# Patient Record
Sex: Male | Born: 1940
Health system: Southern US, Community
[De-identification: ages and names within clinical notes are randomized; demographics above are authoritative.]

## PROBLEM LIST (undated history)

## (undated) DIAGNOSIS — I10 Essential (primary) hypertension: Secondary | ICD-10-CM

## (undated) DIAGNOSIS — C44310 Basal cell carcinoma of skin of unspecified parts of face: Secondary | ICD-10-CM

## (undated) DIAGNOSIS — Z125 Encounter for screening for malignant neoplasm of prostate: Secondary | ICD-10-CM

## (undated) DIAGNOSIS — H409 Unspecified glaucoma: Secondary | ICD-10-CM

## (undated) DIAGNOSIS — D649 Anemia, unspecified: Secondary | ICD-10-CM

## (undated) DIAGNOSIS — I2510 Atherosclerotic heart disease of native coronary artery without angina pectoris: Secondary | ICD-10-CM

## (undated) DIAGNOSIS — N529 Male erectile dysfunction, unspecified: Secondary | ICD-10-CM

## (undated) DIAGNOSIS — Z1211 Encounter for screening for malignant neoplasm of colon: Secondary | ICD-10-CM

## (undated) DIAGNOSIS — I779 Disorder of arteries and arterioles, unspecified: Secondary | ICD-10-CM

## (undated) DIAGNOSIS — E785 Hyperlipidemia, unspecified: Secondary | ICD-10-CM

## (undated) DIAGNOSIS — C760 Malignant neoplasm of head, face and neck: Secondary | ICD-10-CM

## (undated) DIAGNOSIS — I350 Nonrheumatic aortic (valve) stenosis: Secondary | ICD-10-CM

## (undated) DIAGNOSIS — H269 Unspecified cataract: Secondary | ICD-10-CM

## (undated) DIAGNOSIS — I5189 Other ill-defined heart diseases: Secondary | ICD-10-CM

## (undated) DIAGNOSIS — E781 Pure hyperglyceridemia: Secondary | ICD-10-CM

## (undated) DIAGNOSIS — E291 Testicular hypofunction: Secondary | ICD-10-CM

## (undated) DIAGNOSIS — E119 Type 2 diabetes mellitus without complications: Secondary | ICD-10-CM

## (undated) DIAGNOSIS — E213 Hyperparathyroidism, unspecified: Secondary | ICD-10-CM

## (undated) HISTORY — DX: Hypercalcemia: E83.52

## (undated) HISTORY — DX: Unspecified cataract: H26.9

## (undated) HISTORY — DX: Atherosclerotic heart disease of native coronary artery without angina pectoris: I25.10

## (undated) HISTORY — DX: Basal cell carcinoma of skin of unspecified parts of face: C44.310

## (undated) HISTORY — DX: Hyperlipidemia, unspecified: E78.5

## (undated) HISTORY — DX: Encounter for screening for malignant neoplasm of prostate: Z12.5

## (undated) HISTORY — DX: Testicular hypofunction: E29.1

## (undated) HISTORY — DX: Other ill-defined heart diseases: I51.89

## (undated) HISTORY — DX: Anemia, unspecified: D64.9

## (undated) HISTORY — DX: Type 2 diabetes mellitus without complications: E11.9

## (undated) HISTORY — DX: Disorder of arteries and arterioles, unspecified: I77.9

## (undated) HISTORY — PX: OTHER SURGICAL HISTORY: SHX169

## (undated) HISTORY — DX: Malignant neoplasm of head, face and neck: C76.0

## (undated) HISTORY — DX: Pure hyperglyceridemia: E78.1

## (undated) HISTORY — DX: Male erectile dysfunction, unspecified: N52.9

## (undated) HISTORY — DX: Hyperparathyroidism, unspecified: E21.3

## (undated) HISTORY — DX: Encounter for screening for malignant neoplasm of colon: Z12.11

## (undated) HISTORY — DX: Nonrheumatic aortic (valve) stenosis: I35.0

## (undated) HISTORY — DX: Essential (primary) hypertension: I10

## (undated) HISTORY — DX: Unspecified glaucoma: H40.9

---

## 2006-06-12 HISTORY — PX: TRIGGER FINGER RELEASE: SHX641

## 2007-07-03 ENCOUNTER — Ambulatory Visit: Payer: Self-pay | Admitting: Family Medicine

## 2007-07-03 DIAGNOSIS — I10 Essential (primary) hypertension: Secondary | ICD-10-CM | POA: Insufficient documentation

## 2007-07-03 DIAGNOSIS — C44309 Unspecified malignant neoplasm of skin of other parts of face: Secondary | ICD-10-CM | POA: Insufficient documentation

## 2007-07-03 DIAGNOSIS — E1159 Type 2 diabetes mellitus with other circulatory complications: Secondary | ICD-10-CM | POA: Insufficient documentation

## 2007-07-03 DIAGNOSIS — I152 Hypertension secondary to endocrine disorders: Secondary | ICD-10-CM | POA: Insufficient documentation

## 2007-07-04 ENCOUNTER — Ambulatory Visit: Payer: Self-pay | Admitting: Family Medicine

## 2007-07-04 LAB — CONVERTED CEMR LAB
Blood in Urine, dipstick: NEGATIVE
Nitrite: NEGATIVE
WBC Urine, dipstick: NEGATIVE

## 2007-07-05 DIAGNOSIS — E7800 Pure hypercholesterolemia, unspecified: Secondary | ICD-10-CM | POA: Insufficient documentation

## 2007-07-05 DIAGNOSIS — E1169 Type 2 diabetes mellitus with other specified complication: Secondary | ICD-10-CM | POA: Insufficient documentation

## 2007-07-05 LAB — CONVERTED CEMR LAB
Albumin: 3.6 g/dL (ref 3.5–5.2)
Alkaline Phosphatase: 105 units/L (ref 39–117)
BUN: 11 mg/dL (ref 6–23)
Direct LDL: 121.1 mg/dL
GFR calc Af Amer: 71 mL/min
LDL Cholesterol: 113 mg/dL — ABNORMAL HIGH (ref 0–99)
PSA: 1.02 ng/mL (ref 0.10–4.00)
Potassium: 4.6 meq/L (ref 3.5–5.1)
Total CHOL/HDL Ratio: 7.2
Total Protein: 6.9 g/dL (ref 6.0–8.3)
Triglycerides: 357 mg/dL (ref 0–149)
VLDL: 71 mg/dL — ABNORMAL HIGH (ref 0–40)

## 2007-07-17 ENCOUNTER — Ambulatory Visit: Payer: Self-pay | Admitting: Family Medicine

## 2007-08-14 ENCOUNTER — Ambulatory Visit: Payer: Self-pay | Admitting: Family Medicine

## 2007-10-03 ENCOUNTER — Ambulatory Visit: Payer: Self-pay | Admitting: Family Medicine

## 2007-10-08 LAB — CONVERTED CEMR LAB
Cholesterol: 246 mg/dL (ref 0–200)
Direct LDL: 153.6 mg/dL
Triglycerides: 435 mg/dL (ref 0–149)

## 2007-10-10 ENCOUNTER — Telehealth: Payer: Self-pay | Admitting: Family Medicine

## 2008-01-07 ENCOUNTER — Ambulatory Visit: Payer: Self-pay | Admitting: Family Medicine

## 2008-01-08 LAB — CONVERTED CEMR LAB
ALT: 25 units/L (ref 0–53)
HDL: 31 mg/dL — ABNORMAL LOW (ref >39.0)
VLDL: 66 mg/dL — ABNORMAL HIGH (ref 0–40)

## 2008-02-12 ENCOUNTER — Ambulatory Visit: Payer: Self-pay | Admitting: Family Medicine

## 2008-02-21 ENCOUNTER — Ambulatory Visit: Payer: Self-pay | Admitting: Gastroenterology

## 2008-03-06 ENCOUNTER — Ambulatory Visit: Payer: Self-pay | Admitting: Gastroenterology

## 2008-03-06 ENCOUNTER — Encounter: Payer: Self-pay | Admitting: Gastroenterology

## 2008-03-06 LAB — HM COLONOSCOPY

## 2008-03-09 ENCOUNTER — Encounter: Payer: Self-pay | Admitting: Gastroenterology

## 2008-04-09 ENCOUNTER — Ambulatory Visit: Payer: Self-pay | Admitting: Family Medicine

## 2008-04-13 ENCOUNTER — Telehealth: Payer: Self-pay | Admitting: Family Medicine

## 2008-04-14 LAB — CONVERTED CEMR LAB
Direct LDL: 99.1 mg/dL
HDL: 31.9 mg/dL — ABNORMAL LOW (ref >39.0)
Total CHOL/HDL Ratio: 5.3

## 2008-07-21 ENCOUNTER — Ambulatory Visit: Payer: Self-pay | Admitting: Family Medicine

## 2008-07-23 ENCOUNTER — Ambulatory Visit: Payer: Self-pay | Admitting: Family Medicine

## 2008-07-27 ENCOUNTER — Telehealth: Payer: Self-pay | Admitting: Family Medicine

## 2008-07-30 ENCOUNTER — Encounter: Payer: Self-pay | Admitting: Family Medicine

## 2008-07-30 LAB — CONVERTED CEMR LAB
Cholesterol: 205 mg/dL (ref 0–200)
Direct LDL: 134.2 mg/dL
PSA: 0.64 ng/mL (ref 0.10–4.00)
VLDL: 44 mg/dL — ABNORMAL HIGH (ref 0–40)

## 2008-10-27 ENCOUNTER — Ambulatory Visit: Payer: Self-pay | Admitting: Family Medicine

## 2008-11-02 LAB — CONVERTED CEMR LAB
Cholesterol: 224 mg/dL — ABNORMAL HIGH (ref 0–200)
HDL: 30.6 mg/dL — ABNORMAL LOW (ref >39.00)
Triglycerides: 327 mg/dL — ABNORMAL HIGH (ref 0.0–149.0)

## 2009-02-03 ENCOUNTER — Ambulatory Visit: Payer: Self-pay | Admitting: Family Medicine

## 2009-02-06 LAB — CONVERTED CEMR LAB
ALT: 24 units/L (ref 0–53)
HDL: 31.4 mg/dL — ABNORMAL LOW (ref >39.00)
Total CHOL/HDL Ratio: 5
Triglycerides: 247 mg/dL — ABNORMAL HIGH (ref 0.0–149.0)
VLDL: 49.4 mg/dL — ABNORMAL HIGH (ref 0.0–40.0)

## 2009-02-11 ENCOUNTER — Encounter (INDEPENDENT_AMBULATORY_CARE_PROVIDER_SITE_OTHER): Payer: Self-pay | Admitting: *Deleted

## 2009-08-19 ENCOUNTER — Ambulatory Visit: Payer: Self-pay | Admitting: Family Medicine

## 2009-08-25 LAB — CONVERTED CEMR LAB
AST: 24 units/L (ref 0–37)
LDL Cholesterol: 77 mg/dL (ref 0–99)
Total CHOL/HDL Ratio: 4

## 2009-11-16 ENCOUNTER — Ambulatory Visit: Payer: Self-pay | Admitting: Family Medicine

## 2009-11-16 DIAGNOSIS — N529 Male erectile dysfunction, unspecified: Secondary | ICD-10-CM | POA: Insufficient documentation

## 2009-11-17 DIAGNOSIS — E291 Testicular hypofunction: Secondary | ICD-10-CM | POA: Insufficient documentation

## 2009-11-17 LAB — CONVERTED CEMR LAB
BUN: 19 mg/dL (ref 6–23)
Basophils Relative: 0.6 % (ref 0.0–3.0)
Calcium: 11.1 mg/dL — ABNORMAL HIGH (ref 8.4–10.5)
Creatinine, Ser: 1.4 mg/dL (ref 0.4–1.5)
Eosinophils Absolute: 0.3 10*3/uL (ref 0.0–0.7)
Eosinophils Relative: 2.9 % (ref 0.0–5.0)
Lymphocytes Relative: 25.5 % (ref 12.0–46.0)
MCHC: 34.5 g/dL (ref 30.0–36.0)
Neutrophils Relative %: 62.4 % (ref 43.0–77.0)
PSA: 0.66 ng/mL (ref 0.10–4.00)
Platelets: 308 10*3/uL (ref 150.0–400.0)
RBC: 5.04 M/uL (ref 4.22–5.81)
Sex Hormone Binding: 36 nmol/L (ref 13–71)
TSH: 0.85 microintl units/mL (ref 0.35–5.50)
Testosterone: 225.43 ng/dL — ABNORMAL LOW (ref 350–890)
WBC: 8.6 10*3/uL (ref 4.5–10.5)

## 2009-11-23 ENCOUNTER — Ambulatory Visit: Payer: Self-pay | Admitting: Family Medicine

## 2009-11-23 ENCOUNTER — Encounter: Payer: Self-pay | Admitting: Family Medicine

## 2009-11-25 DIAGNOSIS — E213 Hyperparathyroidism, unspecified: Secondary | ICD-10-CM | POA: Insufficient documentation

## 2009-11-25 LAB — CONVERTED CEMR LAB
Calcium, Total (PTH): 11.1 mg/dL — ABNORMAL HIGH (ref 8.4–10.5)
LH: 3.29 milliintl units/mL (ref 1.50–9.30)
PTH: 133.6 pg/mL — ABNORMAL HIGH (ref 14.0–72.0)
Prolactin: 6.4 ng/mL

## 2009-12-06 ENCOUNTER — Encounter: Payer: Self-pay | Admitting: Family Medicine

## 2010-02-01 ENCOUNTER — Encounter: Payer: Self-pay | Admitting: Family Medicine

## 2010-03-30 ENCOUNTER — Encounter: Payer: Self-pay | Admitting: Family Medicine

## 2010-04-04 ENCOUNTER — Ambulatory Visit: Payer: Self-pay

## 2010-06-12 HISTORY — PX: PARATHYROIDECTOMY: SHX19

## 2010-07-12 NOTE — Consult Note (Signed)
Summary: [facility] Endocrinology  [facility] Endocrinology   Imported By: Lanelle Bal 12/17/2009 11:35:14  _____________________________________________________________________  External Attachment:    Type:   Image     Comment:   External Document

## 2010-07-12 NOTE — Letter (Signed)
Summary: [facility] Endocrinology  [facility] Endocrinology   Imported By: Lanelle Bal 04/11/2010 11:40:47  _____________________________________________________________________  External Attachment:    Type:   Image     Comment:   External Document

## 2010-07-12 NOTE — Consult Note (Signed)
Summary: [facility] Endocrinology  [facility] Endocrinology   Imported By: Lanelle Bal 02/10/2010 12:16:30  _____________________________________________________________________  External Attachment:    Type:   Image     Comment:   External Document

## 2010-07-12 NOTE — Assessment & Plan Note (Signed)
Summary: ANNUAL PHYSICAL/JRR   Vital Signs:  Patient profile:   70 year old male Height:      70 inches Weight:      230.6 pounds BMI:     33.21 Temp:     98.0 degrees F oral Pulse rate:   80 / minute Pulse rhythm:   regular BP sitting:   120 / 70  (left arm) Cuff size:   large  Vitals Entered By: Benny Lennert CMA Duncan Dull) (November 16, 2009 1:58 PM)  History of Present Illness: Chief complaint Chronic Medical Issue Maintanance  Minimal exercise, moderate diet. Drinking a lot of soda. Does not like veggies.   Hypertension History:      He denies headache, chest pain, palpitations, dyspnea with exertion, peripheral edema, neurologic problems, syncope, and side effects from treatment.  Well controlled at home. Marland Kitchen        Positive major cardiovascular risk factors include male age 18 years old or older, hyperlipidemia, and hypertension.  Negative major cardiovascular risk factors include non-tobacco-user status.     Problems Prior to Update: 1)  Erectile Dysfunction, Organic  (ICD-607.84) 2)  Special Screening For Malignant Neoplasms Colon  (ICD-V76.51) 3)  Hypertriglyceridemia  (ICD-272.1) 4)  Special Screening Malignant Neoplasm of Prostate  (ICD-V76.44) 5)  Carcinoma, Basal Cell, Face  (ICD-173.3) 6)  Hypertension  (ICD-401.9)  Current Medications (verified): 1)  Fish Oil Concentrate 1000 Mg  Caps (Omega-3 Fatty Acids) .... Takes 1200 Mg. - Two Capsules By Mouth Two Times A Day 2)  Hydrochlorothiazide 25 Mg  Tabs (Hydrochlorothiazide) .... Take 1 Tablet By Mouth Once A Day 3)  Simvastatin 80 Mg Tabs (Simvastatin) .... Take 1 Tablet By Mouth Once A Day 4)  Fenofibrate 160 Mg Tabs (Fenofibrate) .... Take 1 Tablet By Mouth Once A Day  Allergies: 1)  ! Codeine  Past History:  Past medical, surgical, family and social histories (including risk factors) reviewed for relevance to current acute and chronic problems.  Past Medical History: Reviewed history from 02/12/2008 and no  changes required. HYPERTRIGLYCERIDEMIA (ICD-272.1) HYPERTENSION (ICD-401.9)    Past Surgical History: Reviewed history from 07/03/2007 and no changes required. skin cancer removed from face by Dr. Jarold Motto  Family History: Reviewed history from 07/03/2007 and no changes required. father no contact , died age 46 ? mother HTN, dementia aunt DM ? grandparents health 3 brothers HTN, chol sister HTN, chol no cancer  Social History: Reviewed history from 07/03/2007 and no changes required. Occupation:purchasing agent at [employer] Married 40 years 1 daughter healthy Former Smoker 25 pyr hx Alcohol use-yes, 1 beer every few weeks Drug use-no Regular exercise-yes, plays golf Diet; loves meat and potatos, no veggies, occ fruit  Review of Systems       Some dry mouth. General:  Denies fatigue and fever. ENT:  Area on top of right lower lip noted by hygenist.Pt already notified per report.  to have MD eval. . CV:  Denies chest pain or discomfort. Resp:  Denies shortness of breath, sputum productive, and wheezing. GI:  Denies abdominal pain, bloody stools, constipation, and diarrhea. GU:  Complains of erectile dysfunction and urinary frequency; denies decreased libido, dysuria, hematuria, nocturia, and urinary hesitancy. Derm:  Denies rash. Psych:  Denies anxiety and depression.  Physical Exam  General:  obese appearing male inNAd Eyes:  No corneal or conjunctival inflammation noted. EOMI. Perrla. Funduscopic exam benign, without hemorrhages, exudates or papilledema. Vision grossly normal. Ears:  External ear exam shows no significant lesions or deformities.  Otoscopic examination reveals clear canals, tympanic membranes are intact bilaterally without bulging, retraction, inflammation or discharge. Hearing is grossly normal bilaterally. Nose:  External nasal examination shows no deformity or inflammation. Nasal mucosa are pink and moist without lesions or exudates. Mouth:  Oral mucosa  and oropharynx without lesions or exudates.  Teeth in good repair. Neck:  no carotid bruit or thyromegaly no cervical or supraclavicular lymphadenopathy  Lungs:  Normal respiratory effort, chest expands symmetrically. Lungs are clear to auscultation, no crackles or wheezes. Heart:  Normal rate and regular rhythm. S1 and S2 normal without gallop, murmur, click, rub or other extra sounds. Abdomen:  Bowel sounds positive,abdomen soft and non-tender without masses, organomegaly or hernias noted. Rectal:  No external abnormalities noted. Normal sphincter tone. No rectal masses or tenderness. Genitalia:  Testes bilaterally descended without nodularity, tenderness or masses. No scrotal masses or lesions. No penis lesions or urethral discharge. Prostate:  Prostate gland firm and smooth, no enlargement, nodularity, tenderness, mass, asymmetry or induration. Msk:  No deformity or scoliosis noted of thoracic or lumbar spine.   Pulses:  R and L posterior tibial pulses are full and equal bilaterally  Extremities:  no edema Neurologic:  No cranial nerve deficits noted. Station and gait are normal. Plantar reflexes are down-going bilaterally. DTRs are symmetrical throughout. Sensory, motor and coordinative functions appear intact. Skin:  Intact without suspicious lesions or rashes Psych:  Cognition and judgment appear intact. Alert and cooperative with normal attention span and concentration. No apparent delusions, illusions, hallucinations   Impression & Recommendations:  Problem # 1:  ERECTILE DYSFUNCTION, ORGANIC (ICD-607.84) Will look into lab eval..no meds causing SE...consider cialis/viagra to treat.  No obvious testicular hypofunction on testicular exam.   Orders: TLB-CBC Platelet - w/Differential (85025-CBCD) TLB-TSH (Thyroid Stimulating Hormone) (84443-TSH) T-Testosterone, Free and Total [phone]) Specimen Handling (19147)  Problem # 2:  HYPERTRIGLYCERIDEMIA (ICD-272.1)  Improved on  current medicaiton. Encouraged exercise, weight loss, healthy eating habits.  His updated medication list for this problem includes:    Simvastatin 80 Mg Tabs (Simvastatin) .Marland Kitchen... Take 1 tablet by mouth once a day    Fenofibrate 160 Mg Tabs (Fenofibrate) .Marland Kitchen... Take 1 tablet by mouth once a day  Labs Reviewed: SGOT: 24 (08/19/2009)   SGPT: 26 (08/19/2009)  Prior 10 Yr Risk Heart Disease: 33 % (08/14/2007)   HDL:41.30 (08/19/2009), 31.40 (02/03/2009)  LDL:77 (08/19/2009), DEL (82/95/6213)  Chol:155 (08/19/2009), 164 (02/03/2009)  Trig:185.0 (08/19/2009), 247.0 (02/03/2009)  Problem # 3:  HYPERTENSION (ICD-401.9)  Well controlled. Continue current medication.  His updated medication list for this problem includes:    Hydrochlorothiazide 25 Mg Tabs (Hydrochlorothiazide) .Marland Kitchen... Take 1 tablet by mouth once a day  Orders: TLB-BMP (Basic Metabolic Panel-BMET) (80048-METABOL)  BP today: 120/70 Prior BP: 122/72 (07/21/2008)  Prior 10 Yr Risk Heart Disease: 33 % (08/14/2007)  Labs Reviewed: K+: 4.6 (07/04/2007) Creat: : 1.3 (07/04/2007)   Chol: 155 (08/19/2009)   HDL: 41.30 (08/19/2009)   LDL: 77 (08/19/2009)   TG: 185.0 (08/19/2009)  Problem # 4:  Preventive Health Care (ICD-V70.0) Assessment: Comment Only Reviewed preventive care protocols, scheduled due services, and updated immunizations.   Complete Medication List: 1)  Fish Oil Concentrate 1000 Mg Caps (Omega-3 fatty acids) .... Takes 1200 mg. - two capsules by mouth two times a day 2)  Hydrochlorothiazide 25 Mg Tabs (Hydrochlorothiazide) .... Take 1 tablet by mouth once a day 3)  Simvastatin 80 Mg Tabs (Simvastatin) .... Take 1 tablet by mouth once a day 4)  Fenofibrate 160  Mg Tabs (Fenofibrate) .... Take 1 tablet by mouth once a day  Other Orders: TLB-PSA (Prostate Specific Antigen) (84153-PSA) TD Toxoids IM 7 YR + (24401) Pneumococcal Vaccine (02725) Admin 1st Vaccine (36644) Admin of Any Addtl Vaccine (03474) Admin 1st  Vaccine (State) [phone]) Admin of Any Addtl Vaccine (State) (87564P)  Hypertension Assessment/Plan:      The patient's hypertensive risk group is category B: At least one risk factor (excluding diabetes) with no target organ damage.  His calculated 10 year risk of coronary heart disease is 9 %.  Today's blood pressure is 120/70.  His blood pressure goal is < 140/90.  Patient Instructions: 1)  Stop sodas and increase water intake. 2)  Decrease caffeine intake. 3)  Increase exercise and continue work on weight loss.  4)  We will call you with lab results...if they are normal..we can consider trying medication to assist with ED.  Prescriptions: SIMVASTATIN 80 MG TABS (SIMVASTATIN) Take 1 tablet by mouth once a day  #30 x 11   Entered and Authorized by:   Kerby Nora MD   Signed by:   Kerby Nora MD on 11/16/2009   Method used:   Electronically to        [facility]* (retail)       [address] [place], [place]  [address]       Ph: [phone]       Fax: [phone]   RxID:   [id number] HYDROCHLOROTHIAZIDE 25 MG  TABS (HYDROCHLOROTHIAZIDE) Take 1 tablet by mouth once a day  #30 x 11   Entered and Authorized by:   Kerby Nora MD   Signed by:   Kerby Nora MD on 11/16/2009   Method used:   Electronically to        [facility]* (retail)       [address] [place], [place]  [address]       Ph: [phone]       Fax: [phone]   RxID:   [id number]   Current Allergies (reviewed today): ! CODEINE  Flex Sig Next Due:  Not Indicated Hemoccult Next Due:  Not Indicated    Tetanus/Td Vaccine    Vaccine Type: Td    Site: right deltoid    Mfr: Sanofi Pasteur    Dose: 0.5 ml    Route: IM    Given by: Benny Lennert CMA (AAMA)    Exp. Date: 08/24/2010    Lot #: [id number]    VIS given: 04/30/07 version given November 16, 2009.  Pneumovax Vaccine    Vaccine Type: Pneumovax    Site: left deltoid    Mfr:  Merck    Dose: 0.5 ml    Route: Shorter    Given by: Benny Lennert CMA (AAMA)    Exp. Date: 04/06/2011    Lot #: [id number]    VIS given: 01/08/96 version given November 16, 2009.

## 2010-07-19 ENCOUNTER — Encounter: Payer: Self-pay | Admitting: Family Medicine

## 2010-07-27 ENCOUNTER — Encounter: Payer: Self-pay | Admitting: Family Medicine

## 2010-08-04 ENCOUNTER — Encounter: Payer: Self-pay | Admitting: Family Medicine

## 2010-08-05 ENCOUNTER — Telehealth: Payer: Self-pay | Admitting: Family Medicine

## 2010-08-09 ENCOUNTER — Encounter (INDEPENDENT_AMBULATORY_CARE_PROVIDER_SITE_OTHER): Payer: Self-pay | Admitting: *Deleted

## 2010-08-09 ENCOUNTER — Other Ambulatory Visit (INDEPENDENT_AMBULATORY_CARE_PROVIDER_SITE_OTHER): Payer: PRIVATE HEALTH INSURANCE

## 2010-08-09 ENCOUNTER — Other Ambulatory Visit: Payer: Self-pay | Admitting: Family Medicine

## 2010-08-09 DIAGNOSIS — E781 Pure hyperglyceridemia: Secondary | ICD-10-CM

## 2010-08-09 DIAGNOSIS — I10 Essential (primary) hypertension: Secondary | ICD-10-CM

## 2010-08-09 DIAGNOSIS — E785 Hyperlipidemia, unspecified: Secondary | ICD-10-CM

## 2010-08-09 LAB — HEPATIC FUNCTION PANEL
ALT: 19 U/L (ref 0–53)
Albumin: 3.9 g/dL (ref 3.5–5.2)
Bilirubin, Direct: 0.1 mg/dL (ref 0.0–0.3)
Total Protein: 6.8 g/dL (ref 6.0–8.3)

## 2010-08-09 LAB — BASIC METABOLIC PANEL
BUN: 20 mg/dL (ref 6–23)
CO2: 29 mEq/L (ref 19–32)
Calcium: 9.7 mg/dL (ref 8.4–10.5)
Chloride: 102 mEq/L (ref 96–112)
Creatinine, Ser: 1.3 mg/dL (ref 0.4–1.5)
Glucose, Bld: 129 mg/dL — ABNORMAL HIGH (ref 70–99)

## 2010-08-09 LAB — LIPID PANEL
Cholesterol: 189 mg/dL (ref 0–200)
Triglycerides: 258 mg/dL — ABNORMAL HIGH (ref 0.0–149.0)

## 2010-08-09 LAB — LDL CHOLESTEROL, DIRECT: Direct LDL: 127.4 mg/dL

## 2010-08-09 NOTE — Miscellaneous (Signed)
Summary: Fenofibrate  Clinical Lists Changes  Medications: Rx of FENOFIBRATE 160 MG TABS (FENOFIBRATE) Take 1 tablet by mouth once a day;  #30 x 6;  Signed;  Entered by: Delilah Shan CMA (AAMA);  Authorized by: Kerby Nora MD;  Method used: Electronically to [facility] [address] [place], [place], [place]  [address], Ph: [phone], Fax: [phone]    Prescriptions: FENOFIBRATE 160 MG TABS (FENOFIBRATE) Take 1 tablet by mouth once a day  #30 x 6   Entered by:   Delilah Shan CMA (AAMA)   Authorized by:   Kerby Nora MD   Signed by:   Delilah Shan CMA (AAMA) on 08/04/2010   Method used:   Electronically to        [facility]* (retail)       [address] [place], [place]  [address]       Ph: [phone]       Fax: [phone]   RxID:   [id number]

## 2010-08-18 NOTE — Consult Note (Signed)
Summary: Endocrinology/[facility]  Endocrinology/[facility]   Imported By: Sherian Rein 08/08/2010 09:07:25  _____________________________________________________________________  External Attachment:    Type:   Image     Comment:   External Document

## 2010-08-18 NOTE — Progress Notes (Signed)
Summary: ??about labs   Phone Note Call from Patient Call back at Home Phone [phone]   Caller: Patient Call For: Kerby Nora MD Summary of Call: Patient's next cpx is due in June of this year, but he is asking if he needs to come in for labs now since he hasn't had any in a while or if he should wait until right before his cpx. He says that he is concerned about his liver because of the meds that he is on. Please advise.  Initial call taken by: Melody Comas,  August 05, 2010 1:49 PM  Follow-up for Phone Call        CMET, lipids Dx 272.0 Follow-up by: Kerby Nora MD,  August 05, 2010 2:04 PM  Additional Follow-up for Phone Call Additional follow up Details #1::        Left message for patient to return my call.  Melody Comas  August 05, 2010 5:09 PM  Left message for patient to return my call.  Melody Comas  August 08, 2010 8:58 AM  Patient returmed call. Lab appt. scheduled.  Additional Follow-up by: Melody Comas,  August 08, 2010 9:08 AM

## 2010-08-18 NOTE — Letter (Signed)
Summary: [facility] Care-Oncology & Endocrine Surgery  [facility] Care-Oncology & Endocrine Surgery   Imported By: Maryln Gottron 08/08/2010 13:02:57  _____________________________________________________________________  External Attachment:    Type:   Image     Comment:   External Document

## 2010-09-06 ENCOUNTER — Encounter: Payer: Self-pay | Admitting: Family Medicine

## 2010-09-07 ENCOUNTER — Encounter: Payer: Self-pay | Admitting: Family Medicine

## 2010-09-07 ENCOUNTER — Ambulatory Visit (INDEPENDENT_AMBULATORY_CARE_PROVIDER_SITE_OTHER): Payer: PRIVATE HEALTH INSURANCE | Admitting: Family Medicine

## 2010-09-07 VITALS — BP 118/72 | HR 74 | Temp 98.3°F | Wt 228.0 lb

## 2010-09-07 DIAGNOSIS — L989 Disorder of the skin and subcutaneous tissue, unspecified: Secondary | ICD-10-CM

## 2010-09-07 NOTE — Progress Notes (Signed)
  Subjective:    Patient ID: Miguel Bradley, male    DOB: 03-Apr-1941, 70 y.o.   MRN: [id number]  HPI CC: check head  Sunday evening started feeling burning R post scalp.  Now bump has develop there, tender.  Feels like coming to a head.  Pain with combing hair.    No fever/chills, feels fine.  No nausea/vomiting.  Sleeping well at night.  H/o wart there years ago, treated with wartaway.  Resolved.  Review of Systems Per HPI    Objective:   Physical Exam  Vitals reviewed. Constitutional: He appears well-developed and well-nourished. No distress.  HENT:  Head: Normocephalic and atraumatic.  Skin: Skin is warm, dry and intact. Lesion (R posterior parietal scalp (see below)) noted.             Assessment & Plan:

## 2010-09-07 NOTE — Assessment & Plan Note (Signed)
At first thought large pustule, but able to express small amt caseous material from lesion.  Pt tolerated fine.  Covered with abx ointment. Discussed possibility of sebaceous cyst, and possible recurrence, if that occurs, to return for further evaluation. No evidence of superinfection currently so no abx prescribed.

## 2010-09-07 NOTE — Patient Instructions (Signed)
Looks like small pustule. Drained today.  Keep bandaid on for about 1 day, then dress with antibiotic ointment. If coming back, may need readdress (possible cyst). Update [place] if worsening bleeding, draining pus, or fevers or spreading redness. Call us with questions.

## 2010-11-14 ENCOUNTER — Other Ambulatory Visit (INDEPENDENT_AMBULATORY_CARE_PROVIDER_SITE_OTHER): Payer: PRIVATE HEALTH INSURANCE

## 2010-11-14 DIAGNOSIS — E781 Pure hyperglyceridemia: Secondary | ICD-10-CM

## 2010-11-14 DIAGNOSIS — N529 Male erectile dysfunction, unspecified: Secondary | ICD-10-CM

## 2010-11-14 DIAGNOSIS — E291 Testicular hypofunction: Secondary | ICD-10-CM

## 2010-11-14 DIAGNOSIS — Z125 Encounter for screening for malignant neoplasm of prostate: Secondary | ICD-10-CM

## 2010-11-14 LAB — LIPID PANEL
HDL: 35.8 mg/dL — ABNORMAL LOW (ref >39.00)
Triglycerides: 244 mg/dL — ABNORMAL HIGH (ref 0.0–149.0)

## 2010-11-14 LAB — BASIC METABOLIC PANEL
BUN: 18 mg/dL (ref 6–23)
Chloride: 101 mEq/L (ref 96–112)
Creatinine, Ser: 1.4 mg/dL (ref 0.4–1.5)

## 2010-11-14 LAB — PSA: PSA: 0.68 ng/mL (ref 0.10–4.00)

## 2010-11-23 ENCOUNTER — Other Ambulatory Visit: Payer: Self-pay

## 2010-11-25 ENCOUNTER — Ambulatory Visit (INDEPENDENT_AMBULATORY_CARE_PROVIDER_SITE_OTHER): Payer: PRIVATE HEALTH INSURANCE | Admitting: Family Medicine

## 2010-11-25 ENCOUNTER — Encounter: Payer: Self-pay | Admitting: Family Medicine

## 2010-11-25 DIAGNOSIS — E1139 Type 2 diabetes mellitus with other diabetic ophthalmic complication: Secondary | ICD-10-CM | POA: Insufficient documentation

## 2010-11-25 DIAGNOSIS — E1149 Type 2 diabetes mellitus with other diabetic neurological complication: Secondary | ICD-10-CM | POA: Insufficient documentation

## 2010-11-25 DIAGNOSIS — I10 Essential (primary) hypertension: Secondary | ICD-10-CM

## 2010-11-25 DIAGNOSIS — N289 Disorder of kidney and ureter, unspecified: Secondary | ICD-10-CM

## 2010-11-25 DIAGNOSIS — E781 Pure hyperglyceridemia: Secondary | ICD-10-CM

## 2010-11-25 DIAGNOSIS — E291 Testicular hypofunction: Secondary | ICD-10-CM

## 2010-11-25 DIAGNOSIS — E119 Type 2 diabetes mellitus without complications: Secondary | ICD-10-CM

## 2010-11-25 DIAGNOSIS — Z0000 Encounter for general adult medical examination without abnormal findings: Secondary | ICD-10-CM

## 2010-11-25 MED ORDER — VARDENAFIL HCL 20 MG PO TABS: 10.0000 mg | ORAL_TABLET | ORAL | Status: DC | PRN

## 2010-11-25 NOTE — Assessment & Plan Note (Signed)
Well controlled 

## 2010-11-25 NOTE — Progress Notes (Signed)
Subjective:    Patient ID: Miguel Bradley, male    DOB: 02-28-1941, 70 y.o.   MRN: [id number]  HPI Does not have medicare.  The patient is here for annual wellness exam and preventative care.    Hypertension:   Well controlled on HCTZ Using medication without problems or lightheadedness:  Chest pain with exertion:None Edema:None Short of breath:None Average home BPs:No  Elevated Cholesterol: LDL at goal <100 on crestor, fenofibrate  Triglycerides remain elevated. Using medications without problems: Muscle aches: None   Diabetes: NEW diagnosis, was elevated last year as well.  We need to eval further by checking A1C. Using medications without difficulties: Hypoglycemic episodes:? Hyperglycemic episodes:? Feet problems:None Blood Sugars averaging:Not checking eye exam within last year: yes  Low testoterone... Erectile issues. No fatigue. Not interested in uro referral or supplementation at this time.  Is interested in levitra prescription.    Review of Systems  Constitutional: Negative for fever, fatigue and unexpected weight change.  HENT: Negative for ear pain, congestion, sore throat, rhinorrhea, trouble swallowing and postnasal drip.   Eyes: Negative for pain.  Respiratory: Negative for cough, shortness of breath and wheezing.   Cardiovascular: Negative for chest pain, palpitations and leg swelling.  Gastrointestinal: Negative for nausea, abdominal pain, diarrhea, constipation and blood in stool.  Genitourinary: Negative for dysuria, urgency, hematuria, discharge, penile swelling, scrotal swelling, difficulty urinating, penile pain and testicular pain.  Skin: Negative for rash.  Neurological: Negative for syncope, weakness, light-headedness, numbness and headaches.  Psychiatric/Behavioral: Negative for behavioral problems and dysphoric mood. The patient is not nervous/anxious.        Objective:   Physical Exam  Constitutional: He appears well-developed and  well-nourished.  Non-toxic appearance. He does not appear ill. No distress.       Central obesity  HENT:  Head: Normocephalic and atraumatic.  Right Ear: Hearing, tympanic membrane, external ear and ear canal normal.  Left Ear: Hearing, tympanic membrane, external ear and ear canal normal.  Nose: Nose normal.  Mouth/Throat: Uvula is midline, oropharynx is clear and moist and mucous membranes are normal.  Eyes: Conjunctivae, EOM and lids are normal. Pupils are equal, round, and reactive to light. No foreign bodies found.  Neck: Trachea normal, normal range of motion and phonation normal. Neck supple. Carotid bruit is not present. No mass and no thyromegaly present.  Cardiovascular: Normal rate, regular rhythm, S1 normal, S2 normal, intact distal pulses and normal pulses.  Exam reveals no gallop.   No murmur heard. Pulmonary/Chest: Breath sounds normal. He has no wheezes. He has no rhonchi. He has no rales.  Abdominal: Soft. Normal appearance and bowel sounds are normal. There is no hepatosplenomegaly. There is no tenderness. There is no rebound, no guarding and no CVA tenderness. No hernia. Hernia confirmed negative in the right inguinal area and confirmed negative in the left inguinal area.  Genitourinary: Prostate normal, testes normal and penis normal. Rectal exam shows no external hemorrhoid, no internal hemorrhoid, no fissure, no mass, no tenderness and anal tone normal. Guaiac negative stool. Prostate is not enlarged and not tender. Right testis shows no mass and no tenderness. Left testis shows no mass and no tenderness. No paraphimosis or penile tenderness.  Lymphadenopathy:    He has no cervical adenopathy.       Right: No inguinal adenopathy present.       Left: No inguinal adenopathy present.  Neurological: He is alert. He has normal strength and normal reflexes. No cranial nerve deficit or sensory deficit.  Gait normal.  Skin: Skin is warm, dry and intact. No rash noted.  Psychiatric:  He has a normal mood and affect. His speech is normal and behavior is normal. Judgment normal.      Diabetic foot exam: Normal inspection No skin breakdown No calluses  Normal DP pulses Normal sensation to light touch and monofilament Nails normal    Assessment & Plan:  Complete Physical Exam: The patient's preventative maintenance and recommended screening tests for an annual wellness exam were reviewed in full today. Brought up to date unless services declined.  Counselled on the importance of diet, exercise, and its role in overall health and mortality. The patient's FH and SH was reviewed, including their home life, tobacco status, and drug and alcohol status.

## 2010-11-25 NOTE — Assessment & Plan Note (Signed)
Well controlled. Continue current medication.  

## 2010-11-25 NOTE — Assessment & Plan Note (Addendum)
New diagnosis of DM.  Info given and counseled on diet change.  Will eval with A1C today and check microalbumin given renal insufficiency. Refuses nutrition referral.

## 2010-11-25 NOTE — Assessment & Plan Note (Signed)
LDL now at goal on crestor. Trig remain high. Continue fenofibrate and fish oil. Make lifestyle changes.

## 2010-11-25 NOTE — Assessment & Plan Note (Signed)
Not interested in testosterone treatment at this time.

## 2010-11-25 NOTE — Patient Instructions (Addendum)
Work on starting exercise program 3-5 days a week. Work on weight loss and healthy low carb, low fat diet.. Stop soda and sweet tea, stop juice. Decrease pasta, potatos etc. See information packet.  We will call you about A1C and urine results.

## 2010-11-26 LAB — MICROALBUMIN, URINE: Microalb, Ur: 0.59 mg/dL (ref 0.00–1.89)

## 2010-12-19 ENCOUNTER — Other Ambulatory Visit: Payer: Self-pay | Admitting: *Deleted

## 2010-12-19 MED ORDER — HYDROCHLOROTHIAZIDE 25 MG PO TABS: 25.0000 mg | ORAL_TABLET | Freq: Every day | ORAL | Status: DC

## 2011-02-23 ENCOUNTER — Other Ambulatory Visit (INDEPENDENT_AMBULATORY_CARE_PROVIDER_SITE_OTHER): Payer: PRIVATE HEALTH INSURANCE

## 2011-02-23 DIAGNOSIS — E119 Type 2 diabetes mellitus without complications: Secondary | ICD-10-CM

## 2011-02-23 LAB — HM DIABETES EYE EXAM

## 2011-02-23 LAB — HEMOGLOBIN A1C: Hgb A1c MFr Bld: 6.3 % (ref 4.6–6.5)

## 2011-03-02 ENCOUNTER — Ambulatory Visit (INDEPENDENT_AMBULATORY_CARE_PROVIDER_SITE_OTHER): Payer: PRIVATE HEALTH INSURANCE | Admitting: Family Medicine

## 2011-03-02 ENCOUNTER — Encounter: Payer: Self-pay | Admitting: Family Medicine

## 2011-03-02 DIAGNOSIS — E119 Type 2 diabetes mellitus without complications: Secondary | ICD-10-CM

## 2011-03-02 DIAGNOSIS — I10 Essential (primary) hypertension: Secondary | ICD-10-CM

## 2011-03-02 NOTE — Assessment & Plan Note (Signed)
Improved control with weight loss and lifestyle changes. 

## 2011-03-02 NOTE — Patient Instructions (Addendum)
Continue great work on lifestyle changes and weight loss. Follow up in June for annual CPX and DM follow up.

## 2011-03-02 NOTE — Progress Notes (Signed)
  Subjective:    Patient ID: Miguel Bradley, male    DOB: Jun 05, 1941, 70 y.o.   MRN: [id number]  HPI   Diabetes:  Great control on diet.  Hypoglycemic episodes:Not checking Hyperglycemic episodes:Not checking Feet problems:None Blood Sugars averaging: Not checking. Has been working on diet change and weight loss and exercise (playing a lot of golf) Has lost 9 lbs in last 3 months. eye exam within last year: yes seeing every 6 months  Review of Systems  Constitutional: Negative for fever and fatigue.  Respiratory: Negative for cough and shortness of breath.   Cardiovascular: Negative for chest pain and leg swelling.  Gastrointestinal: Negative for abdominal pain and abdominal distention.  Skin: Negative for rash.       Objective:   Physical Exam  Constitutional: Vital signs are normal. He appears well-developed and well-nourished.  HENT:  Head: Normocephalic.  Right Ear: Hearing normal.  Left Ear: Hearing normal.  Nose: Nose normal.  Mouth/Throat: Oropharynx is clear and moist and mucous membranes are normal.  Neck: Trachea normal. Carotid bruit is not present. No mass and no thyromegaly present.  Cardiovascular: Normal rate, regular rhythm and normal pulses.  Exam reveals no gallop, no distant heart sounds and no friction rub.   No murmur heard.      No peripheral edema  Pulmonary/Chest: Effort normal and breath sounds normal. No respiratory distress.  Skin: Skin is warm, dry and intact. No rash noted.  Psychiatric: His speech is normal.    Diabetic foot exam: Normal inspection No skin breakdown No calluses  Normal DP pulses Normal sensation to light touch and monofilament Nails normal       Assessment & Plan:

## 2011-03-02 NOTE — Assessment & Plan Note (Signed)
Well controlled. Continue current medication.  

## 2011-03-09 ENCOUNTER — Other Ambulatory Visit: Payer: Self-pay | Admitting: *Deleted

## 2011-03-09 MED ORDER — FENOFIBRATE 160 MG PO TABS: 160.0000 mg | ORAL_TABLET | Freq: Every day | ORAL | Status: DC

## 2011-08-15 ENCOUNTER — Encounter (INDEPENDENT_AMBULATORY_CARE_PROVIDER_SITE_OTHER): Payer: PRIVATE HEALTH INSURANCE | Admitting: Ophthalmology

## 2011-08-15 DIAGNOSIS — H2510 Age-related nuclear cataract, unspecified eye: Secondary | ICD-10-CM

## 2011-08-15 DIAGNOSIS — E11319 Type 2 diabetes mellitus with unspecified diabetic retinopathy without macular edema: Secondary | ICD-10-CM

## 2011-08-15 DIAGNOSIS — H43819 Vitreous degeneration, unspecified eye: Secondary | ICD-10-CM

## 2011-08-15 DIAGNOSIS — E1139 Type 2 diabetes mellitus with other diabetic ophthalmic complication: Secondary | ICD-10-CM

## 2011-08-25 ENCOUNTER — Other Ambulatory Visit: Payer: Self-pay | Admitting: Family Medicine

## 2011-09-26 ENCOUNTER — Other Ambulatory Visit: Payer: Self-pay | Admitting: Family Medicine

## 2011-10-19 ENCOUNTER — Other Ambulatory Visit: Payer: Self-pay | Admitting: *Deleted

## 2011-10-19 MED ORDER — FENOFIBRATE 160 MG PO TABS: 160.0000 mg | ORAL_TABLET | Freq: Every day | ORAL | Status: DC

## 2011-10-30 ENCOUNTER — Other Ambulatory Visit: Payer: Self-pay | Admitting: Family Medicine

## 2011-11-21 ENCOUNTER — Telehealth: Payer: Self-pay | Admitting: Family Medicine

## 2011-11-21 DIAGNOSIS — N289 Disorder of kidney and ureter, unspecified: Secondary | ICD-10-CM

## 2011-11-21 DIAGNOSIS — E213 Hyperparathyroidism, unspecified: Secondary | ICD-10-CM

## 2011-11-21 DIAGNOSIS — E781 Pure hyperglyceridemia: Secondary | ICD-10-CM

## 2011-11-21 DIAGNOSIS — E291 Testicular hypofunction: Secondary | ICD-10-CM

## 2011-11-21 DIAGNOSIS — E119 Type 2 diabetes mellitus without complications: Secondary | ICD-10-CM

## 2011-11-21 DIAGNOSIS — I10 Essential (primary) hypertension: Secondary | ICD-10-CM

## 2011-11-21 NOTE — Telephone Encounter (Signed)
Message copied by Excell Seltzer on Tue Nov 21, 2011 10:41 AM ------      Message from: Baldomero Lamy      Created: Tue Nov 14, 2011 12:42 PM      Regarding: Cpx labs Fri /14       Please order  future cpx labs for pt's upcomming lab appt.      Thanks      Rodney Booze

## 2011-11-24 ENCOUNTER — Other Ambulatory Visit (INDEPENDENT_AMBULATORY_CARE_PROVIDER_SITE_OTHER): Payer: PRIVATE HEALTH INSURANCE

## 2011-11-24 DIAGNOSIS — I10 Essential (primary) hypertension: Secondary | ICD-10-CM

## 2011-11-24 DIAGNOSIS — E291 Testicular hypofunction: Secondary | ICD-10-CM

## 2011-11-24 DIAGNOSIS — N289 Disorder of kidney and ureter, unspecified: Secondary | ICD-10-CM

## 2011-11-24 DIAGNOSIS — E781 Pure hyperglyceridemia: Secondary | ICD-10-CM

## 2011-11-24 DIAGNOSIS — E119 Type 2 diabetes mellitus without complications: Secondary | ICD-10-CM

## 2011-11-24 DIAGNOSIS — E213 Hyperparathyroidism, unspecified: Secondary | ICD-10-CM

## 2011-11-24 LAB — COMPREHENSIVE METABOLIC PANEL
ALT: 16 U/L (ref 0–53)
AST: 21 U/L (ref 0–37)
Albumin: 3.7 g/dL (ref 3.5–5.2)
Alkaline Phosphatase: 57 U/L (ref 39–117)
Glucose, Bld: 103 mg/dL — ABNORMAL HIGH (ref 70–99)
Potassium: 4.1 mEq/L (ref 3.5–5.1)
Sodium: 139 mEq/L (ref 135–145)
Total Protein: 7 g/dL (ref 6.0–8.3)

## 2011-11-24 LAB — LIPID PANEL
LDL Cholesterol: 49 mg/dL (ref 0–99)
Total CHOL/HDL Ratio: 3
VLDL: 35 mg/dL (ref 0.0–40.0)

## 2011-11-24 LAB — HEMOGLOBIN A1C: Hgb A1c MFr Bld: 6.4 % (ref 4.6–6.5)

## 2011-11-24 LAB — TESTOSTERONE: Testosterone: 299.52 ng/dL — ABNORMAL LOW (ref 350.00–890.00)

## 2011-11-27 LAB — PARATHYROID HORMONE, INTACT (NO CA): PTH: 60 pg/mL (ref 14.0–72.0)

## 2011-12-01 ENCOUNTER — Ambulatory Visit (INDEPENDENT_AMBULATORY_CARE_PROVIDER_SITE_OTHER): Payer: PRIVATE HEALTH INSURANCE | Admitting: Family Medicine

## 2011-12-01 ENCOUNTER — Encounter: Payer: Self-pay | Admitting: Family Medicine

## 2011-12-01 VITALS — BP 120/72 | HR 63 | Temp 98.0°F | Ht 70.0 in | Wt 226.0 lb

## 2011-12-01 DIAGNOSIS — E781 Pure hyperglyceridemia: Secondary | ICD-10-CM

## 2011-12-01 DIAGNOSIS — I10 Essential (primary) hypertension: Secondary | ICD-10-CM

## 2011-12-01 DIAGNOSIS — E291 Testicular hypofunction: Secondary | ICD-10-CM

## 2011-12-01 DIAGNOSIS — E119 Type 2 diabetes mellitus without complications: Secondary | ICD-10-CM

## 2011-12-01 DIAGNOSIS — Z0000 Encounter for general adult medical examination without abnormal findings: Secondary | ICD-10-CM

## 2011-12-01 DIAGNOSIS — N289 Disorder of kidney and ureter, unspecified: Secondary | ICD-10-CM

## 2011-12-01 MED ORDER — ROSUVASTATIN CALCIUM 20 MG PO TABS: 20.0000 mg | ORAL_TABLET | Freq: Every day | ORAL | Status: DC

## 2011-12-01 NOTE — Patient Instructions (Addendum)
Avoid ibuprofen and aleve, try tylenol instead to keep kidney healthy. Look into androgel coverage.. If interested in testosterone supplementation.. Call for urology referral.  Work on healthy eating , weight loss and exercsie.  Follow up in 6 months for DM check with fasting labs prior.

## 2011-12-01 NOTE — Progress Notes (Signed)
Subjective:    Patient ID: Miguel Bradley, male    DOB: Nov 20, 1940, 71 y.o.   MRN: [id number]  HPI The patient is here for annual wellness exam and preventative care.     Hypertension:   Well controlled on current meds, HCTZ. Using medication without problems or lightheadedness: None Chest pain with exertion:None Edema:None Short of breath:None Average home BPs: not checking. Other issues:  Elevated Cholesterol: At goal LDL on crestor. Trig still alittle elevated on fenofibrate Lab Results  Component Value Date   CHOL 123 11/24/2011   HDL 38.70* 11/24/2011   LDLCALC 49 11/24/2011   LDLDIRECT 82.5 11/14/2010   TRIG 175.0* 11/24/2011   CHOLHDL 3 11/24/2011    Using medications without problems:None Muscle aches: None Diet compliance:Good.trying to decrease sugar, carbs and fat. Exercise:playing golf, otherwise no. Other complaints:  Diabetes: Well controlled on no med. Lab Results  Component Value Date   HGBA1C 6.4 11/24/2011  Hypoglycemic episodes:? Hyperglycemic episodes:? Feet problems:Good Blood Sugars averaging:not checking eye exam within last year:  Renal insufficiency: stable Hyperparathyroidism/hypercalcemia:nml pth and calcium.  . Review of Systems  Constitutional: Negative for fever, fatigue and unexpected weight change.  HENT: Negative for ear pain, congestion, sore throat, rhinorrhea, trouble swallowing and postnasal drip.   Eyes: Negative for pain.  Respiratory: Negative for cough, shortness of breath and wheezing.   Cardiovascular: Negative for chest pain, palpitations and leg swelling.  Gastrointestinal: Negative for nausea, abdominal pain, diarrhea, constipation and blood in stool.  Genitourinary: Negative for dysuria, urgency, hematuria, discharge, penile swelling, scrotal swelling, difficulty urinating, penile pain and testicular pain.  Skin: Negative for rash.  Neurological: Negative for syncope, weakness, light-headedness, numbness and headaches.    Psychiatric/Behavioral: Negative for behavioral problems and dysphoric mood. The patient is not nervous/anxious.        Objective:   Physical Exam  Constitutional: He appears well-developed and well-nourished.  Non-toxic appearance. He does not appear ill. No distress.  HENT:  Head: Normocephalic and atraumatic.  Right Ear: Hearing, tympanic membrane, external ear and ear canal normal.  Left Ear: Hearing, tympanic membrane, external ear and ear canal normal.  Nose: Nose normal.  Mouth/Throat: Uvula is midline, oropharynx is clear and moist and mucous membranes are normal.  Eyes: Conjunctivae, EOM and lids are normal. Pupils are equal, round, and reactive to light. No foreign bodies found.  Neck: Trachea normal, normal range of motion and phonation normal. Neck supple. Carotid bruit is not present. No mass and no thyromegaly present.  Cardiovascular: Normal rate, regular rhythm, S1 normal, S2 normal, intact distal pulses and normal pulses.  Exam reveals no gallop.   No murmur heard. Pulmonary/Chest: Breath sounds normal. He has no wheezes. He has no rhonchi. He has no rales.  Abdominal: Soft. Normal appearance and bowel sounds are normal. There is no hepatosplenomegaly. There is no tenderness. There is no rebound, no guarding and no CVA tenderness. No hernia.  Lymphadenopathy:    He has no cervical adenopathy.  Neurological: He is alert. He has normal strength and normal reflexes. No cranial nerve deficit or sensory deficit. Gait normal.  Skin: Skin is warm, dry and intact. No rash noted.  Psychiatric: He has a normal mood and affect. His speech is normal and behavior is normal. Judgment normal.          Assessment & Plan:  The patient's preventative maintenance and recommended screening tests for an annual wellness exam were reviewed in full today. Brought up to date unless services declined.  Counselled on the importance of diet, exercise, and its role in overall health and  mortality. The patient's FH and SH was reviewed, including their home life, tobacco status, and drug and alcohol status.   Vaccines:  PNA and Td uptodate, refuses shingles vaccine...says he has never had chicken pox Former smoker,  Colon: 2009, one polyp, repeat in 5 years, due 02/2013 Prostate: not indicated after age 90,  Lab Results  Component Value Date   PSA 0.68 11/14/2010   PSA 0.66 11/16/2009   PSA 0.64 07/23/2008

## 2011-12-13 NOTE — Assessment & Plan Note (Signed)
Stable

## 2011-12-13 NOTE — Assessment & Plan Note (Signed)
Well controlled. Continue current medication.  

## 2011-12-13 NOTE — Assessment & Plan Note (Signed)
Continued.Memory Argue re-referral to urology. Pt will consider. Feels well overall.

## 2011-12-13 NOTE — Assessment & Plan Note (Signed)
Resolved

## 2011-12-13 NOTE — Assessment & Plan Note (Signed)
Improved on crestor and fenofibrate. Not quite at goal. Encouraged exercise, weight loss, healthy eating habits. LDL at goal <100.

## 2011-12-13 NOTE — Assessment & Plan Note (Signed)
Well controlled with diet. 

## 2012-05-23 ENCOUNTER — Other Ambulatory Visit (INDEPENDENT_AMBULATORY_CARE_PROVIDER_SITE_OTHER): Payer: PRIVATE HEALTH INSURANCE

## 2012-05-23 ENCOUNTER — Telehealth: Payer: Self-pay | Admitting: Family Medicine

## 2012-05-23 DIAGNOSIS — E781 Pure hyperglyceridemia: Secondary | ICD-10-CM

## 2012-05-23 DIAGNOSIS — E119 Type 2 diabetes mellitus without complications: Secondary | ICD-10-CM

## 2012-05-23 DIAGNOSIS — N289 Disorder of kidney and ureter, unspecified: Secondary | ICD-10-CM

## 2012-05-23 LAB — LIPID PANEL
Cholesterol: 146 mg/dL (ref 0–200)
HDL: 30.8 mg/dL — ABNORMAL LOW (ref >39.00)
Triglycerides: 249 mg/dL — ABNORMAL HIGH (ref 0.0–149.0)

## 2012-05-23 LAB — COMPREHENSIVE METABOLIC PANEL
AST: 20 U/L (ref 0–37)
BUN: 17 mg/dL (ref 6–23)
Calcium: 8.9 mg/dL (ref 8.4–10.5)
Chloride: 101 mEq/L (ref 96–112)
Creatinine, Ser: 1.3 mg/dL (ref 0.4–1.5)
Glucose, Bld: 120 mg/dL — ABNORMAL HIGH (ref 70–99)

## 2012-05-23 LAB — HEMOGLOBIN A1C: Hgb A1c MFr Bld: 6.9 % — ABNORMAL HIGH (ref 4.6–6.5)

## 2012-05-23 NOTE — Telephone Encounter (Signed)
Message copied by Excell Seltzer on Thu May 23, 2012  1:35 AM ------      Message from: Baldomero Lamy      Created: Wed May 22, 2012  8:28 AM      Regarding: 6 mo f/u  labs tomorrow 05/23/12       Please order  future f/u labs for pt's upcomming lab appt.      Thanks      Rodney Booze

## 2012-05-27 ENCOUNTER — Other Ambulatory Visit: Payer: Self-pay | Admitting: *Deleted

## 2012-05-27 MED ORDER — HYDROCHLOROTHIAZIDE 25 MG PO TABS: 25.0000 mg | ORAL_TABLET | Freq: Every day | ORAL | Status: DC

## 2012-05-30 ENCOUNTER — Encounter: Payer: Self-pay | Admitting: Family Medicine

## 2012-05-30 ENCOUNTER — Ambulatory Visit (INDEPENDENT_AMBULATORY_CARE_PROVIDER_SITE_OTHER): Payer: PRIVATE HEALTH INSURANCE | Admitting: Family Medicine

## 2012-05-30 VITALS — BP 120/70 | HR 66 | Temp 98.8°F | Ht 70.0 in | Wt 228.0 lb

## 2012-05-30 DIAGNOSIS — E119 Type 2 diabetes mellitus without complications: Secondary | ICD-10-CM

## 2012-05-30 DIAGNOSIS — E781 Pure hyperglyceridemia: Secondary | ICD-10-CM

## 2012-05-30 DIAGNOSIS — I10 Essential (primary) hypertension: Secondary | ICD-10-CM

## 2012-05-30 DIAGNOSIS — N289 Disorder of kidney and ureter, unspecified: Secondary | ICD-10-CM

## 2012-05-30 NOTE — Patient Instructions (Addendum)
Make changes in diet, get back on track with low carb diet. Exercise and weight. Return for CPX in 6 month fasting labs prior.

## 2012-05-30 NOTE — Progress Notes (Signed)
  Subjective:    Patient ID: Miguel Bradley, male    DOB: Nov 02, 1940, 71 y.o.   MRN: [id number]  HPI Hypertension: Well controlled on current meds, HCTZ.  Using medication without problems or lightheadedness: None  Chest pain with exertion:None  Edema:None  Short of breath:None  Average home BPs: not checking.  Other issues:   Some weight gain Wt Readings from Last 3 Encounters:  05/30/12 228 lb (103.42 kg)  12/01/11 226 lb (102.513 kg)  03/02/11 220 lb 12.8 oz (100.154 kg)     Elevated Cholesterol: At goal LDL on crestor. Trig still alittle elevated on fenofibrate  Lab Results  Component Value Date   CHOL 146 05/23/2012   HDL 30.80* 05/23/2012   LDLCALC 49 11/24/2011   LDLDIRECT 92.0 05/23/2012   TRIG 249.0* 05/23/2012   CHOLHDL 5 05/23/2012  Using medications without problems:None  Muscle aches: None  Diet compliance:Moderate, has been snacking a lot.  Has stopped sugary drinks.  Exercise:playing golf, otherwise no.  Other complaints:   Diabetes: Well controlled on no med.  Lab Results  Component Value Date   HGBA1C 6.9* 05/23/2012   Hypoglycemic episodes:?  Hyperglycemic episodes:?  Feet problems:Good  Blood Sugars averaging:not checking  eye exam within last year: Dr. Alvester Morin, 05/2011  Renal insufficiency: stable   Hyperparathyroidism/hypercalcemia:nml pth and calcium.  .    Review of Systems  Constitutional: Negative for fever and fatigue.  HENT: Negative for congestion.   Respiratory: Negative for shortness of breath.   Cardiovascular: Negative for chest pain.  Gastrointestinal: Negative for abdominal pain.  Musculoskeletal: Negative for back pain.  Psychiatric/Behavioral: Negative for dysphoric mood.       Objective:   Physical Exam  Constitutional: Vital signs are normal. He appears well-developed and well-nourished.  HENT:  Head: Normocephalic.  Right Ear: Hearing normal.  Left Ear: Hearing normal.  Nose: Nose normal.  Mouth/Throat:  Oropharynx is clear and moist and mucous membranes are normal.  Neck: Trachea normal. Carotid bruit is not present. No mass and no thyromegaly present.  Cardiovascular: Normal rate, regular rhythm and normal pulses.  Exam reveals no gallop, no distant heart sounds and no friction rub.   No murmur heard.      No peripheral edema  Pulmonary/Chest: Effort normal and breath sounds normal. No respiratory distress.  Skin: Skin is warm, dry and intact. No rash noted.  Psychiatric: He has a normal mood and affect. His speech is normal and behavior is normal. Thought content normal.   Diabetic foot exam: Normal inspection No skin breakdown.. Some small healing cuts from nail clippers. No calluses  Normal DP pulses Normal sensation to light touch and monofilament Nails thickened, fangal infection.        Assessment & Plan:

## 2012-06-18 NOTE — Assessment & Plan Note (Signed)
Well controlled. Continue current medication.  

## 2012-06-18 NOTE — Assessment & Plan Note (Signed)
Trig remain elevated. Counseled on diet and lifestyle changes.

## 2012-06-18 NOTE — Assessment & Plan Note (Signed)
Well controlled with diet Encouraged exercise, weight loss, healthy eating habits.   

## 2012-06-18 NOTE — Assessment & Plan Note (Signed)
Stable

## 2012-06-20 ENCOUNTER — Other Ambulatory Visit: Payer: Self-pay | Admitting: *Deleted

## 2012-06-20 MED ORDER — FENOFIBRATE 160 MG PO TABS: 160.0000 mg | ORAL_TABLET | Freq: Every day | ORAL | Status: DC

## 2012-08-15 ENCOUNTER — Ambulatory Visit (INDEPENDENT_AMBULATORY_CARE_PROVIDER_SITE_OTHER): Payer: PRIVATE HEALTH INSURANCE | Admitting: Ophthalmology

## 2012-08-15 DIAGNOSIS — H35379 Puckering of macula, unspecified eye: Secondary | ICD-10-CM

## 2012-08-15 DIAGNOSIS — E11319 Type 2 diabetes mellitus with unspecified diabetic retinopathy without macular edema: Secondary | ICD-10-CM

## 2012-08-15 DIAGNOSIS — H43819 Vitreous degeneration, unspecified eye: Secondary | ICD-10-CM

## 2012-08-15 DIAGNOSIS — I10 Essential (primary) hypertension: Secondary | ICD-10-CM

## 2012-08-15 DIAGNOSIS — E1139 Type 2 diabetes mellitus with other diabetic ophthalmic complication: Secondary | ICD-10-CM

## 2012-08-15 DIAGNOSIS — H35039 Hypertensive retinopathy, unspecified eye: Secondary | ICD-10-CM

## 2012-08-15 DIAGNOSIS — H2510 Age-related nuclear cataract, unspecified eye: Secondary | ICD-10-CM

## 2012-11-22 ENCOUNTER — Telehealth: Payer: Self-pay | Admitting: Family Medicine

## 2012-11-22 DIAGNOSIS — E781 Pure hyperglyceridemia: Secondary | ICD-10-CM

## 2012-11-22 DIAGNOSIS — E119 Type 2 diabetes mellitus without complications: Secondary | ICD-10-CM

## 2012-11-22 NOTE — Telephone Encounter (Signed)
Message copied by Excell Seltzer on Fri Nov 22, 2012  1:23 AM ------      Message from: Alvina Chou      Created: Thu Nov 21, 2012  3:39 PM      Regarding: Lab orders for Tuesday, 6.17.14       Patient is scheduled for CPX labs, please order future labs, Thanks , Terri       ------

## 2012-11-26 ENCOUNTER — Other Ambulatory Visit (INDEPENDENT_AMBULATORY_CARE_PROVIDER_SITE_OTHER): Payer: Managed Care, Other (non HMO)

## 2012-11-26 DIAGNOSIS — E119 Type 2 diabetes mellitus without complications: Secondary | ICD-10-CM

## 2012-11-26 DIAGNOSIS — I10 Essential (primary) hypertension: Secondary | ICD-10-CM

## 2012-11-26 DIAGNOSIS — E781 Pure hyperglyceridemia: Secondary | ICD-10-CM

## 2012-11-26 LAB — COMPREHENSIVE METABOLIC PANEL
ALT: 14 U/L (ref 0–53)
AST: 19 U/L (ref 0–37)
Albumin: 3.6 g/dL (ref 3.5–5.2)
CO2: 23 mEq/L (ref 19–32)
Calcium: 9 mg/dL (ref 8.4–10.5)
Chloride: 106 mEq/L (ref 96–112)
GFR: 53.93 mL/min — ABNORMAL LOW (ref >60.00)
Potassium: 4.5 mEq/L (ref 3.5–5.1)
Sodium: 139 mEq/L (ref 135–145)
Total Protein: 7 g/dL (ref 6.0–8.3)

## 2012-11-26 LAB — LIPID PANEL
Total CHOL/HDL Ratio: 5
Triglycerides: 262 mg/dL — ABNORMAL HIGH (ref 0.0–149.0)

## 2012-11-27 LAB — LDL CHOLESTEROL, DIRECT: Direct LDL: 100.9 mg/dL

## 2012-11-27 LAB — MICROALBUMIN / CREATININE URINE RATIO
Creatinine,U: 246.8 mg/dL
Microalb Creat Ratio: 0.3 mg/g (ref 0.0–30.0)
Microalb, Ur: 0.7 mg/dL (ref 0.0–1.9)

## 2012-12-03 ENCOUNTER — Encounter: Payer: Self-pay | Admitting: Family Medicine

## 2012-12-03 ENCOUNTER — Ambulatory Visit (INDEPENDENT_AMBULATORY_CARE_PROVIDER_SITE_OTHER): Payer: Managed Care, Other (non HMO) | Admitting: Family Medicine

## 2012-12-03 VITALS — BP 120/60 | HR 70 | Temp 98.3°F | Ht 70.0 in | Wt 231.5 lb

## 2012-12-03 DIAGNOSIS — E119 Type 2 diabetes mellitus without complications: Secondary | ICD-10-CM

## 2012-12-03 DIAGNOSIS — I10 Essential (primary) hypertension: Secondary | ICD-10-CM

## 2012-12-03 DIAGNOSIS — Z0000 Encounter for general adult medical examination without abnormal findings: Secondary | ICD-10-CM

## 2012-12-03 DIAGNOSIS — E781 Pure hyperglyceridemia: Secondary | ICD-10-CM

## 2012-12-03 NOTE — Assessment & Plan Note (Signed)
Well controlled on diet but borderline. Encouraged exercise, weight loss, healthy eating habits.

## 2012-12-03 NOTE — Assessment & Plan Note (Signed)
Well controlled. Continue current medication.  

## 2012-12-03 NOTE — Progress Notes (Signed)
HPI  The patient is here for annual wellness exam and preventative care.  No medicare.  Hypertension: Well controlled on current meds, HCTZ.  Using medication without problems or lightheadedness: None  Chest pain with exertion:None  Edema:None  Short of breath:None  Average home BPs: not checking.  Other issues:   Elevated Cholesterol: At goal LDL on crestor. Trig still a little elevated on fenofibrate  Lab Results  Component Value Date   CHOL 161 11/26/2012   HDL 34.90* 11/26/2012   LDLCALC 49 11/24/2011   LDLDIRECT 100.9 11/26/2012   TRIG 262.0* 11/26/2012   CHOLHDL 5 11/26/2012   Using medications without problems:None  Muscle aches: None  Diet compliance:Good.trying to decrease sugar, carbs and fat.  Exercise:playing golf, otherwise no.  Other complaints:   Wt Readings from Last 3 Encounters:  12/03/12 231 lb 8 oz (105.008 kg)  05/30/12 228 lb (103.42 kg)  12/01/11 226 lb (102.513 kg)     Diabetes: Well controlled on no med.  Lab Results  Component Value Date   HGBA1C 6.9* 11/26/2012  Hypoglycemic episodes:?  Hyperglycemic episodes:?  Feet problems:Good  Blood Sugars averaging:not checking  eye exam within last year: yes  Renal insufficiency: stable  Hyperparathyroidism/hypercalcemia:nml pth and calcium.  .  Review of Systems  Constitutional: Negative for fever, fatigue and unexpected weight change.  HENT: Negative for ear pain, congestion, sore throat, rhinorrhea, trouble swallowing and postnasal drip.  Eyes: Negative for pain.  Respiratory: Negative for cough, shortness of breath and wheezing.  Cardiovascular: Negative for chest pain, palpitations and leg swelling.  Gastrointestinal: Negative for nausea, abdominal pain, diarrhea, constipation and blood in stool.  Genitourinary: Negative for dysuria, urgency, hematuria, discharge, penile swelling, scrotal swelling, difficulty urinating, penile pain and testicular pain.  Skin: Negative for rash.  Neurological:  Negative for syncope, weakness, light-headedness, numbness and headaches.  Psychiatric/Behavioral: Negative for behavioral problems and dysphoric mood. The patient is not nervous/anxious.  Objective:   Physical Exam  Constitutional: He appears well-developed and well-nourished. Non-toxic appearance. He does not appear ill. No distress.  HENT:  Head: Normocephalic and atraumatic.  Right Ear: Hearing, tympanic membrane, external ear and ear canal normal.  Left Ear: Hearing, tympanic membrane, external ear and ear canal normal.  Nose: Nose normal.  Mouth/Throat: Uvula is midline, oropharynx is clear and moist and mucous membranes are normal.  Eyes: Conjunctivae, EOM and lids are normal. Pupils are equal, round, and reactive to light. No foreign bodies found.  Neck: Trachea normal, normal range of motion and phonation normal. Neck supple. Carotid bruit is not present. No mass and no thyromegaly present.  Cardiovascular: Normal rate, regular rhythm, S1 normal, S2 normal, intact distal pulses and normal pulses. Exam reveals no gallop.  No murmur heard.  Pulmonary/Chest: Breath sounds normal. He has no wheezes. He has no rhonchi. He has no rales.  Abdominal: Soft. Normal appearance and bowel sounds are normal. There is no hepatosplenomegaly. There is no tenderness. There is no rebound, no guarding and no CVA tenderness. No hernia.  Lymphadenopathy:  He has no cervical adenopathy.  Neurological: He is alert. He has normal strength and normal reflexes. No cranial nerve deficit or sensory deficit. Gait normal.  Skin: Skin is warm, dry and intact. No rash noted.  Psychiatric: He has a normal mood and affect. His speech is normal and behavior is normal. Judgment normal.   Diabetic foot exam: Normal inspection No skin breakdown No calluses  Normal DP pulses Normal sensation to light touch and monofilament Nails  normal  Assessment & Plan:   The patient's preventative maintenance and recommended  screening tests for an annual wellness exam were reviewed in full today.  Brought up to date unless services declined.  Counselled on the importance of diet, exercise, and its role in overall health and mortality.  The patient's FH and SH was reviewed, including their home life, tobacco status, and drug and alcohol status.   Vaccines: PNA and Td uptodate, refuses shingles vaccine...says he has never had chicken pox  Former smoker.Quit 34 years ago. Colon: 2009, one polyp, repeat in 5 years, due 02/2013  Prostate: not indicated after age 2. Lab Results  Component Value Date   PSA 0.68 11/14/2010   PSA 0.66 11/16/2009   PSA 0.64 07/23/2008

## 2012-12-03 NOTE — Assessment & Plan Note (Addendum)
Well controlled LDL, triglyceride not at goal. Continue current medication.

## 2012-12-03 NOTE — Patient Instructions (Addendum)
Get back on track with exercise and weight loss. Low carbohydrate diet. Call to schedule colonoscopy  with [facility], Dr. Russella Dar.Repeat after 02/2013.

## 2013-01-13 ENCOUNTER — Encounter: Payer: Self-pay | Admitting: Gastroenterology

## 2013-02-27 ENCOUNTER — Ambulatory Visit (AMBULATORY_SURGERY_CENTER): Payer: Managed Care, Other (non HMO) | Admitting: *Deleted

## 2013-02-27 VITALS — Ht 71.0 in | Wt 236.4 lb

## 2013-02-27 DIAGNOSIS — Z86010 Personal history of colonic polyps: Secondary | ICD-10-CM

## 2013-02-27 MED ORDER — MOVIPREP 100 G PO SOLR: ORAL | Status: DC

## 2013-03-06 ENCOUNTER — Encounter: Payer: Self-pay | Admitting: Gastroenterology

## 2013-03-06 ENCOUNTER — Other Ambulatory Visit: Payer: Self-pay | Admitting: *Deleted

## 2013-03-06 MED ORDER — ROSUVASTATIN CALCIUM 20 MG PO TABS: 20.0000 mg | ORAL_TABLET | Freq: Every day | ORAL | Status: DC

## 2013-03-13 ENCOUNTER — Encounter: Payer: Self-pay | Admitting: Gastroenterology

## 2013-03-13 ENCOUNTER — Ambulatory Visit (AMBULATORY_SURGERY_CENTER): Payer: Managed Care, Other (non HMO) | Admitting: Gastroenterology

## 2013-03-13 VITALS — BP 126/63 | HR 63 | Temp 96.9°F | Resp 22 | Ht 71.0 in | Wt 236.0 lb

## 2013-03-13 DIAGNOSIS — D126 Benign neoplasm of colon, unspecified: Secondary | ICD-10-CM

## 2013-03-13 DIAGNOSIS — Z86010 Personal history of colonic polyps: Secondary | ICD-10-CM

## 2013-03-13 MED ORDER — SODIUM CHLORIDE 0.9 % IV SOLN: 500.0000 mL | INTRAVENOUS | Status: DC

## 2013-03-13 NOTE — Progress Notes (Signed)
Patient did not experience any of the following events: a burn prior to discharge; a fall within the facility; wrong site/side/patient/procedure/implant event; or a hospital transfer or hospital admission upon discharge from the facility. (G8907) Patient did not have preoperative order for IV antibiotic SSI prophylaxis. (G8918)  

## 2013-03-13 NOTE — Op Note (Signed)
[facility] [address] [place], [address]   COLONOSCOPY PROCEDURE REPORT  PATIENT: Miguel Bradley, Miguel Bradley  MR#: [id number] BIRTHDATE: 12/02/1940 , 72  yrs. old GENDER: Male ENDOSCOPIST: Meryl Dare, MD, [facility] PROCEDURE DATE:  03/13/2013 PROCEDURE:   Colonoscopy with biopsy First Screening Colonoscopy - Avg.  risk and is 50 yrs.  old or older - No.  Prior Negative Screening - Now for repeat screening. N/A  History of Adenoma - Now for follow-up colonoscopy & has been > or = to 3 yrs.  Yes hx of adenoma.  Has been 3 or more years since last colonoscopy.  Polyps Removed Today? Yes. ASA CLASS:   Class II INDICATIONS:Patient's personal history of adenomatous colon polyps.  MEDICATIONS: MAC sedation, administered by CRNA and propofol (Diprivan) 150mg  IV DESCRIPTION OF PROCEDURE:   After the risks benefits and alternatives of the procedure were thoroughly explained, informed consent was obtained.  A digital rectal exam revealed no abnormalities of the rectum.   The LB [id number]  endoscope was introduced through the anus and advanced to the cecum, which was identified by both the appendix and ileocecal valve. No adverse events experienced.   The quality of the prep was good, using MoviPrep  The instrument was then slowly withdrawn as the colon was fully examined.  COLON FINDINGS: A sessile polyp measuring 4 mm in size was found in the transverse colon.  A polypectomy was performed with cold forceps.  The resection was complete and the polyp tissue was completely retrieved.   Mild diverticulosis was noted in the descending colon and sigmoid colon.   The colon was otherwise normal.  There was no diverticulosis, inflammation, polyps or cancers unless previously stated.  Retroflexed views revealed no abnormalities. The time to cecum=1 minutes 50 seconds.  Withdrawal time=8 minutes 33 seconds.  The scope was withdrawn and the procedure completed. COMPLICATIONS:  There were no complications.  ENDOSCOPIC IMPRESSION: 1.   Sessile polyp measuring 4 mm in the transverse colon; polypectomy performed with cold forceps 2.   Mild diverticulosis was noted in the descending and sigmoid colon  RECOMMENDATIONS: 1.  Await pathology results 2.  High fiber diet with liberal fluid intake. 3.  Repeat Colonoscopy in 5 years.  eSigned:  Meryl Dare, MD, [facility] 03/13/2013 2:22 PM

## 2013-03-13 NOTE — Progress Notes (Signed)
Lidocaine-40mg IV prior to Propofol InductionPropofol given over incremental dosages 

## 2013-03-13 NOTE — Progress Notes (Signed)
Called to room to assist during endoscopic procedure.  Patient ID and intended procedure confirmed with present staff. Received instructions for my participation in the procedure from the performing physician.  

## 2013-03-13 NOTE — Patient Instructions (Signed)
YOU HAD AN ENDOSCOPIC PROCEDURE TODAY AT THE [facility]: Refer to the procedure report that was given to you for any specific questions about what was found during the examination.  If the procedure report does not answer your questions, please call your gastroenterologist to clarify.  If you requested that your care partner not be given the details of your procedure findings, then the procedure report has been included in a sealed envelope for you to review at your convenience later.  YOU SHOULD EXPECT: Some feelings of bloating in the abdomen. Passage of more gas than usual.  Walking can help get rid of the air that was put into your GI tract during the procedure and reduce the bloating. If you had a lower endoscopy (such as a colonoscopy or flexible sigmoidoscopy) you may notice spotting of blood in your stool or on the toilet paper. If you underwent a bowel prep for your procedure, then you may not have a normal bowel movement for a few days.  DIET: Your first meal following the procedure should be a light meal and then it is ok to progress to your normal diet.  A half-sandwich or bowl of soup is an example of a good first meal.  Heavy or fried foods are harder to digest and may make you feel nauseous or bloated.  Likewise meals heavy in dairy and vegetables can cause extra gas to form and this can also increase the bloating.  Drink plenty of fluids but you should avoid alcoholic beverages for 24 hours.  ACTIVITY: Your care partner should take you home directly after the procedure.  You should plan to take it easy, moving slowly for the rest of the day.  You can resume normal activity the day after the procedure however you should NOT DRIVE or use heavy machinery for 24 hours (because of the sedation medicines used during the test).    SYMPTOMS TO REPORT IMMEDIATELY: A gastroenterologist can be reached at any hour.  During normal business hours, 8:30 AM to 5:00 PM Monday through Friday,  call [phone].  After hours and on weekends, please call the GI answering service at [phone] who will take a message and have the physician on call contact you.   Following lower endoscopy (colonoscopy or flexible sigmoidoscopy):  Excessive amounts of blood in the stool  Significant tenderness or worsening of abdominal pains  Swelling of the abdomen that is new, acute  Fever of 100F or higher    FOLLOW UP: If any biopsies were taken you will be contacted by phone or by letter within the next 1-3 weeks.  Call your gastroenterologist if you have not heard about the biopsies in 3 weeks.  Our staff will call the home number listed on your records the next business day following your procedure to check on you and address any questions or concerns that you may have at that time regarding the information given to you following your procedure. This is a courtesy call and so if there is no answer at the home number and we have not heard from you through the emergency physician on call, we will assume that you have returned to your regular daily activities without incident.  SIGNATURES/CONFIDENTIALITY: You and/or your care partner have signed paperwork which will be entered into your electronic medical record.  These signatures attest to the fact that that the information above on your After Visit Summary has been reviewed and is understood.  Full responsibility of the confidentiality   of this discharge information lies with you and/or your care-partner.   Information on polyps ,diverticulosis ,& high fiber diet given to you today 

## 2013-03-14 ENCOUNTER — Telehealth: Payer: Self-pay | Admitting: *Deleted

## 2013-03-14 NOTE — Telephone Encounter (Signed)
  Follow up Call-  Call back number 03/13/2013  Post procedure Call Back phone  # [phone] hm Aske to speak with Dewayne Hatch, pt's wife  Permission to leave phone message Yes     Patient questions:  Do you have a fever, pain , or abdominal swelling? no Pain Score  0 *  Have you tolerated food without any problems? yes  Have you been able to return to your normal activities? yes  Do you have any questions about your discharge instructions: Diet   no Medications  no Follow up visit  no  Do you have questions or concerns about your Care? no  Actions: * If pain score is 4 or above: No action needed, pain <4.

## 2013-03-26 ENCOUNTER — Encounter: Payer: Self-pay | Admitting: Gastroenterology

## 2013-03-27 ENCOUNTER — Other Ambulatory Visit: Payer: Self-pay | Admitting: *Deleted

## 2013-03-27 NOTE — Telephone Encounter (Signed)
See Drug-Drug Warning.  Ok to refill? 

## 2013-03-28 MED ORDER — FENOFIBRATE 160 MG PO TABS: 160.0000 mg | ORAL_TABLET | Freq: Every day | ORAL | Status: DC

## 2013-03-28 NOTE — Telephone Encounter (Signed)
Pt called requesting status of refill and pt request cb when med refilled.

## 2013-08-15 ENCOUNTER — Ambulatory Visit (INDEPENDENT_AMBULATORY_CARE_PROVIDER_SITE_OTHER): Payer: Medicare PPO | Admitting: Ophthalmology

## 2013-08-15 DIAGNOSIS — H35379 Puckering of macula, unspecified eye: Secondary | ICD-10-CM

## 2013-08-15 DIAGNOSIS — H43819 Vitreous degeneration, unspecified eye: Secondary | ICD-10-CM

## 2013-08-15 DIAGNOSIS — E11319 Type 2 diabetes mellitus with unspecified diabetic retinopathy without macular edema: Secondary | ICD-10-CM

## 2013-08-15 DIAGNOSIS — E1165 Type 2 diabetes mellitus with hyperglycemia: Secondary | ICD-10-CM

## 2013-08-15 DIAGNOSIS — E1139 Type 2 diabetes mellitus with other diabetic ophthalmic complication: Secondary | ICD-10-CM

## 2013-08-15 DIAGNOSIS — I10 Essential (primary) hypertension: Secondary | ICD-10-CM

## 2013-08-15 DIAGNOSIS — H35039 Hypertensive retinopathy, unspecified eye: Secondary | ICD-10-CM

## 2013-10-13 ENCOUNTER — Other Ambulatory Visit: Payer: Self-pay | Admitting: Family Medicine

## 2013-10-23 ENCOUNTER — Telehealth: Payer: Self-pay | Admitting: Family Medicine

## 2013-10-23 MED ORDER — DIAZEPAM 2 MG PO TABS: 2.0000 mg | ORAL_TABLET | Freq: Three times a day (TID) | ORAL | Status: DC | PRN

## 2013-10-23 NOTE — Telephone Encounter (Signed)
Noted  

## 2013-10-23 NOTE — Telephone Encounter (Signed)
Spoke with daughter.   Patient's wife died of 32 years this AM. Grieving very hard and hard to calm down. For now prn valium    Valium 2 mg, 1 po q 8 hours prn stress or insomnia. #20, 0 refills

## 2013-10-23 NOTE — Telephone Encounter (Signed)
Cc AEB 

## 2013-10-23 NOTE — Telephone Encounter (Signed)
Pt's daughter called to inform that mother passed away this morning.  Miguel Bradley is not resting or sleeping and she is concerned for him and would like some guidance on what to do for him.  Best number to call daughter, Miguel Bradley is [phone].   Pharmacy of choice is [facility].

## 2013-10-23 NOTE — Telephone Encounter (Signed)
Called to [facility].  Miguel Bradley notified prescription has been called to [place].

## 2013-10-23 NOTE — Telephone Encounter (Signed)
To Dr Lorelei Pont who is covering

## 2013-11-14 LAB — HM DIABETES EYE EXAM

## 2013-11-24 ENCOUNTER — Other Ambulatory Visit: Payer: Self-pay | Admitting: Family Medicine

## 2013-11-28 ENCOUNTER — Other Ambulatory Visit (INDEPENDENT_AMBULATORY_CARE_PROVIDER_SITE_OTHER): Payer: Commercial Managed Care - HMO

## 2013-11-28 ENCOUNTER — Telehealth: Payer: Self-pay | Admitting: Family Medicine

## 2013-11-28 DIAGNOSIS — I10 Essential (primary) hypertension: Secondary | ICD-10-CM

## 2013-11-28 DIAGNOSIS — E781 Pure hyperglyceridemia: Secondary | ICD-10-CM

## 2013-11-28 DIAGNOSIS — E119 Type 2 diabetes mellitus without complications: Secondary | ICD-10-CM

## 2013-11-28 DIAGNOSIS — E139 Other specified diabetes mellitus without complications: Secondary | ICD-10-CM

## 2013-11-28 LAB — COMPREHENSIVE METABOLIC PANEL
ALK PHOS: 71 U/L (ref 39–117)
ALT: 20 U/L (ref 0–53)
AST: 22 U/L (ref 0–37)
Albumin: 3.8 g/dL (ref 3.5–5.2)
BUN: 19 mg/dL (ref 6–23)
CO2: 29 mEq/L (ref 19–32)
CREATININE: 1.2 mg/dL (ref 0.4–1.5)
Calcium: 9.2 mg/dL (ref 8.4–10.5)
Chloride: 105 mEq/L (ref 96–112)
GFR: 61.42 mL/min (ref >60.00)
GLUCOSE: 132 mg/dL — AB (ref 70–99)
Potassium: 4.6 mEq/L (ref 3.5–5.1)
Sodium: 139 mEq/L (ref 135–145)
Total Bilirubin: 0.9 mg/dL (ref 0.2–1.2)
Total Protein: 6.9 g/dL (ref 6.0–8.3)

## 2013-11-28 LAB — HEMOGLOBIN A1C: HEMOGLOBIN A1C: 6.8 % — AB (ref 4.6–6.5)

## 2013-11-28 LAB — LIPID PANEL
CHOLESTEROL: 147 mg/dL (ref 0–200)
HDL: 35.4 mg/dL — AB (ref >39.00)
LDL Cholesterol: 54 mg/dL (ref 0–99)
NonHDL: 111.6
TRIGLYCERIDES: 289 mg/dL — AB (ref 0.0–149.0)
Total CHOL/HDL Ratio: 4
VLDL: 57.8 mg/dL — AB (ref 0.0–40.0)

## 2013-11-28 NOTE — Telephone Encounter (Signed)
Message copied by Jinny Sanders on Fri Nov 28, 2013  2:10 PM ------      Message from: Ellamae Sia      Created: Fri Nov 21, 2013  5:22 PM      Regarding: lab orders for Friday, 6.19.15       Patient is scheduled for CPX labs, please order future labs, Thanks , Terri       ------

## 2013-11-29 ENCOUNTER — Telehealth: Payer: Self-pay | Admitting: Family Medicine

## 2013-11-29 NOTE — Telephone Encounter (Signed)
Relevant patient education mailed to patient.  

## 2013-12-05 ENCOUNTER — Encounter: Payer: Self-pay | Admitting: Family Medicine

## 2013-12-05 ENCOUNTER — Ambulatory Visit (INDEPENDENT_AMBULATORY_CARE_PROVIDER_SITE_OTHER): Payer: Commercial Managed Care - HMO | Admitting: Family Medicine

## 2013-12-05 VITALS — BP 120/70 | HR 70 | Temp 97.9°F | Ht 69.5 in | Wt 228.8 lb

## 2013-12-05 DIAGNOSIS — I10 Essential (primary) hypertension: Secondary | ICD-10-CM

## 2013-12-05 DIAGNOSIS — E781 Pure hyperglyceridemia: Secondary | ICD-10-CM

## 2013-12-05 DIAGNOSIS — Z0000 Encounter for general adult medical examination without abnormal findings: Secondary | ICD-10-CM

## 2013-12-05 DIAGNOSIS — E119 Type 2 diabetes mellitus without complications: Secondary | ICD-10-CM

## 2013-12-05 LAB — HM DIABETES FOOT EXAM

## 2013-12-05 NOTE — Progress Notes (Signed)
The patient is here for annual wellness exam and preventative care.   No red white blue medicare.  His wife passed away on Oct 25, 2013. She had a fast course of cancer 5 weeks to death from  Diagnosis.  Family is supportive.  He was married 17 years. He is going through normal grief. No depression or anxiety. He is sleeping better at night, has not needed any medicaiton.   Hypertension: Well controlled on current meds, HCTZ.  BP Readings from Last 3 Encounters:  12/05/13 120/70  03/13/13 126/63  12/03/12 120/60  Using medication without problems or lightheadedness: None  Chest pain with exertion:None  Edema:None  Short of breath:None  Average home BPs: not checking.  Other issues:   Elevated Cholesterol: At goal LDL on crestor. Trig  elevated on fenofibrate  Lab Results  Component Value Date   CHOL 147 11/28/2013   HDL 35.40* 11/28/2013   LDLCALC 54 11/28/2013   LDLDIRECT 100.9 11/26/2012   TRIG 289.0* 11/28/2013   CHOLHDL 4 11/28/2013  Using medications without problems:None  Muscle aches: None  Diet compliance:Good.trying to decrease sugar, carbs and fat.  His wife was the cook for him. Exercise:playing golf, otherwise no.  Other complaints:  Wt Readings from Last 3 Encounters:  12/05/13 228 lb 12 oz (103.76 kg)  03/13/13 236 lb (107.049 kg)  02/27/13 236 lb 6.4 oz (107.23 kg)    Diabetes: Well controlled on no med.  Lab Results  Component Value Date   HGBA1C 6.8* 11/28/2013  Hypoglycemic episodes:?  Hyperglycemic episodes:?  Feet problems:Good  Blood Sugars averaging:not checking  eye exam within last year: yes  Renal insufficiency: stable   Hyperparathyroidism/hypercalcemia:nml pth and calcium.  .  Review of Systems  Constitutional: Negative for fever, fatigue and unexpected weight change.  HENT: Negative for ear pain, congestion, sore throat, rhinorrhea, trouble swallowing and postnasal drip.  Eyes: Negative for pain.  Respiratory: Negative for cough, shortness  of breath and wheezing.  Cardiovascular: Negative for chest pain, palpitations and leg swelling.  Gastrointestinal: Negative for nausea, abdominal pain, diarrhea, constipation and blood in stool.  Genitourinary: Negative for dysuria, urgency, hematuria, discharge, penile swelling, scrotal swelling, difficulty urinating, penile pain and testicular pain.  Skin: Negative for rash.  Neurological: Negative for syncope, weakness, light-headedness, numbness and headaches.  Psychiatric/Behavioral: Negative for behavioral problems and dysphoric mood. The patient is not nervous/anxious.  Objective:   Physical Exam  Constitutional: He appears well-developed and well-nourished. Non-toxic appearance. He does not appear ill. No distress.  HENT:  Head: Normocephalic and atraumatic.  Right Ear: Hearing, tympanic membrane, external ear and ear canal normal.  Left Ear: Hearing, tympanic membrane, external ear and ear canal normal.  Nose: Nose normal.  Mouth/Throat: Uvula is midline, oropharynx is clear and moist and mucous membranes are normal.  Eyes: Conjunctivae, EOM and lids are normal. Pupils are equal, round, and reactive to light. No foreign bodies found.  Neck: Trachea normal, normal range of motion and phonation normal. Neck supple. Carotid bruit is not present. No mass and no thyromegaly present.  Cardiovascular: Normal rate, regular rhythm, S1 normal, S2 normal, intact distal pulses and normal pulses. Exam reveals no gallop.  No murmur heard.  Pulmonary/Chest: Breath sounds normal. He has no wheezes. He has no rhonchi. He has no rales.  Abdominal: Soft. Normal appearance and bowel sounds are normal. There is no hepatosplenomegaly. There is no tenderness. There is no rebound, no guarding and no CVA tenderness. No hernia.  Lymphadenopathy:  He has no cervical  adenopathy.  Neurological: He is alert. He has normal strength and normal reflexes. No cranial nerve deficit or sensory deficit. Gait normal.   Skin: Skin is warm, dry and intact. No rash noted.  Psychiatric: He has a normal mood and affect. His speech is normal and behavior is normal. Judgment normal.   Diabetic foot exam:  Normal inspection  No skin breakdown  No calluses  Normal DP pulses  Normal sensation to light touch and monofilament  Nails normal  Assessment & Plan:   The patient's preventative maintenance and recommended screening tests for an annual wellness exam were reviewed in full today.  Brought up to date unless services declined.  Counselled on the importance of diet, exercise, and its role in overall health and mortality.  The patient's FH and SH was reviewed, including their home life, tobacco status, and drug and alcohol status.   Vaccines: PNA and Td uptodate, refuses shingles vaccine...says he has never had chicken pox  Refuses prevnar. Former smoker.Quit 34 years ago.  Colon: 03/2013 Dr. Fuller Plan, plan repeat 5 years. Prostate: not indicated after age 69.

## 2013-12-05 NOTE — Assessment & Plan Note (Signed)
Stable control on no med.

## 2013-12-05 NOTE — Assessment & Plan Note (Signed)
Well controlled. Continue current medication.  

## 2013-12-05 NOTE — Patient Instructions (Signed)
Schedule 6 month follow up for diabetes with fasting labs prior. Keep working on healthy eating, exercise and weight loss.

## 2013-12-05 NOTE — Assessment & Plan Note (Signed)
Encouraged exercise, weight loss, healthy eating habits. ? ?

## 2013-12-05 NOTE — Progress Notes (Signed)
Pre visit review using our clinic review tool, if applicable. No additional management support is needed unless otherwise documented below in the visit note. 

## 2013-12-22 ENCOUNTER — Other Ambulatory Visit: Payer: Self-pay | Admitting: Family Medicine

## 2013-12-30 ENCOUNTER — Other Ambulatory Visit: Payer: Self-pay | Admitting: Family Medicine

## 2014-01-31 ENCOUNTER — Encounter: Payer: Self-pay | Admitting: Gastroenterology

## 2014-02-03 ENCOUNTER — Encounter: Payer: Self-pay | Admitting: Internal Medicine

## 2014-02-03 ENCOUNTER — Ambulatory Visit (INDEPENDENT_AMBULATORY_CARE_PROVIDER_SITE_OTHER): Payer: Commercial Managed Care - HMO | Admitting: Internal Medicine

## 2014-02-03 VITALS — BP 122/70 | HR 70 | Temp 98.2°F | Wt 228.0 lb

## 2014-02-03 DIAGNOSIS — J309 Allergic rhinitis, unspecified: Secondary | ICD-10-CM

## 2014-02-03 MED ORDER — FLUTICASONE PROPIONATE 50 MCG/ACT NA SUSP: 2.0000 | Freq: Every day | NASAL | Status: DC

## 2014-02-03 NOTE — Progress Notes (Signed)
HPI  Pt presents to the clinic today with c/o headache, nasal congestion, sore throat and cough. He reports this started 5 days ago. The cough is productive of thick yellow mucous. He is blowing blood tinged mucous our of his nose. He denies fevers but has had chills and body aches. He has tried benadryl, robitussin and delsym without much relief. He has no history of seasonal allergies or breathing problems. He has not had sick contacts that he is aware of.  Review of Systems    Past Medical History  Diagnosis Date  . Basal cell carcinoma of face   . Impotence of organic origin   . Hypercalcemia   . Hyperparathyroidism, unspecified   . Unspecified essential hypertension   . Pure hyperglyceridemia   . Special screening for malignant neoplasms, colon   . Special screening for malignant neoplasm of prostate   . Other testicular hypofunction     Family History  Problem Relation Age of Onset  . Hypertension Mother   . Dementia Mother   . Diabetes      aunt  . Hypertension Brother   . Hypertension Brother   . Hypertension Brother   . Hyperlipidemia Brother   . Hyperlipidemia Brother   . Hyperlipidemia Brother   . Hypertension Sister   . Hyperlipidemia Sister   . Colon cancer Neg Hx   . Esophageal cancer Neg Hx   . Rectal cancer Neg Hx   . Stomach cancer Neg Hx     History   Social History  . Marital Status: Married    Spouse Name: N/A    Number of Children: 1  . Years of Education: N/A   Occupational History  . 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 Smoking status: Former Smoker -- 25 years    Quit date: 06/12/1978  . Smokeless tobacco: Never Used  . Alcohol Use: Yes     Comment: 1 beer every few weeks  . Drug Use: No  . Sexual Activity: Not on file   Other Topics Concern  . Not on file   Social History Narrative   No exercise, plays golf.   Poor diet.    Allergies  Allergen Reactions  . Codeine Nausea And Vomiting      Constitutional: Positive headache, fatigue. Denies fevers or abrupt weight changes.  HEENT:  Positive nasal congestion and sore throat. Denies eye redness, ear pain, ringing in the ears, wax buildup, runny nose or bloody nose. Respiratory: Positive cough. Denies difficulty breathing or shortness of breath.  Cardiovascular: Denies chest pain, chest tightness, palpitations or swelling in the hands or feet.   No other specific complaints in a complete review of systems (except as listed in HPI above).  Objective:   BP 122/70  Pulse 70  Temp(Src) 98.2 F (36.8 C) (Oral)  Wt 228 lb (103.42 kg)  SpO2 96%  General: Appears his stated age, obese but well developed, well nourished in NAD. HEENT: Head: normal shape and size, no sinus tenderness noted; Eyes: sclera injected, no icterus, conjunctiva pink, t; Ears: Tm's gray and intact, normal light reflex; Nose: mucosa pink and moist, septum midline; Throat/Mouth: + PND. Teeth present, mucosa pink and moist, no exudate noted, no lesions or ulcerations noted.  Cardiovascular: Normal rate and rhythm. S1,S2 noted.  No murmur, rubs or gallops noted. No JVD or BLE edema. No carotid bruits noted. Pulmonary/Chest: Normal effort and positive vesicular breath sounds. No respiratory distress. No wheezes, rales or  ronchi noted.      Assessment & Plan:   Allergic Rhintiis  Can use a Neti Pot which can be purchased from your local drug store. Will start flonase and zyrtec  RTC as needed or if symptoms persist.

## 2014-02-03 NOTE — Progress Notes (Signed)
Pre visit review using our clinic review tool, if applicable. No additional management support is needed unless otherwise documented below in the visit note. 

## 2014-02-03 NOTE — Patient Instructions (Addendum)

## 2014-02-05 ENCOUNTER — Encounter: Payer: Self-pay | Admitting: Internal Medicine

## 2014-02-05 ENCOUNTER — Ambulatory Visit (INDEPENDENT_AMBULATORY_CARE_PROVIDER_SITE_OTHER): Payer: Commercial Managed Care - HMO | Admitting: Internal Medicine

## 2014-02-05 VITALS — BP 138/80 | HR 103 | Temp 98.1°F | Wt 224.0 lb

## 2014-02-05 DIAGNOSIS — J019 Acute sinusitis, unspecified: Secondary | ICD-10-CM | POA: Insufficient documentation

## 2014-02-05 DIAGNOSIS — J018 Other acute sinusitis: Secondary | ICD-10-CM

## 2014-02-05 MED ORDER — BENZONATATE 200 MG PO CAPS: 200.0000 mg | ORAL_CAPSULE | Freq: Three times a day (TID) | ORAL | Status: DC | PRN

## 2014-02-05 MED ORDER — AMOXICILLIN 500 MG PO TABS: 1000.0000 mg | ORAL_TABLET | Freq: Two times a day (BID) | ORAL | Status: DC

## 2014-02-05 NOTE — Assessment & Plan Note (Signed)
Has worsened Might have allergy as initial issue but now seems to have secondary infection Will try amoxil and benzonatate for cough

## 2014-02-05 NOTE — Progress Notes (Signed)
Pre visit review using our clinic review tool, if applicable. No additional management support is needed unless otherwise documented below in the visit note. 

## 2014-02-05 NOTE — Progress Notes (Signed)
Subjective:    Patient ID: Miguel Bradley, male    DOB: 03/31/1941, 73 y.o.   MRN: [id number]  HPI Has worsened from 2 days ago Still with sore throat Ear congestion and ear pain when he swallows Terrible cough at night--keeping him up  No fever Cough productive of tinted sputum Nasal drainage with specks of blood No headache No SOB  Has tried OTC cough meds Fluticasone and zyrtec not helping Had been taking the cetirizine regularly till recently--now things have acted up  Current Outpatient Prescriptions on File Prior to Visit  Medication Sig Dispense Refill  . Cholecalciferol (VITAMIN D) 2000 UNITS tablet Take 2,000 Units by mouth daily.        . CRESTOR 20 MG tablet TAKE 1 TABLET BY MOUTH DAILY  90 tablet  0  . fenofibrate 160 MG tablet TAKE ONE TABLET BY MOUTH EVERY DAY  30 tablet  11  . fluticasone (FLONASE) 50 MCG/ACT nasal spray Place 2 sprays into both nostrils daily.  16 g  6  . hydrochlorothiazide (HYDRODIURIL) 25 MG tablet TAKE ONE TABLET BY MOUTH EVERY DAY  30 tablet  5  . Omega-3 Fatty Acids (FISH OIL PO) Take 2 capsules by mouth daily.       No current facility-administered medications on file prior to visit.    Allergies  Allergen Reactions  . Codeine Nausea And Vomiting    Past Medical History  Diagnosis Date  . Basal cell carcinoma of face   . Impotence of organic origin   . Hypercalcemia   . Hyperparathyroidism, unspecified   . Unspecified essential hypertension   . Pure hyperglyceridemia   . Special screening for malignant neoplasms, colon   . Special screening for malignant neoplasm of prostate   . Other testicular hypofunction     Past Surgical History  Procedure Laterality Date  . Skin cancer removal      Dr. Sharlett Iles  . Parathyroidectomy  2012  . Trigger finger release Left 2008    Family History  Problem Relation Age of Onset  . Hypertension Mother   . Dementia Mother   . Diabetes      aunt  . Hypertension Brother   .  Hypertension Brother   . Hypertension Brother   . Hyperlipidemia Brother   . Hyperlipidemia Brother   . Hyperlipidemia Brother   . Hypertension Sister   . Hyperlipidemia Sister   . Colon cancer Neg Hx   . Esophageal cancer Neg Hx   . Rectal cancer Neg Hx   . Stomach cancer Neg Hx     History   Social History  . Marital Status: Married    Spouse Name: N/A    Number of Children: 1  . Years of Education: N/A   Occupational History  . 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 Smoking status: Former Smoker -- 25 years    Quit date: 06/12/1978  . Smokeless tobacco: Never Used  . Alcohol Use: Yes     Comment: 1 beer every few weeks  . Drug Use: No  . Sexual Activity: Not on file   Other Topics Concern  . Not on file   Social History Narrative   No exercise, plays golf.   Poor diet.   Review of Systems No rash No vomiting or diarrhea Appetite is off some    Objective:   Physical Exam  Constitutional: He appears well-developed. No distress.  HENT:  Mouth/Throat: Oropharynx is clear  and moist. No oropharyngeal exudate.  No sinus tenderness TMs normal Mild nasal congestion  Neck: Normal range of motion. Neck supple. No thyromegaly present.  Pulmonary/Chest: No respiratory distress. He has no wheezes.  Early inspiratory crackles at both bases--doesn't sound pathologic  Lymphadenopathy:    He has no cervical adenopathy.          Assessment & Plan:

## 2014-02-05 NOTE — Patient Instructions (Signed)
You can try over the counter dextromethorphan containing cough syrup along with the prescription benzonatate.

## 2014-04-13 ENCOUNTER — Other Ambulatory Visit: Payer: Self-pay | Admitting: Family Medicine

## 2014-06-02 ENCOUNTER — Other Ambulatory Visit (INDEPENDENT_AMBULATORY_CARE_PROVIDER_SITE_OTHER): Payer: Commercial Managed Care - HMO

## 2014-06-02 ENCOUNTER — Telehealth: Payer: Self-pay | Admitting: Family Medicine

## 2014-06-02 DIAGNOSIS — E119 Type 2 diabetes mellitus without complications: Secondary | ICD-10-CM

## 2014-06-02 LAB — COMPREHENSIVE METABOLIC PANEL
ALBUMIN: 3.6 g/dL (ref 3.5–5.2)
ALT: 24 U/L (ref 0–53)
AST: 26 U/L (ref 0–37)
Alkaline Phosphatase: 95 U/L (ref 39–117)
BUN: 16 mg/dL (ref 6–23)
CALCIUM: 8.5 mg/dL (ref 8.4–10.5)
CHLORIDE: 105 meq/L (ref 96–112)
CO2: 25 mEq/L (ref 19–32)
Creatinine, Ser: 1.2 mg/dL (ref 0.4–1.5)
GFR: 62.5 mL/min (ref >60.00)
Glucose, Bld: 140 mg/dL — ABNORMAL HIGH (ref 70–99)
POTASSIUM: 4.2 meq/L (ref 3.5–5.1)
SODIUM: 138 meq/L (ref 135–145)
TOTAL PROTEIN: 6.6 g/dL (ref 6.0–8.3)
Total Bilirubin: 0.6 mg/dL (ref 0.2–1.2)

## 2014-06-02 LAB — LIPID PANEL
Cholesterol: 151 mg/dL (ref 0–200)
HDL: 25.5 mg/dL — AB (ref >39.00)
NONHDL: 125.5
Total CHOL/HDL Ratio: 6
Triglycerides: 543 mg/dL — ABNORMAL HIGH (ref 0.0–149.0)
VLDL: 108.6 mg/dL — ABNORMAL HIGH (ref 0.0–40.0)

## 2014-06-02 LAB — HEMOGLOBIN A1C: Hgb A1c MFr Bld: 7.4 % — ABNORMAL HIGH (ref 4.6–6.5)

## 2014-06-02 LAB — LDL CHOLESTEROL, DIRECT: LDL DIRECT: 74.2 mg/dL

## 2014-06-02 NOTE — Telephone Encounter (Signed)
-----   Message from Ellamae Sia sent at 05/27/2014  3:03 PM EST ----- Regarding: Lab orders for Tuesday, 12.22.15 Labs for a 6 month f/u

## 2014-06-09 ENCOUNTER — Ambulatory Visit: Payer: Commercial Managed Care - HMO | Admitting: Family Medicine

## 2014-06-11 ENCOUNTER — Ambulatory Visit (INDEPENDENT_AMBULATORY_CARE_PROVIDER_SITE_OTHER): Payer: Commercial Managed Care - HMO | Admitting: Family Medicine

## 2014-06-11 ENCOUNTER — Ambulatory Visit: Payer: Commercial Managed Care - HMO | Admitting: Family Medicine

## 2014-06-11 ENCOUNTER — Encounter: Payer: Self-pay | Admitting: Family Medicine

## 2014-06-11 VITALS — BP 128/68 | HR 84 | Temp 98.2°F | Wt 232.8 lb

## 2014-06-11 DIAGNOSIS — N529 Male erectile dysfunction, unspecified: Secondary | ICD-10-CM

## 2014-06-11 DIAGNOSIS — N528 Other male erectile dysfunction: Secondary | ICD-10-CM

## 2014-06-11 DIAGNOSIS — E781 Pure hyperglyceridemia: Secondary | ICD-10-CM

## 2014-06-11 DIAGNOSIS — I10 Essential (primary) hypertension: Secondary | ICD-10-CM

## 2014-06-11 HISTORY — DX: Male erectile dysfunction, unspecified: N52.9

## 2014-06-11 LAB — HM DIABETES FOOT EXAM

## 2014-06-11 MED ORDER — SILDENAFIL CITRATE 100 MG PO TABS: 100.0000 mg | ORAL_TABLET | Freq: Every day | ORAL | Status: DC | PRN

## 2014-06-11 NOTE — Progress Notes (Signed)
   Subjective:    Patient ID: Miguel Bradley, male    DOB: [birth year], 72 y.o.   MRN: [id number]  HPI   73 year old male presents for 6 month follow up chronic issues.  He is requesting viagra prescription. He is now seeing an women. He is having some ED issues.  Desire is there.  Hypertension: Well controlled on current meds, HCTZ.  BP Readings from Last 3 Encounters:  06/11/14 128/68  02/05/14 138/80  02/03/14 122/70  Using medication without problems or lightheadedness: None  Chest pain with exertion:None  Edema:None  Short of breath:None  Average home BPs: not checking.  Other issues:   Elevated Cholesterol: At goal LDL on crestor. Trig elevated on fenofibrate  Lab Results  Component Value Date   CHOL 151 06/02/2014   HDL 25.50* 06/02/2014   LDLCALC 54 11/28/2013   LDLDIRECT 74.2 06/02/2014   TRIG * 06/02/2014    543.0 Triglyceride is over 400; calculations on Lipids are invalid.   CHOLHDL 6 06/02/2014  Using medications without problems:None  Muscle aches: None  Diet compliance:Not keeping to a diet. Exercise:playing golf, otherwise no.  Other complaints:  Wt Readings from Last 3 Encounters:  06/11/14 232 lb 12 oz (105.575 kg)  02/05/14 224 lb (101.606 kg)  02/03/14 228 lb (103.42 kg)      Diabetes: Worsened control on no med.  Lab Results  Component Value Date   HGBA1C 7.4* 06/02/2014  Hypoglycemic episodes:?  Hyperglycemic episodes:?  Feet problems:Good  Blood Sugars averaging:not checking  eye exam within last year: yes  Renal insufficiency: stable   Hyperparathyroidism/hypercalcemia:nml pth and calcium.  .     Review of Systems  Constitutional: Negative for fever, fatigue and unexpected weight change.  HENT: Negative for congestion, ear pain, postnasal drip, rhinorrhea, sore throat and trouble swallowing.   Eyes: Negative for pain.  Respiratory: Negative for cough, shortness of breath and wheezing.   Cardiovascular: Negative  for chest pain, palpitations and leg swelling.  Gastrointestinal: Negative for nausea, abdominal pain, diarrhea, constipation and blood in stool.  Genitourinary: Negative for dysuria, urgency, hematuria, discharge, penile swelling, scrotal swelling, difficulty urinating, penile pain and testicular pain.  Skin: Negative for rash.  Allergic/Immunologic: Positive for environmental allergies.  Neurological: Negative for syncope, weakness, light-headedness, numbness and headaches.  Psychiatric/Behavioral: Negative for behavioral problems and dysphoric mood. The patient is not nervous/anxious.        Objective:   Physical Exam  Constitutional: Vital signs are normal. He appears well-developed and well-nourished.  HENT:  Head: Normocephalic.  Right Ear: Hearing normal.  Left Ear: Hearing normal.  Nose: Nose normal.  Mouth/Throat: Oropharynx is clear and moist and mucous membranes are normal.  Neck: Trachea normal. Carotid bruit is not present. No thyroid mass and no thyromegaly present.  Cardiovascular: Normal rate, regular rhythm and normal pulses.  Exam reveals no gallop, no distant heart sounds and no friction rub.   No murmur heard. No peripheral edema  Pulmonary/Chest: Effort normal and breath sounds normal. No respiratory distress.  Skin: Skin is warm, dry and intact. No rash noted.  Psychiatric: He has a normal mood and affect. His speech is normal and behavior is normal. Thought content normal.     Diabetic foot exam: Normal inspection No skin breakdown No calluses  Normal DP pulses Normal sensation to light touch and monofilament Nails normal      Assessment & Plan:

## 2014-06-11 NOTE — Assessment & Plan Note (Signed)
Trail of viagra.

## 2014-06-11 NOTE — Patient Instructions (Addendum)
Get back on track with healthy eating, low carb diet, and weight loss.  Start exercise, goal 150 min per week.  Trial of viagra.

## 2014-06-11 NOTE — Assessment & Plan Note (Signed)
wporsened control with worsened diet. Get back on track. Encouraged exercise, weight loss, healthy eating habits.

## 2014-06-11 NOTE — Progress Notes (Signed)
Pre visit review using our clinic review tool, if applicable. No additional management support is needed unless otherwise documented below in the visit note. 

## 2014-08-17 ENCOUNTER — Ambulatory Visit (INDEPENDENT_AMBULATORY_CARE_PROVIDER_SITE_OTHER): Payer: Commercial Managed Care - HMO | Admitting: Ophthalmology

## 2014-08-17 DIAGNOSIS — H2513 Age-related nuclear cataract, bilateral: Secondary | ICD-10-CM | POA: Diagnosis not present

## 2014-08-17 DIAGNOSIS — E11329 Type 2 diabetes mellitus with mild nonproliferative diabetic retinopathy without macular edema: Secondary | ICD-10-CM

## 2014-08-17 DIAGNOSIS — H35033 Hypertensive retinopathy, bilateral: Secondary | ICD-10-CM

## 2014-08-17 DIAGNOSIS — H43813 Vitreous degeneration, bilateral: Secondary | ICD-10-CM

## 2014-08-17 DIAGNOSIS — I10 Essential (primary) hypertension: Secondary | ICD-10-CM

## 2014-08-17 DIAGNOSIS — H35372 Puckering of macula, left eye: Secondary | ICD-10-CM | POA: Diagnosis not present

## 2014-08-17 DIAGNOSIS — E11319 Type 2 diabetes mellitus with unspecified diabetic retinopathy without macular edema: Secondary | ICD-10-CM

## 2014-08-17 LAB — HM DIABETES EYE EXAM

## 2014-08-18 ENCOUNTER — Encounter: Payer: Self-pay | Admitting: Family Medicine

## 2014-09-10 ENCOUNTER — Other Ambulatory Visit: Payer: Self-pay | Admitting: Family Medicine

## 2014-11-23 ENCOUNTER — Other Ambulatory Visit: Payer: Self-pay | Admitting: Family Medicine

## 2014-11-30 ENCOUNTER — Telehealth: Payer: Self-pay | Admitting: Family Medicine

## 2014-11-30 DIAGNOSIS — E291 Testicular hypofunction: Secondary | ICD-10-CM

## 2014-11-30 DIAGNOSIS — E119 Type 2 diabetes mellitus without complications: Secondary | ICD-10-CM

## 2014-11-30 NOTE — Telephone Encounter (Signed)
-----   Message from Ellamae Sia sent at 11/23/2014  4:21 PM EDT ----- Regarding: Lab orders for , Tuesday 6.21.16 Patient is scheduled for CPX labs, please order future labs, Thanks , Karna Christmas

## 2014-12-01 ENCOUNTER — Other Ambulatory Visit (INDEPENDENT_AMBULATORY_CARE_PROVIDER_SITE_OTHER): Payer: Commercial Managed Care - HMO

## 2014-12-01 DIAGNOSIS — E119 Type 2 diabetes mellitus without complications: Secondary | ICD-10-CM

## 2014-12-01 LAB — LIPID PANEL
Cholesterol: 147 mg/dL (ref 0–200)
HDL: 33 mg/dL — AB (ref >39.00)
NONHDL: 114
Total CHOL/HDL Ratio: 4
Triglycerides: 394 mg/dL — ABNORMAL HIGH (ref 0.0–149.0)
VLDL: 78.8 mg/dL — ABNORMAL HIGH (ref 0.0–40.0)

## 2014-12-01 LAB — COMPREHENSIVE METABOLIC PANEL
ALT: 22 U/L (ref 0–53)
AST: 25 U/L (ref 0–37)
Albumin: 3.8 g/dL (ref 3.5–5.2)
Alkaline Phosphatase: 74 U/L (ref 39–117)
BILIRUBIN TOTAL: 0.6 mg/dL (ref 0.2–1.2)
BUN: 18 mg/dL (ref 6–23)
CHLORIDE: 102 meq/L (ref 96–112)
CO2: 28 meq/L (ref 19–32)
CREATININE: 1.33 mg/dL (ref 0.40–1.50)
Calcium: 9.4 mg/dL (ref 8.4–10.5)
GFR: 55.96 mL/min — AB (ref >60.00)
GLUCOSE: 141 mg/dL — AB (ref 70–99)
Potassium: 4.4 mEq/L (ref 3.5–5.1)
SODIUM: 137 meq/L (ref 135–145)
Total Protein: 6.8 g/dL (ref 6.0–8.3)

## 2014-12-01 LAB — LDL CHOLESTEROL, DIRECT: LDL DIRECT: 76 mg/dL

## 2014-12-01 LAB — HEMOGLOBIN A1C: Hgb A1c MFr Bld: 7 % — ABNORMAL HIGH (ref 4.6–6.5)

## 2014-12-08 ENCOUNTER — Ambulatory Visit (INDEPENDENT_AMBULATORY_CARE_PROVIDER_SITE_OTHER): Payer: Commercial Managed Care - HMO | Admitting: Family Medicine

## 2014-12-08 ENCOUNTER — Encounter: Payer: Self-pay | Admitting: Family Medicine

## 2014-12-08 VITALS — BP 132/78 | HR 80 | Temp 98.2°F | Ht 69.5 in | Wt 231.8 lb

## 2014-12-08 DIAGNOSIS — Z7189 Other specified counseling: Secondary | ICD-10-CM | POA: Insufficient documentation

## 2014-12-08 DIAGNOSIS — Z23 Encounter for immunization: Secondary | ICD-10-CM

## 2014-12-08 DIAGNOSIS — Z0000 Encounter for general adult medical examination without abnormal findings: Secondary | ICD-10-CM

## 2014-12-08 DIAGNOSIS — N289 Disorder of kidney and ureter, unspecified: Secondary | ICD-10-CM

## 2014-12-08 DIAGNOSIS — I10 Essential (primary) hypertension: Secondary | ICD-10-CM

## 2014-12-08 DIAGNOSIS — E119 Type 2 diabetes mellitus without complications: Secondary | ICD-10-CM

## 2014-12-08 DIAGNOSIS — E781 Pure hyperglyceridemia: Secondary | ICD-10-CM

## 2014-12-08 LAB — HM DIABETES FOOT EXAM

## 2014-12-08 NOTE — Addendum Note (Signed)
Addended by: Marchia Bond on: 12/08/2014 03:44 PM   Modules accepted: Orders, SmartSet

## 2014-12-08 NOTE — Assessment & Plan Note (Signed)
At baseline. Due for microalbumin.

## 2014-12-08 NOTE — Assessment & Plan Note (Signed)
Improved control on no med, but A1C at 7. Discussed further diet and lifestyle changes. Pt will wor k on these. Follow up in 3 months, if not improved start medication.

## 2014-12-08 NOTE — Progress Notes (Signed)
Pre visit review using our clinic review tool, if applicable. No additional management support is needed unless otherwise documented below in the visit note. 

## 2014-12-08 NOTE — Progress Notes (Signed)
The patient is here for annual wellness exam and preventative care.  I have personally reviewed the Medicare Annual Wellness questionnaire and have noted 1. The patient's medical and social history 2. Their use of alcohol, tobacco or illicit drugs 3. Their current medications and supplements 4. The patient's functional ability including ADL's, fall risks, home safety risks and hearing or visual             impairment. 5. Diet and physical activities 6. Evidence for depression or mood disorders 7.         Updated provider list Cognitive evaluation was performed and recorded on pt medicare questionnaire form. The patients weight, height, BMI and visual acuity have been recorded in the chart  I have made referrals, counseling and provided education to the patient based review of the above and I have provided the pt with a written personalized care plan for preventive services.   His wife passed away on 11/16/13. She had a fast course of cancer 5 weeks to death from Diagnosis. Family is supportive. He was married 73 years. He is going through normal grief. ression or anxiety.   Hypertension: Well controlled on current meds, HCTZ.  BP Readings from Last 3 Encounters:  12/08/14 132/78  06/11/14 128/68  02/05/14 138/80  Using medication without problems or lightheadedness: None  Chest pain with exertion: None  Edema:None  Short of breath:None  Average home BPs: not checking.  Other issues:   Elevated Cholesterol: At goal LDL on crestor and mega red krill. Trig elevated on fenofibrate  Lab Results  Component Value Date   CHOL 147 12/01/2014   HDL 33.00* 12/01/2014   LDLCALC 54 11/28/2013   LDLDIRECT 76.0 12/01/2014   TRIG 394.0* 12/01/2014   CHOLHDL 4 12/01/2014  Using medications without problems:  None Muscle aches: NOne Diet compliance:Improved eating habits,  Eating beef stew, vienna sausage.  Exercise:  None, playing some golf. Other complaints:    Wt  Readings from Last 3 Encounters:  12/08/14 231 lb 12 oz (105.121 kg)  06/11/14 232 lb 12 oz (105.575 kg)  02/05/14 224 lb (101.606 kg)   Body mass index is 33.74 kg/(m^2).    Diabetes: Improved control on no med.  Occ still having popsicles 3 at bedtime! Lab Results  Component Value Date   HGBA1C 7.0* 12/01/2014  Hypoglycemic episodes:?  Hyperglycemic episodes:?  Feet problems:Good  Blood Sugars averaging:not checking  eye exam within last year: yes  Renal insufficiency: stable   Hyperparathyroidism/hypercalcemia:nml pth and calcium.  .  Social History /Family History/Past Medical History reviewed and updated if needed.  Review of Systems  Constitutional: Negative for fever, fatigue and unexpected weight change.  HENT: Negative for ear pain, congestion, sore throat, rhinorrhea, trouble swallowing and postnasal drip.  Eyes: Negative for pain.  Respiratory: Negative for cough, shortness of breath and wheezing.  Cardiovascular: Negative for chest pain, palpitations and leg swelling.  Gastrointestinal: Negative for nausea, abdominal pain, diarrhea, constipation and blood in stool.  Genitourinary: Negative for dysuria, urgency, hematuria, discharge, penile swelling, scrotal swelling, difficulty urinating, penile pain and testicular pain.  Skin: Negative for rash.  Neurological: Negative for syncope, weakness, light-headedness, numbness and headaches.  Psychiatric/Behavioral: Negative for behavioral problems and dysphoric mood. The patient is not nervous/anxious.  Objective:   Physical Exam  Constitutional: He appears well-developed and well-nourished. Non-toxic appearance. He does not appear ill. No distress.  HENT:  Head: Normocephalic and atraumatic.  Right Ear: Hearing, tympanic membrane, external ear and ear canal normal.  Left Ear: Hearing, tympanic membrane, external ear and ear canal normal.  Nose: Nose normal.  Mouth/Throat: Uvula is midline,  oropharynx is clear and moist and mucous membranes are normal.  Eyes: Conjunctivae, EOM and lids are normal. Pupils are equal, round, and reactive to light. No foreign bodies found.  Neck: Trachea normal, normal range of motion and phonation normal. Neck supple. Carotid bruit is not present. No mass and no thyromegaly present.  Cardiovascular: Normal rate, regular rhythm, S1 normal, S2 normal, intact distal pulses and normal pulses. Exam reveals no gallop.  No murmur heard.  Pulmonary/Chest: Breath sounds normal. He has no wheezes. He has no rhonchi. He has no rales.  Abdominal: Soft. Normal appearance and bowel sounds are normal. There is no hepatosplenomegaly. There is no tenderness. There is no rebound, no guarding and no CVA tenderness. No hernia.  Lymphadenopathy:  He has no cervical adenopathy.  Neurological: He is alert. He has normal strength and normal reflexes. No cranial nerve deficit or sensory deficit. Gait normal.  Skin: Skin is warm, dry and intact. No rash noted.  Psychiatric: He has a normal mood and affect. His speech is normal and behavior is normal. Judgment normal.   Diabetic foot exam:  Normal inspection  No skin breakdown  No calluses  Normal DP pulses  Normal sensation to light touch and monofilament  Nails normal        Assessment & Plan:   The patient's preventative maintenance and recommended screening tests for an annual wellness exam were reviewed in full today.  Brought up to date unless services declined.  Counselled on the importance of diet, exercise, and its role in overall health and mortality.  The patient's FH and SH was reviewed, including their home life, tobacco status, and drug and alcohol status.   Vaccines: PNA and Td uptodate, refuses shingles vaccine...says he has never had chicken pox  Given prevnar today. Former smoker.Quit 34 years ago.  Colon: 03/2013 Dr. Fuller Plan, plan repeat 5 years. Prostate: not indicated after age  74.

## 2014-12-08 NOTE — Assessment & Plan Note (Signed)
Improved on fenofibrate and with improved diabetes control. LDL at goal On crestor.

## 2014-12-08 NOTE — Addendum Note (Signed)
Addended by: Carter Kitten on: 12/08/2014 03:16 PM   Modules accepted: Orders

## 2014-12-08 NOTE — Assessment & Plan Note (Signed)
Well controlled. Continue current medication.  

## 2014-12-08 NOTE — Patient Instructions (Addendum)
Decrease popsicles, ice cream, cookies. Decrease crackers.  Increase water intake.  Work on increase fresh veggies and fruit with peel. Increase exercise as tolerated. Stop at lab on way out for urine test.

## 2014-12-09 LAB — MICROALBUMIN / CREATININE URINE RATIO
CREATININE, U: 119 mg/dL
MICROALB UR: 1 mg/dL (ref 0.0–1.9)
Microalb Creat Ratio: 0.8 mg/g (ref 0.0–30.0)

## 2014-12-10 ENCOUNTER — Encounter: Payer: Self-pay | Admitting: *Deleted

## 2015-01-25 ENCOUNTER — Other Ambulatory Visit: Payer: Self-pay | Admitting: Family Medicine

## 2015-03-09 ENCOUNTER — Other Ambulatory Visit (INDEPENDENT_AMBULATORY_CARE_PROVIDER_SITE_OTHER): Payer: Commercial Managed Care - HMO

## 2015-03-09 ENCOUNTER — Telehealth: Payer: Self-pay | Admitting: Family Medicine

## 2015-03-09 DIAGNOSIS — E1339 Other specified diabetes mellitus with other diabetic ophthalmic complication: Secondary | ICD-10-CM

## 2015-03-09 LAB — COMPREHENSIVE METABOLIC PANEL
ALBUMIN: 3.9 g/dL (ref 3.5–5.2)
ALT: 19 U/L (ref 0–53)
AST: 19 U/L (ref 0–37)
Alkaline Phosphatase: 81 U/L (ref 39–117)
BUN: 17 mg/dL (ref 6–23)
CHLORIDE: 101 meq/L (ref 96–112)
CO2: 29 meq/L (ref 19–32)
CREATININE: 1.23 mg/dL (ref 0.40–1.50)
Calcium: 9.7 mg/dL (ref 8.4–10.5)
GFR: 61.2 mL/min (ref >60.00)
GLUCOSE: 130 mg/dL — AB (ref 70–99)
Potassium: 4.5 mEq/L (ref 3.5–5.1)
SODIUM: 139 meq/L (ref 135–145)
Total Bilirubin: 0.6 mg/dL (ref 0.2–1.2)
Total Protein: 7 g/dL (ref 6.0–8.3)

## 2015-03-09 LAB — HEMOGLOBIN A1C: HEMOGLOBIN A1C: 7.1 % — AB (ref 4.6–6.5)

## 2015-03-09 NOTE — Telephone Encounter (Signed)
-----   Message from Ellamae Sia sent at 03/01/2015 11:10 AM EDT ----- Regarding: Lab orders for Tuesday, 9.27.16 Lab orders for a 3 month follow up appt.

## 2015-03-12 ENCOUNTER — Encounter: Payer: Self-pay | Admitting: Family Medicine

## 2015-03-12 ENCOUNTER — Ambulatory Visit (INDEPENDENT_AMBULATORY_CARE_PROVIDER_SITE_OTHER): Payer: Commercial Managed Care - HMO | Admitting: Family Medicine

## 2015-03-12 VITALS — BP 128/70 | HR 60 | Temp 98.3°F | Wt 237.0 lb

## 2015-03-12 DIAGNOSIS — E08311 Diabetes mellitus due to underlying condition with unspecified diabetic retinopathy with macular edema: Secondary | ICD-10-CM | POA: Diagnosis not present

## 2015-03-12 DIAGNOSIS — I10 Essential (primary) hypertension: Secondary | ICD-10-CM | POA: Diagnosis not present

## 2015-03-12 DIAGNOSIS — E11319 Type 2 diabetes mellitus with unspecified diabetic retinopathy without macular edema: Secondary | ICD-10-CM | POA: Insufficient documentation

## 2015-03-12 DIAGNOSIS — E1339 Other specified diabetes mellitus with other diabetic ophthalmic complication: Secondary | ICD-10-CM | POA: Diagnosis not present

## 2015-03-12 NOTE — Assessment & Plan Note (Signed)
Well controlled. Continue current medication.  

## 2015-03-12 NOTE — Progress Notes (Signed)
74 year old male for 3 months follow up DM.   Hypertension: Well controlled on current meds, HCTZ.  BP Readings from Last 3 Encounters:  03/12/15 128/70  12/08/14 132/78  06/11/14 128/68  Using medication without problems or lightheadedness: None  Chest pain with exertion: None  Edema:None  Short of breath:None  Average home BPs: not checking.  Other issues:    Body mass index is 34.51 kg/(m^2).  Diabetes: Improved control on no med.   He reports he is eating late, desserts.Freida Busman, popsicles.  Lab Results  Component Value Date   HGBA1C 7.1* 03/09/2015  Hypoglycemic episodes:?  Hyperglycemic episodes:?  Feet problems:Good  Blood Sugars averaging:not checking, 130 fasting eye exam within last year: yes  Renal insufficiency: stable   Exercsie: None .  Social History /Family History/Past Medical History reviewed and updated if needed.  Review of Systems  Constitutional: Negative for fever, fatigue and unexpected weight change.  HENT: Negative for ear pain, congestion, sore throat, rhinorrhea, trouble swallowing and postnasal drip.  Eyes: Negative for pain.  Respiratory: Negative for cough, shortness of breath and wheezing.  Cardiovascular: Negative for chest pain, palpitations and leg swelling.  Gastrointestinal: Negative for nausea, abdominal pain, diarrhea, constipation and blood in stool.  Genitourinary: Negative for dysuria, urgency, hematuria, discharge, penile swelling, scrotal swelling, difficulty urinating, penile pain and testicular pain.  Skin: Negative for rash.  Neurological: Negative for syncope, weakness, light-headedness, numbness and headaches.  Psychiatric/Behavioral: Negative for behavioral problems and dysphoric mood. The patient is not nervous/anxious.  Objective:   Physical Exam  Constitutional: He appears well-developed and well-nourished. Non-toxic appearance. He does not appear ill. No distress.  HENT:  Head:  Normocephalic and atraumatic.  Right Ear: Hearing, tympanic membrane, external ear and ear canal normal.  Left Ear: Hearing, tympanic membrane, external ear and ear canal normal.  Nose: Nose normal.  Mouth/Throat: Uvula is midline, oropharynx is clear and moist and mucous membranes are normal.  Eyes: Conjunctivae, EOM and lids are normal. Pupils are equal, round, and reactive to light. No foreign bodies found.  Neck: Trachea normal, normal range of motion and phonation normal. Neck supple. Carotid bruit is not present. No mass and no thyromegaly present.  Cardiovascular: Normal rate, regular rhythm, S1 normal, S2 normal, intact distal pulses and normal pulses. Exam reveals no gallop.  No murmur heard.  Pulmonary/Chest: Breath sounds normal. He has no wheezes. He has no rhonchi. He has no rales.  He has no cervical adenopathy.  Neurological: He is alert. He has normal strength and normal reflexes. No cranial nerve deficit or sensory deficit. Gait normal.  Skin: Skin is warm, dry and intact. No rash noted.  Psychiatric: He has a normal mood and affect. His speech is normal and behavior is normal. Judgment normal.   Diabetic foot exam:  Normal inspection  No skin breakdown  No calluses  Normal DP pulses  Normal sensation to light touch and monofilament  Nails normal

## 2015-03-12 NOTE — Assessment & Plan Note (Signed)
Counseled on diet changes to lower sugar. Increase exercise.

## 2015-03-12 NOTE — Progress Notes (Signed)
Pre visit review using our clinic review tool, if applicable. No additional management support is needed unless otherwise documented below in the visit note. 

## 2015-03-12 NOTE — Patient Instructions (Addendum)
Switch OJ to tomato juice in morning. Increase water during the day. Try popcorn at night for snack instead of sweets and candy. Get back to regular exercise. Look into [employer] in winter.  Start aspirin 81 mg for heart prevention.

## 2015-03-24 ENCOUNTER — Other Ambulatory Visit: Payer: Self-pay | Admitting: Family Medicine

## 2015-04-26 ENCOUNTER — Other Ambulatory Visit: Payer: Self-pay | Admitting: Family Medicine

## 2015-04-26 ENCOUNTER — Telehealth: Payer: Self-pay | Admitting: Family Medicine

## 2015-04-26 NOTE — Telephone Encounter (Signed)
Pt called about history of tetanus injection. 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 He is requesting call back with information.   CB # [phone]

## 2015-04-26 NOTE — Telephone Encounter (Signed)
Immunization record mailed to patient's home address as requested.

## 2015-06-03 ENCOUNTER — Telehealth: Payer: Self-pay | Admitting: Family Medicine

## 2015-06-03 DIAGNOSIS — E781 Pure hyperglyceridemia: Secondary | ICD-10-CM

## 2015-06-03 DIAGNOSIS — E1339 Other specified diabetes mellitus with other diabetic ophthalmic complication: Secondary | ICD-10-CM

## 2015-06-03 DIAGNOSIS — E291 Testicular hypofunction: Secondary | ICD-10-CM

## 2015-06-03 NOTE — Telephone Encounter (Signed)
-----   Message from Ellamae Sia sent at 05/27/2015 10:18 AM EST ----- Regarding: Lab orders for Friday, 12.23.16 Lab orders for a 3 month follow up appt.

## 2015-06-04 ENCOUNTER — Other Ambulatory Visit (INDEPENDENT_AMBULATORY_CARE_PROVIDER_SITE_OTHER): Payer: Commercial Managed Care - HMO

## 2015-06-04 DIAGNOSIS — E1339 Other specified diabetes mellitus with other diabetic ophthalmic complication: Secondary | ICD-10-CM

## 2015-06-04 DIAGNOSIS — E781 Pure hyperglyceridemia: Secondary | ICD-10-CM

## 2015-06-04 LAB — COMPREHENSIVE METABOLIC PANEL
ALBUMIN: 4 g/dL (ref 3.5–5.2)
ALK PHOS: 67 U/L (ref 39–117)
ALT: 15 U/L (ref 0–53)
AST: 16 U/L (ref 0–37)
BILIRUBIN TOTAL: 0.7 mg/dL (ref 0.2–1.2)
BUN: 23 mg/dL (ref 6–23)
CO2: 29 mEq/L (ref 19–32)
Calcium: 9.6 mg/dL (ref 8.4–10.5)
Chloride: 100 mEq/L (ref 96–112)
Creatinine, Ser: 1.34 mg/dL (ref 0.40–1.50)
GFR: 55.4 mL/min — ABNORMAL LOW (ref >60.00)
GLUCOSE: 137 mg/dL — AB (ref 70–99)
POTASSIUM: 4.1 meq/L (ref 3.5–5.1)
SODIUM: 137 meq/L (ref 135–145)
TOTAL PROTEIN: 7 g/dL (ref 6.0–8.3)

## 2015-06-04 LAB — LIPID PANEL
CHOLESTEROL: 145 mg/dL (ref 0–200)
HDL: 35.9 mg/dL — ABNORMAL LOW (ref >39.00)
NonHDL: 109.56
Total CHOL/HDL Ratio: 4
Triglycerides: 267 mg/dL — ABNORMAL HIGH (ref 0.0–149.0)
VLDL: 53.4 mg/dL — AB (ref 0.0–40.0)

## 2015-06-04 LAB — HEMOGLOBIN A1C: HEMOGLOBIN A1C: 7 % — AB (ref 4.6–6.5)

## 2015-06-04 LAB — LDL CHOLESTEROL, DIRECT: LDL DIRECT: 62 mg/dL

## 2015-06-11 ENCOUNTER — Ambulatory Visit (INDEPENDENT_AMBULATORY_CARE_PROVIDER_SITE_OTHER): Payer: Commercial Managed Care - HMO | Admitting: Family Medicine

## 2015-06-11 ENCOUNTER — Encounter: Payer: Self-pay | Admitting: Family Medicine

## 2015-06-11 VITALS — BP 120/74 | HR 55 | Temp 98.6°F | Ht 68.75 in | Wt 231.8 lb

## 2015-06-11 DIAGNOSIS — E781 Pure hyperglyceridemia: Secondary | ICD-10-CM

## 2015-06-11 DIAGNOSIS — E1339 Other specified diabetes mellitus with other diabetic ophthalmic complication: Secondary | ICD-10-CM

## 2015-06-11 DIAGNOSIS — I10 Essential (primary) hypertension: Secondary | ICD-10-CM

## 2015-06-11 LAB — HM DIABETES FOOT EXAM

## 2015-06-11 NOTE — Progress Notes (Signed)
Pre visit review using our clinic review tool, if applicable. No additional management support is needed unless otherwise documented below in the visit note. 

## 2015-06-11 NOTE — Progress Notes (Signed)
74 year old male for 3 months follow up DM.   Hypertension: Well controlled on current meds, HCTZ.  BP Readings from Last 3 Encounters:  06/11/15 120/74  03/12/15 128/70  12/08/14 132/78  Using medication without problems or lightheadedness: None  Chest pain with exertion: None  Edema:None  Short of breath:None  Average home BPs: not checking.  Other issues:   Diabetes: Improved control on no med.   He reports he is eating late, desserts.Miguel Bradley, popsicles.  Lab Results  Component Value Date   HGBA1C 7.0* 06/04/2015  Hypoglycemic episodes:?  Hyperglycemic episodes:?  Feet problems:Good  Blood Sugars averaging:not checking eye exam within last year: yes  Renal insufficiency: stable   He has stopped late night snacking and eating less sugar.  Elevated Cholesterol:  LDL at goal on crestor 20 mg daily. Trigs remain high despite fenofibrate and lifestyle changes. Lab Results  Component Value Date   CHOL 145 06/04/2015   HDL 35.90* 06/04/2015   LDLCALC 54 11/28/2013   LDLDIRECT 62.0 06/04/2015   TRIG 267.0* 06/04/2015   CHOLHDL 4 06/04/2015  Using medications without problems:None Muscle aches: None Diet compliance: Improving Exercise: walking some Other complaints:   Exercsie: None Wt Readings from Last 3 Encounters:  06/11/15 231 lb 12 oz (105.121 kg)  03/12/15 237 lb (107.502 kg)  12/08/14 231 lb 12 oz (105.121 kg)   He has lost 6 lbs in last 3 motnhs.  Body mass index is 34.48 kg/(m^2).   He has been having clearing of throat, post nasal drip. No cold, no fever. No SOB.  Tried guafenesin.  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   .  Social History /Family History/Past Medical History reviewed and updated if needed.  Review of Systems  Constitutional: Negative for fever, fatigue and unexpected weight change.  HENT: Negative for ear pain, congestion, sore throat, rhinorrhea, trouble swallowing and postnasal drip.   Eyes: Negative for pain.  Respiratory: Negative for cough, shortness of breath and wheezing.  Cardiovascular: Negative for chest pain, palpitations and leg swelling.  Gastrointestinal: Negative for nausea, abdominal pain, diarrhea, constipation and blood in stool.  Genitourinary: Negative for dysuria, urgency, hematuria, discharge, penile swelling, scrotal swelling, difficulty urinating, penile pain and testicular pain.  Skin: Negative for rash.  Neurological: Negative for syncope, weakness, light-headedness, numbness and headaches.  Psychiatric/Behavioral: Negative for behavioral problems and dysphoric mood. The patient is not nervous/anxious.  Objective:   Physical Exam  Constitutional: He appears well-developed and well-nourished. Non-toxic appearance. He does not appear ill. No distress.  HENT:  Head: Normocephalic and atraumatic.  Right Ear: Hearing, tympanic membrane, external ear and ear canal normal.  Left Ear: Hearing, tympanic membrane, external ear and ear canal normal.  Nose: Nose normal.  Mouth/Throat: Uvula is midline, oropharynx is clear and moist and mucous membranes are normal.  Eyes: Conjunctivae, EOM and lids are normal. Pupils are equal, round, and reactive to light. No foreign bodies found.  Neck: Trachea normal, normal range of motion and phonation normal. Neck supple. Carotid bruit is not present. No mass and no thyromegaly present.  Cardiovascular: Normal rate, regular rhythm, S1 normal, S2 normal, intact distal pulses and normal pulses. Exam reveals no gallop.  No murmur heard.  Pulmonary/Chest: Breath sounds normal. He has no wheezes. He has no rhonchi. He has no rales.  He has no cervical adenopathy.  Neurological: He is alert. He has normal strength and normal reflexes. No cranial nerve deficit or sensory deficit. Gait normal.  Skin: Skin is warm, dry and intact. No rash noted.  Psychiatric: He has a normal mood and affect. His speech is  normal and behavior is normal. Judgment normal.   Diabetic foot exam:  Normal inspection  No skin breakdown  No calluses  Normal DP pulses  Normal sensation to light touch and monofilament  Nails normal

## 2015-06-11 NOTE — Patient Instructions (Addendum)
Try generic allergy med like cetrizine (zyrtec).  Keep working on weight loss and low sugar diet. Try to start regular exercsie 3-5  week.

## 2015-06-11 NOTE — Assessment & Plan Note (Signed)
Much improved on low sugar diet. Not yet at goal. Continue fenofibrate. LDL at goal on crestor.

## 2015-06-11 NOTE — Assessment & Plan Note (Signed)
Well controlled. Continue current medication.  

## 2015-06-11 NOTE — Assessment & Plan Note (Signed)
Improving on low sugar diet. Pt hesitant about medication. Will continue to work aggressively on diet and lifestyle changes.

## 2015-08-18 ENCOUNTER — Ambulatory Visit (INDEPENDENT_AMBULATORY_CARE_PROVIDER_SITE_OTHER): Payer: Commercial Managed Care - HMO | Admitting: Ophthalmology

## 2015-08-18 DIAGNOSIS — I10 Essential (primary) hypertension: Secondary | ICD-10-CM | POA: Diagnosis not present

## 2015-08-18 DIAGNOSIS — H43813 Vitreous degeneration, bilateral: Secondary | ICD-10-CM

## 2015-08-18 DIAGNOSIS — E11311 Type 2 diabetes mellitus with unspecified diabetic retinopathy with macular edema: Secondary | ICD-10-CM

## 2015-08-18 DIAGNOSIS — H35372 Puckering of macula, left eye: Secondary | ICD-10-CM

## 2015-08-18 DIAGNOSIS — H35033 Hypertensive retinopathy, bilateral: Secondary | ICD-10-CM | POA: Diagnosis not present

## 2015-08-18 DIAGNOSIS — E113293 Type 2 diabetes mellitus with mild nonproliferative diabetic retinopathy without macular edema, bilateral: Secondary | ICD-10-CM | POA: Diagnosis not present

## 2015-08-18 DIAGNOSIS — H2511 Age-related nuclear cataract, right eye: Secondary | ICD-10-CM

## 2015-08-18 LAB — HM DIABETES EYE EXAM

## 2015-08-19 ENCOUNTER — Encounter: Payer: Self-pay | Admitting: Family Medicine

## 2015-09-02 ENCOUNTER — Telehealth: Payer: Self-pay | Admitting: Family Medicine

## 2015-09-02 DIAGNOSIS — E781 Pure hyperglyceridemia: Secondary | ICD-10-CM

## 2015-09-02 DIAGNOSIS — E1339 Other specified diabetes mellitus with other diabetic ophthalmic complication: Secondary | ICD-10-CM

## 2015-09-02 NOTE — Telephone Encounter (Signed)
-----   Message from Ellamae Sia sent at 08/25/2015 11:39 AM EDT ----- Regarding: Lab orders for Friday, 3.24.17 Lab orders for a 3 month follow up appt.

## 2015-09-03 ENCOUNTER — Other Ambulatory Visit (INDEPENDENT_AMBULATORY_CARE_PROVIDER_SITE_OTHER): Payer: Commercial Managed Care - HMO

## 2015-09-03 DIAGNOSIS — E781 Pure hyperglyceridemia: Secondary | ICD-10-CM

## 2015-09-03 DIAGNOSIS — E1339 Other specified diabetes mellitus with other diabetic ophthalmic complication: Secondary | ICD-10-CM

## 2015-09-03 LAB — COMPREHENSIVE METABOLIC PANEL
ALK PHOS: 67 U/L (ref 39–117)
ALT: 16 U/L (ref 0–53)
AST: 17 U/L (ref 0–37)
Albumin: 3.9 g/dL (ref 3.5–5.2)
BUN: 25 mg/dL — ABNORMAL HIGH (ref 6–23)
CALCIUM: 9.2 mg/dL (ref 8.4–10.5)
CO2: 29 meq/L (ref 19–32)
Chloride: 103 mEq/L (ref 96–112)
Creatinine, Ser: 1.26 mg/dL (ref 0.40–1.50)
GFR: 59.44 mL/min — AB (ref >60.00)
GLUCOSE: 143 mg/dL — AB (ref 70–99)
POTASSIUM: 4.4 meq/L (ref 3.5–5.1)
Sodium: 139 mEq/L (ref 135–145)
Total Bilirubin: 0.5 mg/dL (ref 0.2–1.2)
Total Protein: 7.1 g/dL (ref 6.0–8.3)

## 2015-09-03 LAB — LIPID PANEL
Cholesterol: 142 mg/dL (ref 0–200)
HDL: 33.1 mg/dL — AB (ref >39.00)
NonHDL: 108.54
TRIGLYCERIDES: 302 mg/dL — AB (ref 0.0–149.0)
Total CHOL/HDL Ratio: 4
VLDL: 60.4 mg/dL — AB (ref 0.0–40.0)

## 2015-09-03 LAB — LDL CHOLESTEROL, DIRECT: Direct LDL: 129 mg/dL

## 2015-09-03 LAB — HEMOGLOBIN A1C: HEMOGLOBIN A1C: 7.4 % — AB (ref 4.6–6.5)

## 2015-09-10 ENCOUNTER — Ambulatory Visit (INDEPENDENT_AMBULATORY_CARE_PROVIDER_SITE_OTHER): Payer: Commercial Managed Care - HMO | Admitting: Family Medicine

## 2015-09-10 ENCOUNTER — Encounter: Payer: Self-pay | Admitting: Family Medicine

## 2015-09-10 VITALS — BP 110/66 | HR 64 | Temp 98.5°F | Ht 68.75 in | Wt 232.0 lb

## 2015-09-10 DIAGNOSIS — E7800 Pure hypercholesterolemia, unspecified: Secondary | ICD-10-CM | POA: Diagnosis not present

## 2015-09-10 DIAGNOSIS — N289 Disorder of kidney and ureter, unspecified: Secondary | ICD-10-CM | POA: Diagnosis not present

## 2015-09-10 DIAGNOSIS — E1339 Other specified diabetes mellitus with other diabetic ophthalmic complication: Secondary | ICD-10-CM | POA: Diagnosis not present

## 2015-09-10 DIAGNOSIS — I10 Essential (primary) hypertension: Secondary | ICD-10-CM

## 2015-09-10 LAB — HM DIABETES FOOT EXAM

## 2015-09-10 NOTE — Assessment & Plan Note (Signed)
No longer at goal on crestor and fenofibrate.  Get back on track with diet.. Stop animal fats.

## 2015-09-10 NOTE — Assessment & Plan Note (Signed)
Well controlled. Continue current medication.  

## 2015-09-10 NOTE — Progress Notes (Signed)
75 year old male for 3 months follow up DM.   Hypertension: Well controlled on current meds, HCTZ.  BP Readings from Last 3 Encounters:  09/10/15 110/66  06/11/15 120/74  03/12/15 128/70  Using medication without problems or lightheadedness: None  Chest pain with exertion: None  Edema:None  Short of breath:None  Average home BPs: not checking.  Other issues:   Diabetes: Inadequate control on no med.   He reports he is eating late, desserts.. Eating a lot of jelly beans.  Lab Results  Component Value Date   HGBA1C 7.4* 09/03/2015  Hypoglycemic episodes:?  Hyperglycemic episodes:?  Feet problems:Good  Blood Sugars averaging:not checking eye exam within last year: yes  Renal insufficiency: stable  He has stopped late night snacking and eating less sugar.  Elevated Cholesterol: LDL NO LONGER at goal on crestor 20 mg daily.  AlsoTrigs remain high despite fenofibrate and lifestyle changes. He has been eating more hamburgers and hotdog, has 5 lbs homemade sausage, spam. 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 .  Social History /Family History/Past Medical History reviewed and updated if needed.  Review of Systems  Constitutional: Negative for fever, fatigue and unexpected weight change.  HENT: Negative for ear pain, congestion, sore throat, rhinorrhea, trouble swallowing and postnasal drip.  Eyes: Negative for pain.  Respiratory: Negative for cough, shortness of breath and wheezing.   Cardiovascular: Negative for chest pain, palpitations and leg swelling.  Gastrointestinal: Negative for nausea, abdominal pain, diarrhea, constipation and blood in stool.  Genitourinary: Negative for dysuria, urgency, hematuria, discharge, penile swelling, scrotal swelling, difficulty urinating, penile pain and testicular pain.  Skin: Negative for rash.  Neurological: Negative for syncope, weakness, light-headedness, numbness and headaches.  Psychiatric/Behavioral: Negative for behavioral problems and dysphoric mood. The patient is not nervous/anxious.  Objective:   Physical Exam  Constitutional: He appears well-developed and well-nourished. Non-toxic appearance. He does not appear ill. No distress.  HENT:  Head: Normocephalic and atraumatic.  Right Ear: Hearing, tympanic membrane, external ear and ear canal normal.  Left Ear: Hearing, tympanic membrane, external ear and ear canal normal.  Nose: Nose normal.  Mouth/Throat: Uvula is midline, oropharynx is clear and moist and mucous membranes are normal.  Eyes: Conjunctivae, EOM and lids are normal. Pupils are equal, round, and reactive to light. No foreign bodies found.  Neck: Trachea normal, normal range of motion and phonation normal. Neck supple. Carotid bruit is not present. No mass and no thyromegaly present.  Cardiovascular: Normal rate, regular rhythm, S1 normal, S2 normal, intact distal pulses and normal pulses. Exam reveals no gallop.  No murmur heard.  Pulmonary/Chest: Breath sounds normal. He has no wheezes. He has no rhonchi. He has no rales.  He has no cervical adenopathy.  Neurological: He is alert. He has normal strength and normal reflexes. No cranial nerve deficit or sensory deficit. Gait normal.  Skin: Skin is warm, dry and intact. No rash noted.  Psychiatric: He has a normal mood and affect. His speech is normal and behavior is normal.  Judgment normal.   Diabetic foot exam:  Normal inspection  No  skin breakdown  No calluses  Normal DP pulses  Normal sensation to light touch and monofilament  Nails normal

## 2015-09-10 NOTE — Assessment & Plan Note (Signed)
Stable control. Likely due to DM.

## 2015-09-10 NOTE — Assessment & Plan Note (Signed)
Work on diet changes and start exercise. Reviewed in detail.  If not at goal next OV will start metformin.

## 2015-09-10 NOTE — Patient Instructions (Signed)
Stop Jelly beans and candy.  Get back to chicken instead of burgers and hot dogs. Try chicken or [place] dogs. Start daily walking 3-5 times a week.

## 2015-09-10 NOTE — Progress Notes (Signed)
Pre visit review using our clinic review tool, if applicable. No additional management support is needed unless otherwise documented below in the visit note. 

## 2015-11-24 ENCOUNTER — Other Ambulatory Visit: Payer: Self-pay | Admitting: Family Medicine

## 2015-11-26 ENCOUNTER — Telehealth: Payer: Self-pay | Admitting: Family Medicine

## 2015-11-26 NOTE — Telephone Encounter (Signed)
Pt aware.

## 2015-11-26 NOTE — Telephone Encounter (Signed)
LM for pt to see if an AWV could be scheduled at 2:45 on 6/29 immediately after CPE with Dr. Diona Browner at 2pm on 6/29, [place]

## 2015-12-07 ENCOUNTER — Other Ambulatory Visit (INDEPENDENT_AMBULATORY_CARE_PROVIDER_SITE_OTHER): Payer: Commercial Managed Care - HMO

## 2015-12-07 ENCOUNTER — Telehealth: Payer: Self-pay | Admitting: Family Medicine

## 2015-12-07 DIAGNOSIS — E1339 Other specified diabetes mellitus with other diabetic ophthalmic complication: Secondary | ICD-10-CM

## 2015-12-07 DIAGNOSIS — E7800 Pure hypercholesterolemia, unspecified: Secondary | ICD-10-CM

## 2015-12-07 LAB — COMPREHENSIVE METABOLIC PANEL
ALT: 17 U/L (ref 0–53)
AST: 17 U/L (ref 0–37)
Albumin: 4 g/dL (ref 3.5–5.2)
Alkaline Phosphatase: 84 U/L (ref 39–117)
BUN: 17 mg/dL (ref 6–23)
CHLORIDE: 102 meq/L (ref 96–112)
CO2: 31 mEq/L (ref 19–32)
Calcium: 9.1 mg/dL (ref 8.4–10.5)
Creatinine, Ser: 1.13 mg/dL (ref 0.40–1.50)
GFR: 67.35 mL/min (ref >60.00)
GLUCOSE: 157 mg/dL — AB (ref 70–99)
POTASSIUM: 4.5 meq/L (ref 3.5–5.1)
SODIUM: 138 meq/L (ref 135–145)
Total Bilirubin: 0.5 mg/dL (ref 0.2–1.2)
Total Protein: 7.1 g/dL (ref 6.0–8.3)

## 2015-12-07 LAB — LIPID PANEL
CHOL/HDL RATIO: 4
Cholesterol: 140 mg/dL (ref 0–200)
HDL: 32.3 mg/dL — ABNORMAL LOW (ref >39.00)
NONHDL: 107.68
TRIGLYCERIDES: 244 mg/dL — AB (ref 0.0–149.0)
VLDL: 48.8 mg/dL — ABNORMAL HIGH (ref 0.0–40.0)

## 2015-12-07 LAB — LDL CHOLESTEROL, DIRECT: LDL DIRECT: 85 mg/dL

## 2015-12-07 LAB — HEMOGLOBIN A1C: Hgb A1c MFr Bld: 7.1 % — ABNORMAL HIGH (ref 4.6–6.5)

## 2015-12-07 NOTE — Telephone Encounter (Signed)
-----   Message from Ellamae Sia sent at 11/30/2015  2:25 PM EDT ----- Regarding: Lab orders for Tuesday, 6.27.17 Patient is scheduled for CPX labs, please order future labs, Thanks , Karna Christmas

## 2015-12-09 ENCOUNTER — Encounter: Payer: Self-pay | Admitting: Family Medicine

## 2015-12-09 ENCOUNTER — Ambulatory Visit (INDEPENDENT_AMBULATORY_CARE_PROVIDER_SITE_OTHER): Payer: Commercial Managed Care - HMO | Admitting: Family Medicine

## 2015-12-09 ENCOUNTER — Ambulatory Visit: Payer: Commercial Managed Care - HMO

## 2015-12-09 VITALS — BP 120/74 | HR 84 | Temp 98.4°F | Ht 69.5 in | Wt 230.2 lb

## 2015-12-09 DIAGNOSIS — E7800 Pure hypercholesterolemia, unspecified: Secondary | ICD-10-CM

## 2015-12-09 DIAGNOSIS — Z0000 Encounter for general adult medical examination without abnormal findings: Secondary | ICD-10-CM | POA: Diagnosis not present

## 2015-12-09 DIAGNOSIS — E1339 Other specified diabetes mellitus with other diabetic ophthalmic complication: Secondary | ICD-10-CM | POA: Diagnosis not present

## 2015-12-09 DIAGNOSIS — I10 Essential (primary) hypertension: Secondary | ICD-10-CM

## 2015-12-09 LAB — MICROALBUMIN / CREATININE URINE RATIO
Creatinine,U: 161 mg/dL
MICROALB/CREAT RATIO: 1 mg/g (ref 0.0–30.0)
Microalb, Ur: 1.6 mg/dL (ref 0.0–1.9)

## 2015-12-09 NOTE — Assessment & Plan Note (Signed)
Work aggressively on low carbohydrate low sugar diet.  If A1C not at goal < 7 in 3 months.. We will start a medication to treat diabetes.

## 2015-12-09 NOTE — Assessment & Plan Note (Signed)
Well controlled. Continue current medication. Encouraged exercise, weight loss, healthy eating habits.  

## 2015-12-09 NOTE — Assessment & Plan Note (Signed)
Well controlled. Continue current medication. Trig better than before.

## 2015-12-09 NOTE — Progress Notes (Signed)
Pre visit review using our clinic review tool, if applicable. No additional management support is needed unless otherwise documented below in the visit note. 

## 2015-12-09 NOTE — Patient Instructions (Addendum)
Work aggressively on low carbohydrate low sugar diet.  If A1C not at goal < 7 in 3 months.. We will start a medication to treat diabetes. Stop at lab for microalbumin test.

## 2015-12-09 NOTE — Progress Notes (Signed)
The patient is here for annual wellness exam and preventative care.  I have personally reviewed the Medicare Annual Wellness questionnaire and have noted 1.The patient's medical and social history 2.Their use of alcohol, tobacco or illicit drugs 3.Their current medications and supplements 4.The patient's functional ability including ADL's, fall risks, home safety risks and hearing or visual  impairment. 5.Diet and physical activities 6.Evidence for depression or mood disorders 7. Updated provider list Cognitive evaluation was performed and recorded on pt medicare questionnaire form. The patients weight, height, BMI and visual acuity have been recorded in the chart  I have made referrals, counseling and provided education to the patient based review of the above and I have provided the pt with a written personalized care plan for preventive services.    Hypertension: Well controlled on current meds, HCTZ.  BP Readings from Last 3 Encounters:  12/09/15 120/74  09/10/15 110/66  06/11/15 120/74  Using medication without problems or lightheadedness: None  Chest pain with exertion: None  Edema:None  Short of breath:None  Average home BPs: not checking.  Other issues:   Elevated Cholesterol: At goal LDL on crestor and mega red krill. Trig elevated on fenofibrate  Lab Results  Component Value Date   CHOL 140 12/07/2015   HDL 32.30* 12/07/2015   LDLCALC 54 11/28/2013   LDLDIRECT 85.0 12/07/2015   TRIG 244.0* 12/07/2015   CHOLHDL 4 12/07/2015  Using medications without problems: None Muscle aches: None Diet compliance:Improved eating habits, Eating beef stew, vienna sausage.  Exercise: None, playing some golf. Other complaints:   Wt Readings from Last 3 Encounters:  12/09/15 230 lb 4 oz (104.441 kg)  09/10/15 232 lb (105.235 kg)  06/11/15 231 lb 12 oz (105.121 kg)   Body mass index is 33.53  kg/(m^2).   Diabetes: Improved control on no med but not at goal. Lab Results  Component Value Date   HGBA1C 7.1* 12/07/2015  Hypoglycemic episodes:?  Hyperglycemic episodes:?  Feet problems:Good  Blood Sugars averaging:not checking  eye exam within last year: yes   Renal insufficiency:  improved .  Social History /Family History/Past Medical History reviewed and updated if needed.  Review of Systems  Constitutional: Negative for fever, fatigue and unexpected weight change.  HENT: Negative for ear pain, congestion, sore throat, rhinorrhea, trouble swallowing and postnasal drip.  Eyes: Negative for pain.  Respiratory: Negative for cough, shortness of breath and wheezing.  Cardiovascular: Negative for chest pain, palpitations and leg swelling.  Gastrointestinal: Negative for nausea, abdominal pain, diarrhea, constipation and blood in stool.  Genitourinary: Negative for dysuria, urgency, hematuria, discharge, penile swelling, scrotal swelling, difficulty urinating, penile pain and testicular pain.  Skin: Negative for rash.  Neurological: Negative for syncope, weakness, light-headedness, numbness and headaches.  Psychiatric/Behavioral: Negative for behavioral problems and dysphoric mood. The patient is not nervous/anxious.  Objective:   Physical Exam  Constitutional: He appears well-developed and well-nourished. Non-toxic appearance. He does not appear ill. No distress.  HENT:  Head: Normocephalic and atraumatic.  Right Ear: Hearing, tympanic membrane, external ear and ear canal normal.  Left Ear: Hearing, tympanic membrane, external ear and ear canal normal.  Nose: Nose normal.  Mouth/Throat: Uvula is midline, oropharynx is clear and moist and mucous membranes are normal.  Eyes: Conjunctivae, EOM and lids are normal. Pupils are equal, round, and reactive to light. No foreign bodies found.  Neck: Trachea normal, normal range of motion and phonation normal.  Neck supple. Carotid bruit is not present. No mass and no thyromegaly present.  Cardiovascular: Normal rate, regular rhythm, S1 normal, S2 normal, intact distal pulses and normal pulses. Exam reveals no gallop.  No murmur heard.  Pulmonary/Chest: Breath sounds normal. He has no wheezes. He has no rhonchi. He has no rales.  Abdominal: Soft. Normal appearance and bowel sounds are normal. There is no hepatosplenomegaly. There is no tenderness. There is no rebound, no guarding and no CVA tenderness. No hernia.  Lymphadenopathy:  He has no cervical adenopathy.  Neurological: He is alert. He has normal strength and normal reflexes. No cranial nerve deficit or sensory deficit. Gait normal.  Skin: Skin is warm, dry and intact. No rash noted.  Psychiatric: He has a normal mood and affect. His speech is normal and behavior is normal. Judgment normal.   Diabetic foot exam:  Normal inspection  No skin breakdown  No calluses  Normal DP pulses  Normal sensation to light touch and monofilament  Nails normal        Assessment & Plan:   The patient's preventative maintenance and recommended screening tests for an annual wellness exam were reviewed in full today.  Brought up to date unless services declined.  Counselled on the importance of diet, exercise, and its role in overall health and mortality.  The patient's FH and SH was reviewed, including their home life, tobacco status, and drug and alcohol status.   Vaccines: PNA and Td uptodate, refuses shingles vaccine...says he has never had chicken pox  Former smoker.Quit 34 years ago.  Colon: 03/2013 Dr. Fuller Plan, plan repeat 5 years. Prostate: not indicated after age 23.  Due for urine microalbumin

## 2015-12-10 ENCOUNTER — Encounter: Payer: Self-pay | Admitting: *Deleted

## 2016-02-12 ENCOUNTER — Other Ambulatory Visit: Payer: Self-pay | Admitting: Family Medicine

## 2016-03-03 ENCOUNTER — Other Ambulatory Visit: Payer: Self-pay | Admitting: Family Medicine

## 2016-03-07 ENCOUNTER — Telehealth: Payer: Self-pay | Admitting: Family Medicine

## 2016-03-07 ENCOUNTER — Other Ambulatory Visit (INDEPENDENT_AMBULATORY_CARE_PROVIDER_SITE_OTHER): Payer: Commercial Managed Care - HMO

## 2016-03-07 DIAGNOSIS — E1339 Other specified diabetes mellitus with other diabetic ophthalmic complication: Secondary | ICD-10-CM

## 2016-03-07 DIAGNOSIS — E7800 Pure hypercholesterolemia, unspecified: Secondary | ICD-10-CM

## 2016-03-07 LAB — COMPREHENSIVE METABOLIC PANEL
ALT: 14 U/L (ref 0–53)
AST: 14 U/L (ref 0–37)
Albumin: 3.6 g/dL (ref 3.5–5.2)
Alkaline Phosphatase: 70 U/L (ref 39–117)
BUN: 17 mg/dL (ref 6–23)
CO2: 30 meq/L (ref 19–32)
Calcium: 8.6 mg/dL (ref 8.4–10.5)
Chloride: 103 mEq/L (ref 96–112)
Creatinine, Ser: 1.14 mg/dL (ref 0.40–1.50)
GFR: 66.63 mL/min (ref >60.00)
GLUCOSE: 139 mg/dL — AB (ref 70–99)
POTASSIUM: 4.4 meq/L (ref 3.5–5.1)
SODIUM: 138 meq/L (ref 135–145)
TOTAL PROTEIN: 6.8 g/dL (ref 6.0–8.3)
Total Bilirubin: 0.5 mg/dL (ref 0.2–1.2)

## 2016-03-07 LAB — HEMOGLOBIN A1C: Hgb A1c MFr Bld: 7.1 % — ABNORMAL HIGH (ref 4.6–6.5)

## 2016-03-07 LAB — LDL CHOLESTEROL, DIRECT: Direct LDL: 77 mg/dL

## 2016-03-07 LAB — LIPID PANEL
CHOL/HDL RATIO: 4
Cholesterol: 126 mg/dL (ref 0–200)
HDL: 32.5 mg/dL — AB (ref >39.00)
NONHDL: 93.44
TRIGLYCERIDES: 249 mg/dL — AB (ref 0.0–149.0)
VLDL: 49.8 mg/dL — AB (ref 0.0–40.0)

## 2016-03-07 NOTE — Telephone Encounter (Signed)
-----   Message from Ellamae Sia sent at 03/02/2016 12:59 PM EDT ----- Regarding: Lab orders for Tuesday, 9.26.17 Lab orders for f/u

## 2016-03-10 ENCOUNTER — Ambulatory Visit (INDEPENDENT_AMBULATORY_CARE_PROVIDER_SITE_OTHER): Payer: Commercial Managed Care - HMO | Admitting: Family Medicine

## 2016-03-10 ENCOUNTER — Encounter: Payer: Self-pay | Admitting: Family Medicine

## 2016-03-10 VITALS — BP 130/62 | HR 56 | Temp 98.4°F | Ht 69.5 in | Wt 231.5 lb

## 2016-03-10 DIAGNOSIS — E1339 Other specified diabetes mellitus with other diabetic ophthalmic complication: Secondary | ICD-10-CM

## 2016-03-10 DIAGNOSIS — E7800 Pure hypercholesterolemia, unspecified: Secondary | ICD-10-CM | POA: Diagnosis not present

## 2016-03-10 DIAGNOSIS — I10 Essential (primary) hypertension: Secondary | ICD-10-CM

## 2016-03-10 LAB — HM DIABETES FOOT EXAM

## 2016-03-10 NOTE — Patient Instructions (Signed)
Increase walking as able.  Check fasting blood sugar and 2 hours after meals to help determine where blood sugars are. Goal fasting blood sugar < 120, goal  2 hours after meals < 180.

## 2016-03-10 NOTE — Progress Notes (Signed)
Pre visit review using our clinic review tool, if applicable. No additional management support is needed unless otherwise documented below in the visit note. 

## 2016-03-10 NOTE — Assessment & Plan Note (Signed)
Well controlled. Continue current medication.  

## 2016-03-10 NOTE — Assessment & Plan Note (Signed)
LDL now back at goal on crestor.

## 2016-03-10 NOTE — Assessment & Plan Note (Signed)
Not at goal but stable. Will start checking blood sugars to help learn where he can make changes.  Increase exercise.  He will consider nutritionist.  Work on low carb diet.

## 2016-03-10 NOTE — Progress Notes (Signed)
75 year old male for 3 months follow up DM, cholesterol.   Hypertension: Well controlled on current meds, HCTZ.      BP Readings from Last 3 Encounters:  03/10/16 130/62  12/09/15 120/74  09/10/15 110/66   Using medication without problems or lightheadedness: None  Chest pain with exertion: None  Edema:None  Short of breath:None  Average home BPs: not checking.  Other issues:   Diabetes Stable control on no med But A1C not at goal..   He reports he is eating late, desserts.. Eating a lot of jelly beans.  Lab Results  Component Value Date   HGBA1C 7.1 (H) 03/07/2016  Hypoglycemic episodes:?  Hyperglycemic episodes:?  Feet problems:Good  Blood Sugars averaging:not checking eye exam within last year: yes  Renal insufficiency: stable  He has stopped late night snacking and eating less sugar.  Elevated Cholesterol: LDL at goal on crestor 20 mg daily.  AlsoTrigs remain high despite fenofibrate and lifestyle changes. Working on [employer]. Lab Results  Component Value Date   CHOL 126 03/07/2016   HDL 32.50 (L) 03/07/2016   LDLCALC 54 11/28/2013   LDLDIRECT 77.0 03/07/2016   TRIG 249.0 (H) 03/07/2016   CHOLHDL 4 03/07/2016   Using medications without problems:None Muscle aches: None Diet compliance: Improving Exercise: None Wt Readings from Last 3 Encounters:  03/10/16 231 lb 8 oz (105 kg)  12/09/15 230 lb 4 oz (104.4 kg)  09/10/15 232 lb (105.2 kg)   Other complaints:    Social History /Family History/Past Medical History reviewed and updated if needed.  Review of Systems  Constitutional: Negative for fever, fatigue and unexpected weight change.  HENT: Negative for ear pain, congestion, sore throat, rhinorrhea, trouble swallowing and postnasal drip.  Eyes: Negative for pain.  Respiratory: Negative for cough, shortness of breath and wheezing.  Cardiovascular: Negative for chest pain, palpitations and leg swelling.   Gastrointestinal: Negative for nausea, abdominal pain, diarrhea, constipation and blood in stool.  Genitourinary: Negative for dysuria, urgency, hematuria, discharge, penile swelling, scrotal swelling, difficulty urinating, penile pain and testicular pain.  Skin: Negative for rash.  Neurological: Negative for syncope, weakness, light-headedness, numbness and headaches.  Psychiatric/Behavioral: Negative for behavioral problems and dysphoric mood. The patient is not nervous/anxious.  Objective:   Physical Exam  Constitutional: He appears well-developed and well-nourished. Non-toxic appearance. He does not appear ill. No distress.  HENT:  Head: Normocephalic and atraumatic.  Right Ear: Hearing, tympanic membrane, external ear and ear canal normal.  Left Ear: Hearing, tympanic membrane, external ear and ear canal normal.  Nose: Nose normal.  Mouth/Throat: Uvula is midline, oropharynx is clear and moist and mucous membranes are normal.  Eyes: Conjunctivae, EOM and lids are normal. Pupils are equal, round, and reactive to light. No foreign bodies found.  Neck: Trachea normal, normal range of motion and phonation normal. Neck supple. Carotid bruit is not present. No mass and no thyromegaly present.  Cardiovascular: Normal rate, regular rhythm, S1 normal, S2 normal, intact distal pulses and normal pulses. Exam reveals no gallop.  No murmur heard.  Pulmonary/Chest: Breath sounds normal. He has no wheezes. He has no rhonchi. He has no rales.  He has no cervical adenopathy.  Neurological: He is alert. He has normal strength and normal reflexes. No cranial nerve deficit or sensory deficit. Gait normal.  Skin: Skin is warm, dry and intact. No rash noted.  Psychiatric: He has a normal mood and affect. His speech is normal and behavior is normal. Judgment normal.  Diabetic foot exam:  Normal inspection  No skin breakdown  No calluses  Normal DP pulses  Normal sensation to  light touch and monofilament  Nails normal

## 2016-05-30 ENCOUNTER — Telehealth: Payer: Self-pay | Admitting: Family Medicine

## 2016-05-30 DIAGNOSIS — E11319 Type 2 diabetes mellitus with unspecified diabetic retinopathy without macular edema: Secondary | ICD-10-CM

## 2016-05-30 NOTE — Telephone Encounter (Signed)
-----   Message from Marchia Bond sent at 05/24/2016  3:48 PM EST ----- Regarding: DM f/u labs Fri 12/22, need orders. Thanks! :-) Please order  future dm f/u labs for pt's upcoming lab appt. Thanks Aniceto Boss

## 2016-06-02 ENCOUNTER — Other Ambulatory Visit (INDEPENDENT_AMBULATORY_CARE_PROVIDER_SITE_OTHER): Payer: Commercial Managed Care - HMO

## 2016-06-02 DIAGNOSIS — E11319 Type 2 diabetes mellitus with unspecified diabetic retinopathy without macular edema: Secondary | ICD-10-CM | POA: Diagnosis not present

## 2016-06-02 LAB — LIPID PANEL
CHOL/HDL RATIO: 4
CHOLESTEROL: 137 mg/dL (ref 0–200)
HDL: 31.2 mg/dL — ABNORMAL LOW (ref >39.00)
NONHDL: 105.33
TRIGLYCERIDES: 268 mg/dL — AB (ref 0.0–149.0)
VLDL: 53.6 mg/dL — ABNORMAL HIGH (ref 0.0–40.0)

## 2016-06-02 LAB — COMPREHENSIVE METABOLIC PANEL
ALK PHOS: 61 U/L (ref 39–117)
ALT: 15 U/L (ref 0–53)
AST: 16 U/L (ref 0–37)
Albumin: 4 g/dL (ref 3.5–5.2)
BILIRUBIN TOTAL: 0.6 mg/dL (ref 0.2–1.2)
BUN: 18 mg/dL (ref 6–23)
CALCIUM: 9.1 mg/dL (ref 8.4–10.5)
CO2: 30 mEq/L (ref 19–32)
CREATININE: 1.21 mg/dL (ref 0.40–1.50)
Chloride: 102 mEq/L (ref 96–112)
GFR: 62.16 mL/min (ref >60.00)
GLUCOSE: 161 mg/dL — AB (ref 70–99)
Potassium: 4.3 mEq/L (ref 3.5–5.1)
Sodium: 138 mEq/L (ref 135–145)
TOTAL PROTEIN: 6.9 g/dL (ref 6.0–8.3)

## 2016-06-02 LAB — HEMOGLOBIN A1C: Hgb A1c MFr Bld: 7 % — ABNORMAL HIGH (ref 4.6–6.5)

## 2016-06-02 LAB — LDL CHOLESTEROL, DIRECT: Direct LDL: 75 mg/dL

## 2016-06-09 ENCOUNTER — Ambulatory Visit (INDEPENDENT_AMBULATORY_CARE_PROVIDER_SITE_OTHER): Payer: Commercial Managed Care - HMO | Admitting: Family Medicine

## 2016-06-09 ENCOUNTER — Encounter: Payer: Self-pay | Admitting: Family Medicine

## 2016-06-09 VITALS — BP 126/68 | HR 59 | Temp 98.3°F | Wt 224.0 lb

## 2016-06-09 DIAGNOSIS — I10 Essential (primary) hypertension: Secondary | ICD-10-CM

## 2016-06-09 DIAGNOSIS — E7800 Pure hypercholesterolemia, unspecified: Secondary | ICD-10-CM

## 2016-06-09 DIAGNOSIS — E11319 Type 2 diabetes mellitus with unspecified diabetic retinopathy without macular edema: Secondary | ICD-10-CM

## 2016-06-09 DIAGNOSIS — E291 Testicular hypofunction: Secondary | ICD-10-CM

## 2016-06-09 LAB — HM DIABETES FOOT EXAM

## 2016-06-09 MED ORDER — METFORMIN HCL 500 MG PO TABS: 500.0000 mg | ORAL_TABLET | Freq: Two times a day (BID) | ORAL | 11 refills | Status: DC

## 2016-06-09 NOTE — Assessment & Plan Note (Signed)
Well controlled. Continue current medication.  

## 2016-06-09 NOTE — Progress Notes (Signed)
Pre visit review using our clinic review tool, if applicable. No additional management support is needed unless otherwise documented below in the visit note. 

## 2016-06-09 NOTE — Assessment & Plan Note (Signed)
High risk CAD. ON high dose statin.

## 2016-06-09 NOTE — Patient Instructions (Addendum)
Goal fasting in morning blood sugar < 120, 2 hours after meals it should be < 180. Keep working on low carb diet and add regular exercise.  Start metformin daily.

## 2016-06-09 NOTE — Assessment & Plan Note (Signed)
Improving control but not < 7.. Start metformin low dose. Encouraged exercise, weight loss, healthy eating habits.

## 2016-06-09 NOTE — Progress Notes (Signed)
   Subjective:    Patient ID: Miguel Bradley, male    DOB: 1940/06/30, 75 y.o.   MRN: [id number]  HPI   75 year old male presents for follow up 3 month of DM and cholesterol   Diabetes:   Improved control almost at goal A1C < 7. Lab Results  Component Value Date   HGBA1C 7.0 (H) 06/02/2016  Using medications without difficulties: Hypoglycemic episodes: None Hyperglycemic episodes: None Feet problems: None Blood Sugars averaging: 136-171 eye exam within last year: 08/2015  Elevated Cholesterol:  On crestor 20 mg given high risk for CAD. LDL  Great! Lab Results  Component Value Date   CHOL 137 06/02/2016   HDL 31.20 (L) 06/02/2016   LDLCALC 54 11/28/2013   LDLDIRECT 75.0 06/02/2016   TRIG 268.0 (H) 06/02/2016   CHOLHDL 4 06/02/2016  Diet compliance: Moderate, trying to improve Exercise: none Other complaints:  He has lost 7 lbs in last 3 months! Wt Readings from Last 3 Encounters:  06/09/16 224 lb (101.6 kg)  03/10/16 231 lb 8 oz (105 kg)  12/09/15 230 lb 4 oz (104.4 kg)     Good control On HCTZ. BP Readings from Last 3 Encounters:  06/09/16 126/68  03/10/16 130/62  12/09/15 120/74    Review of Systems  Constitutional: Negative for fatigue.  HENT: Negative for ear pain.   Eyes: Negative for pain.  Respiratory: Negative for shortness of breath.   Cardiovascular: Negative for chest pain.  Gastrointestinal: Negative for abdominal distention.       Objective:   Physical Exam  Constitutional: Vital signs are normal. He appears well-developed and well-nourished.  central obesity  HENT:  Head: Normocephalic.  Right Ear: Hearing normal.  Left Ear: Hearing normal.  Nose: Nose normal.  Mouth/Throat: Oropharynx is clear and moist and mucous membranes are normal.  Neck: Trachea normal. Carotid bruit is not present. No thyroid mass and no thyromegaly present.  Cardiovascular: Normal rate, regular rhythm and normal pulses.  Exam reveals no gallop, no distant heart  sounds and no friction rub.   No murmur heard. No peripheral edema  Pulmonary/Chest: Effort normal and breath sounds normal. No respiratory distress.  Skin: Skin is warm, dry and intact. No rash noted.  Psychiatric: He has a normal mood and affect. His speech is normal and behavior is normal. Thought content normal.   Diabetic foot exam: Normal inspection No skin breakdown No calluses  Normal DP pulses Normal sensation to light touch and monofilament Nails normal       Assessment & Plan:

## 2016-07-10 ENCOUNTER — Other Ambulatory Visit: Payer: Self-pay | Admitting: Family Medicine

## 2016-08-01 DIAGNOSIS — H40153 Residual stage of open-angle glaucoma, bilateral: Secondary | ICD-10-CM | POA: Diagnosis not present

## 2016-08-17 ENCOUNTER — Ambulatory Visit (INDEPENDENT_AMBULATORY_CARE_PROVIDER_SITE_OTHER): Payer: Commercial Managed Care - HMO | Admitting: Ophthalmology

## 2016-08-21 ENCOUNTER — Ambulatory Visit (INDEPENDENT_AMBULATORY_CARE_PROVIDER_SITE_OTHER): Payer: Self-pay | Admitting: Ophthalmology

## 2016-08-31 ENCOUNTER — Telehealth: Payer: Self-pay | Admitting: Family Medicine

## 2016-08-31 DIAGNOSIS — E7800 Pure hypercholesterolemia, unspecified: Secondary | ICD-10-CM

## 2016-08-31 DIAGNOSIS — E11319 Type 2 diabetes mellitus with unspecified diabetic retinopathy without macular edema: Secondary | ICD-10-CM

## 2016-08-31 NOTE — Telephone Encounter (Signed)
-----   Message from Marchia Bond sent at 08/31/2016  2:06 PM EDT ----- Regarding: F/u labs Tues 3/27, need orders. Thanks! :-) Please order future f/ulabs for pt's upcoming lab appt. Thanks Aniceto Boss

## 2016-09-04 ENCOUNTER — Ambulatory Visit (INDEPENDENT_AMBULATORY_CARE_PROVIDER_SITE_OTHER): Payer: PPO | Admitting: Ophthalmology

## 2016-09-04 DIAGNOSIS — E11319 Type 2 diabetes mellitus with unspecified diabetic retinopathy without macular edema: Secondary | ICD-10-CM | POA: Diagnosis not present

## 2016-09-04 DIAGNOSIS — H35033 Hypertensive retinopathy, bilateral: Secondary | ICD-10-CM | POA: Diagnosis not present

## 2016-09-04 DIAGNOSIS — H35373 Puckering of macula, bilateral: Secondary | ICD-10-CM

## 2016-09-04 DIAGNOSIS — H43813 Vitreous degeneration, bilateral: Secondary | ICD-10-CM | POA: Diagnosis not present

## 2016-09-04 DIAGNOSIS — H2511 Age-related nuclear cataract, right eye: Secondary | ICD-10-CM | POA: Diagnosis not present

## 2016-09-04 DIAGNOSIS — E113293 Type 2 diabetes mellitus with mild nonproliferative diabetic retinopathy without macular edema, bilateral: Secondary | ICD-10-CM | POA: Diagnosis not present

## 2016-09-04 DIAGNOSIS — I10 Essential (primary) hypertension: Secondary | ICD-10-CM

## 2016-09-04 LAB — HM DIABETES EYE EXAM

## 2016-09-05 ENCOUNTER — Other Ambulatory Visit (INDEPENDENT_AMBULATORY_CARE_PROVIDER_SITE_OTHER): Payer: PPO

## 2016-09-05 DIAGNOSIS — E11319 Type 2 diabetes mellitus with unspecified diabetic retinopathy without macular edema: Secondary | ICD-10-CM

## 2016-09-05 DIAGNOSIS — E7800 Pure hypercholesterolemia, unspecified: Secondary | ICD-10-CM

## 2016-09-05 LAB — COMPREHENSIVE METABOLIC PANEL
ALK PHOS: 68 U/L (ref 39–117)
ALT: 15 U/L (ref 0–53)
AST: 17 U/L (ref 0–37)
Albumin: 3.9 g/dL (ref 3.5–5.2)
BUN: 15 mg/dL (ref 6–23)
CHLORIDE: 101 meq/L (ref 96–112)
CO2: 28 mEq/L (ref 19–32)
Calcium: 9.3 mg/dL (ref 8.4–10.5)
Creatinine, Ser: 1.19 mg/dL (ref 0.40–1.50)
GFR: 63.32 mL/min (ref >60.00)
GLUCOSE: 151 mg/dL — AB (ref 70–99)
POTASSIUM: 4.6 meq/L (ref 3.5–5.1)
Sodium: 135 mEq/L (ref 135–145)
TOTAL PROTEIN: 6.8 g/dL (ref 6.0–8.3)
Total Bilirubin: 0.6 mg/dL (ref 0.2–1.2)

## 2016-09-05 LAB — LIPID PANEL
CHOL/HDL RATIO: 4
Cholesterol: 141 mg/dL (ref 0–200)
HDL: 32.4 mg/dL — AB (ref >39.00)
NONHDL: 108.99
TRIGLYCERIDES: 230 mg/dL — AB (ref 0.0–149.0)
VLDL: 46 mg/dL — AB (ref 0.0–40.0)

## 2016-09-05 LAB — HEMOGLOBIN A1C: Hgb A1c MFr Bld: 7 % — ABNORMAL HIGH (ref 4.6–6.5)

## 2016-09-05 LAB — LDL CHOLESTEROL, DIRECT: Direct LDL: 85 mg/dL

## 2016-09-12 ENCOUNTER — Encounter: Payer: Self-pay | Admitting: Family Medicine

## 2016-09-12 ENCOUNTER — Ambulatory Visit (INDEPENDENT_AMBULATORY_CARE_PROVIDER_SITE_OTHER): Payer: PPO | Admitting: Family Medicine

## 2016-09-12 VITALS — BP 120/70 | HR 67 | Temp 97.5°F | Ht 69.5 in | Wt 224.5 lb

## 2016-09-12 DIAGNOSIS — E113393 Type 2 diabetes mellitus with moderate nonproliferative diabetic retinopathy without macular edema, bilateral: Secondary | ICD-10-CM

## 2016-09-12 DIAGNOSIS — E11319 Type 2 diabetes mellitus with unspecified diabetic retinopathy without macular edema: Secondary | ICD-10-CM | POA: Diagnosis not present

## 2016-09-12 DIAGNOSIS — E7800 Pure hypercholesterolemia, unspecified: Secondary | ICD-10-CM

## 2016-09-12 DIAGNOSIS — I10 Essential (primary) hypertension: Secondary | ICD-10-CM

## 2016-09-12 NOTE — Patient Instructions (Addendum)
Work on low carb  Low cholesterol diet. Make sure to take  metformin to 2 tabs a day.  Call if fasting sugars remain > 120 in morning after increasing metformin. Work on regular exercise and increase in activity.

## 2016-09-12 NOTE — Progress Notes (Signed)
Pre visit review using our clinic review tool, if applicable. No additional management support is needed unless otherwise documented below in the visit note. 

## 2016-09-12 NOTE — Progress Notes (Signed)
   Subjective:    Patient ID: Miguel Bradley, male    DOB: Feb 09, 1941, 76 y.o.   MRN: [id number]  HPI   76 year old male presents for  Follow up of DM.   Diabetes:   moderate control on metformin ... Has has been taking only 1 a day. Lab Results  Component Value Date   HGBA1C 7.0 (H) 09/05/2016  Using medications without difficulties: Hypoglycemic episodes:none Hyperglycemic episodes:none Feet problems:none Blood Sugars averaging: fbs 112-149 eye exam within last year: retinopathy 08/2016  Elevated Cholesterol:  Good control of LDL, trig better but remain high, low HDL.  on fenofibrate and crestor 20 mg daily. Lab Results  Component Value Date   CHOL 141 09/05/2016   HDL 32.40 (L) 09/05/2016   LDLCALC 54 11/28/2013   LDLDIRECT 85.0 09/05/2016   TRIG 230.0 (H) 09/05/2016   CHOLHDL 4 09/05/2016  Using medications without problems: none Muscle aches: none Diet compliance: moderate Exercise:none Other complaints:  Wt Readings from Last 3 Encounters:  09/12/16 224 lb 8 oz (101.8 kg)  06/09/16 224 lb (101.6 kg)  03/10/16 231 lb 8 oz (105 kg)    Hypertension:    Good control on HCTZ BP Readings from Last 3 Encounters:  09/12/16 120/70  06/09/16 126/68  03/10/16 130/62  Using medication without problems or lightheadedness:  one Chest pain with exertion: none Edema: none Short of breath:none Average home BPs: not checking. Other issues:    Review of Systems  Constitutional: Negative for fatigue.  HENT: Negative for ear pain.   Eyes: Negative for pain.  Respiratory: Negative for shortness of breath.   Cardiovascular: Negative for chest pain.  Gastrointestinal: Negative for abdominal distention.  Skin:       Has subcutaneous cyst on central chest, no redness, no discharge, no pain       Objective:   Physical Exam  Constitutional: Vital signs are normal. He appears well-developed and well-nourished.  obesity  HENT:  Head: Normocephalic.  Right Ear: Hearing  normal.  Left Ear: Hearing normal.  Nose: Nose normal.  Mouth/Throat: Oropharynx is clear and moist and mucous membranes are normal.  Neck: Trachea normal. Carotid bruit is not present. No thyroid mass and no thyromegaly present.  Cardiovascular: Normal rate, regular rhythm and normal pulses.  Exam reveals no gallop, no distant heart sounds and no friction rub.   No murmur heard. No peripheral edema  Pulmonary/Chest: Effort normal and breath sounds normal. No respiratory distress.  Skin: Skin is warm, dry and intact. No rash noted.  Quarter size subcutaneous cyst central chest  Psychiatric: He has a normal mood and affect. His speech is normal and behavior is normal. Thought content normal.          Assessment & Plan:

## 2016-09-12 NOTE — Assessment & Plan Note (Signed)
Followed by retinal specialist.. Will get records.

## 2016-09-12 NOTE — Assessment & Plan Note (Signed)
Well controlled. Continue current medication. Encouraged exercise, weight loss, healthy eating habits.  

## 2016-09-12 NOTE — Assessment & Plan Note (Signed)
Stable control. 

## 2016-09-12 NOTE — Assessment & Plan Note (Signed)
Pt instructed to take metformin as directed.. Recheck A1C in 3 months Encouraged exercise, weight loss, healthy eating habits.

## 2016-09-18 ENCOUNTER — Encounter: Payer: Self-pay | Admitting: Family Medicine

## 2016-11-28 DIAGNOSIS — H40153 Residual stage of open-angle glaucoma, bilateral: Secondary | ICD-10-CM | POA: Diagnosis not present

## 2016-12-09 DIAGNOSIS — M79641 Pain in right hand: Secondary | ICD-10-CM | POA: Diagnosis not present

## 2016-12-09 DIAGNOSIS — Z23 Encounter for immunization: Secondary | ICD-10-CM | POA: Diagnosis not present

## 2016-12-09 DIAGNOSIS — S61431A Puncture wound without foreign body of right hand, initial encounter: Secondary | ICD-10-CM | POA: Diagnosis not present

## 2016-12-12 ENCOUNTER — Ambulatory Visit (INDEPENDENT_AMBULATORY_CARE_PROVIDER_SITE_OTHER): Payer: PPO | Admitting: Family Medicine

## 2016-12-12 ENCOUNTER — Other Ambulatory Visit (INDEPENDENT_AMBULATORY_CARE_PROVIDER_SITE_OTHER): Payer: PPO

## 2016-12-12 ENCOUNTER — Telehealth: Payer: Self-pay | Admitting: Family Medicine

## 2016-12-12 ENCOUNTER — Encounter: Payer: Self-pay | Admitting: Family Medicine

## 2016-12-12 VITALS — BP 122/78 | HR 58 | Temp 97.9°F | Wt 221.8 lb

## 2016-12-12 DIAGNOSIS — S61209A Unspecified open wound of unspecified finger without damage to nail, initial encounter: Secondary | ICD-10-CM

## 2016-12-12 DIAGNOSIS — E11319 Type 2 diabetes mellitus with unspecified diabetic retinopathy without macular edema: Secondary | ICD-10-CM

## 2016-12-12 DIAGNOSIS — S61209D Unspecified open wound of unspecified finger without damage to nail, subsequent encounter: Secondary | ICD-10-CM

## 2016-12-12 LAB — LIPID PANEL
CHOL/HDL RATIO: 4
Cholesterol: 117 mg/dL (ref 0–200)
HDL: 30.9 mg/dL — ABNORMAL LOW (ref >39.00)
NONHDL: 86.02
TRIGLYCERIDES: 231 mg/dL — AB (ref 0.0–149.0)
VLDL: 46.2 mg/dL — ABNORMAL HIGH (ref 0.0–40.0)

## 2016-12-12 LAB — MICROALBUMIN / CREATININE URINE RATIO
Creatinine,U: 148.6 mg/dL
Microalb Creat Ratio: 0.7 mg/g (ref 0.0–30.0)
Microalb, Ur: 1.1 mg/dL (ref 0.0–1.9)

## 2016-12-12 LAB — COMPREHENSIVE METABOLIC PANEL
ALK PHOS: 63 U/L (ref 39–117)
ALT: 13 U/L (ref 0–53)
AST: 15 U/L (ref 0–37)
Albumin: 4 g/dL (ref 3.5–5.2)
BILIRUBIN TOTAL: 0.6 mg/dL (ref 0.2–1.2)
BUN: 18 mg/dL (ref 6–23)
CO2: 28 meq/L (ref 19–32)
CREATININE: 1.21 mg/dL (ref 0.40–1.50)
Calcium: 9.6 mg/dL (ref 8.4–10.5)
Chloride: 103 mEq/L (ref 96–112)
GFR: 62.07 mL/min (ref >60.00)
GLUCOSE: 110 mg/dL — AB (ref 70–99)
Potassium: 3.7 mEq/L (ref 3.5–5.1)
SODIUM: 138 meq/L (ref 135–145)
TOTAL PROTEIN: 7.2 g/dL (ref 6.0–8.3)

## 2016-12-12 LAB — HEMOGLOBIN A1C: Hgb A1c MFr Bld: 6.8 % — ABNORMAL HIGH (ref 4.6–6.5)

## 2016-12-12 LAB — LDL CHOLESTEROL, DIRECT: Direct LDL: 64 mg/dL

## 2016-12-12 MED ORDER — DOXYCYCLINE HYCLATE 100 MG PO TABS: 100.0000 mg | ORAL_TABLET | Freq: Two times a day (BID) | ORAL | 0 refills | Status: DC

## 2016-12-12 NOTE — Patient Instructions (Signed)
Add on doxycycline 1 week course, finish keflex antibiotic.  Continue dressing changes daily.

## 2016-12-12 NOTE — Telephone Encounter (Signed)
-----   Message from Ellamae Sia sent at 12/06/2016  2:51 PM EDT ----- Regarding: Lab orders for Tuesday, 7.3.18 Lab orders for a 3 month follow up appt.

## 2016-12-12 NOTE — Progress Notes (Signed)
BP 122/78   Pulse (!) 58   Temp 97.9 F (36.6 C) (Oral)   Wt 221 lb 12 oz (100.6 kg)   SpO2 97%   BMI 32.28 kg/m    CC: finger injury Subjective:    Patient ID: Miguel Bradley, male    DOB: May 05, 1941, 77 y.o.   MRN: [id number]  HPI: Miguel Bradley is a 76 y.o. male presenting on 12/12/2016 for Finger Injury (cut ring finger on right hand x 4 days ago, replacing deck tripped and fell on wood with nails sticking out...)   [place] 4 d ago, tripped over deck and landed on exposed nails on piece of wood. Injured R lower leg (abrasions) and R hand (puncture dorsal 4th finger, palmar laceration wound). Saw [facility] at [facility]. He did have xrays done at [facility] (not available). Started on triple abx cream, started on keflex 500mg  bid. He had Td updated.  Hand stays tender, swelling is improving each day. No draining, no streaking redness, no fever, no nausea.   Has upcoming CPE with PCP on Friday.   Relevant past medical, surgical, family and social history reviewed and updated as indicated. Interim medical history since our last visit reviewed. Allergies and medications reviewed and updated. Outpatient Medications Prior to Visit  Medication Sig Dispense Refill  . aspirin EC 81 MG tablet Take 81 mg by mouth daily.    . Cholecalciferol (VITAMIN D) 2000 UNITS tablet Take 2,000 Units by mouth daily.      . dorzolamide-timolol (COSOPT) 22.3-6.8 MG/ML ophthalmic solution     . fenofibrate 160 MG tablet TAKE ONE TABLET BY MOUTH EVERY DAY 30 tablet 9  . hydrochlorothiazide (HYDRODIURIL) 25 MG tablet TAKE ONE TABLET BY MOUTH EVERY DAY 90 tablet 1  . latanoprost (XALATAN) 0.005 % ophthalmic solution     . MEGARED OMEGA-3 KRILL OIL PO Take 1 capsule by mouth daily.    . metFORMIN (GLUCOPHAGE) 500 MG tablet Take 1 tablet (500 mg total) by mouth 2 (two) times daily with a meal. 60 tablet 11  . rosuvastatin (CRESTOR) 20 MG tablet TAKE 1 TABLET BY MOUTH EVERY DAY 90 tablet 2   No facility-administered  medications prior to visit.      Per HPI unless specifically indicated in ROS section below Review of Systems     Objective:    BP 122/78   Pulse (!) 58   Temp 97.9 F (36.6 C) (Oral)   Wt 221 lb 12 oz (100.6 kg)   SpO2 97%   BMI 32.28 kg/m   Wt Readings from Last 3 Encounters:  12/12/16 221 lb 12 oz (100.6 kg)  09/12/16 224 lb 8 oz (101.8 kg)  06/09/16 224 lb (101.6 kg)    Physical Exam  Constitutional: He appears well-developed and well-nourished. No distress.  Skin: Skin is warm and dry. No rash noted. There is erythema.  Abrasions anterior right lower leg Healing laceration right palmar 4th digit without surrounding erythema Puncture wound right dorsal 4th digit between MCP and PIP with surrounding erythema, no fluctuance or drainage  Nursing note and vitals reviewed.  Results for orders placed or performed in visit on 09/21/16  HM DIABETES EYE EXAM  Result Value Ref Range   HM Diabetic Eye Exam  No Retinopathy   Lab Results  Component Value Date   HGBA1C 7.0 (H) 09/05/2016    Lab Results  Component Value Date   CREATININE 1.19 09/05/2016       Assessment & Plan:  Problem List Items Addressed This Visit    Finger wound, simple, open, subsequent encounter - Primary    Tetanus UTD. New erythema around dorsal 4th digit wound - will continue keflex and add on doxycycline 7d course. Recheck Friday with PCP. Update if worsening in interim.  Finger wound dressed with triple abx and tube gauze finger dressing.           Follow up plan: Return if symptoms worsen or fail to improve.  Ria Bush, MD

## 2016-12-12 NOTE — Assessment & Plan Note (Addendum)
Tetanus UTD. New erythema around dorsal 4th digit wound - will continue keflex and add on doxycycline 7d course. Recheck Friday with PCP. Update if worsening in interim.  Finger wound dressed with triple abx and tube gauze finger dressing.

## 2016-12-15 ENCOUNTER — Encounter: Payer: Self-pay | Admitting: Family Medicine

## 2016-12-15 ENCOUNTER — Ambulatory Visit (INDEPENDENT_AMBULATORY_CARE_PROVIDER_SITE_OTHER): Payer: PPO | Admitting: Family Medicine

## 2016-12-15 VITALS — BP 120/72 | HR 57 | Temp 98.0°F | Ht 69.5 in | Wt 221.5 lb

## 2016-12-15 DIAGNOSIS — E113393 Type 2 diabetes mellitus with moderate nonproliferative diabetic retinopathy without macular edema, bilateral: Secondary | ICD-10-CM | POA: Diagnosis not present

## 2016-12-15 DIAGNOSIS — E7800 Pure hypercholesterolemia, unspecified: Secondary | ICD-10-CM

## 2016-12-15 DIAGNOSIS — I10 Essential (primary) hypertension: Secondary | ICD-10-CM

## 2016-12-15 DIAGNOSIS — S61209D Unspecified open wound of unspecified finger without damage to nail, subsequent encounter: Secondary | ICD-10-CM | POA: Diagnosis not present

## 2016-12-15 DIAGNOSIS — E11319 Type 2 diabetes mellitus with unspecified diabetic retinopathy without macular edema: Secondary | ICD-10-CM | POA: Diagnosis not present

## 2016-12-15 LAB — HM DIABETES FOOT EXAM

## 2016-12-15 NOTE — Patient Instructions (Addendum)
Conitnue antibiotics.  Keep wound covered when working, apply topical antibiotics ointment.

## 2016-12-15 NOTE — Assessment & Plan Note (Signed)
Followed by opthamology.

## 2016-12-15 NOTE — Progress Notes (Signed)
   Subjective:    Patient ID: Miguel Bradley, male    DOB: Jan 24, 1941, 76 y.o.   MRN: [id number]  HPI  76 year old male presents for follow up DM 3 months.  He was also seen on 7/3 for finger wound by Dr. Darnell Level. Tripped over deck and landed on exposed nails on piece of wood on 6/29. Injured R lower leg (abrasions) and R hand (puncture dorsal 4th finger, palmar laceration wound). Saw [facility] at [facility]. He did have xrays done at [facility] (not available). Started on triple abx cream, started on keflex 500mg  bid. He had Td updated. 7/3 changed to doxycycline given pain and redness.   Today he reports:  Scratches on legs healing, less pain ( 2/10 on pain scale) in right 4th digit puncture. Less redness.  No fever.  Diabetes:   Good control on metformin Lab Results  Component Value Date   HGBA1C 6.8 (H) 12/12/2016  Using medications without difficulties: Hypoglycemic episodes: none Hyperglycemic episodes: none Feet problems:none Blood Sugars averaging: 127-130 eye exam within last year: 08/2016  Hypertension:   good control on HCTZ.  BP Readings from Last 3 Encounters:  12/15/16 120/72  12/12/16 122/78  09/12/16 120/70  Using medication without problems or lightheadedness:  none Chest pain with exertion: none Edema: none Short of breath: none Average home BPs: Other issues:  Elevated Cholesterol:  LDL at goal < 70 on crestor. Lab Results  Component Value Date   CHOL 117 12/12/2016   HDL 30.90 (L) 12/12/2016   LDLCALC 54 11/28/2013   LDLDIRECT 64.0 12/12/2016   TRIG 231.0 (H) 12/12/2016   CHOLHDL 4 12/12/2016  Using medications without problems: none Muscle aches: none Diet compliance: working on it Exercise:  Walking, yard Other complaints:   Review of Systems  Constitutional: Negative for fatigue and fever.  HENT: Negative for ear pain.   Eyes: Negative for pain.  Respiratory: Negative for cough and shortness of breath.   Cardiovascular: Negative for chest pain,  palpitations and leg swelling.  Gastrointestinal: Negative for abdominal pain.  Genitourinary: Negative for dysuria.  Musculoskeletal: Negative for arthralgias.  Neurological: Negative for syncope, light-headedness and headaches.  Psychiatric/Behavioral: Negative for dysphoric mood.       Objective:   Physical Exam  Constitutional: Vital signs are normal. He appears well-developed and well-nourished.  obesity  HENT:  Head: Normocephalic.  Right Ear: Hearing normal.  Left Ear: Hearing normal.  Nose: Nose normal.  Mouth/Throat: Oropharynx is clear and moist and mucous membranes are normal.  Neck: Trachea normal. Carotid bruit is not present. No thyroid mass and no thyromegaly present.  Cardiovascular: Normal rate, regular rhythm and normal pulses.  Exam reveals no gallop, no distant heart sounds and no friction rub.   No murmur heard. No peripheral edema  Pulmonary/Chest: Effort normal and breath sounds normal. No respiratory distress.  Skin: Skin is warm, dry and intact. No rash noted.  1 cm diameter redness around wound centrally on 4th digit, full ROM of fingers,   healing laceration on base of palmar aspect of 4th digit  scratches on right anterior calf, minimal surrounding erythema.  Psychiatric: He has a normal mood and affect. His speech is normal and behavior is normal. Thought content normal.          Assessment & Plan:

## 2016-12-15 NOTE — Assessment & Plan Note (Signed)
Good motion of finger, nml strength and sensation, decreasing redness with change to doxy.  Complete antibiotics and follow up in 4 days.

## 2016-12-15 NOTE — Assessment & Plan Note (Signed)
Well controlled. Continue current medication. Encouraged exercise, weight loss, healthy eating habits.  

## 2016-12-15 NOTE — Assessment & Plan Note (Signed)
Well controlled. Continue current medication.  

## 2016-12-19 ENCOUNTER — Ambulatory Visit (INDEPENDENT_AMBULATORY_CARE_PROVIDER_SITE_OTHER): Payer: PPO | Admitting: Family Medicine

## 2016-12-19 ENCOUNTER — Encounter: Payer: Self-pay | Admitting: Family Medicine

## 2016-12-19 DIAGNOSIS — S61209D Unspecified open wound of unspecified finger without damage to nail, subsequent encounter: Secondary | ICD-10-CM | POA: Diagnosis not present

## 2016-12-19 NOTE — Patient Instructions (Addendum)
Call if any pain and redness  reurns in finger. Keep area clean and dry.

## 2016-12-19 NOTE — Assessment & Plan Note (Signed)
Associated superinfection resolved, nml strength and ROM and no indication for referral to hand specilist.

## 2016-12-19 NOTE — Progress Notes (Signed)
   Subjective:    Patient ID: Miguel Bradley, male    DOB: 01-05-1941, 76 y.o.   MRN: [id number]  HPI    76 year old male presents for follow up on finger puncture wound.. Treated for super-infeciton with keflex and transitioned to doxy for lack of improvement.  Today pt has complete antibiotics. He reports no fever. He has less redness, minimal pain, able to grip now.  Review of Systems  Constitutional: Negative for fatigue and fever.  HENT: Negative for ear pain.   Eyes: Negative for redness.  Respiratory: Negative for shortness of breath.   Cardiovascular: Negative for chest pain.       Objective:   Physical Exam  Constitutional: Vital signs are normal. He appears well-developed and well-nourished.  HENT:  Head: Normocephalic.  Right Ear: Hearing normal.  Left Ear: Hearing normal.  Nose: Nose normal.  Mouth/Throat: Oropharynx is clear and moist and mucous membranes are normal.  Neck: Trachea normal. Carotid bruit is not present. No thyroid mass and no thyromegaly present.  Cardiovascular: Normal rate, regular rhythm and normal pulses.  Exam reveals no gallop, no distant heart sounds and no friction rub.   No murmur heard. No peripheral edema  Pulmonary/Chest: Effort normal and breath sounds normal. No respiratory distress.  Skin: Skin is warm, dry and intact. No rash noted.   Healing puncture on right 4th digit  Dorsal and palmar.. Minimal redness and pain  full ROM of finger and hand on right   Healing scratches on right anterior calf, minimal surrounding erythema.  Psychiatric: He has a normal mood and affect. His speech is normal and behavior is normal. Thought content normal.          Assessment & Plan:

## 2016-12-29 ENCOUNTER — Other Ambulatory Visit: Payer: Self-pay | Admitting: Family Medicine

## 2017-01-09 ENCOUNTER — Other Ambulatory Visit: Payer: Self-pay

## 2017-01-09 NOTE — Patient Outreach (Signed)
    Unsuccessful attempt made to contact patient via telephone for HTA Screening.  Plan: Send [facility] literature to patient describing program and contact information for contact if needed.

## 2017-01-17 ENCOUNTER — Other Ambulatory Visit: Payer: Self-pay | Admitting: Family Medicine

## 2017-02-24 ENCOUNTER — Other Ambulatory Visit: Payer: Self-pay | Admitting: Family Medicine

## 2017-03-22 DIAGNOSIS — H40153 Residual stage of open-angle glaucoma, bilateral: Secondary | ICD-10-CM | POA: Diagnosis not present

## 2017-05-02 DIAGNOSIS — X32XXXA Exposure to sunlight, initial encounter: Secondary | ICD-10-CM | POA: Diagnosis not present

## 2017-05-02 DIAGNOSIS — D225 Melanocytic nevi of trunk: Secondary | ICD-10-CM | POA: Diagnosis not present

## 2017-05-02 DIAGNOSIS — D2261 Melanocytic nevi of right upper limb, including shoulder: Secondary | ICD-10-CM | POA: Diagnosis not present

## 2017-05-02 DIAGNOSIS — L821 Other seborrheic keratosis: Secondary | ICD-10-CM | POA: Diagnosis not present

## 2017-05-02 DIAGNOSIS — Z85828 Personal history of other malignant neoplasm of skin: Secondary | ICD-10-CM | POA: Diagnosis not present

## 2017-05-02 DIAGNOSIS — L570 Actinic keratosis: Secondary | ICD-10-CM | POA: Diagnosis not present

## 2017-06-14 ENCOUNTER — Telehealth: Payer: Self-pay | Admitting: Family Medicine

## 2017-06-14 ENCOUNTER — Ambulatory Visit (INDEPENDENT_AMBULATORY_CARE_PROVIDER_SITE_OTHER): Payer: PPO

## 2017-06-14 VITALS — BP 114/60 | HR 51 | Temp 97.8°F | Ht 69.5 in | Wt 231.2 lb

## 2017-06-14 DIAGNOSIS — E7800 Pure hypercholesterolemia, unspecified: Secondary | ICD-10-CM

## 2017-06-14 DIAGNOSIS — Z0000 Encounter for general adult medical examination without abnormal findings: Secondary | ICD-10-CM

## 2017-06-14 DIAGNOSIS — E11319 Type 2 diabetes mellitus with unspecified diabetic retinopathy without macular edema: Secondary | ICD-10-CM

## 2017-06-14 LAB — COMPREHENSIVE METABOLIC PANEL
ALBUMIN: 3.9 g/dL (ref 3.5–5.2)
ALT: 13 U/L (ref 0–53)
AST: 17 U/L (ref 0–37)
Alkaline Phosphatase: 63 U/L (ref 39–117)
BUN: 14 mg/dL (ref 6–23)
CALCIUM: 8.9 mg/dL (ref 8.4–10.5)
CHLORIDE: 102 meq/L (ref 96–112)
CO2: 26 meq/L (ref 19–32)
CREATININE: 1.06 mg/dL (ref 0.40–1.50)
GFR: 72.21 mL/min (ref >60.00)
Glucose, Bld: 140 mg/dL — ABNORMAL HIGH (ref 70–99)
Potassium: 3.8 mEq/L (ref 3.5–5.1)
Sodium: 136 mEq/L (ref 135–145)
Total Bilirubin: 0.5 mg/dL (ref 0.2–1.2)
Total Protein: 6.8 g/dL (ref 6.0–8.3)

## 2017-06-14 LAB — LIPID PANEL
CHOL/HDL RATIO: 3
CHOLESTEROL: 112 mg/dL (ref 0–200)
HDL: 32.1 mg/dL — ABNORMAL LOW (ref >39.00)
NONHDL: 79.94
TRIGLYCERIDES: 214 mg/dL — AB (ref 0.0–149.0)
VLDL: 42.8 mg/dL — ABNORMAL HIGH (ref 0.0–40.0)

## 2017-06-14 LAB — LDL CHOLESTEROL, DIRECT: LDL DIRECT: 67 mg/dL

## 2017-06-14 LAB — HEMOGLOBIN A1C: Hgb A1c MFr Bld: 7 % — ABNORMAL HIGH (ref 4.6–6.5)

## 2017-06-14 NOTE — Progress Notes (Signed)
Subjective:   Miguel Bradley is a 77 y.o. male who presents for Medicare Annual/Subsequent preventive examination.  Review of Systems:  N/A Cardiac Risk Factors include: advanced age (>76men, >25 women);diabetes mellitus;male gender;obesity (BMI >30kg/m2);hypertension;dyslipidemia     Objective:    Vitals: BP 114/60 (BP Location: Right Arm, Patient Position: Sitting, Cuff Size: Large)   Pulse (!) 51   Temp 97.8 F (36.6 C) (Oral)   Ht 5' 9.5" (1.765 m) Comment: no shoes  Wt 231 lb 4 oz (104.9 kg)   SpO2 94%   BMI 33.66 kg/m   Body mass index is 33.66 kg/m.  Advanced Directives 06/14/2017 12/09/2015  Does Patient Have a Medical Advance Directive? Yes Yes  Type of Paramedic of [place];Living will [place];Living will  Does patient want to make changes to medical advance directive? - No - Patient declined  Copy of [place] in Chart? Yes No - copy requested    Tobacco Social History   Tobacco Use  Smoking Status Former Smoker  . Years: 25.00  . Last attempt to quit: 06/12/1978  . Years since quitting: 39.0  Smokeless Tobacco Never Used     Counseling given: No   Clinical Intake:  Pre-visit preparation completed: Yes  Pain : No/denies pain Pain Score: 0-No pain     Nutritional Status: BMI > 30  Obese Nutritional Risks: None Diabetes: Yes CBG done?: No Did pt. bring in CBG monitor from home?: No  How often do you need to have someone help you when you read instructions, pamphlets, or other written materials from your doctor or pharmacy?: 1 - Never What is the last grade level you completed in school?: 12th grade + some college courses  Interpreter Needed?: No  Comments: pt is a widower and lives alone Information entered by :: LPinson, LPN  Past Medical History:  Diagnosis Date  . Basal cell carcinoma of face   . Hypercalcemia   . Hyperparathyroidism, unspecified ([place])   . Impotence of  organic origin   . Other testicular hypofunction   . Pure hyperglyceridemia   . Special screening for malignant neoplasm of prostate   . Special screening for malignant neoplasms, colon   . Unspecified essential hypertension    Past Surgical History:  Procedure Laterality Date  . PARATHYROIDECTOMY  2012  . skin cancer removal     Dr. Sharlett Iles  . TRIGGER FINGER RELEASE Left 2008   Family History  Problem Relation Age of Onset  . Hypertension Mother   . Dementia Mother   . Diabetes Unknown        aunt  . Hypertension Brother   . Hypertension Brother   . Hypertension Brother   . Hyperlipidemia Brother   . Hyperlipidemia Brother   . Hyperlipidemia Brother   . Hypertension Sister   . Hyperlipidemia Sister   . Colon cancer Neg Hx   . Esophageal cancer Neg Hx   . Rectal cancer Neg Hx   . Stomach cancer Neg Hx    Social History   Socioeconomic History  . Marital status: Married    Spouse name: None  . Number of children: 1  . Years of education: None  . Highest education level: None  Social Needs  . Financial resource strain: None  . Food insecurity - worry: None  . Food insecurity - inability: None  . Transportation needs - medical: None  . Transportation needs - non-medical: None  Occupational History  .  Occupation: Water quality scientist at [employer]  . Smoking status: Former Smoker    Years: 25.00    Last attempt to quit: 06/12/1978    Years since quitting: 39.0  . Smokeless tobacco: Never Used  Substance and Sexual Activity  . Alcohol use: Yes    Comment: 1 beer every few weeks  . Drug use: No  . Sexual activity: Not Currently  Other Topics Concern  . None  Social History Narrative   No exercise, plays golf.   Poor diet.    Outpatient Encounter Medications as of 06/14/2017  Medication Sig  . aspirin EC 81 MG tablet Take 81 mg by mouth daily.  . Cholecalciferol (VITAMIN D) 2000 UNITS tablet Take 2,000 Units by mouth daily.    . dorzolamide-timolol  (COSOPT) 22.3-6.8 MG/ML ophthalmic solution   . fenofibrate 160 MG tablet TAKE ONE TABLET BY MOUTY EVERY DAY.  . hydrochlorothiazide (HYDRODIURIL) 25 MG tablet TAKE ONE TABLET BY MOUTH EVERY DAY  . latanoprost (XALATAN) 0.005 % ophthalmic solution   . MEGARED OMEGA-3 KRILL OIL PO Take 1 capsule by mouth daily.  . metFORMIN (GLUCOPHAGE) 500 MG tablet Take 1 tablet (500 mg total) by mouth 2 (two) times daily with a meal.  . rosuvastatin (CRESTOR) 20 MG tablet TAKE 1 TABLET BY MOUTH EVERY DAY  . [DISCONTINUED] rosuvastatin (CRESTOR) 20 MG tablet TAKE 1 TABLET BY MOUTH EVERY DAY   No facility-administered encounter medications on file as of 06/14/2017.     Activities of Daily Living In your present state of health, do you have any difficulty performing the following activities: 06/14/2017  Hearing? Y  Comment wears hearing aids  Vision? Y  Comment glaucoma; left eye retina concerns  Difficulty concentrating or making decisions? N  Walking or climbing stairs? N  Dressing or bathing? N  Doing errands, shopping? N  Preparing Food and eating ? N  Using the Toilet? N  In the past six months, have you accidently leaked urine? N  Do you have problems with loss of bowel control? N  Managing your Medications? N  Managing your Finances? N  Housekeeping or managing your Housekeeping? N  Some recent data might be hidden    Patient Care Team: Jinny Sanders, MD as PCP - General Hayden Pedro, MD as Consulting Physician (Ophthalmology) Lorelee Cover., MD as Referring Physician (Ophthalmology) Oneta Rack, MD as Referring Physician (Dermatology) Ladene Artist, MD as Consulting Physician (Gastroenterology) Rosalene Billings., MD as Referring Physician (Dentistry)   Assessment:   This is a routine wellness examination for Miguel Bradley.  Hearing Screening Comments: Bilateral hearing aids Vision Screening Comments: Last vision exam in March 2018  Exercise Activities and Dietary  recommendations Current Exercise Habits: The patient has a physically strenous job, but has no regular exercise apart from work., Exercise limited by: None identified  Goals    . DIET - INCREASE WATER INTAKE     Starting 06/14/2017, I will continue to drink at least 36 oz of water daily and to eat 4-5 servings of fresh fruits and vegetables.        Fall Risk Fall Risk  06/14/2017 12/15/2016 12/09/2015 12/08/2014 12/05/2013  Falls in the past year? No Yes No No No  Number falls in past yr: - 1 - - -  Injury with Fall? - Yes - - -   Depression Screen PHQ 2/9 Scores 06/14/2017 12/15/2016 12/09/2015 12/08/2014  PHQ - 2 Score 0 0 0 0  PHQ- 9  Score 0 - - -    Cognitive Function MMSE - Mini Mental State Exam 06/14/2017  Orientation to time 5  Orientation to Place 5  Registration 3  Attention/ Calculation 0  Recall 3  Language- name 2 objects 0  Language- repeat 1  Language- follow 3 step command 3  Language- read & follow direction 0  Write a sentence 0  Copy design 0  Total score 20     PLEASE NOTE: A Mini-Cog screen was completed. Maximum score is 20. A value of 0 denotes this part of [place] MMSE was not completed or the patient failed this part of the Mini-Cog screening.   Mini-Cog Screening Orientation to Time - Max 5 pts Orientation to Place - Max 5 pts Registration - Max 3 pts Recall - Max 3 pts Language Repeat - Max 1 pts Language Follow 3 Step Command - Max 3 pts     Immunization History  Administered Date(s) Administered  . Influenza, High Dose Seasonal PF 03/26/2017  . Influenza,inj,Quad PF,6+ Mos 04/22/2015, 02/28/2016  . Pneumococcal Conjugate-13 12/08/2014  . Pneumococcal Polysaccharide-23 11/16/2009  . Td 11/16/2009, 12/09/2016  . Tdap 06/10/2015    Screening Tests Health Maintenance  Topic Date Due  . OPHTHALMOLOGY EXAM  09/04/2017  . HEMOGLOBIN A1C  12/12/2017  . URINE MICROALBUMIN  12/12/2017  . FOOT EXAM  12/15/2017  . COLONOSCOPY  03/13/2018  .  TETANUS/TDAP  12/10/2026  . INFLUENZA VACCINE  Completed  . PNA vac Low Risk Adult  Completed      Plan:     I have personally reviewed, addressed, and noted the following in the patient's chart:  A. Medical and social history B. Use of alcohol, tobacco or illicit drugs  C. Current medications and supplements D. Functional ability and status E.  Nutritional status F.  Physical activity G. Advance directives H. List of other physicians I.  Hospitalizations, surgeries, and ER visits in previous 12 months J.  [place] to include hearing, vision, cognitive, depression L. Referrals and appointments - none  In addition, I have reviewed and discussed with patient certain preventive protocols, quality metrics, and best practice recommendations. A written personalized care plan for preventive services as well as general preventive health recommendations were provided to patient.  See attached scanned questionnaire for additional information.   Signed,   Lindell Noe, MHA, BS, LPN Health Coach

## 2017-06-14 NOTE — Progress Notes (Signed)
PCP notes:   Health maintenance:  A1C - completed  Abnormal screenings:   None  Patient concerns:   None  Nurse concerns:  None  Next PCP appt:   06/21/17 @ 1000

## 2017-06-14 NOTE — Patient Instructions (Addendum)
Mr. Nebel , Thank you for taking time to come for your Medicare Wellness Visit. I appreciate your ongoing commitment to your health goals. Please review the following plan we discussed and let me know if I can assist you in the future.   These are the goals we discussed: Goals    . DIET - INCREASE WATER INTAKE     Starting 06/14/2017, I will continue to drink at least 36 oz of water daily and to eat 4-5 servings of fresh fruits and vegetables.        This is a list of the screening recommended for you and due dates:  Health Maintenance  Topic Date Due  . Eye exam for diabetics  09/04/2017  . Hemoglobin A1C  12/12/2017  . Urine Protein Check  12/12/2017  . Complete foot exam   12/15/2017  . Colon Cancer Screening  03/13/2018  . Tetanus Vaccine  12/10/2026  . Flu Shot  Completed  . Pneumonia vaccines  Completed   Preventive Care for Adults  A healthy lifestyle and preventive care can promote health and wellness. Preventive health guidelines for adults include the following key practices.  . A routine yearly physical is a good way to check with your health care provider about your health and preventive screening. It is a chance to share any concerns and updates on your health and to receive a thorough exam.  . Visit your dentist for a routine exam and preventive care every 6 months. Brush your teeth twice a day and floss once a day. Good oral hygiene prevents tooth decay and gum disease.  . The frequency of eye exams is based on your age, health, family medical history, use  of contact lenses, and other factors. Follow your health care provider's recommendations for frequency of eye exams.  . Eat a healthy diet. Foods like vegetables, fruits, whole grains, low-fat dairy products, and lean protein foods contain the nutrients you need without too many calories. Decrease your intake of foods high in solid fats, added sugars, and salt. Eat the right amount of calories for you. Get  information about a proper diet from your health care provider, if necessary.  . Regular physical exercise is one of the most important things you can do for your health. Most adults should get at least 150 minutes of moderate-intensity exercise (any activity that increases your heart rate and causes you to sweat) each week. In addition, most adults need muscle-strengthening exercises on 2 or more days a week.  Silver Sneakers may be a benefit available to you. To determine eligibility, you may visit the website: www.silversneakers.com or contact program at 705-687-4477 Mon-Fri between 8AM-8PM.   . Maintain a healthy weight. The body mass index (BMI) is a screening tool to identify possible weight problems. It provides an estimate of body fat based on height and weight. Your health care provider can find your BMI and can help you achieve or maintain a healthy weight.   For adults 20 years and older: ? A BMI below 18.5 is considered underweight. ? A BMI of 18.5 to 24.9 is normal. ? A BMI of 25 to 29.9 is considered overweight. ? A BMI of 30 and above is considered obese.   . Maintain normal blood lipids and cholesterol levels by exercising and minimizing your intake of saturated fat. Eat a balanced diet with plenty of fruit and vegetables. Blood tests for lipids and cholesterol should begin at age 26 and be repeated every 5 years. If  your lipid or cholesterol levels are high, you are over 50, or you are at high risk for heart disease, you may need your cholesterol levels checked more frequently. Ongoing high lipid and cholesterol levels should be treated with medicines if diet and exercise are not working.  . If you smoke, find out from your health care provider how to quit. If you do not use tobacco, please do not start.  . If you choose to drink alcohol, please do not consume more than 2 drinks per day. One drink is considered to be 12 ounces (355 mL) of beer, 5 ounces (148 mL) of wine, or 1.5  ounces (44 mL) of liquor.  . If you are 7-75 years old, ask your health care provider if you should take aspirin to prevent strokes.  . Use sunscreen. Apply sunscreen liberally and repeatedly throughout the day. You should seek shade when your shadow is shorter than you. Protect yourself by wearing long sleeves, pants, a wide-brimmed hat, and sunglasses year round, whenever you are outdoors.  . Once a month, do a whole body skin exam, using a mirror to look at the skin on your back. Tell your health care provider of new moles, moles that have irregular borders, moles that are larger than a pencil eraser, or moles that have changed in shape or color.

## 2017-06-14 NOTE — Telephone Encounter (Signed)
-----   Message from Eustace Pen, LPN sent at 27/08/5007  4:29 PM EST ----- Regarding: Labs 1/3 Lab orders needed. Thank you.  Insurance:  [employer]

## 2017-06-14 NOTE — Progress Notes (Signed)
Pre visit review using our clinic review tool, if applicable. No additional management support is needed unless otherwise documented below in the visit note. 

## 2017-06-15 NOTE — Progress Notes (Signed)
I reviewed health advisor's note, was available for consultation, and agree with documentation and plan.   Signed,  Lanai Conlee T. Chriss Mannan, MD  

## 2017-06-21 ENCOUNTER — Encounter: Payer: Self-pay | Admitting: Family Medicine

## 2017-06-21 ENCOUNTER — Other Ambulatory Visit: Payer: Self-pay

## 2017-06-21 ENCOUNTER — Ambulatory Visit (INDEPENDENT_AMBULATORY_CARE_PROVIDER_SITE_OTHER): Payer: PPO | Admitting: Family Medicine

## 2017-06-21 VITALS — BP 132/74 | HR 56 | Temp 98.7°F | Ht 69.5 in | Wt 225.5 lb

## 2017-06-21 DIAGNOSIS — Z0000 Encounter for general adult medical examination without abnormal findings: Secondary | ICD-10-CM | POA: Diagnosis not present

## 2017-06-21 DIAGNOSIS — E113393 Type 2 diabetes mellitus with moderate nonproliferative diabetic retinopathy without macular edema, bilateral: Secondary | ICD-10-CM

## 2017-06-21 DIAGNOSIS — Z6828 Body mass index (BMI) 28.0-28.9, adult: Secondary | ICD-10-CM | POA: Insufficient documentation

## 2017-06-21 DIAGNOSIS — E1140 Type 2 diabetes mellitus with diabetic neuropathy, unspecified: Secondary | ICD-10-CM | POA: Insufficient documentation

## 2017-06-21 DIAGNOSIS — E11319 Type 2 diabetes mellitus with unspecified diabetic retinopathy without macular edema: Secondary | ICD-10-CM

## 2017-06-21 DIAGNOSIS — E7800 Pure hypercholesterolemia, unspecified: Secondary | ICD-10-CM | POA: Diagnosis not present

## 2017-06-21 DIAGNOSIS — E1142 Type 2 diabetes mellitus with diabetic polyneuropathy: Secondary | ICD-10-CM | POA: Diagnosis not present

## 2017-06-21 DIAGNOSIS — E6609 Other obesity due to excess calories: Secondary | ICD-10-CM | POA: Diagnosis not present

## 2017-06-21 DIAGNOSIS — Z6832 Body mass index (BMI) 32.0-32.9, adult: Secondary | ICD-10-CM | POA: Diagnosis not present

## 2017-06-21 DIAGNOSIS — I10 Essential (primary) hypertension: Secondary | ICD-10-CM | POA: Diagnosis not present

## 2017-06-21 DIAGNOSIS — E663 Overweight: Secondary | ICD-10-CM | POA: Insufficient documentation

## 2017-06-21 DIAGNOSIS — E669 Obesity, unspecified: Secondary | ICD-10-CM | POA: Insufficient documentation

## 2017-06-21 LAB — HM DIABETES FOOT EXAM

## 2017-06-21 NOTE — Assessment & Plan Note (Signed)
Well controlled. Continue current medication.  

## 2017-06-21 NOTE — Assessment & Plan Note (Signed)
Continue lifestyle changes.. If not at goal next OV.. Will consider increasing metformin.

## 2017-06-21 NOTE — Assessment & Plan Note (Signed)
Encouraged exercise, weight loss, healthy eating habits. ? ?

## 2017-06-21 NOTE — Progress Notes (Signed)
Subjective:    Patient ID: Miguel Bradley, male    DOB: 1940-08-27, 77 y.o.   MRN: [id number]  HPI   The patient presents for annual medicare wellness, complete physical and review of chronic health problems.   The patient saw Candis Musa, LPN for medicare wellness. Note reviewed in detail and important notes copied below. Health maintenance:  A1C - completed Abnormal screenings:  None Patient concerns:  None  06/21/17 Today:  Doing well, no acute issues.  Hypertension:    Good control on HCTZ BP Readings from Last 3 Encounters:  06/21/17 132/74  06/14/17 114/60  12/19/16 (!) 110/50  Using medication without problems or lightheadedness:  none Chest pain with exertion: none Edema: none Short of breath: none Average home BPs: good Other issues:   Elevated Cholesterol:  Good control on crestor Lab Results  Component Value Date   CHOL 112 06/14/2017   HDL 32.10 (L) 06/14/2017   LDLCALC 54 11/28/2013   LDLDIRECT 67.0 06/14/2017   TRIG 214.0 (H) 06/14/2017   CHOLHDL 3 06/14/2017  Using medications without problems: none Muscle aches:  none Diet compliance: none Exercise: walking some Other complaints:  Diabetes:   Inadequate control  despite metformin. Lab Results  Component Value Date   HGBA1C 7.0 (H) 06/14/2017  Using medications without difficulties: Hypoglycemic episodes: Hyperglycemic episodes: Feet problems: Has noted mild numbness in feet, intermittantly Blood Sugars averaging: eye exam within last year:  Body mass index is 32.82 kg/m. Wt Readings from Last 3 Encounters:  06/21/17 225 lb 8 oz (102.3 kg)  06/14/17 231 lb 4 oz (104.9 kg)  12/19/16 225 lb 4 oz (102.2 kg)     Social History /Family History/Past Medical History reviewed in detail and updated in EMR if needed. Blood pressure 132/74, pulse (!) 56, temperature 98.7 F (37.1 C), temperature source Oral, height 5' 9.5" (1.765 m), weight 225 lb 8 oz (102.3 kg).     Review of  Systems  Constitutional: Negative for fatigue and fever.  HENT: Negative for ear pain.   Eyes: Negative for pain.  Respiratory: Negative for cough and shortness of breath.   Cardiovascular: Negative for chest pain, palpitations and leg swelling.  Gastrointestinal: Negative for abdominal pain.  Genitourinary: Negative for dysuria.  Musculoskeletal: Negative for arthralgias.  Neurological: Negative for syncope, light-headedness and headaches.  Psychiatric/Behavioral: Negative for dysphoric mood.       Objective:   Physical Exam  Constitutional: Vital signs are normal. He appears well-developed and well-nourished.  obesity  HENT:  Head: Normocephalic.  Right Ear: Hearing normal.  Left Ear: Hearing normal.  Nose: Nose normal.  Mouth/Throat: Oropharynx is clear and moist and mucous membranes are normal.  Neck: Trachea normal. Carotid bruit is not present. No thyroid mass and no thyromegaly present.  Cardiovascular: Normal rate, regular rhythm and normal pulses. Exam reveals no gallop, no distant heart sounds and no friction rub.  No murmur heard. No peripheral edema  Pulmonary/Chest: Effort normal and breath sounds normal. No respiratory distress.  Skin: Skin is warm, dry and intact. No rash noted.  Psychiatric: He has a normal mood and affect. His speech is normal and behavior is normal. Thought content normal.    Diabetic foot exam: Normal inspection No skin breakdown No calluses  Normal DP pulses Normal sensation to light touch and monofilament Nails normal       Assessment & Plan:  The patient's preventative maintenance and recommended screening tests for an annual wellness exam were reviewed in full  today. Brought up to date unless services declined.  Counselled on the importance of diet, exercise, and its role in overall health and mortality. The patient's FH and SH was reviewed, including their home life, tobacco status, and drug and alcohol status.   Vaccines:  PNA and Td uptodate, refuses shingles vaccine...says he has never had chicken pox  Former smoker.Quit 34 years ago.  Colon: 03/2013 Dr. Fuller Plan, plan repeat 5 years. Prostate: not indicated after age 68.

## 2017-06-21 NOTE — Patient Instructions (Addendum)
Continue working on weight loss, increase walking and work on low carb diet.  Plan colonoscopy this year.

## 2017-06-21 NOTE — Assessment & Plan Note (Signed)
Mild.. No treatment med needed. Get better control of DM.

## 2017-07-26 DIAGNOSIS — H40153 Residual stage of open-angle glaucoma, bilateral: Secondary | ICD-10-CM | POA: Diagnosis not present

## 2017-07-30 ENCOUNTER — Other Ambulatory Visit: Payer: Self-pay | Admitting: Family Medicine

## 2017-09-10 ENCOUNTER — Ambulatory Visit (INDEPENDENT_AMBULATORY_CARE_PROVIDER_SITE_OTHER): Payer: PPO | Admitting: Ophthalmology

## 2017-09-10 DIAGNOSIS — H43813 Vitreous degeneration, bilateral: Secondary | ICD-10-CM | POA: Diagnosis not present

## 2017-09-10 DIAGNOSIS — H35033 Hypertensive retinopathy, bilateral: Secondary | ICD-10-CM | POA: Diagnosis not present

## 2017-09-10 DIAGNOSIS — H35372 Puckering of macula, left eye: Secondary | ICD-10-CM | POA: Diagnosis not present

## 2017-09-10 DIAGNOSIS — H26492 Other secondary cataract, left eye: Secondary | ICD-10-CM | POA: Diagnosis not present

## 2017-09-10 DIAGNOSIS — I10 Essential (primary) hypertension: Secondary | ICD-10-CM

## 2017-09-10 LAB — HM DIABETES EYE EXAM

## 2017-09-13 ENCOUNTER — Encounter: Payer: Self-pay | Admitting: Family Medicine

## 2017-09-26 ENCOUNTER — Encounter (INDEPENDENT_AMBULATORY_CARE_PROVIDER_SITE_OTHER): Payer: PPO | Admitting: Ophthalmology

## 2017-09-26 DIAGNOSIS — H2702 Aphakia, left eye: Secondary | ICD-10-CM

## 2017-12-05 DIAGNOSIS — H40153 Residual stage of open-angle glaucoma, bilateral: Secondary | ICD-10-CM | POA: Diagnosis not present

## 2017-12-12 ENCOUNTER — Telehealth: Payer: Self-pay | Admitting: Family Medicine

## 2017-12-12 ENCOUNTER — Other Ambulatory Visit (INDEPENDENT_AMBULATORY_CARE_PROVIDER_SITE_OTHER): Payer: PPO

## 2017-12-12 DIAGNOSIS — E11319 Type 2 diabetes mellitus with unspecified diabetic retinopathy without macular edema: Secondary | ICD-10-CM | POA: Diagnosis not present

## 2017-12-12 LAB — COMPREHENSIVE METABOLIC PANEL
ALK PHOS: 65 U/L (ref 39–117)
ALT: 14 U/L (ref 0–53)
AST: 15 U/L (ref 0–37)
Albumin: 3.9 g/dL (ref 3.5–5.2)
BILIRUBIN TOTAL: 0.6 mg/dL (ref 0.2–1.2)
BUN: 22 mg/dL (ref 6–23)
CALCIUM: 9.1 mg/dL (ref 8.4–10.5)
CO2: 24 meq/L (ref 19–32)
CREATININE: 1.06 mg/dL (ref 0.40–1.50)
Chloride: 101 mEq/L (ref 96–112)
GFR: 72.12 mL/min (ref >60.00)
Glucose, Bld: 138 mg/dL — ABNORMAL HIGH (ref 70–99)
Potassium: 4.1 mEq/L (ref 3.5–5.1)
SODIUM: 135 meq/L (ref 135–145)
TOTAL PROTEIN: 6.4 g/dL (ref 6.0–8.3)

## 2017-12-12 LAB — LIPID PANEL
Cholesterol: 127 mg/dL (ref 0–200)
HDL: 33.8 mg/dL — ABNORMAL LOW (ref >39.00)
NONHDL: 93.41
TRIGLYCERIDES: 212 mg/dL — AB (ref 0.0–149.0)
Total CHOL/HDL Ratio: 4
VLDL: 42.4 mg/dL — ABNORMAL HIGH (ref 0.0–40.0)

## 2017-12-12 LAB — LDL CHOLESTEROL, DIRECT: Direct LDL: 72 mg/dL

## 2017-12-12 LAB — HEMOGLOBIN A1C: HEMOGLOBIN A1C: 6.7 % — AB (ref 4.6–6.5)

## 2017-12-12 NOTE — Telephone Encounter (Signed)
-----   Message from Lendon Collar, RT sent at 12/03/2017 11:42 AM EDT ----- Regarding: Lab orders for Wed 12/12/17 Please enter lab orders for upcoming 6 month follow-up. Thanks-Lauren

## 2017-12-20 ENCOUNTER — Encounter: Payer: Self-pay | Admitting: Family Medicine

## 2017-12-20 ENCOUNTER — Ambulatory Visit (INDEPENDENT_AMBULATORY_CARE_PROVIDER_SITE_OTHER): Payer: PPO | Admitting: Family Medicine

## 2017-12-20 VITALS — BP 144/78 | HR 56 | Temp 98.3°F | Ht 69.5 in | Wt 225.5 lb

## 2017-12-20 DIAGNOSIS — E7800 Pure hypercholesterolemia, unspecified: Secondary | ICD-10-CM

## 2017-12-20 DIAGNOSIS — I10 Essential (primary) hypertension: Secondary | ICD-10-CM

## 2017-12-20 DIAGNOSIS — E113393 Type 2 diabetes mellitus with moderate nonproliferative diabetic retinopathy without macular edema, bilateral: Secondary | ICD-10-CM | POA: Diagnosis not present

## 2017-12-20 DIAGNOSIS — E1142 Type 2 diabetes mellitus with diabetic polyneuropathy: Secondary | ICD-10-CM | POA: Diagnosis not present

## 2017-12-20 DIAGNOSIS — E11319 Type 2 diabetes mellitus with unspecified diabetic retinopathy without macular edema: Secondary | ICD-10-CM

## 2017-12-20 LAB — HM DIABETES FOOT EXAM

## 2017-12-20 LAB — MICROALBUMIN / CREATININE URINE RATIO
CREATININE, U: 54.2 mg/dL
MICROALB/CREAT RATIO: 1.3 mg/g (ref 0.0–30.0)
Microalb, Ur: <0.7 mg/dL (ref 0.0–1.9)

## 2017-12-20 NOTE — Assessment & Plan Note (Signed)
Followed at opthamologist

## 2017-12-20 NOTE — Assessment & Plan Note (Signed)
Minimal symptoms

## 2017-12-20 NOTE — Assessment & Plan Note (Signed)
Improved control on metformin. Encouraged exercise, weight loss, healthy eating habits.

## 2017-12-20 NOTE — Assessment & Plan Note (Signed)
Good control at home.. Follow over time.

## 2017-12-20 NOTE — Patient Instructions (Addendum)
Get back on track with walking/exercise.  Work on weight loss as well.

## 2017-12-20 NOTE — Assessment & Plan Note (Signed)
LDL at control on  Crestor. Trig remain high but stable on fenofibrate.

## 2017-12-20 NOTE — Progress Notes (Signed)
   Subjective:    Patient ID: Miguel Bradley, male    DOB: Jun 18, 1940, 77 y.o.   MRN: [id number]  HPI   77 year old male presents for 6 month follow up.  Diabetes:  Good control on metformin Lab Results  Component Value Date   HGBA1C 6.7 (H) 12/12/2017  Using medications without difficulties: Hypoglycemic episodes:none Hyperglycemic episodes:none Feet problems: no ulcers Blood Sugars averaging: FBS 120-130 eye exam within last year: 09/2017  retinopathy and neuropathy secondary to DM  Hypertension:  Elevated in office today. On HCTZ... Occ missing BP Readings from Last 3 Encounters:  12/20/17 (!) 144/78  06/21/17 132/74  06/14/17 114/60  Using medication without problems or lightheadedness: none Chest pain with exertion:none Edema: none Short of breath:none Average home BPs: Other issues:  Elevated Cholesterol:  LDL at goal < 100 on crestor, fenofibrate.. Trig stable Lab Results  Component Value Date   CHOL 127 12/12/2017   HDL 33.80 (L) 12/12/2017   LDLCALC 54 11/28/2013   LDLDIRECT 72.0 12/12/2017   TRIG 212.0 (H) 12/12/2017   CHOLHDL 4 12/12/2017  Using medications without problems: Muscle aches:  Diet compliance: moderate Exercise: none Other complaints:  Social History /Family History/Past Medical History reviewed in detail and updated in EMR if needed. Blood pressure (!) 144/78, pulse (!) 56, temperature 98.3 F (36.8 C), temperature source Oral, height 5' 9.5" (1.765 m), weight 225 lb 8 oz (102.3 kg), SpO2 96 %.   Review of Systems  Constitutional: Negative for fatigue and fever.  HENT: Negative for ear pain.   Eyes: Negative for pain.  Respiratory: Negative for cough and shortness of breath.   Cardiovascular: Negative for chest pain, palpitations and leg swelling.  Gastrointestinal: Negative for abdominal pain.  Genitourinary: Negative for dysuria.  Musculoskeletal: Negative for arthralgias.  Neurological: Negative for syncope, light-headedness and  headaches.  Psychiatric/Behavioral: Negative for dysphoric mood.       Objective:   Physical Exam  Constitutional: Vital signs are normal. He appears well-developed and well-nourished.  obesity  HENT:  Head: Normocephalic.  Right Ear: Hearing normal.  Left Ear: Hearing normal.  Nose: Nose normal.  Mouth/Throat: Oropharynx is clear and moist and mucous membranes are normal.  Neck: Trachea normal. Carotid bruit is not present. No thyroid mass and no thyromegaly present.  Cardiovascular: Normal rate, regular rhythm and normal pulses. Exam reveals no gallop, no distant heart sounds and no friction rub.  No murmur heard. No peripheral edema  Pulmonary/Chest: Effort normal and breath sounds normal. No respiratory distress.  Skin: Skin is warm, dry and intact. No rash noted.  Psychiatric: He has a normal mood and affect. His speech is normal and behavior is normal. Thought content normal.          Assessment & Plan:

## 2017-12-21 ENCOUNTER — Encounter: Payer: Self-pay | Admitting: *Deleted

## 2018-01-22 ENCOUNTER — Other Ambulatory Visit: Payer: Self-pay | Admitting: Family Medicine

## 2018-03-07 ENCOUNTER — Other Ambulatory Visit: Payer: Self-pay | Admitting: Family Medicine

## 2018-04-03 DIAGNOSIS — H40153 Residual stage of open-angle glaucoma, bilateral: Secondary | ICD-10-CM | POA: Diagnosis not present

## 2018-04-08 ENCOUNTER — Encounter: Payer: Self-pay | Admitting: Gastroenterology

## 2018-04-24 ENCOUNTER — Encounter: Payer: Self-pay | Admitting: Gastroenterology

## 2018-05-02 DIAGNOSIS — Z08 Encounter for follow-up examination after completed treatment for malignant neoplasm: Secondary | ICD-10-CM | POA: Diagnosis not present

## 2018-05-02 DIAGNOSIS — D2261 Melanocytic nevi of right upper limb, including shoulder: Secondary | ICD-10-CM | POA: Diagnosis not present

## 2018-05-02 DIAGNOSIS — D225 Melanocytic nevi of trunk: Secondary | ICD-10-CM | POA: Diagnosis not present

## 2018-05-02 DIAGNOSIS — D2262 Melanocytic nevi of left upper limb, including shoulder: Secondary | ICD-10-CM | POA: Diagnosis not present

## 2018-05-02 DIAGNOSIS — L821 Other seborrheic keratosis: Secondary | ICD-10-CM | POA: Diagnosis not present

## 2018-05-02 DIAGNOSIS — D2271 Melanocytic nevi of right lower limb, including hip: Secondary | ICD-10-CM | POA: Diagnosis not present

## 2018-05-02 DIAGNOSIS — D2272 Melanocytic nevi of left lower limb, including hip: Secondary | ICD-10-CM | POA: Diagnosis not present

## 2018-05-02 DIAGNOSIS — Z85828 Personal history of other malignant neoplasm of skin: Secondary | ICD-10-CM | POA: Diagnosis not present

## 2018-05-02 DIAGNOSIS — L728 Other follicular cysts of the skin and subcutaneous tissue: Secondary | ICD-10-CM | POA: Diagnosis not present

## 2018-05-31 ENCOUNTER — Ambulatory Visit (AMBULATORY_SURGERY_CENTER): Payer: Self-pay

## 2018-05-31 ENCOUNTER — Encounter: Payer: Self-pay | Admitting: Gastroenterology

## 2018-05-31 VITALS — Ht 69.5 in | Wt 225.0 lb

## 2018-05-31 DIAGNOSIS — Z86010 Personal history of colonic polyps: Secondary | ICD-10-CM

## 2018-05-31 MED ORDER — PEG 3350-KCL-NA BICARB-NACL 420 G PO SOLR: 4000.0000 mL | Freq: Once | ORAL | 0 refills | Status: AC

## 2018-05-31 NOTE — Progress Notes (Signed)
Denies allergies to eggs or soy products. Denies complication of anesthesia or sedation. Denies use of weight loss medication. Denies use of O2.   Emmi instructions declined.  

## 2018-06-13 ENCOUNTER — Encounter: Payer: Self-pay | Admitting: Gastroenterology

## 2018-06-13 ENCOUNTER — Ambulatory Visit (AMBULATORY_SURGERY_CENTER): Payer: PPO | Admitting: Gastroenterology

## 2018-06-13 VITALS — BP 101/51 | HR 57 | Temp 97.5°F | Resp 16 | Ht 69.0 in | Wt 225.0 lb

## 2018-06-13 DIAGNOSIS — Z86010 Personal history of colonic polyps: Secondary | ICD-10-CM | POA: Diagnosis not present

## 2018-06-13 DIAGNOSIS — Z1211 Encounter for screening for malignant neoplasm of colon: Secondary | ICD-10-CM | POA: Diagnosis not present

## 2018-06-13 DIAGNOSIS — I10 Essential (primary) hypertension: Secondary | ICD-10-CM | POA: Diagnosis not present

## 2018-06-13 DIAGNOSIS — D122 Benign neoplasm of ascending colon: Secondary | ICD-10-CM | POA: Diagnosis not present

## 2018-06-13 DIAGNOSIS — E119 Type 2 diabetes mellitus without complications: Secondary | ICD-10-CM | POA: Diagnosis not present

## 2018-06-13 DIAGNOSIS — D649 Anemia, unspecified: Secondary | ICD-10-CM | POA: Diagnosis not present

## 2018-06-13 HISTORY — PX: COLONOSCOPY: SHX174

## 2018-06-13 MED ORDER — SODIUM CHLORIDE 0.9 % IV SOLN: 500.0000 mL | Freq: Once | INTRAVENOUS | Status: DC

## 2018-06-13 NOTE — Progress Notes (Signed)
Pt's states no medical or surgical changes since previsit or office visit. 

## 2018-06-13 NOTE — Op Note (Signed)
[facility] Patient Name: Miguel Bradley Procedure Date: 06/13/2018 2:04 PM MRN: [id number] Endoscopist: Ladene Artist , MD Age: 78 Referring MD:  Date of Birth: Dec 13, 1940 Gender: Male Account #: [id number] Procedure:                Colonoscopy Indications:              Surveillance: Personal history of adenomatous                            polyps on last colonoscopy 5 years ago Medicines:                Monitored Anesthesia Care Procedure:                Pre-Anesthesia Assessment:                           - Prior to the procedure, a History and Physical                            was performed, and patient medications and                            allergies were reviewed. The patient's tolerance of                            previous anesthesia was also reviewed. The risks                            and benefits of the procedure and the sedation                            options and risks were discussed with the patient.                            All questions were answered, and informed consent                            was obtained. Prior Anticoagulants: The patient has                            taken no previous anticoagulant or antiplatelet                            agents. ASA Grade Assessment: II - A patient with                            mild systemic disease. After reviewing the risks                            and benefits, the patient was deemed in                            satisfactory condition to undergo the procedure.  After obtaining informed consent, the colonoscope                            was passed under direct vision. Throughout the                            procedure, the patient's blood pressure, pulse, and                            oxygen saturations were monitored continuously. The                            Colonoscope was introduced through the anus and                            advanced to the the cecum,  identified by                            appendiceal orifice and ileocecal valve. The                            ileocecal valve, appendiceal orifice, and rectum                            were photographed. The quality of the bowel                            preparation was adequate. The colonoscopy was                            performed without difficulty. The patient tolerated                            the procedure well. Scope In: 2:10:12 PM Scope Out: 2:25:19 PM Scope Withdrawal Time: 0 hours 12 minutes 48 seconds  Total Procedure Duration: 0 hours 15 minutes 7 seconds  Findings:                 The perianal and digital rectal examinations were                            normal.                           A 7 mm polyp was found in the ascending colon. The                            polyp was sessile. The polyp was removed with a                            cold snare. Resection and retrieval were complete.                           Multiple large-mouthed diverticula were found in  the left colon. There was evidence of diverticular                            spasm. There was no evidence of diverticular                            bleeding.                           The exam was otherwise without abnormality on                            direct and retroflexion views. Complications:            No immediate complications. Estimated blood loss:                            None. Estimated Blood Loss:     Estimated blood loss: none. Impression:               - One 7 mm polyp in the ascending colon, removed                            with a cold snare. Resected and retrieved.                           - Moderate diverticulosis in the left colon.                           - The examination was otherwise normal on direct                            and retroflexion views. Recommendation:           - Patient has a contact number available for                             emergencies. The signs and symptoms of potential                            delayed complications were discussed with the                            patient. Return to normal activities tomorrow.                            Written discharge instructions were provided to the                            patient.                           - High fiber diet.                           - Continue present medications.                           -  Await pathology results.                           - No repeat colonoscopy due to age. Ladene Artist, MD 06/13/2018 2:28:08 PM This report has been signed electronically.

## 2018-06-13 NOTE — Progress Notes (Signed)
Called to room to assist during endoscopic procedure.  Patient ID and intended procedure confirmed with present staff. Received instructions for my participation in the procedure from the performing physician.  

## 2018-06-13 NOTE — Patient Instructions (Signed)
Impression/Recommendations:  Polyp handout given to patient. Diverticulosis handout given to patient.  High fiber diet handout given to patient. Continue present medications.  Await pathology results. No repeat colonoscopy indicated.  YOU HAD AN ENDOSCOPIC PROCEDURE TODAY AT [facility]:   Refer to the procedure report that was given to you for any specific questions about what was found during the examination.  If the procedure report does not answer your questions, please call your gastroenterologist to clarify.  If you requested that your care partner not be given the details of your procedure findings, then the procedure report has been included in a sealed envelope for you to review at your convenience later.  YOU SHOULD EXPECT: Some feelings of bloating in the abdomen. Passage of more gas than usual.  Walking can help get rid of the air that was put into your GI tract during the procedure and reduce the bloating. If you had a lower endoscopy (such as a colonoscopy or flexible sigmoidoscopy) you may notice spotting of blood in your stool or on the toilet paper. If you underwent a bowel prep for your procedure, you may not have a normal bowel movement for a few days.  Please Note:  You might notice some irritation and congestion in your nose or some drainage.  This is from the oxygen used during your procedure.  There is no need for concern and it should clear up in a day or so.  SYMPTOMS TO REPORT IMMEDIATELY:   Following lower endoscopy (colonoscopy or flexible sigmoidoscopy):  Excessive amounts of blood in the stool  Significant tenderness or worsening of abdominal pains  Swelling of the abdomen that is new, acute  Fever of 100F or higher For urgent or emergent issues, a gastroenterologist can be reached at any hour by calling [phone].   DIET:  We do recommend a small meal at first, but then you may proceed to your regular diet.  Drink plenty of fluids but  you should avoid alcoholic beverages for 24 hours.  ACTIVITY:  You should plan to take it easy for the rest of today and you should NOT DRIVE or use heavy machinery until tomorrow (because of the sedation medicines used during the test).    FOLLOW UP: Our staff will call the number listed on your records the next business day following your procedure to check on you and address any questions or concerns that you may have regarding the information given to you following your procedure. If we do not reach you, we will leave a message.  However, if you are feeling well and you are not experiencing any problems, there is no need to return our call.  We will assume that you have returned to your regular daily activities without incident.  If any biopsies were taken you will be contacted by phone or by letter within the next 1-3 weeks.  Please call us at [phone] if you have not heard about the biopsies in 3 weeks.    SIGNATURES/CONFIDENTIALITY: You and/or your care partner have signed paperwork which will be entered into your electronic medical record.  These signatures attest to the fact that that the information above on your After Visit Summary has been reviewed and is understood.  Full responsibility of the confidentiality of this discharge information lies with you and/or your care-partner.

## 2018-06-13 NOTE — Progress Notes (Signed)
PT taken to PACU. Monitors in place. VSS. Report given to RN. 

## 2018-06-14 ENCOUNTER — Telehealth: Payer: Self-pay

## 2018-06-14 NOTE — Telephone Encounter (Signed)
  Follow up Call-  Call back number 06/13/2018  Post procedure Call Back phone  # [phone]  Permission to leave phone message Yes  Some recent data might be hidden     Patient questions:  Do you have a fever, pain , or abdominal swelling? No. Pain Score  0 *  Have you tolerated food without any problems? Yes.    Have you been able to return to your normal activities? Yes.    Do you have any questions about your discharge instructions: Diet   No. Medications  No. Follow up visit  No.  Do you have questions or concerns about your Care? No.  Actions: * If pain score is 4 or above: No action needed, pain <4.

## 2018-06-16 ENCOUNTER — Other Ambulatory Visit: Payer: Self-pay | Admitting: Family Medicine

## 2018-06-18 ENCOUNTER — Encounter: Payer: Self-pay | Admitting: Gastroenterology

## 2018-06-18 ENCOUNTER — Telehealth: Payer: Self-pay | Admitting: Family Medicine

## 2018-06-18 DIAGNOSIS — E11319 Type 2 diabetes mellitus with unspecified diabetic retinopathy without macular edema: Secondary | ICD-10-CM

## 2018-06-18 NOTE — Telephone Encounter (Signed)
-----   Message from Eustace Pen, LPN sent at 11/17/5914  9:19 AM EST ----- Regarding: labs 1/8 Lab orders needed. Thank you.  Insurance:  [employer]

## 2018-06-19 ENCOUNTER — Ambulatory Visit (INDEPENDENT_AMBULATORY_CARE_PROVIDER_SITE_OTHER): Payer: PPO

## 2018-06-19 VITALS — BP 118/70 | HR 51 | Temp 98.3°F | Ht 70.5 in | Wt 222.8 lb

## 2018-06-19 DIAGNOSIS — E11319 Type 2 diabetes mellitus with unspecified diabetic retinopathy without macular edema: Secondary | ICD-10-CM | POA: Diagnosis not present

## 2018-06-19 DIAGNOSIS — Z0000 Encounter for general adult medical examination without abnormal findings: Secondary | ICD-10-CM

## 2018-06-19 LAB — LIPID PANEL
CHOLESTEROL: 125 mg/dL (ref 0–200)
HDL: 34.3 mg/dL — ABNORMAL LOW (ref >39.00)
NonHDL: 90.96
Total CHOL/HDL Ratio: 4
Triglycerides: 242 mg/dL — ABNORMAL HIGH (ref 0.0–149.0)
VLDL: 48.4 mg/dL — ABNORMAL HIGH (ref 0.0–40.0)

## 2018-06-19 LAB — COMPREHENSIVE METABOLIC PANEL
ALK PHOS: 61 U/L (ref 39–117)
ALT: 15 U/L (ref 0–53)
AST: 18 U/L (ref 0–37)
Albumin: 4.1 g/dL (ref 3.5–5.2)
BUN: 18 mg/dL (ref 6–23)
CO2: 28 mEq/L (ref 19–32)
Calcium: 9.6 mg/dL (ref 8.4–10.5)
Chloride: 102 mEq/L (ref 96–112)
Creatinine, Ser: 1.14 mg/dL (ref 0.40–1.50)
GFR: 66.22 mL/min (ref >60.00)
Glucose, Bld: 131 mg/dL — ABNORMAL HIGH (ref 70–99)
Potassium: 4.1 mEq/L (ref 3.5–5.1)
Sodium: 138 mEq/L (ref 135–145)
TOTAL PROTEIN: 7.1 g/dL (ref 6.0–8.3)
Total Bilirubin: 0.6 mg/dL (ref 0.2–1.2)

## 2018-06-19 LAB — LDL CHOLESTEROL, DIRECT: Direct LDL: 64 mg/dL

## 2018-06-19 LAB — HEMOGLOBIN A1C: Hgb A1c MFr Bld: 6.8 % — ABNORMAL HIGH (ref 4.6–6.5)

## 2018-06-19 NOTE — Progress Notes (Signed)
I reviewed health advisor's note, was available for consultation, and agree with documentation and plan.   Signed,  Caliegh Middlekauff T. Maxx Calaway, MD  

## 2018-06-19 NOTE — Patient Instructions (Signed)
Mr. Isensee , Thank you for taking time to come for your Medicare Wellness Visit. I appreciate your ongoing commitment to your health goals. Please review the following plan we discussed and let me know if I can assist you in the future.   These are the goals we discussed: Goals    . DIET - INCREASE WATER INTAKE     Starting 06/19/2018, I will continue to drink at least 36 oz of water daily.        This is a list of the screening recommended for you and due dates:  Health Maintenance  Topic Date Due  . Eye exam for diabetics  09/11/2018  . Hemoglobin A1C  12/18/2018  . Complete foot exam   12/21/2018  . Urine Protein Check  12/21/2018  . Colon Cancer Screening  06/14/2023  . Tetanus Vaccine  12/10/2026  . Flu Shot  Completed  . Pneumonia vaccines  Completed   Preventive Care for Adults  A healthy lifestyle and preventive care can promote health and wellness. Preventive health guidelines for adults include the following key practices.  . A routine yearly physical is a good way to check with your health care provider about your health and preventive screening. It is a chance to share any concerns and updates on your health and to receive a thorough exam.  . Visit your dentist for a routine exam and preventive care every 6 months. Brush your teeth twice a day and floss once a day. Good oral hygiene prevents tooth decay and gum disease.  . The frequency of eye exams is based on your age, health, family medical history, use  of contact lenses, and other factors. Follow your health care provider's recommendations for frequency of eye exams.  . Eat a healthy diet. Foods like vegetables, fruits, whole grains, low-fat dairy products, and lean protein foods contain the nutrients you need without too many calories. Decrease your intake of foods high in solid fats, added sugars, and salt. Eat the right amount of calories for you. Get information about a proper diet from your health care provider,  if necessary.  . Regular physical exercise is one of the most important things you can do for your health. Most adults should get at least 150 minutes of moderate-intensity exercise (any activity that increases your heart rate and causes you to sweat) each week. In addition, most adults need muscle-strengthening exercises on 2 or more days a week.  Silver Sneakers may be a benefit available to you. To determine eligibility, you may visit the website: www.silversneakers.com or contact program at 210-430-8503 Mon-Fri between 8AM-8PM.   . Maintain a healthy weight. The body mass index (BMI) is a screening tool to identify possible weight problems. It provides an estimate of body fat based on height and weight. Your health care provider can find your BMI and can help you achieve or maintain a healthy weight.   For adults 20 years and older: ? A BMI below 18.5 is considered underweight. ? A BMI of 18.5 to 24.9 is normal. ? A BMI of 25 to 29.9 is considered overweight. ? A BMI of 30 and above is considered obese.   . Maintain normal blood lipids and cholesterol levels by exercising and minimizing your intake of saturated fat. Eat a balanced diet with plenty of fruit and vegetables. Blood tests for lipids and cholesterol should begin at age 64 and be repeated every 5 years. If your lipid or cholesterol levels are high, you are over  50, or you are at high risk for heart disease, you may need your cholesterol levels checked more frequently. Ongoing high lipid and cholesterol levels should be treated with medicines if diet and exercise are not working.  . If you smoke, find out from your health care provider how to quit. If you do not use tobacco, please do not start.  . If you choose to drink alcohol, please do not consume more than 2 drinks per day. One drink is considered to be 12 ounces (355 mL) of beer, 5 ounces (148 mL) of wine, or 1.5 ounces (44 mL) of liquor.  . If you are 21-22 years old, ask  your health care provider if you should take aspirin to prevent strokes.  . Use sunscreen. Apply sunscreen liberally and repeatedly throughout the day. You should seek shade when your shadow is shorter than you. Protect yourself by wearing long sleeves, pants, a wide-brimmed hat, and sunglasses year round, whenever you are outdoors.  . Once a month, do a whole body skin exam, using a mirror to look at the skin on your back. Tell your health care provider of new moles, moles that have irregular borders, moles that are larger than a pencil eraser, or moles that have changed in shape or color.

## 2018-06-19 NOTE — Progress Notes (Signed)
PCP notes:   Health maintenance:  A1C - completed  Abnormal screenings:   None  Patient concerns:   None  Nurse concerns:  None  Next PCP appt:   06/28/18 @ 1000

## 2018-06-19 NOTE — Progress Notes (Signed)
Subjective:   Miguel Bradley is a 78 y.o. male who presents for Medicare Annual/Subsequent preventive examination.  Review of Systems:  N/A Cardiac Risk Factors include: advanced age (>40men, >61 women);diabetes mellitus;obesity (BMI >30kg/m2);dyslipidemia;hypertension;male gender     Objective:    Vitals: BP 118/70 (BP Location: Right Arm, Patient Position: Sitting, Cuff Size: Normal)   Pulse (!) 51   Temp 98.3 F (36.8 C) (Oral)   Ht 5' 10.5" (1.791 m) Comment: shoes  Wt 222 lb 12 oz (101 kg)   SpO2 93%   BMI 31.51 kg/m   Body mass index is 31.51 kg/m.  Advanced Directives 06/19/2018 06/14/2017 12/09/2015  Does Patient Have a Medical Advance Directive? Yes Yes Yes  Type of Paramedic of [place];Living will [place];Living will [place];Living will  Does patient want to make changes to medical advance directive? - - No - Patient declined  Copy of [place] in Chart? No - copy requested Yes No - copy requested    Tobacco Social History   Tobacco Use  Smoking Status Former Smoker  . Years: 25.00  . Last attempt to quit: 06/12/1978  . Years since quitting: 40.0  Smokeless Tobacco Never Used     Counseling given: No   Clinical Intake:  Pre-visit preparation completed: Yes  Pain : No/denies pain Pain Score: 0-No pain     Nutritional Status: BMI > 30  Obese Nutritional Risks: None Diabetes: Yes CBG done?: No Did pt. bring in CBG monitor from home?: No  How often do you need to have someone help you when you read instructions, pamphlets, or other written materials from your doctor or pharmacy?: 1 - Never What is the last grade level you completed in school?: 12th grade  Interpreter Needed?: No  Comments: pt is a widower and lives alone Information entered by :: LPinson, LPN  Past Medical History:  Diagnosis Date  . Anemia   . Basal cell carcinoma of face   . Cataract   .  Diabetes mellitus without complication ([place])   . Glaucoma   . Hypercalcemia   . Hyperparathyroidism, unspecified ([place])   . Impotence of organic origin   . Other testicular hypofunction   . Pure hyperglyceridemia   . Special screening for malignant neoplasm of prostate   . Special screening for malignant neoplasms, colon   . Unspecified essential hypertension    Past Surgical History:  Procedure Laterality Date  . COLONOSCOPY  06/13/2018   Dr. Lucio Edward  . PARATHYROIDECTOMY  2012  . skin cancer removal     Dr. Sharlett Iles  . TRIGGER FINGER RELEASE Left 2008   Family History  Problem Relation Age of Onset  . Hypertension Mother   . Dementia Mother   . Diabetes Other        aunt  . Hypertension Brother   . Hypertension Brother   . Hypertension Brother   . Hyperlipidemia Brother   . Hyperlipidemia Brother   . Hyperlipidemia Brother   . Hypertension Sister   . Hyperlipidemia Sister   . Colon cancer Neg Hx   . Esophageal cancer Neg Hx   . Rectal cancer Neg Hx   . Stomach cancer Neg Hx    Social History   Socioeconomic History  . Marital status: Married    Spouse name: Not on file  . Number of children: 1  . Years of education: Not on file  . Highest education level: Not on  file  Occupational History  . Occupation: Water quality scientist at [employer]  . Financial resource strain: Not on file  . Food insecurity:    Worry: Not on file    Inability: Not on file  . Transportation needs:    Medical: Not on file    Non-medical: Not on file  Tobacco Use  . Smoking status: Former Smoker    Years: 25.00    Last attempt to quit: 06/12/1978    Years since quitting: 40.0  . Smokeless tobacco: Never Used  Substance and Sexual Activity  . Alcohol use: Yes    Comment: 1 beer every few weeks  . Drug use: No  . Sexual activity: Not Currently  Lifestyle  . Physical activity:    Days per week: Not on file    Minutes per session: Not on file  . Stress: Not on file    Relationships  . Social connections:    Talks on phone: Not on file    Gets together: Not on file    Attends religious service: Not on file    Active member of club or organization: Not on file    Attends meetings of clubs or organizations: Not on file    Relationship status: Not on file  Other Topics Concern  . Not on file  Social History Narrative   No exercise, plays golf.   Poor diet.    Outpatient Encounter Medications as of 06/19/2018  Medication Sig  . aspirin EC 81 MG tablet Take 81 mg by mouth daily.  . Cholecalciferol (VITAMIN D) 2000 UNITS tablet Take 2,000 Units by mouth daily.    . dorzolamide-timolol (COSOPT) 22.3-6.8 MG/ML ophthalmic solution   . fenofibrate 160 MG tablet TAKE ONE TABLET BY MOUTH EVERY DAY  . hydrochlorothiazide (HYDRODIURIL) 25 MG tablet TAKE ONE TABLET BY MOUTH EVERY DAY  . latanoprost (XALATAN) 0.005 % ophthalmic solution   . MEGARED OMEGA-3 KRILL OIL PO Take 1 capsule by mouth daily.  . metFORMIN (GLUCOPHAGE) 500 MG tablet TAKE ONE TABLET BY MOUTH TWICE DAILY WITH A MEAL  . rosuvastatin (CRESTOR) 20 MG tablet TAKE 1 TABLET BY MOUTH EVERY DAY   No facility-administered encounter medications on file as of 06/19/2018.     Activities of Daily Living In your present state of health, do you have any difficulty performing the following activities: 06/19/2018  Hearing? Y  Vision? N  Difficulty concentrating or making decisions? N  Walking or climbing stairs? N  Dressing or bathing? N  Doing errands, shopping? N  Preparing Food and eating ? N  Using the Toilet? N  In the past six months, have you accidently leaked urine? N  Do you have problems with loss of bowel control? N  Managing your Medications? N  Managing your Finances? N  Housekeeping or managing your Housekeeping? N  Some recent data might be hidden    Patient Care Team: Jinny Sanders, MD as PCP - General Hayden Pedro, MD as Consulting Physician (Ophthalmology) Lorelee Cover.,  MD as Referring Physician (Ophthalmology) Oneta Rack, MD as Referring Physician (Dermatology) Ladene Artist, MD as Consulting Physician (Gastroenterology) Rosalene Billings., MD as Referring Physician (Dentistry)   Assessment:   This is a routine wellness examination for Miguel Bradley.  Exercise Activities and Dietary recommendations Current Exercise Habits: The patient does not participate in regular exercise at present, Exercise limited by: None identified  Goals    . DIET - INCREASE WATER INTAKE  Starting 06/19/2018, I will continue to drink at least 36 oz of water daily.        Fall Risk Fall Risk  06/19/2018 06/14/2017 12/15/2016 12/09/2015 12/08/2014  Falls in the past year? 0 No Yes No No  Number falls in past yr: - - 1 - -  Injury with Fall? - - Yes - -   Depression Screen PHQ 2/9 Scores 06/19/2018 06/14/2017 12/15/2016 12/09/2015  PHQ - 2 Score 0 0 0 0  PHQ- 9 Score 0 0 - -    Cognitive Function MMSE - Mini Mental State Exam 06/19/2018 06/14/2017  Orientation to time 5 5  Orientation to Place 5 5  Registration 3 3  Attention/ Calculation 0 0  Recall 3 3  Language- name 2 objects 0 0  Language- repeat 1 1  Language- follow 3 step command 3 3  Language- read & follow direction 0 0  Write a sentence 0 0  Copy design 0 0  Total score 20 20     PLEASE NOTE: A Mini-Cog screen was completed. Maximum score is 20. A value of 0 denotes this part of [place] MMSE was not completed or the patient failed this part of the Mini-Cog screening.   Mini-Cog Screening Orientation to Time - Max 5 pts Orientation to Place - Max 5 pts Registration - Max 3 pts Recall - Max 3 pts Language Repeat - Max 1 pts Language Follow 3 Step Command - Max 3 pts     Immunization History  Administered Date(s) Administered  . Influenza, High Dose Seasonal PF 03/26/2017  . Influenza,inj,Quad PF,6+ Mos 04/22/2015, 02/28/2016, 03/12/2018  . Pneumococcal Conjugate-13 12/08/2014  . Pneumococcal  Polysaccharide-23 11/16/2009  . Td 11/16/2009, 12/09/2016  . Tdap 06/10/2015   Screening Tests Health Maintenance  Topic Date Due  . OPHTHALMOLOGY EXAM  09/11/2018  . HEMOGLOBIN A1C  12/18/2018  . FOOT EXAM  12/21/2018  . URINE MICROALBUMIN  12/21/2018  . COLONOSCOPY  06/14/2023  . TETANUS/TDAP  12/10/2026  . INFLUENZA VACCINE  Completed  . PNA vac Low Risk Adult  Completed       Plan:     I have personally reviewed, addressed, and noted the following in the patient's chart:  A. Medical and social history B. Use of alcohol, tobacco or illicit drugs  C. Current medications and supplements D. Functional ability and status E.  Nutritional status F.  Physical activity G. Advance directives H. List of other physicians I.  Hospitalizations, surgeries, and ER visits in previous 12 months J.  [place] to include hearing, vision, cognitive, depression L. Referrals and appointments - none  In addition, I have reviewed and discussed with patient certain preventive protocols, quality metrics, and best practice recommendations. A written personalized care plan for preventive services as well as general preventive health recommendations were provided to patient.  See attached scanned questionnaire for additional information.   Signed,   Lindell Noe, MHA, BS, LPN Health Coach

## 2018-06-28 ENCOUNTER — Ambulatory Visit (INDEPENDENT_AMBULATORY_CARE_PROVIDER_SITE_OTHER): Payer: PPO | Admitting: Family Medicine

## 2018-06-28 ENCOUNTER — Encounter: Payer: Self-pay | Admitting: Family Medicine

## 2018-06-28 VITALS — BP 130/70 | HR 51 | Temp 98.3°F | Ht 70.5 in | Wt 224.0 lb

## 2018-06-28 DIAGNOSIS — E113393 Type 2 diabetes mellitus with moderate nonproliferative diabetic retinopathy without macular edema, bilateral: Secondary | ICD-10-CM

## 2018-06-28 DIAGNOSIS — E1142 Type 2 diabetes mellitus with diabetic polyneuropathy: Secondary | ICD-10-CM | POA: Diagnosis not present

## 2018-06-28 DIAGNOSIS — E11319 Type 2 diabetes mellitus with unspecified diabetic retinopathy without macular edema: Secondary | ICD-10-CM | POA: Diagnosis not present

## 2018-06-28 DIAGNOSIS — I10 Essential (primary) hypertension: Secondary | ICD-10-CM

## 2018-06-28 DIAGNOSIS — Z0000 Encounter for general adult medical examination without abnormal findings: Secondary | ICD-10-CM | POA: Diagnosis not present

## 2018-06-28 LAB — HM DIABETES FOOT EXAM

## 2018-06-28 NOTE — Patient Instructions (Addendum)
Try to walk more, get more active. Keep up working on healthy diet.

## 2018-06-28 NOTE — Assessment & Plan Note (Signed)
Stable yearly eye exam.

## 2018-06-28 NOTE — Assessment & Plan Note (Signed)
Well controlled. Continue current medication.  

## 2018-06-28 NOTE — Progress Notes (Signed)
Subjective:    Patient ID: Miguel Bradley, male    DOB: May 24, 1941, 78 y.o.   MRN: [id number]  HPI The patient presents for  complete physical and review of chronic health problems. He/She also has the following acute concerns today:  In last few years he has noted that feel like feet going to sleep. Top and bottom of foot bilaterally. No balance issues. Notes more at night. Better with walking. Feels like walking on a cushion. No pain or burning.  The patient saw Candis Musa, LPN for medicare wellness. Note reviewed in detail and important notes copied below.  Health maintenance:  A1C - completed  Abnormal screenings:   None  Patient concerns:   None  Nurse concerns:  None  06/28/18 Hypertension:   good control.  Using medication without problems or lightheadedness:  none Chest pain with exertion:none Edema:none Short of breath:none Average home BPs: Other issues:  Elevated Cholesterol:  LDL at goal on crestor Lab Results  Component Value Date   CHOL 125 06/19/2018   HDL 34.30 (L) 06/19/2018   LDLCALC 54 11/28/2013   LDLDIRECT 64.0 06/19/2018   TRIG 242.0 (H) 06/19/2018   CHOLHDL 4 06/19/2018  Using medications without problems: Muscle aches:  Diet compliance: moderate Exercise: Other complaints:  Diabetes:   Improved control on metformin Lab Results  Component Value Date   HGBA1C 6.8 (H) 06/19/2018  Using medications without difficulties: Hypoglycemic episodes:none Hyperglycemic episodes:none Feet problems:no ulcers Blood Sugars averaging: eye exam within last year: 09/2017    Chronic insomnia: neg PHQ2... he does feel like he been sadder lately. Feels alone, does not do much during day, sits in chair.  volunteers at hospital.  he does not want a medication.   Social History /Family History/Past Medical History reviewed in detail and updated in EMR if needed. Blood pressure 130/70, pulse (!) 51, temperature 98.3 F (36.8 C),  temperature source Oral, height 5' 10.5" (1.791 m), weight 224 lb (101.6 kg).  Review of Systems  Constitutional: Negative for fatigue and fever.  HENT: Negative for ear pain.   Eyes: Negative for pain.  Respiratory: Negative for cough and shortness of breath.   Cardiovascular: Negative for chest pain, palpitations and leg swelling.  Gastrointestinal: Negative for abdominal pain.  Genitourinary: Negative for dysuria.  Musculoskeletal: Negative for arthralgias.  Neurological: Negative for syncope, light-headedness and headaches.  Psychiatric/Behavioral: Negative for dysphoric mood.       Objective:   Physical Exam Constitutional:      General: He is not in acute distress.    Appearance: Normal appearance. He is well-developed. He is obese. He is not ill-appearing or toxic-appearing.  HENT:     Head: Normocephalic and atraumatic.     Right Ear: Hearing, tympanic membrane, ear canal and external ear normal.     Left Ear: Hearing, tympanic membrane, ear canal and external ear normal.     Nose: Nose normal.     Mouth/Throat:     Pharynx: Uvula midline.  Eyes:     General: Lids are normal. Lids are everted, no foreign bodies appreciated.     Conjunctiva/sclera: Conjunctivae normal.     Pupils: Pupils are equal, round, and reactive to light.  Neck:     Musculoskeletal: Normal range of motion and neck supple.     Thyroid: No thyroid mass or thyromegaly.     Vascular: No carotid bruit.     Trachea: Trachea and phonation normal.  Cardiovascular:     Rate and Rhythm:  Normal rate and regular rhythm.     Pulses: Normal pulses.     Heart sounds: S1 normal and S2 normal. No murmur. No gallop.   Pulmonary:     Breath sounds: Normal breath sounds. No wheezing, rhonchi or rales.  Abdominal:     General: Bowel sounds are normal.     Palpations: Abdomen is soft.     Tenderness: There is no abdominal tenderness. There is no guarding or rebound.     Hernia: No hernia is present.    Lymphadenopathy:     Cervical: No cervical adenopathy.  Skin:    General: Skin is warm and dry.     Findings: No rash.  Neurological:     Mental Status: He is alert.     Cranial Nerves: No cranial nerve deficit.     Sensory: No sensory deficit.     Gait: Gait normal.     Deep Tendon Reflexes: Reflexes are normal and symmetric.  Psychiatric:        Speech: Speech normal.        Behavior: Behavior normal.        Judgment: Judgment normal.      Diabetic foot exam: Normal inspection No skin breakdown No calluses  Normal DP pulses Normal sensation to light touch and monofilament Nails normal      Assessment & Plan:  The patient's preventative maintenance and recommended screening tests for an annual wellness exam were reviewed in full today. Brought up to date unless services declined.  Counselled on the importance of diet, exercise, and its role in overall health and mortality. The patient's FH and SH was reviewed, including their home life, tobacco status, and drug and alcohol status.   Vaccines: PNA and Td uptodate, refuses shingles vaccine...says he has never had chicken pox  Former smoker.Quit 34 years ago.  Colon: 06/2018 Dr. Fuller Plan, tubular adenoma, repeat 5 years if healthy. Prostate: not indicated after age 18.

## 2018-06-28 NOTE — Assessment & Plan Note (Signed)
Stable nonpainful.

## 2018-06-28 NOTE — Assessment & Plan Note (Signed)
Improved control with diet on metformin.

## 2018-07-24 DIAGNOSIS — H40153 Residual stage of open-angle glaucoma, bilateral: Secondary | ICD-10-CM | POA: Diagnosis not present

## 2018-07-26 ENCOUNTER — Other Ambulatory Visit: Payer: Self-pay | Admitting: Family Medicine

## 2018-09-24 ENCOUNTER — Ambulatory Visit (INDEPENDENT_AMBULATORY_CARE_PROVIDER_SITE_OTHER): Payer: PPO | Admitting: Family Medicine

## 2018-09-24 ENCOUNTER — Encounter: Payer: Self-pay | Admitting: Family Medicine

## 2018-09-24 DIAGNOSIS — J301 Allergic rhinitis due to pollen: Secondary | ICD-10-CM | POA: Diagnosis not present

## 2018-09-24 DIAGNOSIS — M549 Dorsalgia, unspecified: Secondary | ICD-10-CM

## 2018-09-24 DIAGNOSIS — R0789 Other chest pain: Secondary | ICD-10-CM | POA: Diagnosis not present

## 2018-09-24 NOTE — Assessment & Plan Note (Signed)
Heat, gentle stretches, tylenol as needed for pain.

## 2018-09-24 NOTE — Progress Notes (Signed)
VIRTUAL VISIT Due to national recommendations of social distancing due to [place] 19, a virtual visit is felt to be most appropriate for this patient at this time.   Interactive audio and video telecommunications were attempted between this provider and patient, however failed, due to patient having technical difficulties OR patient did not have access to video capability.  We continued and completed visit with audio only.    I connected with the patient on 09/24/18 at  9:30 AM EDT by virtual telehealth platform and verified that I am speaking with the correct person using two identifiers.   I discussed the limitations, risks, security and privacy concerns of performing an evaluation and management service by  virtual telehealth platform and the availability of in person appointments. I also discussed with the patient that there may be a patient responsible charge related to this service. The patient expressed understanding and agreed to proceed.  Patient location: Home Provider Location: [facility] Participants: Eliezer Lofts and [name]   Chief Complaint  Patient presents with  . Cough    History of Present Illness: 78 year old male pt presents for new onset congestion as well as right sided pain.  He first noted 3 weeks of soreness over right side of rib cage and upper back near right shoulder... after playing golf. Mild pain 1-2 on pain scale.    No change with exertion, breathing, bending over.. pain increased some with raising and lowering arm.. pulling.  No ttp over ribs.   No SOB, no wheeze He feels fine overall.  He has also noted a lot of nasal congestion, post nasal drip.  Minimal cough, about once a day from tickle in throat.  No fever. He is taking  OTC allergy med..ceterizine  no rash.   no ttp over vertebrae in center, no numbness, no lower extremity weakness.  COVID 19 screen No recent travel or known exposure to [place]  The importance of social  distancing was discussed today.   ROS    Past Medical History:  Diagnosis Date  . Anemia   . Basal cell carcinoma of face   . Cataract   . Diabetes mellitus without complication ([place])   . Glaucoma   . Hypercalcemia   . Hyperparathyroidism, unspecified ([place])   . Impotence of organic origin   . Other testicular hypofunction   . Pure hyperglyceridemia   . Special screening for malignant neoplasm of prostate   . Special screening for malignant neoplasms, colon   . Unspecified essential hypertension     reports that he quit smoking about 40 years ago. He quit after 25.00 years of use. He has never used smokeless tobacco. He reports current alcohol use. He reports that he does not use drugs.   Current Outpatient Medications:  .  aspirin EC 81 MG tablet, Take 81 mg by mouth daily., Disp: , Rfl:  .  Cholecalciferol (VITAMIN D) 2000 UNITS tablet, Take 2,000 Units by mouth daily.  , Disp: , Rfl:  .  dorzolamide-timolol (COSOPT) 22.3-6.8 MG/ML ophthalmic solution, , Disp: , Rfl:  .  fenofibrate 160 MG tablet, TAKE ONE TABLET BY MOUTH EVERY DAY, Disp: 90 tablet, Rfl: 2 .  hydrochlorothiazide (HYDRODIURIL) 25 MG tablet, TAKE ONE TABLET BY MOUTH EVERY DAY, Disp: 90 tablet, Rfl: 3 .  latanoprost (XALATAN) 0.005 % ophthalmic solution, , Disp: , Rfl:  .  MEGARED OMEGA-3 KRILL OIL PO, Take 1 capsule by mouth daily., Disp: , Rfl:  .  metFORMIN (GLUCOPHAGE)  500 MG tablet, TAKE ONE TABLET BY MOUTH TWICE DAILY WITH A MEAL, Disp: 180 tablet, Rfl: 1 .  rosuvastatin (CRESTOR) 20 MG tablet, TAKE 1 TABLET BY MOUTH EVERY DAY, Disp: 90 tablet, Rfl: 3   Observations/Objective: There were no vitals taken for this visit.  Physical Exam  Physical Exam Constitutional:      General: He is not in acute distress.  Psychiatric:        Mood and Affect: Mood normal.        Behavior: Behavior normal.   Assessment and Plan Upper back pain on right side  Heat, gentle stretches, tylenol as needed for  pain.  Right-sided chest wall pain Likely MSK in origin. No red flags for cardiopulmonary acute issue.   Seasonal allergic rhinitis due to pollen Add flonase to antihistamine.. likely cause of mild intermittent cough. No S/S of COVID 19.    I discussed the assessment and treatment plan with the patient. The patient was provided an opportunity to ask questions and all were answered. The patient agreed with the plan and demonstrated an understanding of the instructions.   The patient was advised to call back or seek an in-person evaluation if the symptoms worsen or if the condition fails to improve as anticipated.     Eliezer Lofts, MD

## 2018-09-24 NOTE — Assessment & Plan Note (Signed)
Add flonase to antihistamine.. likely cause of mild intermittent cough.

## 2018-09-24 NOTE — Progress Notes (Signed)
AVS and Upper Back Stretches mail to patient as instructed by Dr. Diona Browner

## 2018-09-24 NOTE — Assessment & Plan Note (Signed)
Likely MSK in origin. No red flags for cardiopulmonary acute issue.

## 2018-09-24 NOTE — Patient Instructions (Addendum)
Start heat and gentle stretching on right side and right upper back. Can start flonase 2 sprays as needed for allergy.  Call if not improving in 2 weeks or sooner if chest pain with exertion, shortness of breath or fever.

## 2018-10-08 ENCOUNTER — Encounter (INDEPENDENT_AMBULATORY_CARE_PROVIDER_SITE_OTHER): Payer: PPO | Admitting: Ophthalmology

## 2018-11-05 DIAGNOSIS — H40153 Residual stage of open-angle glaucoma, bilateral: Secondary | ICD-10-CM | POA: Diagnosis not present

## 2018-12-02 ENCOUNTER — Other Ambulatory Visit: Payer: Self-pay | Admitting: Family Medicine

## 2018-12-10 ENCOUNTER — Encounter (INDEPENDENT_AMBULATORY_CARE_PROVIDER_SITE_OTHER): Payer: PPO | Admitting: Ophthalmology

## 2018-12-10 ENCOUNTER — Other Ambulatory Visit: Payer: Self-pay

## 2018-12-10 DIAGNOSIS — H35033 Hypertensive retinopathy, bilateral: Secondary | ICD-10-CM

## 2018-12-10 DIAGNOSIS — H35372 Puckering of macula, left eye: Secondary | ICD-10-CM | POA: Diagnosis not present

## 2018-12-10 DIAGNOSIS — H43813 Vitreous degeneration, bilateral: Secondary | ICD-10-CM | POA: Diagnosis not present

## 2018-12-10 DIAGNOSIS — I10 Essential (primary) hypertension: Secondary | ICD-10-CM

## 2018-12-10 LAB — HM DIABETES EYE EXAM

## 2018-12-12 ENCOUNTER — Other Ambulatory Visit: Payer: Self-pay | Admitting: Family Medicine

## 2018-12-24 ENCOUNTER — Telehealth: Payer: Self-pay | Admitting: Family Medicine

## 2018-12-24 ENCOUNTER — Other Ambulatory Visit (INDEPENDENT_AMBULATORY_CARE_PROVIDER_SITE_OTHER): Payer: PPO

## 2018-12-24 ENCOUNTER — Other Ambulatory Visit: Payer: Self-pay

## 2018-12-24 DIAGNOSIS — E11319 Type 2 diabetes mellitus with unspecified diabetic retinopathy without macular edema: Secondary | ICD-10-CM

## 2018-12-24 LAB — COMPREHENSIVE METABOLIC PANEL
ALT: 13 U/L (ref 0–53)
AST: 15 U/L (ref 0–37)
Albumin: 4.1 g/dL (ref 3.5–5.2)
Alkaline Phosphatase: 60 U/L (ref 39–117)
BUN: 22 mg/dL (ref 6–23)
CO2: 29 mEq/L (ref 19–32)
Calcium: 9.3 mg/dL (ref 8.4–10.5)
Chloride: 100 mEq/L (ref 96–112)
Creatinine, Ser: 1.22 mg/dL (ref 0.40–1.50)
GFR: 57.54 mL/min — ABNORMAL LOW (ref >60.00)
Glucose, Bld: 140 mg/dL — ABNORMAL HIGH (ref 70–99)
Potassium: 4.6 mEq/L (ref 3.5–5.1)
Sodium: 135 mEq/L (ref 135–145)
Total Bilirubin: 0.5 mg/dL (ref 0.2–1.2)
Total Protein: 6.5 g/dL (ref 6.0–8.3)

## 2018-12-24 LAB — LIPID PANEL
Cholesterol: 108 mg/dL (ref 0–200)
HDL: 32.7 mg/dL — ABNORMAL LOW (ref >39.00)
NonHDL: 75.54
Total CHOL/HDL Ratio: 3
Triglycerides: 207 mg/dL — ABNORMAL HIGH (ref 0.0–149.0)
VLDL: 41.4 mg/dL — ABNORMAL HIGH (ref 0.0–40.0)

## 2018-12-24 LAB — LDL CHOLESTEROL, DIRECT: Direct LDL: 60 mg/dL

## 2018-12-24 LAB — MICROALBUMIN / CREATININE URINE RATIO
Creatinine,U: 94.4 mg/dL
Microalb Creat Ratio: 0.9 mg/g (ref 0.0–30.0)
Microalb, Ur: 0.9 mg/dL (ref 0.0–1.9)

## 2018-12-24 LAB — HEMOGLOBIN A1C: Hgb A1c MFr Bld: 6.8 % — ABNORMAL HIGH (ref 4.6–6.5)

## 2018-12-24 NOTE — Telephone Encounter (Signed)
-----   Message from Ellamae Sia sent at 12/18/2018  4:21 PM EDT ----- Regarding: lab orders for Tuesday, 7.14.20 Patient is scheduled for CPX labs, please order future labs, Thanks , Karna Christmas

## 2018-12-31 ENCOUNTER — Other Ambulatory Visit: Payer: Self-pay

## 2018-12-31 ENCOUNTER — Encounter: Payer: Self-pay | Admitting: Family Medicine

## 2018-12-31 ENCOUNTER — Ambulatory Visit (INDEPENDENT_AMBULATORY_CARE_PROVIDER_SITE_OTHER): Payer: PPO | Admitting: Family Medicine

## 2018-12-31 VITALS — BP 134/70 | HR 57 | Temp 98.7°F | Ht 70.5 in | Wt 226.8 lb

## 2018-12-31 DIAGNOSIS — I10 Essential (primary) hypertension: Secondary | ICD-10-CM

## 2018-12-31 DIAGNOSIS — E11319 Type 2 diabetes mellitus with unspecified diabetic retinopathy without macular edema: Secondary | ICD-10-CM | POA: Diagnosis not present

## 2018-12-31 DIAGNOSIS — E113393 Type 2 diabetes mellitus with moderate nonproliferative diabetic retinopathy without macular edema, bilateral: Secondary | ICD-10-CM

## 2018-12-31 DIAGNOSIS — E7800 Pure hypercholesterolemia, unspecified: Secondary | ICD-10-CM

## 2018-12-31 LAB — HM DIABETES FOOT EXAM

## 2018-12-31 NOTE — Progress Notes (Signed)
Chief Complaint  Patient presents with  . Diabetes    Here for 6 mo f/u.    History of Present Illness: HPI  78 year old male presents for follow up Dm  Diabetes:  Stable control with metfomrin Lab Results  Component Value Date   HGBA1C 6.8 (H) 12/24/2018  Using medications without difficulties: Hypoglycemic episodes: Hyperglycemic episodes: Feet problems: no ulcer Blood Sugars averaging: eye exam within last year:  Elevated Cholesterol:  LDL at goal on crestor and fenofibrate. Lab Results  Component Value Date   CHOL 108 12/24/2018   HDL 32.70 (L) 12/24/2018   LDLCALC 54 11/28/2013   LDLDIRECT 60.0 12/24/2018   TRIG 207.0 (H) 12/24/2018   CHOLHDL 3 12/24/2018  Using medications without problems: Muscle aches:  Diet compliance: poor Exercise: occ Other complaints:  Hypertension:   Great control on HCTZ   BP Readings from Last 3 Encounters:  12/31/18 134/70  06/28/18 130/70  06/19/18 118/70  Using medication without problems or lightheadedness: none Chest pain with exertion: none Edema:none Short of breath:none Average home BPs: Other issues:  Wt Readings from Last 3 Encounters:  12/31/18 226 lb 12 oz (102.9 kg)  06/28/18 224 lb (101.6 kg)  06/19/18 222 lb 12 oz (101 kg)  Body mass index is 32.08 kg/m.    COVID 19 screen No recent travel or known exposure to COVID19 The patient denies respiratory symptoms of COVID 19 at this time.  The importance of social distancing was discussed today.   Review of Systems  Constitutional: Negative for chills and fever.  HENT: Negative for congestion and ear pain.   Eyes: Negative for pain and redness.  Respiratory: Negative for cough and shortness of breath.   Cardiovascular: Negative for chest pain, palpitations and leg swelling.  Gastrointestinal: Negative for abdominal pain, blood in stool, constipation, diarrhea, nausea and vomiting.  Genitourinary: Negative for dysuria.  Musculoskeletal: Negative for falls  and myalgias.  Skin: Negative for rash.  Neurological: Negative for dizziness.  Psychiatric/Behavioral: Negative for depression. The patient is not nervous/anxious.       Past Medical History:  Diagnosis Date  . Anemia   . Basal cell carcinoma of face   . Cataract   . Diabetes mellitus without complication ([place])   . Glaucoma   . Hypercalcemia   . Hyperparathyroidism, unspecified ([place])   . Impotence of organic origin   . Other testicular hypofunction   . Pure hyperglyceridemia   . Special screening for malignant neoplasm of prostate   . Special screening for malignant neoplasms, colon   . Unspecified essential hypertension     reports that he quit smoking about 40 years ago. He quit after 25.00 years of use. He has never used smokeless tobacco. He reports current alcohol use. He reports that he does not use drugs.   Current Outpatient Medications:  .  aspirin EC 81 MG tablet, Take 81 mg by mouth daily., Disp: , Rfl:  .  Cholecalciferol (VITAMIN D) 2000 UNITS tablet, Take 2,000 Units by mouth daily.  , Disp: , Rfl:  .  dorzolamide-timolol (COSOPT) 22.3-6.8 MG/ML ophthalmic solution, , Disp: , Rfl:  .  fenofibrate 160 MG tablet, TAKE 1 TABLET BY MOUTH EVERY DAY, Disp: 90 tablet, Rfl: 2 .  hydrochlorothiazide (HYDRODIURIL) 25 MG tablet, TAKE ONE TABLET BY MOUTH EVERY DAY, Disp: 90 tablet, Rfl: 3 .  latanoprost (XALATAN) 0.005 % ophthalmic solution, , Disp: , Rfl:  .  MEGARED OMEGA-3 KRILL OIL PO, Take 1 capsule by mouth  daily., Disp: , Rfl:  .  metFORMIN (GLUCOPHAGE) 500 MG tablet, TAKE ONE TABLET BY MOUTH TWICE DAILY WITH A MEAL, Disp: 180 tablet, Rfl: 1 .  rosuvastatin (CRESTOR) 20 MG tablet, TAKE 1 TABLET BY MOUTH EVERY DAY, Disp: 90 tablet, Rfl: 3   Observations/Objective: Blood pressure 134/70, pulse (!) 57, temperature 98.7 F (37.1 C), temperature source Temporal, height 5' 10.5" (1.791 m), weight 226 lb 12 oz (102.9 kg), SpO2 95 %.  Physical Exam Constitutional:       Appearance: He is well-developed. He is obese.  HENT:     Head: Normocephalic.     Right Ear: Hearing normal.     Left Ear: Hearing normal.     Nose: Nose normal.  Neck:     Thyroid: No thyroid mass or thyromegaly.     Vascular: No carotid bruit.     Trachea: Trachea normal.  Cardiovascular:     Rate and Rhythm: Normal rate and regular rhythm.     Pulses: Normal pulses.     Heart sounds: Heart sounds not distant. No murmur. No friction rub. No gallop.      Comments: No peripheral edema Pulmonary:     Effort: Pulmonary effort is normal. No respiratory distress.     Breath sounds: Normal breath sounds.  Skin:    General: Skin is warm and dry.     Findings: No rash.  Psychiatric:        Speech: Speech normal.        Behavior: Behavior normal.        Thought Content: Thought content normal.     Diabetic foot exam: Normal inspection No skin breakdown No calluses  Normal DP pulses Normal sensation to light touch and monofilament Nails normal  Assessment and Plan      Eliezer Lofts, MD

## 2018-12-31 NOTE — Assessment & Plan Note (Signed)
Due for eye exam.

## 2018-12-31 NOTE — Assessment & Plan Note (Signed)
Well controlled. Continue current medication.  

## 2018-12-31 NOTE — Patient Instructions (Addendum)
Increase exercise and weight loss. Increase water as able.

## 2019-01-06 ENCOUNTER — Encounter: Payer: Self-pay | Admitting: Family Medicine

## 2019-01-20 DIAGNOSIS — H903 Sensorineural hearing loss, bilateral: Secondary | ICD-10-CM | POA: Diagnosis not present

## 2019-02-16 ENCOUNTER — Other Ambulatory Visit: Payer: Self-pay | Admitting: Family Medicine

## 2019-03-27 DIAGNOSIS — H40153 Residual stage of open-angle glaucoma, bilateral: Secondary | ICD-10-CM | POA: Diagnosis not present

## 2019-05-05 ENCOUNTER — Telehealth: Payer: Self-pay

## 2019-05-05 DIAGNOSIS — R9439 Abnormal result of other cardiovascular function study: Secondary | ICD-10-CM

## 2019-05-05 NOTE — Telephone Encounter (Signed)
Left message for Miguel Bradley to return my call.  I advised that we are going to need [facility] to send [place] his results for Dr. Diona Browner to review before any test can be ordered.  I ask that he call me back so we can talk about it.

## 2019-05-05 NOTE — Telephone Encounter (Signed)
Patient returned Donna's call.  Patient can be reached at [phone].

## 2019-05-05 NOTE — Telephone Encounter (Signed)
Patient states that he completed an electronic [facility] screening. He states that they ran some tests and bloodwork and he received a phone call stating that he needs to speak with his physician in regards to his left carotid artery disease, and states that he needs to have a right kidney US as well. Patient declined an appt at this time, but would like to speak with Butch Penny or Dr. Diona Browner about these concerns.

## 2019-05-05 NOTE — Telephone Encounter (Signed)
Spoke with Mr. Miguel Bradley.  He has report in hand and will bring it by the office tomorrow so I can make a copy for Dr. Diona Browner to review.

## 2019-05-06 NOTE — Telephone Encounter (Signed)
Patient dropped off [facility] Screening Results for Dr. Diona Browner to review.  I did have R. Baity, NP to look over to make sure there was nothing urgent that needed to be addressed before Dr. Rometta Emery return to office next week.  She noted that patient will need follow up for carotid artery stenosis.  Report shows significant carotid artery disease.   LDL 60, Triglycerides 202 on rosuvastatin, fenofibrate and ASA.  No carotid U/S in Epic.   For Kidney disease:  eGFR is 60 which is up from 57.54 in 12/2018.  Patient is on HCTZ and Metformin.  Rollene Fare felt this could wait for Dr. Diona Browner.  Will forward message to Dr. Diona Browner as Juluis Rainier.  [facility] results placed in her in box in her office for review.

## 2019-05-06 NOTE — Telephone Encounter (Signed)
Noted. Will review upon return.

## 2019-05-07 ENCOUNTER — Other Ambulatory Visit: Payer: Self-pay

## 2019-05-14 DIAGNOSIS — Z85828 Personal history of other malignant neoplasm of skin: Secondary | ICD-10-CM | POA: Diagnosis not present

## 2019-05-14 DIAGNOSIS — Z08 Encounter for follow-up examination after completed treatment for malignant neoplasm: Secondary | ICD-10-CM | POA: Diagnosis not present

## 2019-05-14 DIAGNOSIS — L821 Other seborrheic keratosis: Secondary | ICD-10-CM | POA: Diagnosis not present

## 2019-05-14 DIAGNOSIS — D2239 Melanocytic nevi of other parts of face: Secondary | ICD-10-CM | POA: Diagnosis not present

## 2019-05-19 NOTE — Telephone Encounter (Signed)
Spoke with Miguel Bradley.  Looks like report is still in Dr. Rometta Emery desk in box to review.  I advised him that Dr. Diona Browner will not be back in the office this week until Thursday but I would send her another note to make sure she reviews the report on Thursday and I will give him a call back as soon as I can with Dr. Rometta Emery recommendations.

## 2019-05-19 NOTE — Telephone Encounter (Signed)
Patient is calling back to follow up on request. He stated that he has been working with Butch Penny on this and would like to speak with you when you have a chance today

## 2019-05-20 NOTE — Telephone Encounter (Signed)
I will review on 12/10 upon my return to office.

## 2019-05-22 NOTE — Telephone Encounter (Signed)
Reviewed  [facility] screening.. will set pt up for carotid US.  Nonurgent.

## 2019-05-22 NOTE — Addendum Note (Signed)
Addended by: Eliezer Lofts E on: 05/22/2019 05:10 PM   Modules accepted: Orders

## 2019-05-23 NOTE — Telephone Encounter (Signed)
Miguel Bradley notified as instructed by telephone.

## 2019-06-16 ENCOUNTER — Other Ambulatory Visit: Payer: Self-pay

## 2019-06-16 ENCOUNTER — Ambulatory Visit (INDEPENDENT_AMBULATORY_CARE_PROVIDER_SITE_OTHER): Payer: PPO

## 2019-06-16 DIAGNOSIS — R9439 Abnormal result of other cardiovascular function study: Secondary | ICD-10-CM | POA: Diagnosis not present

## 2019-06-19 ENCOUNTER — Other Ambulatory Visit: Payer: Self-pay | Admitting: Family Medicine

## 2019-06-19 DIAGNOSIS — I6523 Occlusion and stenosis of bilateral carotid arteries: Secondary | ICD-10-CM

## 2019-06-23 ENCOUNTER — Telehealth: Payer: Self-pay | Admitting: Family Medicine

## 2019-06-23 DIAGNOSIS — E11319 Type 2 diabetes mellitus with unspecified diabetic retinopathy without macular edema: Secondary | ICD-10-CM

## 2019-06-23 NOTE — Telephone Encounter (Signed)
-----   Message from Cloyd Stagers, RT sent at 06/17/2019  2:32 PM EST ----- Regarding: Lab Orders for Tuesday 1.12.2021 Please place lab orders for Tuesday 1.12.2021, office visit for physical on Tuesday 1.19.2021 Thank you, Dyke Maes RT(R)

## 2019-06-24 ENCOUNTER — Ambulatory Visit: Payer: PPO

## 2019-06-24 ENCOUNTER — Other Ambulatory Visit: Payer: PPO

## 2019-06-24 ENCOUNTER — Other Ambulatory Visit: Payer: Self-pay

## 2019-06-24 ENCOUNTER — Other Ambulatory Visit (INDEPENDENT_AMBULATORY_CARE_PROVIDER_SITE_OTHER): Payer: PPO

## 2019-06-24 DIAGNOSIS — E11319 Type 2 diabetes mellitus with unspecified diabetic retinopathy without macular edema: Secondary | ICD-10-CM

## 2019-06-24 LAB — LDL CHOLESTEROL, DIRECT: Direct LDL: 62 mg/dL

## 2019-06-24 LAB — LIPID PANEL
Cholesterol: 117 mg/dL (ref 0–200)
HDL: 30.5 mg/dL — ABNORMAL LOW (ref >39.00)
NonHDL: 86.2
Total CHOL/HDL Ratio: 4
Triglycerides: 257 mg/dL — ABNORMAL HIGH (ref 0.0–149.0)
VLDL: 51.4 mg/dL — ABNORMAL HIGH (ref 0.0–40.0)

## 2019-06-24 LAB — COMPREHENSIVE METABOLIC PANEL
ALT: 13 U/L (ref 0–53)
AST: 17 U/L (ref 0–37)
Albumin: 4 g/dL (ref 3.5–5.2)
Alkaline Phosphatase: 59 U/L (ref 39–117)
BUN: 20 mg/dL (ref 6–23)
CO2: 30 mEq/L (ref 19–32)
Calcium: 9.4 mg/dL (ref 8.4–10.5)
Chloride: 100 mEq/L (ref 96–112)
Creatinine, Ser: 1.22 mg/dL (ref 0.40–1.50)
GFR: 57.46 mL/min — ABNORMAL LOW (ref >60.00)
Glucose, Bld: 151 mg/dL — ABNORMAL HIGH (ref 70–99)
Potassium: 4.3 mEq/L (ref 3.5–5.1)
Sodium: 136 mEq/L (ref 135–145)
Total Bilirubin: 0.7 mg/dL (ref 0.2–1.2)
Total Protein: 7 g/dL (ref 6.0–8.3)

## 2019-06-24 LAB — HEMOGLOBIN A1C: Hgb A1c MFr Bld: 6.7 % — ABNORMAL HIGH (ref 4.6–6.5)

## 2019-06-24 NOTE — Progress Notes (Signed)
No critical labs need to be addressed urgently. We will discuss labs in detail at upcoming office visit.   

## 2019-06-30 LAB — HM DIABETES FOOT EXAM

## 2019-07-01 ENCOUNTER — Other Ambulatory Visit: Payer: Self-pay

## 2019-07-01 ENCOUNTER — Encounter: Payer: Self-pay | Admitting: Family Medicine

## 2019-07-01 ENCOUNTER — Ambulatory Visit (INDEPENDENT_AMBULATORY_CARE_PROVIDER_SITE_OTHER): Payer: PPO | Admitting: Family Medicine

## 2019-07-01 VITALS — BP 140/74 | HR 61 | Temp 98.1°F | Ht 69.5 in | Wt 220.2 lb

## 2019-07-01 DIAGNOSIS — Z0000 Encounter for general adult medical examination without abnormal findings: Secondary | ICD-10-CM | POA: Diagnosis not present

## 2019-07-01 DIAGNOSIS — I10 Essential (primary) hypertension: Secondary | ICD-10-CM | POA: Diagnosis not present

## 2019-07-01 DIAGNOSIS — E113393 Type 2 diabetes mellitus with moderate nonproliferative diabetic retinopathy without macular edema, bilateral: Secondary | ICD-10-CM

## 2019-07-01 DIAGNOSIS — E11319 Type 2 diabetes mellitus with unspecified diabetic retinopathy without macular edema: Secondary | ICD-10-CM

## 2019-07-01 DIAGNOSIS — E1142 Type 2 diabetes mellitus with diabetic polyneuropathy: Secondary | ICD-10-CM | POA: Diagnosis not present

## 2019-07-01 DIAGNOSIS — I6522 Occlusion and stenosis of left carotid artery: Secondary | ICD-10-CM | POA: Insufficient documentation

## 2019-07-01 DIAGNOSIS — E7800 Pure hypercholesterolemia, unspecified: Secondary | ICD-10-CM | POA: Diagnosis not present

## 2019-07-01 NOTE — Assessment & Plan Note (Signed)
Followed by Eye MD.

## 2019-07-01 NOTE — Assessment & Plan Note (Signed)
Borderline control.. follow at home.

## 2019-07-01 NOTE — Assessment & Plan Note (Signed)
Stable.. minimally symptomatic.

## 2019-07-01 NOTE — Progress Notes (Signed)
Chief Complaint  Patient presents with  . Medicare Wellness    History of Present Illness: HPI   The patient presents for annual medicare wellness, complete physical and review of chronic health problems. He/She also has the following acute concerns today:  I have personally reviewed the Medicare Annual Wellness questionnaire and have noted 1. The patient's medical and social history 2. Their use of alcohol, tobacco or illicit drugs 3. Their current medications and supplements 4. The patient's functional ability including ADL's, fall risks, home safety risks and hearing or visual             impairment. 5. Diet and physical activities 6. Evidence for depression or mood disorders 7.         Updated provider list Cognitive evaluation was performed and recorded on pt medicare questionnaire form. The patients weight, height, BMI and visual acuity have been recorded in the chart  I have made referrals, counseling and provided education to the patient based review of the above and I have provided the pt with a written personalized care plan for preventive services.   Documentation of this information was scanned into the electronic record under the media tab.   Advance directives and end of life planning reviewed in detail with patient and documented in EMR. Patient given handout on advance care directives if needed. HCPOA and living will updated if needed.   Hearing Screening   125Hz  250Hz  500Hz  1000Hz  2000Hz  3000Hz  4000Hz  6000Hz  8000Hz   Right ear:           Left ear:           Comments: Wears Bilateral Hearing Aides  Vision Screening Comments: Wears Glasses-Eye exam 12/09/28/2020 with Dr. Rodman Key at [facility] and [facility]  07/01/2019 05/07/2019 06/19/2018 06/14/2017 12/15/2016  Falls in the past year? 0 0 0 No Yes  Comment - Emmi Telephone Survey: data to providers prior to load - - -  Number falls in past yr: - - - - 1  Injury with Fall? - - - - Yes    PHQ9  SCORE ONLY 07/01/2019 06/19/2018 06/14/2017  Score 0 0 0    Diabetes:   Well controlled with  metformin Lab Results  Component Value Date   HGBA1C 6.7 (H) 06/24/2019  Using medications without difficulties: Hypoglycemic episodes:none Hyperglycemic episodes:none Feet problems: no ulcers, neuropathy minimally symptomatic. Blood Sugars averaging: fbs 130-143 eye exam within last year: 11/2018.Marland Kitchen on second review today.. retinopathy noted... handwritting hard to read.. pt reported retinopathy discussed as well.  Elevated Cholesterol:  LDL at goal < 70 on crestor, trig still elevated despite fenofibrate Lab Results  Component Value Date   CHOL 117 06/24/2019   HDL 30.50 (L) 06/24/2019   LDLCALC 54 11/28/2013   LDLDIRECT 62.0 06/24/2019   TRIG 257.0 (H) 06/24/2019   CHOLHDL 4 06/24/2019  Using medications without problems: Muscle aches:  Diet compliance: moderate Exercise: walking Other complaints:  Hypertension:   Borderline control today in office on  HCTZ BP Readings from Last 3 Encounters:  07/01/19 140/74  12/31/18 134/70  06/28/18 130/70  Using medication without problems or lightheadedness:  none Chest pain with exertion:none Edema:none Short of breath none: Average home BPs: Other issues:  Carotid stenosis noted: 06/16/2019.Marland Kitchen total occlusion of left ICA , mild to moderate stenosis otherwise.. referred to vascular.   This visit occurred during the SARS-CoV-2 public health emergency.  Safety protocols were in place, including screening questions prior to the visit,  additional usage of staff PPE, and extensive cleaning of exam room while observing appropriate contact time as indicated for disinfecting solutions.   COVID 19 screen:  No recent travel or known exposure to COVID19 The patient denies respiratory symptoms of COVID 19 at this time. The importance of social distancing was discussed today.     Review of Systems  Constitutional: Negative for chills and fever.   HENT: Negative for congestion and ear pain.   Eyes: Negative for pain and redness.  Respiratory: Negative for cough and shortness of breath.   Cardiovascular: Negative for chest pain, palpitations and leg swelling.  Gastrointestinal: Negative for abdominal pain, blood in stool, constipation, diarrhea, nausea and vomiting.  Genitourinary: Negative for dysuria.  Musculoskeletal: Negative for falls and myalgias.  Skin: Negative for rash.  Neurological: Negative for dizziness.  Psychiatric/Behavioral: Negative for depression. The patient is not nervous/anxious.       Past Medical History:  Diagnosis Date  . Anemia   . Basal cell carcinoma of face   . Cataract   . Diabetes mellitus without complication ([place])   . Glaucoma   . Hypercalcemia   . Hyperparathyroidism, unspecified ([place])   . Impotence of organic origin   . Other testicular hypofunction   . Pure hyperglyceridemia   . Special screening for malignant neoplasm of prostate   . Special screening for malignant neoplasms, colon   . Unspecified essential hypertension     reports that he quit smoking about 41 years ago. He quit after 25.00 years of use. He has never used smokeless tobacco. He reports current alcohol use. He reports that he does not use drugs.   Current Outpatient Medications:  .  aspirin EC 81 MG tablet, Take 81 mg by mouth daily., Disp: , Rfl:  .  Cholecalciferol (VITAMIN D) 2000 UNITS tablet, Take 2,000 Units by mouth daily.  , Disp: , Rfl:  .  dorzolamide-timolol (COSOPT) 22.3-6.8 MG/ML ophthalmic solution, , Disp: , Rfl:  .  fenofibrate 160 MG tablet, TAKE 1 TABLET BY MOUTH EVERY DAY, Disp: 90 tablet, Rfl: 2 .  hydrochlorothiazide (HYDRODIURIL) 25 MG tablet, TAKE ONE TABLET BY MOUTH EVERY DAY, Disp: 90 tablet, Rfl: 3 .  latanoprost (XALATAN) 0.005 % ophthalmic solution, , Disp: , Rfl:  .  MEGARED OMEGA-3 KRILL OIL PO, Take 1 capsule by mouth daily., Disp: , Rfl:  .  metFORMIN (GLUCOPHAGE) 500 MG tablet, TAKE  ONE TABLET BY MOUTH TWICE DAILY WITH A MEAL, Disp: 180 tablet, Rfl: 1 .  rosuvastatin (CRESTOR) 20 MG tablet, TAKE 1 TABLET BY MOUTH EVERY DAY, Disp: 90 tablet, Rfl: 3   Observations/Objective: Blood pressure 140/74, pulse 61, temperature 98.1 F (36.7 C), temperature source Temporal, height 5' 9.5" (1.765 m), weight 220 lb 4 oz (99.9 kg), SpO2 94 %.  Physical Exam Constitutional:      General: He is not in acute distress.    Appearance: Normal appearance. He is well-developed. He is obese. He is not ill-appearing or toxic-appearing.  HENT:     Head: Normocephalic and atraumatic.     Right Ear: Hearing, tympanic membrane, ear canal and external ear normal.     Left Ear: Hearing, tympanic membrane, ear canal and external ear normal.     Nose: Nose normal.     Mouth/Throat:     Pharynx: Uvula midline.  Eyes:     General: Lids are normal. Lids are everted, no foreign bodies appreciated.     Conjunctiva/sclera: Conjunctivae normal.     Pupils:  Pupils are equal, round, and reactive to light.  Neck:     Thyroid: No thyroid mass or thyromegaly.     Vascular: No carotid bruit.     Trachea: Trachea and phonation normal.  Cardiovascular:     Rate and Rhythm: Normal rate and regular rhythm.     Pulses: Normal pulses.     Heart sounds: S1 normal and S2 normal. No murmur. No gallop.      Comments:  Slight bruit heard on left carotid Pulmonary:     Breath sounds: Normal breath sounds. No wheezing, rhonchi or rales.  Abdominal:     General: Bowel sounds are normal.     Palpations: Abdomen is soft.     Tenderness: There is no abdominal tenderness. There is no guarding or rebound.     Hernia: No hernia is present.  Musculoskeletal:     Cervical back: Normal range of motion and neck supple.  Lymphadenopathy:     Cervical: No cervical adenopathy.  Skin:    General: Skin is warm and dry.     Findings: No rash.  Neurological:     Mental Status: He is alert.     Cranial Nerves: No cranial  nerve deficit.     Sensory: No sensory deficit.     Gait: Gait normal.     Deep Tendon Reflexes: Reflexes are normal and symmetric.  Psychiatric:        Speech: Speech normal.        Behavior: Behavior normal.        Judgment: Judgment normal.      Diabetic foot exam: Normal inspection No skin breakdown No calluses  Normal DP pulses decreased sensation to light touch and monofilament Nails normal  Assessment and Plan The patient's preventative maintenance and recommended screening tests for an annual wellness exam were reviewed in full today. Brought up to date unless services declined.  Counselled on the importance of diet, exercise, and its role in overall health and mortality. The patient's FH and SH was reviewed, including their home life, tobacco status, and drug and alcohol status.   Vaccines: PNA and Td uptodate, refuses shingles vaccine...says he has never had chicken pox  Former smoker.Quit 34 years ago.  Colon: 06/2018 Dr. Fuller Plan, tubular adenoma, repeat 5 years if healthy. Prostate: not indicated after age 60.    Diabetes mellitus with ophthalmic complication (HCC) Well controlled. Continue current medication.   Essential hypertension, benign Borderline control.. follow at home.  High cholesterol Well controlled. Continue current medication.   Diabetic retinopathy  Followed by Eye MD.  Diabetic neuropathy ([place]) Stable.. minimally symptomatic.  ICAO (internal carotid artery occlusion), left Cholesterol controlled on statin. On  Baby aspirin, Pt asymptomatic.  Referral to vascular in process.     Eliezer Lofts, MD

## 2019-07-01 NOTE — Patient Instructions (Addendum)
Try to decrease salt in diet.  Check blood pressure at home.. Goal < 140/90.  Please let us know if ypou do not hear from the vascular doctor to set up an appointment by the end of the week.

## 2019-07-01 NOTE — Assessment & Plan Note (Signed)
Well controlled. Continue current medication.  

## 2019-07-01 NOTE — Assessment & Plan Note (Signed)
Cholesterol controlled on statin. On  Baby aspirin, Pt asymptomatic.  Referral to vascular in process.

## 2019-07-02 ENCOUNTER — Encounter: Payer: Self-pay | Admitting: Vascular Surgery

## 2019-07-02 ENCOUNTER — Other Ambulatory Visit: Payer: Self-pay

## 2019-07-02 ENCOUNTER — Ambulatory Visit (INDEPENDENT_AMBULATORY_CARE_PROVIDER_SITE_OTHER): Payer: PPO | Admitting: Vascular Surgery

## 2019-07-02 VITALS — BP 132/73 | HR 57 | Temp 98.0°F | Resp 18 | Ht 69.5 in | Wt 221.0 lb

## 2019-07-02 DIAGNOSIS — I6522 Occlusion and stenosis of left carotid artery: Secondary | ICD-10-CM

## 2019-07-02 NOTE — Progress Notes (Signed)
Referring Physician: Dr. Diona Browner  Patient name: Miguel Bradley MRN: [id number] DOB: [birth year] Sex: male  REASON FOR CONSULT: Left carotid occlusion  HPI: Miguel Bradley is a 79 y.o. male, recently underwent carotid duplex scan on June 16, 2019.  This showed left internal carotid artery occlusion.  This was a scan that had been subsequently done after a [facility] screening in October 2020 which again showed left internal carotid artery occlusion.  There was no significant right internal carotid artery stenosis.  The patient is on aspirin statin and fenofibrate.  He quit smoking in 1980.  He has had no symptoms of TIA amaurosis or stroke.  Other medical problems include   Past Medical History:  Diagnosis Date  . Anemia   . Basal cell carcinoma of face   . Cataract   . Diabetes mellitus without complication ([place])   . Glaucoma   . Hypercalcemia   . Hyperparathyroidism, unspecified ([place])   . Impotence of organic origin   . Other testicular hypofunction   . Pure hyperglyceridemia   . Special screening for malignant neoplasm of prostate   . Special screening for malignant neoplasms, colon   . Unspecified essential hypertension    Past Surgical History:  Procedure Laterality Date  . COLONOSCOPY  06/13/2018   Dr. Lucio Edward  . PARATHYROIDECTOMY  2012  . skin cancer removal     Dr. Sharlett Iles  . TRIGGER FINGER RELEASE Left 2008    Family History  Problem Relation Age of Onset  . Hypertension Mother   . Dementia Mother   . Diabetes Other        aunt  . Hypertension Brother   . Hypertension Brother   . Hypertension Brother   . Hyperlipidemia Brother   . Hyperlipidemia Brother   . Hyperlipidemia Brother   . Hypertension Sister   . Hyperlipidemia Sister   . Colon cancer Neg Hx   . Esophageal cancer Neg Hx   . Rectal cancer Neg Hx   . Stomach cancer Neg Hx     SOCIAL HISTORY: Social History   Socioeconomic History  . Marital status: Married    Spouse name: Not on  file  . Number of children: 1  . Years of education: Not on file  . Highest education level: Not on file  Occupational History  . Occupation: Water quality scientist at [employer]  . Smoking status: Former Smoker    Years: 25.00    Quit date: 06/12/1978    Years since quitting: 41.0  . Smokeless tobacco: Never Used  Substance and Sexual Activity  . Alcohol use: Yes    Comment: 1 beer every few weeks  . Drug use: No  . Sexual activity: Not Currently  Other Topics Concern  . Not on file  Social History Narrative   No exercise, plays golf.   Poor diet.   Social Determinants of Health   Financial Resource Strain:   . Difficulty of Paying Living Expenses: Not on file  Food Insecurity:   . Worried About Charity fundraiser in the Last Year: Not on file  . Ran Out of Food in the Last Year: Not on file  Transportation Needs:   . Lack of Transportation (Medical): Not on file  . Lack of Transportation (Non-Medical): Not on file  Physical Activity:   . Days of Exercise per Week: Not on file  . Minutes of Exercise per Session: Not on file  Stress:   . Feeling of  Stress : Not on file  Social Connections:   . Frequency of Communication with Friends and Family: Not on file  . Frequency of Social Gatherings with Friends and Family: Not on file  . Attends Religious Services: Not on file  . Active Member of Clubs or Organizations: Not on file  . Attends Archivist Meetings: Not on file  . Marital Status: Not on file  Intimate Partner Violence:   . Fear of Current or Ex-Partner: Not on file  . Emotionally Abused: Not on file  . Physically Abused: Not on file  . Sexually Abused: Not on file    No Known Allergies  Current Outpatient Medications  Medication Sig Dispense Refill  . aspirin EC 81 MG tablet Take 81 mg by mouth daily.    . Cholecalciferol (VITAMIN D) 2000 UNITS tablet Take 2,000 Units by mouth daily.      . dorzolamide-timolol (COSOPT) 22.3-6.8 MG/ML ophthalmic  solution     . fenofibrate 160 MG tablet TAKE 1 TABLET BY MOUTH EVERY DAY 90 tablet 2  . hydrochlorothiazide (HYDRODIURIL) 25 MG tablet TAKE ONE TABLET BY MOUTH EVERY DAY 90 tablet 3  . latanoprost (XALATAN) 0.005 % ophthalmic solution     . MEGARED OMEGA-3 KRILL OIL PO Take 1 capsule by mouth daily.    . metFORMIN (GLUCOPHAGE) 500 MG tablet TAKE ONE TABLET BY MOUTH TWICE DAILY WITH A MEAL 180 tablet 1  . rosuvastatin (CRESTOR) 20 MG tablet TAKE 1 TABLET BY MOUTH EVERY DAY 90 tablet 3   No current facility-administered medications for this visit.    ROS:   General:  No weight loss, Fever, chills  HEENT: No recent headaches, no nasal bleeding, no visual changes, no sore throat  Neurologic: No dizziness, blackouts, seizures. No recent symptoms of stroke or mini- stroke. No recent episodes of slurred speech, or temporary blindness.  Cardiac: No recent episodes of chest pain/pressure, no shortness of breath at rest.  No shortness of breath with exertion.  Denies history of atrial fibrillation or irregular heartbeat  Vascular: No history of rest pain in feet.  No history of claudication.  No history of non-healing ulcer, No history of DVT   Pulmonary: No home oxygen, no productive cough, no hemoptysis,  No asthma or wheezing  Musculoskeletal:  [ ]  Arthritis, [ ]  Low back pain,  [ ]  Joint pain  Hematologic:No history of hypercoagulable state.  No history of easy bleeding.  No history of anemia  Gastrointestinal: No hematochezia or melena,  No gastroesophageal reflux, no trouble swallowing  Urinary: [ ]  chronic Kidney disease, [ ]  on HD - [ ]  MWF or [ ]  TTHS, [ ]  Burning with urination, [ ]  Frequent urination, [ ]  Difficulty urinating;   Skin: No rashes  Psychological: No history of anxiety,  No history of depression   Physical Examination  Vitals:   07/02/19 0830  BP: (!) 141/75  Pulse: (!) 57  Resp: 18  Temp: 98 F (36.7 C)  TempSrc: Temporal  SpO2: 96%  Weight: 221 lb  (100.2 kg)  Height: 5' 9.5" (1.765 m)    Body mass index is 32.17 kg/m.  General:  Alert and oriented, no acute distress HEENT: Normal Neck: No bruit or JVD Pulmonary: Clear to auscultation bilaterally Cardiac: Regular Rate and Rhythm without murmur Abdomen: Soft, non-tender, non-distended, no mass, no scars Skin: No rash Extremity Pulses:  2+ radial, brachial, femoral, dorsalis pedis, posterior tibial pulses bilaterally Musculoskeletal: No deformity or edema  Neurologic: Upper  and lower extremity motor 5/5 and symmetric  DATA:  I reviewed the patient's recent duplex scan dated June 16, 2019.  This shows a chronic left internal carotid artery occlusion bilateral vertebral arteries without significant stenosis no significant right internal carotid artery stenosis  ASSESSMENT: Chronic left internal carotid artery occlusion asymptomatic no benefit for carotid endarterectomy when the artery is occluded.  At this point risk factor modification to prevent progression of carotid disease on the contralateral side.   PLAN: Patient will continue his aspirin and statin and fenofibrate.  He really has no significant stenosis on the right side.  He can follow-up on an as-needed basis.   Ruta Hinds, MD [facility] Office: [phone] Pager: [phone]

## 2019-07-09 ENCOUNTER — Other Ambulatory Visit: Payer: Self-pay | Admitting: Family Medicine

## 2019-07-29 ENCOUNTER — Encounter: Payer: PPO | Admitting: Vascular Surgery

## 2019-07-30 ENCOUNTER — Encounter: Payer: PPO | Admitting: Vascular Surgery

## 2019-08-04 ENCOUNTER — Other Ambulatory Visit: Payer: Self-pay | Admitting: Family Medicine

## 2019-08-05 DIAGNOSIS — H40153 Residual stage of open-angle glaucoma, bilateral: Secondary | ICD-10-CM | POA: Diagnosis not present

## 2019-08-08 ENCOUNTER — Encounter: Payer: PPO | Admitting: Vascular Surgery

## 2019-08-08 ENCOUNTER — Telehealth: Payer: Self-pay

## 2019-08-08 NOTE — Telephone Encounter (Signed)
Spoke to pt. Advised him that I was trying to get with a provider to see if he can come in with the eye issue. All the available appointments filled up prior to that. He said it is a possibility that it could be allergies. He is expecting a call back from his eye doctor. If they do not do anything for him and he thinks he needs to be evaluated, he will call in the morning and be placed on the Saturday Clinic Schedule.

## 2019-08-08 NOTE — Telephone Encounter (Signed)
Pt called triage line stating he has [place]. Said he went to [facility] and "the person" (not a Software engineer) there told him he had [place] and needed to have his doctor call something in for him.   I advised him Dr Diona Browner was full and I would have to get with another provider to see if they would see him. He said he would call his eye doctor.

## 2019-08-30 ENCOUNTER — Other Ambulatory Visit: Payer: Self-pay | Admitting: Family Medicine

## 2019-11-26 DIAGNOSIS — H40153 Residual stage of open-angle glaucoma, bilateral: Secondary | ICD-10-CM | POA: Diagnosis not present

## 2019-12-10 ENCOUNTER — Encounter (INDEPENDENT_AMBULATORY_CARE_PROVIDER_SITE_OTHER): Payer: PPO | Admitting: Ophthalmology

## 2019-12-10 ENCOUNTER — Other Ambulatory Visit: Payer: Self-pay

## 2019-12-10 DIAGNOSIS — I10 Essential (primary) hypertension: Secondary | ICD-10-CM

## 2019-12-10 DIAGNOSIS — H43813 Vitreous degeneration, bilateral: Secondary | ICD-10-CM | POA: Diagnosis not present

## 2019-12-10 DIAGNOSIS — H35372 Puckering of macula, left eye: Secondary | ICD-10-CM

## 2019-12-10 DIAGNOSIS — H35033 Hypertensive retinopathy, bilateral: Secondary | ICD-10-CM | POA: Diagnosis not present

## 2019-12-25 ENCOUNTER — Telehealth: Payer: Self-pay | Admitting: Family Medicine

## 2019-12-25 DIAGNOSIS — E11319 Type 2 diabetes mellitus with unspecified diabetic retinopathy without macular edema: Secondary | ICD-10-CM

## 2019-12-25 NOTE — Telephone Encounter (Signed)
-----   Message from Ellamae Sia sent at 12/09/2019  3:37 PM EDT ----- Regarding: Lab orders for Friday, 7.16.21 Lab orders for a 6 month follow up appt

## 2019-12-26 ENCOUNTER — Other Ambulatory Visit: Payer: Self-pay

## 2019-12-26 ENCOUNTER — Other Ambulatory Visit (INDEPENDENT_AMBULATORY_CARE_PROVIDER_SITE_OTHER): Payer: PPO

## 2019-12-26 DIAGNOSIS — E11319 Type 2 diabetes mellitus with unspecified diabetic retinopathy without macular edema: Secondary | ICD-10-CM | POA: Diagnosis not present

## 2019-12-26 LAB — COMPREHENSIVE METABOLIC PANEL
ALT: 13 U/L (ref 0–53)
AST: 17 U/L (ref 0–37)
Albumin: 4 g/dL (ref 3.5–5.2)
Alkaline Phosphatase: 56 U/L (ref 39–117)
BUN: 20 mg/dL (ref 6–23)
CO2: 24 mEq/L (ref 19–32)
Calcium: 9 mg/dL (ref 8.4–10.5)
Chloride: 102 mEq/L (ref 96–112)
Creatinine, Ser: 1.25 mg/dL (ref 0.40–1.50)
GFR: 55.8 mL/min — ABNORMAL LOW (ref >60.00)
Glucose, Bld: 160 mg/dL — ABNORMAL HIGH (ref 70–99)
Potassium: 3.6 mEq/L (ref 3.5–5.1)
Sodium: 135 mEq/L (ref 135–145)
Total Bilirubin: 0.6 mg/dL (ref 0.2–1.2)
Total Protein: 6.3 g/dL (ref 6.0–8.3)

## 2019-12-26 LAB — LIPID PANEL
Cholesterol: 119 mg/dL (ref 0–200)
HDL: 31.2 mg/dL — ABNORMAL LOW (ref >39.00)
NonHDL: 87.73
Total CHOL/HDL Ratio: 4
Triglycerides: 220 mg/dL — ABNORMAL HIGH (ref 0.0–149.0)
VLDL: 44 mg/dL — ABNORMAL HIGH (ref 0.0–40.0)

## 2019-12-26 LAB — MICROALBUMIN / CREATININE URINE RATIO
Creatinine,U: 146.6 mg/dL
Microalb Creat Ratio: 0.8 mg/g (ref 0.0–30.0)
Microalb, Ur: 1.2 mg/dL (ref 0.0–1.9)

## 2019-12-26 LAB — HEMOGLOBIN A1C: Hgb A1c MFr Bld: 6.9 % — ABNORMAL HIGH (ref 4.6–6.5)

## 2019-12-26 LAB — LDL CHOLESTEROL, DIRECT: Direct LDL: 61 mg/dL

## 2019-12-26 NOTE — Progress Notes (Signed)
No critical labs need to be addressed urgently. We will discuss labs in detail at upcoming office visit.   

## 2019-12-26 NOTE — Addendum Note (Signed)
Addended by: Ellamae Sia on: 12/26/2019 08:11 AM   Modules accepted: Orders

## 2019-12-30 ENCOUNTER — Encounter: Payer: Self-pay | Admitting: Family Medicine

## 2019-12-30 ENCOUNTER — Other Ambulatory Visit: Payer: Self-pay

## 2019-12-30 ENCOUNTER — Ambulatory Visit (INDEPENDENT_AMBULATORY_CARE_PROVIDER_SITE_OTHER): Payer: PPO | Admitting: Family Medicine

## 2019-12-30 VITALS — BP 120/72 | HR 55 | Temp 98.5°F | Ht 69.5 in | Wt 220.5 lb

## 2019-12-30 DIAGNOSIS — I10 Essential (primary) hypertension: Secondary | ICD-10-CM

## 2019-12-30 DIAGNOSIS — E1142 Type 2 diabetes mellitus with diabetic polyneuropathy: Secondary | ICD-10-CM

## 2019-12-30 DIAGNOSIS — E7800 Pure hypercholesterolemia, unspecified: Secondary | ICD-10-CM

## 2019-12-30 DIAGNOSIS — E11319 Type 2 diabetes mellitus with unspecified diabetic retinopathy without macular edema: Secondary | ICD-10-CM

## 2019-12-30 DIAGNOSIS — Z7409 Other reduced mobility: Secondary | ICD-10-CM | POA: Diagnosis not present

## 2019-12-30 DIAGNOSIS — I6522 Occlusion and stenosis of left carotid artery: Secondary | ICD-10-CM

## 2019-12-30 NOTE — Progress Notes (Signed)
Chief Complaint  Patient presents with  . Diabetes    History of Present Illness: HPI  79 year old male with history of DM, HTN, high CHolesterol presents for follow up  Diabetes:At goal on metformin Lab Results  Component Value Date   HGBA1C 6.9 (H) 12/26/2019  Using medications without difficulties: Hypoglycemic episodes: Hyperglycemic episodes: Feet problems:  Numbness in feet, sole of foot Blood Sugars averaging:not checking eye exam within last year: yes  Left internal carotid artery occlusion, asymptomatic.. followed by Dr. Oneida Alar. OV from 07/02/2019 reviewed. Recommended ASA and statin.  no vision change, no dizziness.  Elevated Cholesterol:  LDL at goal on statin, crestor. Trigs remain high despite fenofibrate Lab Results  Component Value Date   CHOL 119 12/26/2019   HDL 31.20 (L) 12/26/2019   LDLCALC 54 11/28/2013   LDLDIRECT 61.0 12/26/2019   TRIG 220.0 (H) 12/26/2019   CHOLHDL 4 12/26/2019  Using medications without problems: Muscle aches:  Diet compliance: moderate Exercise: walking Other complaints:  Hypertension:   At goal on HCTZ. BP Readings from Last 3 Encounters:  12/30/19 120/72  07/02/19 132/73  07/01/19 140/74  Using medication without problems or lightheadedness: none Chest pain with exertion:none Edema:none Short of breath: none Average home BPs: Other issues: less endurance with golfing..noted only on questioning, ongoing for years as he has aged.   This visit occurred during the SARS-CoV-2 public health emergency.  Safety protocols were in place, including screening questions prior to the visit, additional usage of staff PPE, and extensive cleaning of exam room while observing appropriate contact time as indicated for disinfecting solutions.   COVID 19 screen:  No recent travel or known exposure to COVID19 The patient denies respiratory symptoms of COVID 19 at this time. The importance of social distancing was discussed today.      Review of Systems  Constitutional: Negative for chills and fever.  HENT: Negative for congestion and ear pain.   Eyes: Negative for pain and redness.  Respiratory: Negative for cough and shortness of breath.   Cardiovascular: Negative for chest pain, palpitations and leg swelling.  Gastrointestinal: Negative for abdominal pain, blood in stool, constipation, diarrhea, nausea and vomiting.  Genitourinary: Negative for dysuria.  Musculoskeletal: Negative for falls and myalgias.  Skin: Negative for rash.  Neurological: Negative for dizziness.  Psychiatric/Behavioral: Negative for depression. The patient is not nervous/anxious.       Past Medical History:  Diagnosis Date  . Anemia   . Basal cell carcinoma of face   . Cataract   . Diabetes mellitus without complication ([place])   . Glaucoma   . Hypercalcemia   . Hyperparathyroidism, unspecified ([place])   . Impotence of organic origin   . Other testicular hypofunction   . Pure hyperglyceridemia   . Special screening for malignant neoplasm of prostate   . Special screening for malignant neoplasms, colon   . Unspecified essential hypertension     reports that he quit smoking about 41 years ago. He quit after 25.00 years of use. He has never used smokeless tobacco. He reports current alcohol use. He reports that he does not use drugs.   Current Outpatient Medications:  .  aspirin EC 81 MG tablet, Take 81 mg by mouth daily., Disp: , Rfl:  .  Cholecalciferol (VITAMIN D) 2000 UNITS tablet, Take 2,000 Units by mouth daily.  , Disp: , Rfl:  .  dorzolamide-timolol (COSOPT) 22.3-6.8 MG/ML ophthalmic solution, Place 1 drop into both eyes 2 (two) times daily. , Disp: ,  Rfl:  .  fenofibrate 160 MG tablet, TAKE 1 TABLET BY MOUTH EVERY DAY, Disp: 90 tablet, Rfl: 3 .  hydrochlorothiazide (HYDRODIURIL) 25 MG tablet, TAKE 1 TABLET BY MOUTH EVERY DAY, Disp: 90 tablet, Rfl: 3 .  latanoprost (XALATAN) 0.005 % ophthalmic solution, Place 1 drop into both  eyes at bedtime. , Disp: , Rfl:  .  MEGARED OMEGA-3 KRILL OIL PO, Take 1 capsule by mouth daily., Disp: , Rfl:  .  metFORMIN (GLUCOPHAGE) 500 MG tablet, TAKE ONE TABLET BY MOUTH TWICE DAILY WITH A MEAL, Disp: 180 tablet, Rfl: 1 .  rosuvastatin (CRESTOR) 20 MG tablet, TAKE 1 TABLET BY MOUTH EVERY DAY, Disp: 90 tablet, Rfl: 3   Observations/Objective: Blood pressure 120/72, pulse (!) 55, temperature 98.5 F (36.9 C), temperature source Temporal, height 5' 9.5" (1.765 m), weight 220 lb 8 oz (100 kg), SpO2 95 %.  Physical Exam Constitutional:      Appearance: He is well-developed. He is obese.  HENT:     Head: Normocephalic.     Right Ear: Hearing normal.     Left Ear: Hearing normal.     Nose: Nose normal.  Neck:     Thyroid: No thyroid mass or thyromegaly.     Vascular: No carotid bruit.     Trachea: Trachea normal.  Cardiovascular:     Rate and Rhythm: Normal rate and regular rhythm.     Pulses: Normal pulses.     Heart sounds: Heart sounds not distant. No murmur heard.  No friction rub. No gallop.      Comments: No peripheral edema Pulmonary:     Effort: Pulmonary effort is normal. No respiratory distress.     Breath sounds: Normal breath sounds.  Skin:    General: Skin is warm and dry.     Findings: No rash.  Psychiatric:        Speech: Speech normal.        Behavior: Behavior normal.        Thought Content: Thought content normal.     Diabetic foot exam: Normal inspection No skin breakdown No calluses  Normal DP pulses Normal sensation to light touch and monofilament Nails normal  Assessment and Plan Diabetic retinopathy associated with type 2 diabetes mellitus ([place]) Followed  By opthamology.  Diabetic neuropathy (HCC)  Stable.  Decreased mobility and endurance Pt minimally symptomatic with decreased endurance but high risk for CAD given new dx of cardiac equivalent: carotid disease,  as well as DM, high cholesterol and HTN. Refer to cardiology for possible  stress testing evaluation.  ICAO (internal carotid artery occlusion), left No surgical treatment needed per vascular.  Continue ASA and statin.  Diabetes mellitus with ophthalmic complication (HCC) Well controlled. Continue current medication.   Essential hypertension, benign Well controlled. Continue current medication.   High cholesterol LDL at goal on crestor.       Eliezer Lofts, MD

## 2019-12-30 NOTE — Assessment & Plan Note (Signed)
LDL at goal on crestor. 

## 2019-12-30 NOTE — Assessment & Plan Note (Signed)
Well controlled. Continue current medication.  

## 2019-12-30 NOTE — Assessment & Plan Note (Signed)
Pt minimally symptomatic with decreased endurance but high risk for CAD given new dx of cardiac equivalent: carotid disease,  as well as DM, high cholesterol and HTN. Refer to cardiology for possible stress testing evaluation.

## 2019-12-30 NOTE — Patient Instructions (Signed)
Get back to low carb diet and increase exercise.

## 2019-12-30 NOTE — Assessment & Plan Note (Signed)
Stable

## 2019-12-30 NOTE — Assessment & Plan Note (Signed)
Followed  By opthamology.

## 2019-12-30 NOTE — Assessment & Plan Note (Signed)
No surgical treatment needed per vascular.  Continue ASA and statin.

## 2020-01-13 ENCOUNTER — Other Ambulatory Visit: Payer: Self-pay | Admitting: Family Medicine

## 2020-01-20 ENCOUNTER — Ambulatory Visit: Payer: PPO | Admitting: Physician Assistant

## 2020-01-20 ENCOUNTER — Other Ambulatory Visit: Payer: Self-pay

## 2020-01-20 ENCOUNTER — Encounter: Payer: Self-pay | Admitting: Physician Assistant

## 2020-01-20 VITALS — BP 126/70 | HR 55 | Ht 70.0 in | Wt 216.5 lb

## 2020-01-20 DIAGNOSIS — E11319 Type 2 diabetes mellitus with unspecified diabetic retinopathy without macular edema: Secondary | ICD-10-CM | POA: Diagnosis not present

## 2020-01-20 DIAGNOSIS — E785 Hyperlipidemia, unspecified: Secondary | ICD-10-CM

## 2020-01-20 DIAGNOSIS — E1142 Type 2 diabetes mellitus with diabetic polyneuropathy: Secondary | ICD-10-CM | POA: Diagnosis not present

## 2020-01-20 DIAGNOSIS — Z7409 Other reduced mobility: Secondary | ICD-10-CM

## 2020-01-20 DIAGNOSIS — I6522 Occlusion and stenosis of left carotid artery: Secondary | ICD-10-CM

## 2020-01-20 DIAGNOSIS — R011 Cardiac murmur, unspecified: Secondary | ICD-10-CM

## 2020-01-20 DIAGNOSIS — I10 Essential (primary) hypertension: Secondary | ICD-10-CM | POA: Diagnosis not present

## 2020-01-20 NOTE — Progress Notes (Signed)
Office Visit    Patient Name: Miguel Bradley Date of Encounter: 01/20/2020  Primary Care Provider:  Jinny Sanders, MD Primary Cardiologist: Iverson Alamin  Chief Complaint    Chief Complaint  Patient presents with  . other    Htn/Decreased mobility/Hyperlipidemia no complaints today. Meds reviewed verbally with pt.    79 year old patient with history of hypertension, hyperlipidemia, carotid artery dz, HLD with goal LDL <70, DM2 with ophthalmic and neuropathy complication, and here to establish care with [facility] cardiology and per referral from his PCP due to decreased mobility and endurance.  Past Medical History    Past Medical History:  Diagnosis Date  . Anemia   . Basal cell carcinoma of face   . Cataract   . Diabetes mellitus without complication ([place])   . Glaucoma   . Hypercalcemia   . Hyperparathyroidism, unspecified ([place])   . Impotence of organic origin   . Other testicular hypofunction   . Pure hyperglyceridemia   . Special screening for malignant neoplasm of prostate   . Special screening for malignant neoplasms, colon   . Unspecified essential hypertension    Past Surgical History:  Procedure Laterality Date  . COLONOSCOPY  06/13/2018   Dr. Lucio Edward  . PARATHYROIDECTOMY  2012  . skin cancer removal     Dr. Sharlett Iles  . TRIGGER FINGER RELEASE Left 2008    Allergies  No Known Allergies  History of Present Illness    Miguel Bradley is a 79 y.o. male with PMH as above. Per review of his PCP progress notes, he is suspected to be at high risk for CAD given new diagnosis of carotid disease with comorbid DM2, hyperlipidemia, and hypertension.  He was thus referred to cardiology for possible stress testing evaluation due to decreased mobility and endurance.  He has no previously known history of heart dz or arrhythmia. He denies a family history of sudden cardiac death at an early age.   He reports he quit smoking in 1980, at which time he smoked 3/4 a pack  of filtered cigarettes daily. He drinks 1 beer once a month on average. He has a history of diabetes with retinopathy (followed by ophthalmology) and bilateral neuropathy.   He was recently evaluated by Dr. Oneida Alar for chronic L internal carotid artery occlusion and noted to be asymptomatic with no benefit for carotid endarterectomy. Recommendation was for risk factor modification and medical therapy to prevent progression of the carotid dz on the contralateral side. He was continued on ASA and fenofibrate.    He presents today and states he has been doing well from a cardiac perspective. He denies decreased mobility and endurance today as reported in his PCP progress notes. He denies chest pain, palpitations, dyspnea, pnd, orthopnea, n, v, dizziness, syncope, edema, weight gain, or early satiety.  He does not have a regular workout routine but does golf at least one game a week (18 holes) and without any symptoms concerning for ischemia. He has been using a salt substitute to reduce daily intake of salt. He does admit to needing to cut back on drinking diet soda (Diet Pepsi and Dr. Malachi Bonds), as he currently drinks 1-2 per day. He reports drinking at least 2 of the 12 oz bottles of water daily, though he is trying to increase water intake by drinking the sugar free flavored water as of recently. He denies any s/sx concerning for worsening carotid dz, including amaurosis fugax.  In terms of unsteady gait, he  denies any dizziness/presyncope or syncope. He reports he has always been clumsy and bumps into things a lot, which is not new for him. He notes that he occasionally has numbness in his foot but denies any s/sx consistent with PAD or claudication. He does not check his blood pressure at home. He is bradycardic today with QRS now progressed to LBBB but denies any symptoms as reported above and including dizziness.   Home Medications    Prior to Admission medications   Medication Sig Start Date End Date  Taking? Authorizing Provider  aspirin EC 81 MG tablet Take 81 mg by mouth daily.   Yes [provider]  Cholecalciferol (VITAMIN D) 2000 UNITS tablet Take 2,000 Units by mouth daily.     Yes [provider]  dorzolamide-timolol (COSOPT) 22.3-6.8 MG/ML ophthalmic solution Place 1 drop into both eyes 2 (two) times daily.  08/18/15  Yes [provider]  fenofibrate 160 MG tablet TAKE 1 TABLET BY MOUTH EVERY DAY 09/01/19  Yes Bedsole, Amy E, MD  hydrochlorothiazide (HYDRODIURIL) 25 MG tablet TAKE 1 TABLET BY MOUTH EVERY DAY 08/04/19  Yes Bedsole, Amy E, MD  latanoprost (XALATAN) 0.005 % ophthalmic solution Place 1 drop into both eyes at bedtime.  11/23/14  Yes [provider]  MEGARED OMEGA-3 KRILL OIL PO Take 1 capsule by mouth daily.   Yes [provider]  metFORMIN (GLUCOPHAGE) 500 MG tablet TAKE ONE TABLET BY MOUTH TWICE DAILY WITH A MEAL 01/13/20  Yes Bedsole, Amy E, MD  rosuvastatin (CRESTOR) 20 MG tablet TAKE 1 TABLET BY MOUTH EVERY DAY 02/18/19  Yes Bedsole, Amy E, MD    Review of Systems    He denies chest pain, palpitations, dyspnea, pnd, orthopnea, n, v, dizziness, syncope, edema, weight gain, or early satiety.   All other systems reviewed and are otherwise negative except as noted above.  Physical Exam    VS:  BP 126/70 (BP Location: Left Arm, Patient Position: Sitting, Cuff Size: Normal)   Pulse (!) 55   Ht 5\' 10"  (1.778 m)   Wt 216 lb 8 oz (98.2 kg)   SpO2 98%   BMI 31.06 kg/m  , BMI Body mass index is 31.06 kg/m. GEN: Well nourished, well developed, in no acute distress. HEENT: normal. Neck: Supple, no JVD, carotid bruits, or masses. Cardiac: bradycardic but regular, 2/6 systolic murmur best appreciated RUSB. No rubs, or gallops. No clubbing, cyanosis, edema.  Radials/DP/PT 2+ and equal bilaterally.  Respiratory:  Respirations regular and unlabored, clear to auscultation bilaterally. GI: Soft, nontender, nondistended, BS + x 4. MS: no  deformity or atrophy. Skin: warm and dry, no rash. Healing small sized scrape on his right tibia / RLE.  Neuro:  Strength and sensation are intact. Psych: Normal affect.  Accessory Clinical Findings    ECG personally reviewed by me today - SB, 55bpm, QRS widening now LBBB, prolonged PRi- no acute changes.  VITALS Reviewed today   Temp Readings from Last 3 Encounters:  12/30/19 98.5 F (36.9 C) (Temporal)  07/02/19 98 F (36.7 C) (Temporal)  07/01/19 98.1 F (36.7 C) (Temporal)   BP Readings from Last 3 Encounters:  01/20/20 126/70  12/30/19 120/72  07/02/19 132/73   Pulse Readings from Last 3 Encounters:  01/20/20 (!) 55  12/30/19 (!) 55  07/02/19 (!) 57    Wt Readings from Last 3 Encounters:  01/20/20 216 lb 8 oz (98.2 kg)  12/30/19 220 lb 8 oz (100 kg)  07/02/19 221 lb (100.2  kg)     LABS  reviewed today   Lab Results  Component Value Date   WBC 8.6 11/16/2009   HGB 16.5 11/16/2009   HCT 47.9 11/16/2009   MCV 95.0 11/16/2009   PLT 308.0 11/16/2009   Lab Results  Component Value Date   CREATININE 1.25 12/26/2019   BUN 20 12/26/2019   NA 135 12/26/2019   K 3.6 12/26/2019   CL 102 12/26/2019   CO2 24 12/26/2019   Lab Results  Component Value Date   ALT 13 12/26/2019   AST 17 12/26/2019   ALKPHOS 56 12/26/2019   BILITOT 0.6 12/26/2019   Lab Results  Component Value Date   CHOL 119 12/26/2019   HDL 31.20 (L) 12/26/2019   LDLCALC 54 11/28/2013   LDLDIRECT 61.0 12/26/2019   TRIG 220.0 (H) 12/26/2019   CHOLHDL 4 12/26/2019    Lab Results  Component Value Date   HGBA1C 6.9 (H) 12/26/2019   Lab Results  Component Value Date   TSH 0.85 11/16/2009     STUDIES/PROCEDURES reviewed today   Carotid US Right Carotid: Velocities in the right ICA are consistent with a 1-39%  stenosis. Non-hemodynamically significant plaque <50% noted in the  CCA.  Left Carotid: Evidence consistent with a total occlusion of the left ICA.  Non-hemodynamically  significant plaque <50% noted in the CCA.  Vertebrals: Bilateral vertebral arteries demonstrate antegrade flow.  Subclavians: Normal flow hemodynamics were seen in bilateral subclavian        arteries.   Assessment & Plan    New systolic murmur --New systolic murmur heard on exam. Pt asymptomatic and denies s/sx of valvular dz. Denies h/o murmur in the past. No previous echo. Will obtain baseline echo to evaluate.   New LBBB --Pt asymptomatic. Previous EKG / most recent EKG is from 2009 and showed widening QRS. Will obtain baseline echo as above. Discussed with pt that we will need further ischemic workup if 1) future sx concerning for ischemia or 2) echo shows reduced EF or acute structural abnormality. Will defer further workup with stress testing or cardiac CT for now and given asx.  Reduced mobility and endurance --Pt denies reduced mobility and endurance. He reports a history of "clumsiness," which has been ongoing for years. He also reports paresthesias but denies s/sx concerning for PAD. As above, and given new systolic murmur, will obtain an echo. He does have risk factors for cardiac dz, including a past history of smoking; therefore, we reviewed sx of ischemia in detail. Discussed need for further ischemic workup if echo shows reduced EF or acute structural changes or valvular dz. Will defer further workup with cardiac CT or stress testing for now and given asx.  Reviewed recommendations for lifestyle modifications and ongoing control of LDL, A1C, and BP.   LDL goal <70 --LDL goal <70, given carotid dz. Last LDL 61 and at goal. Continue current Crestor 20mg  dailly.  Hypertension, goal <130/80 --BP today controlled at 126/70. Goal BP <130/80. Recommend home BP monitoring. Continue current HCTZ 25mg  daily.  Carotid dz, occluded LICA --As above in HPI. LICA is chronically occluded with no recommendation for intervention per vascular surgery. See most recent US of carotids as  above under CV studies. Asymptomatic. Risk factor modification and medical therapy recommended by vascular surgery (over intervention) to prevent progression of R carotid artery dz. Continue ASA, statin, and fenofibrate. BP and glycemic control.  Lifestyle changes, including diet and exercise, also discussed with pt.   DM2 with  retinopathy and neuropathy --Glycemic control recommended. Last A1C 6.9. Continue monitoring and medications per PCP. Monitored by opthalmology for retinopathy. Reports ongoing sx of peripheral neuropathy.   Medication changes: None Labs ordered: None Studies / Imaging ordered: Echo Future considerations: Cardiac CT or Lexiscan Disposition: RTC after echo    Arvil Chaco, PA-C 01/20/2020

## 2020-01-20 NOTE — Patient Instructions (Signed)
Medication Instructions:  Your physician recommends that you continue on your current medications as directed. Please refer to the Current Medication list given to you today.  *If you need a refill on your cardiac medications before your next appointment, please call your pharmacy*   Lab Work: None ordered If you have labs (blood work) drawn today and your tests are completely normal, you will receive your results only by: Marland Kitchen MyChart Message (if you have MyChart) OR . A paper copy in the mail If you have any lab test that is abnormal or we need to change your treatment, we will call you to review the results.   Testing/Procedures: 1- Echo  Please return to [facility] on ______________ at _______________ AM/PM for an Echocardiogram. Your physician has requested that you have an echocardiogram. Echocardiography is a painless test that uses sound waves to create images of your heart. It provides your doctor with information about the size and shape of your heart and how well your heart's chambers and valves are working. This procedure takes approximately one hour. There are no restrictions for this procedure. Please note; depending on visual quality an IV may need to be placed.     Follow-Up: At [facility], you and your health needs are our priority.  As part of our continuing mission to provide you with exceptional heart care, we have created designated Provider Care Teams.  These Care Teams include your primary Cardiologist (physician) and Advanced Practice Providers (APPs -  Physician Assistants and Nurse Practitioners) who all work together to provide you with the care you need, when you need it.  We recommend signing up for the patient portal called "MyChart".  Sign up information is provided on this After Visit Summary.  MyChart is used to connect with patients for Virtual Visits (Telemedicine).  Patients are able to view lab/test results, encounter notes, upcoming  appointments, etc.  Non-urgent messages can be sent to your provider as well.   To learn more about what you can do with MyChart, go to [email or web address].    Your next appointment:   1 month(s)  The format for your next appointment:   In Person  Provider:     You may see Dr Fletcher Anon or Marrianne Mood, PA-C

## 2020-02-12 ENCOUNTER — Other Ambulatory Visit: Payer: PPO

## 2020-02-19 ENCOUNTER — Other Ambulatory Visit: Payer: Self-pay

## 2020-02-19 ENCOUNTER — Ambulatory Visit (INDEPENDENT_AMBULATORY_CARE_PROVIDER_SITE_OTHER): Payer: PPO

## 2020-02-19 DIAGNOSIS — R011 Cardiac murmur, unspecified: Secondary | ICD-10-CM | POA: Diagnosis not present

## 2020-02-19 MED ORDER — PERFLUTREN LIPID MICROSPHERE
1.0000 mL | INTRAVENOUS | Status: AC | PRN
  Administered 2020-02-19: 2 mL via INTRAVENOUS

## 2020-02-20 LAB — ECHOCARDIOGRAM COMPLETE
AR max vel: 1.62 cm2
AV Area VTI: 1.67 cm2
AV Area mean vel: 1.62 cm2
AV Mean grad: 10.3 mmHg
AV Peak grad: 20.3 mmHg
Ao pk vel: 2.25 m/s
Area-P 1/2: 3.21 cm2
S' Lateral: 3.2 cm

## 2020-02-22 NOTE — Progress Notes (Signed)
Office Visit    Patient Name: Miguel Bradley Date of Encounter: 02/23/2020  Primary Care Provider:  Jinny Sanders, MD Primary Cardiologist: Iverson Alamin  Chief Complaint    Chief Complaint  Patient presents with  . office visit    F/U after echo; Meds verbally reviewed with patient.    79 year old patient with history of hypertension, hyperlipidemia, carotid artery dz, HLD with goal LDL <70, DM2 with ophthalmic and neuropathy complication, and here for follow-up after referred to [facility] cardiology per referral from his PCP due to decreased mobility and endurance.  Past Medical History    Past Medical History:  Diagnosis Date  . Anemia   . Basal cell carcinoma of face   . Cataract   . Diabetes mellitus without complication ([place])   . Glaucoma   . Hypercalcemia   . Hyperparathyroidism, unspecified ([place])   . Impotence of organic origin   . Other testicular hypofunction   . Pure hyperglyceridemia   . Special screening for malignant neoplasm of prostate   . Special screening for malignant neoplasms, colon   . Unspecified essential hypertension    Past Surgical History:  Procedure Laterality Date  . COLONOSCOPY  06/13/2018   Dr. Lucio Edward  . PARATHYROIDECTOMY  2012  . skin cancer removal     Dr. Sharlett Iles  . TRIGGER FINGER RELEASE Left 2008    Allergies  No Known Allergies  History of Present Illness    Miguel Bradley is a 79 y.o. male with PMH as above. Per review of his PCP progress notes, he is suspected to be at high risk for CAD given new diagnosis of carotid disease with comorbid DM2, hyperlipidemia, and hypertension.  He was thus referred to cardiology for possible stress testing evaluation due to decreased mobility and endurance.  He has no previously known history of heart dz or arrhythmia. He denies a family history of sudden cardiac death at an early age.   He reports he quit smoking in 1980, at which time he smoked 3/4 a pack of filtered cigarettes  daily. He drinks 1 beer once a month on average. He has a history of diabetes with retinopathy (followed by ophthalmology) and bilateral neuropathy.   He was recently evaluated by Dr. Oneida Alar for chronic L internal carotid artery occlusion and noted to be asymptomatic with no benefit for carotid endarterectomy. Recommendation was for risk factor modification and medical therapy to prevent progression of the carotid dz on the contralateral side. He was continued on ASA and fenofibrate.    On 01/20/20, he was seen in clinic per PCP.  No cardiac sx reported. He did not have a regular workout routine but did golf at least one game a week (18 holes) and without any symptoms concerning for ischemia. He had been using a salt substitute to reduce daily intake of salt. He wanted to cut back on drinking diet soda (Diet Pepsi and Dr. Malachi Bonds 1-2 per day). He was drinking 2 of the 12 oz bottles of water daily, though trying to increase water intake by drinking the sugar free flavored water as of recently. No CP or SOB. No s/sx concerning for worsening carotid dz. No dizziness/presyncope or syncope. No reported unsteady gait but rather he was clumsy and bumped into things a lot, which was not new for him. He occasionally had numbness in his foot but no s/sx consistent with PAD or claudication. He was not checking BP at home. He was bradycardic with QRS now  progressed to LBBB.  A new systolic murmur was heard on exam with pt denying a past h/o murmur and subsequent echo showing mild aortic stenosis as below. Future stress test versus cardiac CT was discussed.   Today, 02/23/2020, he returns to clinic and is joined by his daughter.  His echocardiogram is reviewed in detail, as well as his EKG which is reviewed by DOD today as well with repeat EKG recommended to confirm.  Discussed further testing given his risk factors for cardiovascular disease, as well as recent EKGs as outlined below. Discussed both cardiac CT versus stress  testing.  He reports some hesitation to proceed with any further testing based on his echo experience and reassurances provided. He continues to deny any significant symptoms including chest pain, palpitations, pnd, orthopnea, n, v, dizziness, syncope, edema, weight gain, or early satiety.  He reports some dyspnea that occurs with exertion and he finds in line with his current age.  BP today 120/70 with HR 57 bpm.  No signs or symptoms of bleeding.  He reports ongoing clumsiness but not gait instability, though his daughter does note some gait instability in the past.  Medication compliance noted. Discussion regarding obtaining primary cardiac MD today as well.    Home Medications    Prior to Admission medications   Medication Sig Start Date End Date Taking? Authorizing Provider  aspirin EC 81 MG tablet Take 81 mg by mouth daily.   Yes [provider]  Cholecalciferol (VITAMIN D) 2000 UNITS tablet Take 2,000 Units by mouth daily.     Yes [provider]  dorzolamide-timolol (COSOPT) 22.3-6.8 MG/ML ophthalmic solution Place 1 drop into both eyes 2 (two) times daily.  08/18/15  Yes [provider]  fenofibrate 160 MG tablet TAKE 1 TABLET BY MOUTH EVERY DAY 09/01/19  Yes Bedsole, Amy E, MD  hydrochlorothiazide (HYDRODIURIL) 25 MG tablet TAKE 1 TABLET BY MOUTH EVERY DAY 08/04/19  Yes Bedsole, Amy E, MD  latanoprost (XALATAN) 0.005 % ophthalmic solution Place 1 drop into both eyes at bedtime.  11/23/14  Yes [provider]  MEGARED OMEGA-3 KRILL OIL PO Take 1 capsule by mouth daily.   Yes [provider]  metFORMIN (GLUCOPHAGE) 500 MG tablet TAKE ONE TABLET BY MOUTH TWICE DAILY WITH A MEAL 01/13/20  Yes Bedsole, Amy E, MD  rosuvastatin (CRESTOR) 20 MG tablet TAKE 1 TABLET BY MOUTH EVERY DAY 02/18/19  Yes Bedsole, Amy E, MD    Review of Systems    He denies chest pain, palpitations, pnd, orthopnea, n, v, dizziness, syncope, edema, weight gain, or early satiety.   He  reports some dyspnea ongoing for some time that he feels is consistent with his age and that occurs with exertion.  All other systems reviewed and are otherwise negative except as noted above.  Physical Exam    VS:  BP 120/70 (BP Location: Left Arm, Patient Position: Sitting, Cuff Size: Large)   Pulse (!) 57   Ht 5\' 10"  (1.778 m)   Wt 216 lb 6 oz (98.1 kg)   SpO2 93%   BMI 31.05 kg/m  , BMI Body mass index is 31.05 kg/m. GEN: Well nourished, well developed, in no acute distress. HEENT: normal. Neck: Supple, no JVD, carotid bruits, or masses. Cardiac: bradycardic but regular, 2/6 systolic murmur best appreciated RUSB. No rubs, or gallops. No clubbing, cyanosis, edema.  Radials/DP/PT 2+ and equal bilaterally.  Respiratory:  Respirations regular and unlabored, clear to auscultation bilaterally. GI: Soft, nontender, nondistended, BS +  x 4. MS: no deformity or atrophy. Skin: warm and dry, no rash.  Neuro:  Strength and sensation are intact. Psych: Normal affect.  Accessory Clinical Findings    ECG personally reviewed by me today -SB, 57bpm, RBBB and discussed with DOD given comparison to previous tracing with new TWI in III, avF felt by DOD more consistent with lead placement error and previous LAD/LBBB now more consistent with RBBB. Further workup discussed as below- no acute changes.  VITALS Reviewed today   Temp Readings from Last 3 Encounters:  12/30/19 98.5 F (36.9 C) (Temporal)  07/02/19 98 F (36.7 C) (Temporal)  07/01/19 98.1 F (36.7 C) (Temporal)   BP Readings from Last 3 Encounters:  02/23/20 120/70  01/20/20 126/70  12/30/19 120/72   Pulse Readings from Last 3 Encounters:  02/23/20 (!) 57  01/20/20 (!) 55  12/30/19 (!) 55    Wt Readings from Last 3 Encounters:  02/23/20 216 lb 6 oz (98.1 kg)  01/20/20 216 lb 8 oz (98.2 kg)  12/30/19 220 lb 8 oz (100 kg)     LABS  reviewed today   Lab Results  Component Value Date   WBC 8.6 11/16/2009   HGB 16.5  11/16/2009   HCT 47.9 11/16/2009   MCV 95.0 11/16/2009   PLT 308.0 11/16/2009   Lab Results  Component Value Date   CREATININE 1.25 12/26/2019   BUN 20 12/26/2019   NA 135 12/26/2019   K 3.6 12/26/2019   CL 102 12/26/2019   CO2 24 12/26/2019   Lab Results  Component Value Date   ALT 13 12/26/2019   AST 17 12/26/2019   ALKPHOS 56 12/26/2019   BILITOT 0.6 12/26/2019   Lab Results  Component Value Date   CHOL 119 12/26/2019   HDL 31.20 (L) 12/26/2019   LDLCALC 54 11/28/2013   LDLDIRECT 61.0 12/26/2019   TRIG 220.0 (H) 12/26/2019   CHOLHDL 4 12/26/2019    Lab Results  Component Value Date   HGBA1C 6.9 (H) 12/26/2019   Lab Results  Component Value Date   TSH 0.85 11/16/2009     STUDIES/PROCEDURES reviewed today   Echo 02/19/20 1. Left ventricular ejection fraction, by estimation, is 60 to 65%. The  left ventricle has normal function. The left ventricle has no regional  wall motion abnormalities. There is mild left ventricular hypertrophy.  Left ventricular diastolic parameters  are consistent with Grade I diastolic dysfunction (impaired relaxation).  2. Right ventricular systolic function is normal. The right ventricular  size is normal.  3. Mild aortic valve stenosis.   Carotid US Right Carotid: Velocities in the right ICA are consistent with a 1-39%  stenosis. Non-hemodynamically significant plaque <50% noted in the  CCA.  Left Carotid: Evidence consistent with a total occlusion of the left ICA.  Non-hemodynamically significant plaque <50% noted in the CCA.  Vertebrals: Bilateral vertebral arteries demonstrate antegrade flow.  Subclavians: Normal flow hemodynamics were seen in bilateral subclavian        arteries.   Assessment & Plan   Mild Aortic stenosis --Systolic murmur heard on exam at last visit with echo obtained and showing mild AS. Pt asymptomatic and denies s/sx of valvular dz. Denies h/o murmur in the past. Continue to monitor with  annual echo.   New Bundle Branch block --Pt asymptomatic. Previous EKG and most recent EKG with discrepancy and reviewed by DOD given RBBB on most recent EKG with TWI in III, avF thought likely 2/2 lead placement and most consistent  with RBBB. Pt reassured by discussion during his echo and thus hesitant for further workup; therefore, subsequent explanation of structural versus electrical versus plumbing / CAD heart health. Give risk factors and EKG, and after discussion with DOD, recommendation was for cardiac CT as discussed with pt and with pt agreeable.  Reduced mobility and endurance --Pt denies reduced mobility and endurance. He reports a history of "clumsiness," which has been ongoing for years. He also reports paresthesias but denies s/sx concerning for PAD. Echo updated with nl EF; however, he does have risk factors for cardiac dz, including a past history of smoking and EKGs with new bundle as above; therefore, after discussion with DOD, will obtain cardiac CT.  Reviewed recommendations for lifestyle modifications and ongoing control of LDL, A1C, and BP.   LDL goal <70 --LDL goal <70, given carotid dz. Last LDL 61 and at goal. Continue current Crestor 20mg  dailly.  Hypertension, goal <130/80 --BP today controlled. Goal BP <130/80. Recommend home BP monitoring. Continue current HCTZ 25mg  daily.  Carotid dz, occluded LICA --As above in HPI. LICA is chronically occluded with no recommendation for intervention per vascular surgery. See most recent US of carotids as above under CV studies. Asymptomatic. Risk factor modification and medical therapy recommended by vascular surgery (over intervention) to prevent progression of R carotid artery dz. Continue ASA, statin, and fenofibrate. BP and glycemic control.  Lifestyle changes, including diet and exercise, also discussed with pt.   DM2 with retinopathy and neuropathy --Glycemic control recommended. Last A1C 6.9. Continue monitoring and  medications per PCP. Monitored by opthalmology for retinopathy. Reports ongoing sx of peripheral neuropathy.   Medication changes: None Labs ordered: None Studies / Imaging ordered: cardiac CT Disposition: RTC after cardiac CT, establish with primary MD  Arvil Chaco, PA-C 02/23/2020

## 2020-02-23 ENCOUNTER — Other Ambulatory Visit: Payer: Self-pay

## 2020-02-23 ENCOUNTER — Encounter: Payer: Self-pay | Admitting: Physician Assistant

## 2020-02-23 ENCOUNTER — Ambulatory Visit: Payer: PPO | Admitting: Physician Assistant

## 2020-02-23 VITALS — BP 120/70 | HR 57 | Ht 70.0 in | Wt 216.4 lb

## 2020-02-23 DIAGNOSIS — I350 Nonrheumatic aortic (valve) stenosis: Secondary | ICD-10-CM | POA: Diagnosis not present

## 2020-02-23 DIAGNOSIS — I6522 Occlusion and stenosis of left carotid artery: Secondary | ICD-10-CM | POA: Diagnosis not present

## 2020-02-23 DIAGNOSIS — R0609 Other forms of dyspnea: Secondary | ICD-10-CM

## 2020-02-23 DIAGNOSIS — I208 Other forms of angina pectoris: Secondary | ICD-10-CM | POA: Diagnosis not present

## 2020-02-23 DIAGNOSIS — I10 Essential (primary) hypertension: Secondary | ICD-10-CM | POA: Diagnosis not present

## 2020-02-23 DIAGNOSIS — R072 Precordial pain: Secondary | ICD-10-CM

## 2020-02-23 DIAGNOSIS — E785 Hyperlipidemia, unspecified: Secondary | ICD-10-CM

## 2020-02-23 DIAGNOSIS — R0600 Dyspnea, unspecified: Secondary | ICD-10-CM | POA: Diagnosis not present

## 2020-02-23 DIAGNOSIS — I4510 Unspecified right bundle-branch block: Secondary | ICD-10-CM | POA: Diagnosis not present

## 2020-02-23 DIAGNOSIS — E11319 Type 2 diabetes mellitus with unspecified diabetic retinopathy without macular edema: Secondary | ICD-10-CM

## 2020-02-23 MED ORDER — METOPROLOL TARTRATE 50 MG PO TABS: 50.0000 mg | ORAL_TABLET | Freq: Once | ORAL | 0 refills | Status: DC

## 2020-02-23 NOTE — Patient Instructions (Signed)
Medication Instructions:  Your physician recommends that you continue on your current medications as directed. Please refer to the Current Medication list given to you today.  *If you need a refill on your cardiac medications before your next appointment, please call your pharmacy*   Lab Work: Your physician recommends that you have lab work today(BMET)  If you have labs (blood work) drawn today and your tests are completely normal, you will receive your results only by: Marland Kitchen MyChart Message (if you have MyChart) OR . A paper copy in the mail If you have any lab test that is abnormal or we need to change your treatment, we will call you to review the results.   Testing/Procedures: Your cardiac CT will be scheduled at one of the below locations:   [facility] [address] [place], [place] [address] [phone]  [place] [address] [place], [place] [address] [phone]  If scheduled at [facility], please arrive at the [facility] main entrance of [facility] 30 minutes prior to test start time. Proceed to the [facility] Radiology Department (first floor) to check-in and test prep.  If scheduled at [facility], please arrive 15 mins early for check-in and test prep.  Please follow these instructions carefully (unless otherwise directed):  Hold all erectile dysfunction medications at least 3 days (72 hrs) prior to test.  On the Night Before the Test: . Be sure to Drink plenty of water. . Do not consume any caffeinated/decaffeinated beverages or chocolate 12 hours prior to your test. . Do not take any antihistamines 12 hours prior to your test.  On the Day of the Test: . Drink plenty of water. Do not drink any water within one hour of the test. . Do not eat any food 4 hours prior to the test. . You may take your regular medications prior to the test.  . Take  metoprolol (Lopressor) two hours prior to test. . HOLD Furosemide/Hydrochlorothiazide morning of the test.      After the Test: . Drink plenty of water. . After receiving IV contrast, you may experience a mild flushed feeling. This is normal. . On occasion, you may experience a mild rash up to 24 hours after the test. This is not dangerous. If this occurs, you can take Benadryl 25 mg and increase your fluid intake. . If you experience trouble breathing, this can be serious. If it is severe call 911 IMMEDIATELY. If it is mild, please call our office. . If you take any of these medications: Glipizide/Metformin, Avandament, Glucavance, please do not take 48 hours after completing test unless otherwise instructed.   Once we have confirmed authorization from your insurance company, we will call you to set up a date and time for your test. Based on how quickly your insurance processes prior authorizations requests, please allow up to 4 weeks to be contacted for scheduling your Cardiac CT appointment. Be advised that routine Cardiac CT appointments could be scheduled as many as 8 weeks after your provider has ordered it.  For non-scheduling related questions, please contact the cardiac imaging nurse navigator should you have any questions/concerns: Marchia Bond, Cardiac Imaging Nurse Navigator Burley Saver, Interim Cardiac Imaging Nurse [place] [facility] Direct Office Dial: [phone]   For scheduling needs, including cancellations and rescheduling, please call Vivien Rota at [phone], option 3.      Follow-Up: At [facility], you  and your health needs are our priority.  As part of our continuing mission to provide you with exceptional heart care, we have created designated Provider Care Teams.  These Care Teams include your primary Cardiologist (physician) and Advanced Practice Providers (APPs -  Physician Assistants and Nurse Practitioners) who all work together to  provide you with the care you need, when you need it.  We recommend signing up for the patient portal called "MyChart".  Sign up information is provided on this After Visit Summary.  MyChart is used to connect with patients for Virtual Visits (Telemedicine).  Patients are able to view lab/test results, encounter notes, upcoming appointments, etc.  Non-urgent messages can be sent to your provider as well.   To learn more about what you can do with MyChart, go to [email or web address].    Your next appointment:   4-6 week(s)  The format for your next appointment:   In Person  Provider:   Please establish primary cardiologist at your next office visit.    Other Instructions

## 2020-02-24 LAB — BASIC METABOLIC PANEL
BUN/Creatinine Ratio: 15 (ref 10–24)
BUN: 17 mg/dL (ref 8–27)
CO2: 24 mmol/L (ref 20–29)
Calcium: 9.8 mg/dL (ref 8.6–10.2)
Chloride: 101 mmol/L (ref 96–106)
Creatinine, Ser: 1.13 mg/dL (ref 0.76–1.27)
GFR calc Af Amer: 72 mL/min/{1.73_m2} (ref >59)
GFR calc non Af Amer: 62 mL/min/{1.73_m2} (ref >59)
Glucose: 122 mg/dL — ABNORMAL HIGH (ref 65–99)
Potassium: 4.3 mmol/L (ref 3.5–5.2)
Sodium: 138 mmol/L (ref 134–144)

## 2020-03-02 ENCOUNTER — Telehealth (HOSPITAL_COMMUNITY): Payer: Self-pay | Admitting: Emergency Medicine

## 2020-03-02 NOTE — Telephone Encounter (Signed)
Reaching out to patient to offer assistance regarding upcoming cardiac imaging study; pt verbalizes understanding of appt date/time, parking situation and where to check in, pre-test NPO status and medications ordered, and verified current allergies; name and call back number provided for further questions should they arise Marchia Bond RN Navigator Cardiac Imaging [facility] [phone] office [phone] cell  Pt holding HCTZ the day of test. Encouraged to drink plenty of water, holding metformin the day of test and 2 days after (resuming on Saturday). Pt verbalized understanding and appreciated the call

## 2020-03-04 ENCOUNTER — Ambulatory Visit
Admission: RE | Admit: 2020-03-04 | Discharge: 2020-03-04 | Disposition: A | Discharge status: Home / Self Care | Payer: PPO | Source: Ambulatory Visit | Attending: Physician Assistant | Admitting: Physician Assistant

## 2020-03-04 ENCOUNTER — Other Ambulatory Visit: Payer: Self-pay

## 2020-03-04 DIAGNOSIS — I2510 Atherosclerotic heart disease of native coronary artery without angina pectoris: Secondary | ICD-10-CM | POA: Diagnosis not present

## 2020-03-04 DIAGNOSIS — I208 Other forms of angina pectoris: Secondary | ICD-10-CM | POA: Diagnosis not present

## 2020-03-04 MED ORDER — IOHEXOL 350 MG/ML SOLN
90.0000 mL | Freq: Once | INTRAVENOUS | Status: AC | PRN
  Administered 2020-03-04: 90 mL via INTRAVENOUS

## 2020-03-04 MED ORDER — NITROGLYCERIN 0.4 MG SL SUBL
0.8000 mg | SUBLINGUAL_TABLET | Freq: Once | SUBLINGUAL | Status: AC
  Administered 2020-03-04: 0.8 mg via SUBLINGUAL

## 2020-03-04 NOTE — Progress Notes (Signed)
Patient tolerated procedure well. Ambulate w/o difficulty. Sitting in chair drinking water provided. Encouraged to drink extra water today and reasoning. Verbalized understanding. All questions answered. ABC intact. No further needs. Discharge from procedure area w/o issues.

## 2020-03-05 ENCOUNTER — Other Ambulatory Visit: Payer: Self-pay | Admitting: Family Medicine

## 2020-03-05 ENCOUNTER — Telehealth: Payer: Self-pay | Admitting: *Deleted

## 2020-03-05 DIAGNOSIS — I2510 Atherosclerotic heart disease of native coronary artery without angina pectoris: Secondary | ICD-10-CM | POA: Diagnosis not present

## 2020-03-05 NOTE — Telephone Encounter (Signed)
No answer. Left message to call back.  Hold placed on appointment with Dr End on 03/10/20.

## 2020-03-05 NOTE — Telephone Encounter (Signed)
-----   Message from Arvil Chaco, PA-C sent at 03/05/2020  2:46 PM EDT ----- High coronary artery calcium score of 2,596, which places him at the 90th percentile for males in his age group.  Results show buildup of plaque in multiple vessels with narrowing of these vessels of the heart.  Given these findings, an additional test was performed to measure the blood flow through these blocked vessels.  This additional test value is not yet resulted; however, given these findings, recommend we schedule a clinic visit with Dr. Saunders Revel earlier than currently scheduled to discuss this further as well as further workup with catheterization. Are we able to get him in to see Dr. Saunders Revel early next week?

## 2020-03-08 NOTE — Telephone Encounter (Signed)
The patient has been notified of the result and verbalized understanding.  All questions (if any) were answered. Eliberto Ivory Satrina Magallanes, RN 03/08/2020 9:46 AM   Patient's daughter is out of town this week for work. He said he likes to have her come with him to appointments so she can hear what's going on. Sometimes he forgets what's going on after the appointment.  Advised Dr End may be able to call her to discuss if needed.  He was appreciative.

## 2020-03-10 ENCOUNTER — Other Ambulatory Visit
Admission: RE | Admit: 2020-03-10 | Discharge: 2020-03-10 | Disposition: A | Discharge status: Home / Self Care | Payer: PPO | Attending: Internal Medicine | Admitting: Internal Medicine

## 2020-03-10 ENCOUNTER — Other Ambulatory Visit: Payer: Self-pay

## 2020-03-10 ENCOUNTER — Encounter: Payer: Self-pay | Admitting: Internal Medicine

## 2020-03-10 ENCOUNTER — Ambulatory Visit (INDEPENDENT_AMBULATORY_CARE_PROVIDER_SITE_OTHER): Payer: PPO | Admitting: Internal Medicine

## 2020-03-10 VITALS — BP 120/64 | HR 60 | Ht 70.0 in | Wt 213.4 lb

## 2020-03-10 DIAGNOSIS — I350 Nonrheumatic aortic (valve) stenosis: Secondary | ICD-10-CM | POA: Diagnosis not present

## 2020-03-10 DIAGNOSIS — I6522 Occlusion and stenosis of left carotid artery: Secondary | ICD-10-CM | POA: Diagnosis not present

## 2020-03-10 DIAGNOSIS — I10 Essential (primary) hypertension: Secondary | ICD-10-CM | POA: Diagnosis not present

## 2020-03-10 DIAGNOSIS — Z01810 Encounter for preprocedural cardiovascular examination: Secondary | ICD-10-CM | POA: Diagnosis not present

## 2020-03-10 DIAGNOSIS — E782 Mixed hyperlipidemia: Secondary | ICD-10-CM | POA: Diagnosis not present

## 2020-03-10 DIAGNOSIS — E1159 Type 2 diabetes mellitus with other circulatory complications: Secondary | ICD-10-CM

## 2020-03-10 DIAGNOSIS — I2510 Atherosclerotic heart disease of native coronary artery without angina pectoris: Secondary | ICD-10-CM | POA: Diagnosis not present

## 2020-03-10 DIAGNOSIS — R931 Abnormal findings on diagnostic imaging of heart and coronary circulation: Secondary | ICD-10-CM

## 2020-03-10 LAB — BASIC METABOLIC PANEL
Anion gap: 11 (ref 5–15)
BUN: 17 mg/dL (ref 8–23)
CO2: 24 mmol/L (ref 22–32)
Calcium: 9.6 mg/dL (ref 8.9–10.3)
Chloride: 104 mmol/L (ref 98–111)
Creatinine, Ser: 1.31 mg/dL — ABNORMAL HIGH (ref 0.61–1.24)
GFR calc Af Amer: >60 mL/min (ref >60)
GFR calc non Af Amer: 52 mL/min — ABNORMAL LOW (ref >60)
Glucose, Bld: 108 mg/dL — ABNORMAL HIGH (ref 70–99)
Potassium: 3.8 mmol/L (ref 3.5–5.1)
Sodium: 139 mmol/L (ref 135–145)

## 2020-03-10 LAB — CBC WITH DIFFERENTIAL/PLATELET
Abs Immature Granulocytes: 0.03 10*3/uL (ref 0.00–0.07)
Basophils Absolute: 0.1 10*3/uL (ref 0.0–0.1)
Basophils Relative: 1 %
Eosinophils Absolute: 0.4 10*3/uL (ref 0.0–0.5)
Eosinophils Relative: 4 %
HCT: 46.3 % (ref 39.0–52.0)
Hemoglobin: 16.1 g/dL (ref 13.0–17.0)
Immature Granulocytes: 0 %
Lymphocytes Relative: 25 %
Lymphs Abs: 2.2 10*3/uL (ref 0.7–4.0)
MCH: 31.5 pg (ref 26.0–34.0)
MCHC: 34.8 g/dL (ref 30.0–36.0)
MCV: 90.6 fL (ref 80.0–100.0)
Monocytes Absolute: 0.7 10*3/uL (ref 0.1–1.0)
Monocytes Relative: 8 %
Neutro Abs: 5.4 10*3/uL (ref 1.7–7.7)
Neutrophils Relative %: 62 %
Platelets: 271 10*3/uL (ref 150–400)
RBC: 5.11 MIL/uL (ref 4.22–5.81)
RDW: 13.2 % (ref 11.5–15.5)
WBC: 8.8 10*3/uL (ref 4.0–10.5)
nRBC: 0 % (ref 0.0–0.2)

## 2020-03-10 NOTE — H&P (View-Only) (Signed)
Follow-up Outpatient Visit Date: 03/10/2020  Primary Care Provider: Jinny Sanders, MD [place] [address]  Chief Complaint: Follow-up abnormal cardiac CTA  HPI:  Mr. Miguel Bradley is a 79 y.o. male with history of recently diagnosed multivessel CAD by cardiac CTA, carotid artery stenosis, hypertension, hyperlipidemia, and type 2 diabetes mellitus, who presents for follow-up of abnormal cardiac CTA in the setting of fatigue.  He has been seen twice over the last 2 months by Marrianne Mood, PA, initially for assessment of continued.  Echocardiogram earlier this month showed normal LVEF with mild LVH and grade 1 diastolic dysfunction.  Mild aortic stenosis was also noted.  Subsequent cardiac CTA showed multivessel coronary artery disease including diffuse LAD disease that was not hemodynamically significant by CT FFR in mid and distal vessel, as well as chronic total occlusion of the mid RCA.  Today, Mr. Haggart reports he has been feeling well.  He continues to push mow and play golf on a regular basis without any symptoms.  He has not had chest pain, shortness of breath, palpitations, lightheadedness, or edema.  His only complaint is that it sometimes feels like his feet are numb.  He otherwise denies focal neurologic deficits.  --------------------------------------------------------------------------------------------------  Cardiovascular History & Procedures: Cardiovascular Problems:  Coronary artery disease  Aortic stenosis  Risk Factors:  Known CAD, hypertension, hyperlipidemia, diabetes mellitus, male gender, prior tobacco use, obesity, and age greater than 72  Cath/PCI:  None  CV Surgery:  None  EP Procedures and Devices:  None  Non-Invasive Evaluation(s):  Cardiac CTA (03/04/2020): High coronary artery calcium score (2596): LMCA normal.  LAD with heavy calcification in the proximal segment with 70 to 99% stenosis.  First diagonal branch has 50 to  69% stenosis.  Nondominant LCx is normal.  Dominant RCA has mixed calcified and noncalcified plaque with 70 and 99% stenosis in the mid vessel.  CT FFR shows significant flow reduction in the mid and distal vessel (0.78 in the mid segment and 0.71 in the distal vessel).  There is a total occlusion of the mid RCA.  TTE (02/19/2020): Normal LV size with mild LVH.  Grade 1 diastolic dysfunction.  LVEF 60-65%.  Normal RV size and function.  Mild aortic stenosis (mean gradient 10 mmHg).  Recent CV Pertinent Labs: Lab Results  Component Value Date   CHOL 119 12/26/2019   HDL 31.20 (L) 12/26/2019   LDLCALC 54 11/28/2013   LDLDIRECT 61.0 12/26/2019   TRIG 220.0 (H) 12/26/2019   CHOLHDL 4 12/26/2019   K 3.8 03/10/2020   BUN 17 03/10/2020   BUN 17 02/23/2020   CREATININE 1.31 (H) 03/10/2020    Past medical and surgical history were reviewed and updated in EPIC.  Current Meds  Medication Sig  . aspirin EC 81 MG tablet Take 81 mg by mouth daily.  . Cholecalciferol (VITAMIN D) 2000 UNITS tablet Take 2,000 Units by mouth daily.    . dorzolamide-timolol (COSOPT) 22.3-6.8 MG/ML ophthalmic solution Place 1 drop into both eyes 2 (two) times daily.   . fenofibrate 160 MG tablet TAKE 1 TABLET BY MOUTH EVERY DAY  . hydrochlorothiazide (HYDRODIURIL) 25 MG tablet TAKE 1 TABLET BY MOUTH EVERY DAY  . latanoprost (XALATAN) 0.005 % ophthalmic solution Place 1 drop into both eyes at bedtime.   . metFORMIN (GLUCOPHAGE) 500 MG tablet TAKE ONE TABLET BY MOUTH TWICE DAILY WITH A MEAL  . metoprolol tartrate (LOPRESSOR) 50 MG tablet Take 1 tablet (50 mg total) by  mouth once for 1 dose. Once time only dose, 2 hours prior to imaging  . Omega-3 Fatty Acids (FISH OIL OMEGA-3 PO) Take 1 capsule by mouth daily.  . rosuvastatin (CRESTOR) 20 MG tablet TAKE 1 TABLET BY MOUTH EVERY DAY    Allergies: Patient has no known allergies.  Social History   Tobacco Use  . Smoking status: Former Smoker    Years: 25.00    Quit  date: 06/12/1978    Years since quitting: 41.7  . Smokeless tobacco: Never Used  Vaping Use  . Vaping Use: Never used  Substance Use Topics  . Alcohol use: Yes    Comment: 1 beer every few weeks  . Drug use: No    Family History  Problem Relation Age of Onset  . Hypertension Mother   . Dementia Mother   . Diabetes Other        aunt  . Hypertension Brother   . Hypertension Brother   . Hypertension Brother   . Hyperlipidemia Brother   . Hyperlipidemia Brother   . Hyperlipidemia Brother   . Hypertension Sister   . Hyperlipidemia Sister   . Colon cancer Neg Hx   . Esophageal cancer Neg Hx   . Rectal cancer Neg Hx   . Stomach cancer Neg Hx     Review of Systems: A 12-system review of systems was performed and was negative except as noted in the HPI.  --------------------------------------------------------------------------------------------------  Physical Exam: BP 120/64 (BP Location: Left Arm, Patient Position: Sitting, Cuff Size: Normal)   Pulse 60   Ht 5\' 10"  (1.778 m)   Wt 213 lb 6 oz (96.8 kg)   SpO2 96%   BMI 30.62 kg/m   General: NAD.  Accompanied by his girlfriend. HEENT: No conjunctival pallor or scleral icterus. Facemask in place. Neck: No JVD or HJR. Lungs: Normal work of breathing. Clear to auscultation bilaterally without wheezes or crackles. Heart: Regular rate and rhythm with 2/6 systolic murmur.  No rubs or gallops. Abd: Bowel sounds present. Soft, NT/ND without hepatosplenomegaly Ext: No lower extremity edema. Radial, PT, and DP pulses are 2+ bilaterally. Skin: Warm and dry without rash.  EKG: Normal sinus rhythm with right axis deviation and nonspecific intraventricular conduction delay.  No significant change from prior tracing on 02/23/2020.  Lab Results  Component Value Date   WBC 8.8 03/10/2020   HGB 16.1 03/10/2020   HCT 46.3 03/10/2020   MCV 90.6 03/10/2020   PLT 271 03/10/2020    Lab Results  Component Value Date   NA 139  03/10/2020   K 3.8 03/10/2020   CL 104 03/10/2020   CO2 24 03/10/2020   BUN 17 03/10/2020   CREATININE 1.31 (H) 03/10/2020   GLUCOSE 108 (H) 03/10/2020   ALT 13 12/26/2019    Lab Results  Component Value Date   CHOL 119 12/26/2019   HDL 31.20 (L) 12/26/2019   LDLCALC 54 11/28/2013   LDLDIRECT 61.0 12/26/2019   TRIG 220.0 (H) 12/26/2019   CHOLHDL 4 12/26/2019    --------------------------------------------------------------------------------------------------  ASSESSMENT AND PLAN: Coronary artery disease without angina: Mr. Kroening remains asymptomatic despite recent cardiac CTA demonstrating significant two-vessel coronary artery disease (CT FFR positive diffuse LAD disease as well as chronic total occlusion of the RCA).  Although he does not have symptoms, we discussed the importance of better delineation of his coronary artery disease as he may be having silent ischemia with his history of diabetes mellitus.  If he has significant to her three-vessel CAD,  there may be a mortality benefit associated with revascularization.  I have recommended that we proceed with cardiac catheterization and possible PCI, which Mr. Pyle is in agreement with.  I have reviewed the risks, indications, and alternatives to cardiac catheterization, possible angioplasty, and stenting with the patient. Risks include but are not limited to bleeding, infection, vascular injury, stroke, myocardial infection, arrhythmia, kidney injury, radiation-related injury in the case of prolonged fluoroscopy use, emergency cardiac surgery, and death. The patient understands the risks of serious complication is 1-2 in 6578 with diagnostic cardiac cath and 1-2% or less with angioplasty/stenting.  In the meantime, he should continue aspirin 81 mg daily and rosuvastatin 20 mg daily.  Carotid artery stenosis: Other than bilateral foot numbness most consistent with diabetic neuropathy, Mr. Gloss denies neurologic changes in spite of  known occlusion of the left carotid artery.  Continue aspirin and statin therapy as well as ongoing follow-up with vascular surgery.  Type 2 diabetes mellitus: Hemoglobin A1c well controlled.  Continue Metformin with ongoing follow-up per Dr. Diona Browner.  Hyperlipidemia: LDL well controlled.  Triglycerides remain somewhat elevated despite being on fenofibrate.  We will continue rosuvastatin and fenofibrate, though I would have a low threshold for discontinuation of fenofibrate in the future and further escalation of rosuvastatin.  Addition of Vascepa could also be considered in lieu of fenofibrate in the future if triglycerides remain elevated.  Aortic stenosis: Noted on recent echocardiogram and found to be mild.  No symptoms at this time.  Continue clinical follow-up with plan for repeat echocardiogram in 2 to 3 years, sooner if symptoms develop.  Hypertension: Blood pressure controlled.  No medication changes at this time.  Follow-up: To be determined based on results of catheterization.  Nelva Bush, MD 03/11/2020 11:39 AM

## 2020-03-10 NOTE — Patient Instructions (Signed)
Medication Instructions:  Your physician recommends that you continue on your current medications as directed. Please refer to the Current Medication list given to you today.  *If you need a refill on your cardiac medications before your next appointment, please call your pharmacy*   Lab Work: Your physician recommends that you return for lab work in: [place], [place]. - Please go to the [facility]. You will check in at the front desk to the right as you walk into the atrium. Valet Parking is offered if needed. - No appointment needed. You may go any day between 7 am and 6 pm.  COVID PRE- TEST: You will need a COVID TEST prior to the procedure:  LOCATION: [facility] site.  DATE/TIME:  On Friday, March 12, 2020 sometime between 8 am and 1 pm.  If you have labs (blood work) drawn today and your tests are completely normal, you will receive your results only by: Marland Kitchen MyChart Message (if you have MyChart) OR . A paper copy in the mail If you have any lab test that is abnormal or we need to change your treatment, we will call you to review the results.   Testing/Procedures:   You are scheduled for a Left Cardiac Catheterization on Tuesday, October 5 with Dr. Harrell Gave End.  1. Please arrive at the [facility] at 8:30 AM (This time is one hour before your procedure to ensure your preparation). Free valet parking service is available.   Special note: Every effort is made to have your procedure done on time. Please understand that emergencies sometimes delay scheduled procedures.  2. Diet: Do not eat solid foods after midnight.  The patient may have clear liquids until 5am upon the day of the procedure.  3. Labs: You will need to have blood drawn on Wednesday, September 29 at [facility], Go to 1st desk on your right to register.  Address: [address], [place] [address]  Open: 8am - 5pm  Phone: [phone]. You do not  need to be fasting.  4. Medication instructions in preparation for your procedure:   Contrast Allergy: No  Stop taking, HTCZ (Hydrochlorothiazide) Monday, October 4,  Do not take Diabetes Med Glucophage (Metformin) on the day of the procedure and HOLD 48 HOURS AFTER THE PROCEDURE.  On the morning of your procedure, take your Aspirin and any morning medicines NOT listed above.  You may use sips of water.  5. Plan for one night stay--bring personal belongings. 6. Bring a current list of your medications and current insurance cards. 7. You MUST have a responsible person to drive you home. 8. Someone MUST be with you the first 24 hours after you arrive home or your discharge will be delayed. 9. Please wear clothes that are easy to get on and off and wear slip-on shoes.  Thank you for allowing [place] to care for you!   -- [facility]    Follow-Up: At [facility], you and your health needs are our priority.  As part of our continuing mission to provide you with exceptional heart care, we have created designated Provider Care Teams.  These Care Teams include your primary Cardiologist (physician) and Advanced Practice Providers (APPs -  Physician Assistants and Nurse Practitioners) who all work together to provide you with the care you need, when you need it.  We recommend signing up for the patient portal called "MyChart".  Sign up information  is provided on this After Visit Summary.  MyChart is used to connect with patients for Virtual Visits (Telemedicine).  Patients are able to view lab/test results, encounter notes, upcoming appointments, etc.  Non-urgent messages can be sent to your provider as well.   To learn more about what you can do with MyChart, go to [email or web address].    Your next appointment:   To be determined post cath.  The format for your next appointment:   In Person  Provider:   You may see DR Harrell Gave END or one of the  following Advanced Practice Providers on your designated Care Team:    Murray Hodgkins, NP  Christell Faith, PA-C  Marrianne Mood, PA-C  Cadence Kathlen Mody, [place]    Coronary Angiogram With Stent Coronary angiogram with stent placement is a procedure to widen or open a narrow blood vessel of the heart (coronary artery). Arteries may become blocked by cholesterol buildup (plaques) in the lining of the artery wall. When a coronary artery becomes partially blocked, blood flow to that area decreases. This may lead to chest pain or a heart attack (myocardial infarction). A stent is a small piece of metal that looks like mesh or spring. Stent placement may be done as treatment after a heart attack, or to prevent a heart attack if a blocked artery is found by a coronary angiogram. Let your health care provider know about:  Any allergies you have, including allergies to medicines or contrast dye.  All medicines you are taking, including vitamins, herbs, eye drops, creams, and over-the-counter medicines.  Any problems you or family members have had with anesthetic medicines.  Any blood disorders you have.  Any surgeries you have had.  Any medical conditions you have, including kidney problems or kidney failure.  Whether you are pregnant or may be pregnant.  Whether you are breastfeeding. What are the risks? Generally, this is a safe procedure. However, serious problems may occur, including:  Damage to nearby structures or organs, such as the heart, blood vessels, or kidneys.  A return of blockage.  Bleeding, infection, or bruising at the insertion site.  A collection of blood under the skin (hematoma) at the insertion site.  A blood clot in another part of the body.  Allergic reaction to medicines or dyes.  Bleeding into the abdomen (retroperitoneal bleeding).  Stroke (rare).  Heart attack (rare). What happens before the procedure? Staying hydrated Follow instructions from your  health care provider about hydration, which may include:  Up to 2 hours before the procedure - you may continue to drink clear liquids, such as water, clear fruit juice, black coffee, and plain tea.  Eating and drinking restrictions Follow instructions from your health care provider about eating and drinking, which may include:  8 hours before the procedure - stop eating heavy meals or foods, such as meat, fried foods, or fatty foods.  6 hours before the procedure - stop eating light meals or foods, such as toast or cereal.  2 hours before the procedure - stop drinking clear liquids. Medicines Ask your health care provider about:  Changing or stopping your regular medicines. This is especially important if you are taking diabetes medicines or blood thinners.  Taking medicines such as aspirin and ibuprofen. These medicines can thin your blood. Do not take these medicines unless your health care provider tells you to take them. ? Generally, aspirin is recommended before a thin tube, called a catheter, is passed through a blood vessel and inserted into the  heart (cardiac catheterization).  Taking over-the-counter medicines, vitamins, herbs, and supplements. General instructions  Do not use any products that contain nicotine or tobacco for at least 4 weeks before the procedure. These products include cigarettes, e-cigarettes, and chewing tobacco. If you need help quitting, ask your health care provider.  Plan to have someone take you home from the hospital or clinic.  If you will be going home right after the procedure, plan to have someone with you for 24 hours.  You may have tests and imaging procedures.  Ask your health care provider: ? How your insertion site will be marked. Ask which artery will be used for the procedure. ? What steps will be taken to help prevent infection. These may include:  Removing hair at the insertion site.  Washing skin with a germ-killing  soap.  Taking antibiotic medicine. What happens during the procedure?   An IV will be inserted into one of your veins.  Electrodes may be placed on your chest to monitor your heart rate during the procedure.  You will be given one or more of the following: ? A medicine to help you relax (sedative). ? A medicine to numb the area (local anesthetic) for catheter insertion.  A small incision will be made for catheter insertion.  The catheter will be inserted into an artery using a guide wire. The location may be in your groin, your wrist, or the fold of your arm (near your elbow).  An X-ray procedure (fluoroscopy) will be used to help guide the catheter to the opening of the heart arteries.  A dye will be injected into the catheter. X-rays will be taken. The dye helps to show where any narrowing or blockages are located in the arteries.  Tell your health care provider if you have chest pain or trouble breathing.  A tiny wire will be guided to the blocked spot, and a balloon will be inflated to make the artery wider.  The stent will be expanded to crush the plaques into the wall of the vessel. The stent will hold the area open and improve the blood flow. Most stents have a drug coating to reduce the risk of the stent narrowing over time.  The artery may be made wider using a drill, laser, or other tools that remove plaques.  The catheter will be removed when the blood flow improves. The stent will stay where it was placed, and the lining of the artery will grow over it.  A bandage (dressing) will be placed on the insertion site. Pressure will be applied to stop bleeding.  The IV will be removed. This procedure may vary among health care providers and hospitals. What happens after the procedure?  Your blood pressure, heart rate, breathing rate, and blood oxygen level will be monitored until you leave the hospital or clinic.  If the procedure is done through the leg, you will lie  flat in bed for a few hours or for as long as told by your health care provider. You will be instructed not to bend or cross your legs.  The insertion site and the pulse in your foot or wrist will be checked often.  You may have more blood tests, X-rays, and a test that records the electrical activity of your heart (electrocardiogram, or ECG).  Do not drive for 24 hours if you were given a sedative during your procedure. Summary  Coronary angiogram with stent placement is a procedure to widen or open a narrowed coronary artery.  This is done to treat heart problems.  Before the procedure, let your health care provider know about all the medical conditions and surgeries you have or have had.  This is a safe procedure. However, some problems may occur, including damage to nearby structures or organs, bleeding, blood clots, or allergies.  Follow your health care provider's instructions about eating, drinking, medicines, and other lifestyle changes, such as quitting tobacco use before the procedure. This information is not intended to replace advice given to you by your health care provider. Make sure you discuss any questions you have with your health care provider. Document Revised: 12/18/2018 Document Reviewed: 12/18/2018 Elsevier Patient Education  [place].

## 2020-03-10 NOTE — Progress Notes (Signed)
Follow-up Outpatient Visit Date: 03/10/2020  Primary Care Provider: Jinny Sanders, MD [place] [address]  Chief Complaint: Follow-up abnormal cardiac CTA  HPI:  Miguel Bradley is a 79 y.o. male with history of recently diagnosed multivessel CAD by cardiac CTA, carotid artery stenosis, hypertension, hyperlipidemia, and type 2 diabetes mellitus, who presents for follow-up of abnormal cardiac CTA in the setting of fatigue.  He has been seen twice over the last 2 months by Marrianne Mood, PA, initially for assessment of continued.  Echocardiogram earlier this month showed normal LVEF with mild LVH and grade 1 diastolic dysfunction.  Mild aortic stenosis was also noted.  Subsequent cardiac CTA showed multivessel coronary artery disease including diffuse LAD disease that was not hemodynamically significant by CT FFR in mid and distal vessel, as well as chronic total occlusion of the mid RCA.  Today, Miguel Bradley reports he has been feeling well.  He continues to push mow and play golf on a regular basis without any symptoms.  He has not had chest pain, shortness of breath, palpitations, lightheadedness, or edema.  His only complaint is that it sometimes feels like his feet are numb.  He otherwise denies focal neurologic deficits.  --------------------------------------------------------------------------------------------------  Cardiovascular History & Procedures: Cardiovascular Problems:  Coronary artery disease  Aortic stenosis  Risk Factors:  Known CAD, hypertension, hyperlipidemia, diabetes mellitus, male gender, prior tobacco use, obesity, and age greater than 16  Cath/PCI:  None  CV Surgery:  None  EP Procedures and Devices:  None  Non-Invasive Evaluation(s):  Cardiac CTA (03/04/2020): High coronary artery calcium score (2596): LMCA normal.  LAD with heavy calcification in the proximal segment with 70 to 99% stenosis.  First diagonal branch has 50 to  69% stenosis.  Nondominant LCx is normal.  Dominant RCA has mixed calcified and noncalcified plaque with 70 and 99% stenosis in the mid vessel.  CT FFR shows significant flow reduction in the mid and distal vessel (0.78 in the mid segment and 0.71 in the distal vessel).  There is a total occlusion of the mid RCA.  TTE (02/19/2020): Normal LV size with mild LVH.  Grade 1 diastolic dysfunction.  LVEF 60-65%.  Normal RV size and function.  Mild aortic stenosis (mean gradient 10 mmHg).  Recent CV Pertinent Labs: Lab Results  Component Value Date   CHOL 119 12/26/2019   HDL 31.20 (L) 12/26/2019   LDLCALC 54 11/28/2013   LDLDIRECT 61.0 12/26/2019   TRIG 220.0 (H) 12/26/2019   CHOLHDL 4 12/26/2019   K 3.8 03/10/2020   BUN 17 03/10/2020   BUN 17 02/23/2020   CREATININE 1.31 (H) 03/10/2020    Past medical and surgical history were reviewed and updated in EPIC.  Current Meds  Medication Sig   aspirin EC 81 MG tablet Take 81 mg by mouth daily.   Cholecalciferol (VITAMIN D) 2000 UNITS tablet Take 2,000 Units by mouth daily.     dorzolamide-timolol (COSOPT) 22.3-6.8 MG/ML ophthalmic solution Place 1 drop into both eyes 2 (two) times daily.    fenofibrate 160 MG tablet TAKE 1 TABLET BY MOUTH EVERY DAY   hydrochlorothiazide (HYDRODIURIL) 25 MG tablet TAKE 1 TABLET BY MOUTH EVERY DAY   latanoprost (XALATAN) 0.005 % ophthalmic solution Place 1 drop into both eyes at bedtime.    metFORMIN (GLUCOPHAGE) 500 MG tablet TAKE ONE TABLET BY MOUTH TWICE DAILY WITH A MEAL   metoprolol tartrate (LOPRESSOR) 50 MG tablet Take 1 tablet (50 mg total) by  mouth once for 1 dose. Once time only dose, 2 hours prior to imaging   Omega-3 Fatty Acids (FISH OIL OMEGA-3 PO) Take 1 capsule by mouth daily.   rosuvastatin (CRESTOR) 20 MG tablet TAKE 1 TABLET BY MOUTH EVERY DAY    Allergies: Patient has no known allergies.  Social History   Tobacco Use   Smoking status: Former Smoker    Years: 25.00    Quit  date: 06/12/1978    Years since quitting: 41.7   Smokeless tobacco: Never Used  Scientific laboratory technician Use: Never used  Substance Use Topics   Alcohol use: Yes    Comment: 1 beer every few weeks   Drug use: No    Family History  Problem Relation Age of Onset   Hypertension Mother    Dementia Mother    Diabetes Other        aunt   Hypertension Brother    Hypertension Brother    Hypertension Brother    Hyperlipidemia Brother    Hyperlipidemia Brother    Hyperlipidemia Brother    Hypertension Sister    Hyperlipidemia Sister    Colon cancer Neg Hx    Esophageal cancer Neg Hx    Rectal cancer Neg Hx    Stomach cancer Neg Hx     Review of Systems: A 12-system review of systems was performed and was negative except as noted in the HPI.  --------------------------------------------------------------------------------------------------  Physical Exam: BP 120/64 (BP Location: Left Arm, Patient Position: Sitting, Cuff Size: Normal)    Pulse 60    Ht 5\' 10"  (1.778 m)    Wt 213 lb 6 oz (96.8 kg)    SpO2 96%    BMI 30.62 kg/m   General: NAD.  Accompanied by his girlfriend. HEENT: No conjunctival pallor or scleral icterus. Facemask in place. Neck: No JVD or HJR. Lungs: Normal work of breathing. Clear to auscultation bilaterally without wheezes or crackles. Heart: Regular rate and rhythm with 2/6 systolic murmur.  No rubs or gallops. Abd: Bowel sounds present. Soft, NT/ND without hepatosplenomegaly Ext: No lower extremity edema. Radial, PT, and DP pulses are 2+ bilaterally. Skin: Warm and dry without rash.  EKG: Normal sinus rhythm with right axis deviation and nonspecific intraventricular conduction delay.  No significant change from prior tracing on 02/23/2020.  Lab Results  Component Value Date   WBC 8.8 03/10/2020   HGB 16.1 03/10/2020   HCT 46.3 03/10/2020   MCV 90.6 03/10/2020   PLT 271 03/10/2020    Lab Results  Component Value Date   NA 139  03/10/2020   K 3.8 03/10/2020   CL 104 03/10/2020   CO2 24 03/10/2020   BUN 17 03/10/2020   CREATININE 1.31 (H) 03/10/2020   GLUCOSE 108 (H) 03/10/2020   ALT 13 12/26/2019    Lab Results  Component Value Date   CHOL 119 12/26/2019   HDL 31.20 (L) 12/26/2019   LDLCALC 54 11/28/2013   LDLDIRECT 61.0 12/26/2019   TRIG 220.0 (H) 12/26/2019   CHOLHDL 4 12/26/2019    --------------------------------------------------------------------------------------------------  ASSESSMENT AND PLAN: Coronary artery disease without angina: Miguel Bradley remains asymptomatic despite recent cardiac CTA demonstrating significant two-vessel coronary artery disease (CT FFR positive diffuse LAD disease as well as chronic total occlusion of the RCA).  Although he does not have symptoms, we discussed the importance of better delineation of his coronary artery disease as he may be having silent ischemia with his history of diabetes mellitus.  If he has  significant to her three-vessel CAD, there may be a mortality benefit associated with revascularization.  I have recommended that we proceed with cardiac catheterization and possible PCI, which Miguel Bradley is in agreement with.  I have reviewed the risks, indications, and alternatives to cardiac catheterization, possible angioplasty, and stenting with the patient. Risks include but are not limited to bleeding, infection, vascular injury, stroke, myocardial infection, arrhythmia, kidney injury, radiation-related injury in the case of prolonged fluoroscopy use, emergency cardiac surgery, and death. The patient understands the risks of serious complication is 1-2 in 9390 with diagnostic cardiac cath and 1-2% or less with angioplasty/stenting.  In the meantime, he should continue aspirin 81 mg daily and rosuvastatin 20 mg daily.  Carotid artery stenosis: Other than bilateral foot numbness most consistent with diabetic neuropathy, Miguel Bradley denies neurologic changes in spite of  known occlusion of the left carotid artery.  Continue aspirin and statin therapy as well as ongoing follow-up with vascular surgery.  Type 2 diabetes mellitus: Hemoglobin A1c well controlled.  Continue Metformin with ongoing follow-up per Dr. Diona Browner.  Hyperlipidemia: LDL well controlled.  Triglycerides remain somewhat elevated despite being on fenofibrate.  We will continue rosuvastatin and fenofibrate, though I would have a low threshold for discontinuation of fenofibrate in the future and further escalation of rosuvastatin.  Addition of Vascepa could also be considered in lieu of fenofibrate in the future if triglycerides remain elevated.  Aortic stenosis: Noted on recent echocardiogram and found to be mild.  No symptoms at this time.  Continue clinical follow-up with plan for repeat echocardiogram in 2 to 3 years, sooner if symptoms develop.  Hypertension: Blood pressure controlled.  No medication changes at this time.  Follow-up: To be determined based on results of catheterization.  Nelva Bush, MD 03/11/2020 11:39 AM

## 2020-03-11 ENCOUNTER — Encounter: Payer: Self-pay | Admitting: Internal Medicine

## 2020-03-11 DIAGNOSIS — I2510 Atherosclerotic heart disease of native coronary artery without angina pectoris: Secondary | ICD-10-CM | POA: Insufficient documentation

## 2020-03-11 DIAGNOSIS — I350 Nonrheumatic aortic (valve) stenosis: Secondary | ICD-10-CM | POA: Insufficient documentation

## 2020-03-12 ENCOUNTER — Other Ambulatory Visit
Admission: RE | Admit: 2020-03-12 | Discharge: 2020-03-12 | Disposition: A | Discharge status: Home / Self Care | Payer: PPO | Source: Ambulatory Visit | Attending: Internal Medicine | Admitting: Internal Medicine

## 2020-03-12 DIAGNOSIS — Z01812 Encounter for preprocedural laboratory examination: Secondary | ICD-10-CM | POA: Insufficient documentation

## 2020-03-12 DIAGNOSIS — Z20822 Contact with and (suspected) exposure to covid-19: Secondary | ICD-10-CM | POA: Diagnosis not present

## 2020-03-13 LAB — SARS CORONAVIRUS 2 (TAT 6-24 HRS): SARS Coronavirus 2: NEGATIVE

## 2020-03-16 ENCOUNTER — Other Ambulatory Visit: Payer: Self-pay

## 2020-03-16 ENCOUNTER — Encounter
Admission: RE | Disposition: A | Discharge status: Home / Self Care | Payer: Self-pay | Source: Ambulatory Visit | Attending: Internal Medicine

## 2020-03-16 ENCOUNTER — Encounter: Payer: Self-pay | Admitting: Internal Medicine

## 2020-03-16 ENCOUNTER — Ambulatory Visit
Admission: RE | Admit: 2020-03-16 | Discharge: 2020-03-16 | Disposition: A | Discharge status: Home / Self Care | Payer: PPO | Source: Ambulatory Visit | Attending: Internal Medicine | Admitting: Internal Medicine

## 2020-03-16 DIAGNOSIS — I10 Essential (primary) hypertension: Secondary | ICD-10-CM | POA: Diagnosis not present

## 2020-03-16 DIAGNOSIS — I2510 Atherosclerotic heart disease of native coronary artery without angina pectoris: Secondary | ICD-10-CM | POA: Diagnosis not present

## 2020-03-16 DIAGNOSIS — Z87891 Personal history of nicotine dependence: Secondary | ICD-10-CM | POA: Insufficient documentation

## 2020-03-16 DIAGNOSIS — R931 Abnormal findings on diagnostic imaging of heart and coronary circulation: Secondary | ICD-10-CM | POA: Diagnosis not present

## 2020-03-16 DIAGNOSIS — Z7984 Long term (current) use of oral hypoglycemic drugs: Secondary | ICD-10-CM | POA: Diagnosis not present

## 2020-03-16 DIAGNOSIS — E119 Type 2 diabetes mellitus without complications: Secondary | ICD-10-CM | POA: Insufficient documentation

## 2020-03-16 DIAGNOSIS — Z7982 Long term (current) use of aspirin: Secondary | ICD-10-CM | POA: Diagnosis not present

## 2020-03-16 DIAGNOSIS — Z79899 Other long term (current) drug therapy: Secondary | ICD-10-CM | POA: Diagnosis not present

## 2020-03-16 DIAGNOSIS — I350 Nonrheumatic aortic (valve) stenosis: Secondary | ICD-10-CM | POA: Diagnosis not present

## 2020-03-16 DIAGNOSIS — I6529 Occlusion and stenosis of unspecified carotid artery: Secondary | ICD-10-CM | POA: Insufficient documentation

## 2020-03-16 DIAGNOSIS — E785 Hyperlipidemia, unspecified: Secondary | ICD-10-CM | POA: Insufficient documentation

## 2020-03-16 DIAGNOSIS — R9439 Abnormal result of other cardiovascular function study: Secondary | ICD-10-CM

## 2020-03-16 HISTORY — PX: LEFT HEART CATH AND CORONARY ANGIOGRAPHY: CATH118249

## 2020-03-16 LAB — GLUCOSE, CAPILLARY: Glucose-Capillary: 128 mg/dL — ABNORMAL HIGH (ref 70–99)

## 2020-03-16 SURGERY — LEFT HEART CATH AND CORONARY ANGIOGRAPHY
Anesthesia: Moderate Sedation | Laterality: Left

## 2020-03-16 MED ORDER — FENTANYL CITRATE (PF) 100 MCG/2ML IJ SOLN
INTRAMUSCULAR | Status: DC | PRN
Start: 2020-03-16 — End: 2020-03-16
  Administered 2020-03-16: 25 ug via INTRAVENOUS

## 2020-03-16 MED ORDER — HEPARIN SODIUM (PORCINE) 1000 UNIT/ML IJ SOLN
INTRAMUSCULAR | Status: AC
  Filled 2020-03-16: qty 1

## 2020-03-16 MED ORDER — SODIUM CHLORIDE 0.9% FLUSH: 3.0000 mL | INTRAVENOUS | Status: DC | PRN

## 2020-03-16 MED ORDER — HYDRALAZINE HCL 20 MG/ML IJ SOLN: 10.0000 mg | INTRAMUSCULAR | Status: DC | PRN

## 2020-03-16 MED ORDER — ACETAMINOPHEN 325 MG PO TABS: 650.0000 mg | ORAL_TABLET | ORAL | Status: DC | PRN

## 2020-03-16 MED ORDER — METFORMIN HCL 500 MG PO TABS
500.0000 mg | ORAL_TABLET | Freq: Two times a day (BID) | ORAL | 1 refills | Status: DC
Start: 2020-03-19 — End: 2020-07-06

## 2020-03-16 MED ORDER — ONDANSETRON HCL 4 MG/2ML IJ SOLN: 4.0000 mg | Freq: Four times a day (QID) | INTRAMUSCULAR | Status: DC | PRN

## 2020-03-16 MED ORDER — VERAPAMIL HCL 2.5 MG/ML IV SOLN
INTRAVENOUS | Status: AC
  Filled 2020-03-16: qty 2

## 2020-03-16 MED ORDER — HEPARIN (PORCINE) IN NACL 1000-0.9 UT/500ML-% IV SOLN
INTRAVENOUS | Status: AC
  Filled 2020-03-16: qty 1000

## 2020-03-16 MED ORDER — ASPIRIN 81 MG PO CHEW: 81.0000 mg | CHEWABLE_TABLET | ORAL | Status: DC

## 2020-03-16 MED ORDER — VERAPAMIL HCL 2.5 MG/ML IV SOLN
INTRAVENOUS | Status: DC | PRN
  Administered 2020-03-16: 2.5 mg via INTRA_ARTERIAL

## 2020-03-16 MED ORDER — SODIUM CHLORIDE 0.9% FLUSH: 3.0000 mL | Freq: Two times a day (BID) | INTRAVENOUS | Status: DC

## 2020-03-16 MED ORDER — MIDAZOLAM HCL 2 MG/2ML IJ SOLN
INTRAMUSCULAR | Status: DC | PRN
  Administered 2020-03-16: 1 mg via INTRAVENOUS

## 2020-03-16 MED ORDER — IOHEXOL 300 MG/ML  SOLN
INTRAMUSCULAR | Status: DC | PRN
  Administered 2020-03-16: 40 mL

## 2020-03-16 MED ORDER — FENTANYL CITRATE (PF) 100 MCG/2ML IJ SOLN
INTRAMUSCULAR | Status: AC
  Filled 2020-03-16: qty 2

## 2020-03-16 MED ORDER — SODIUM CHLORIDE 0.9 % IV SOLN: 250.0000 mL | INTRAVENOUS | Status: DC | PRN

## 2020-03-16 MED ORDER — MIDAZOLAM HCL 2 MG/2ML IJ SOLN
INTRAMUSCULAR | Status: AC
  Filled 2020-03-16: qty 2

## 2020-03-16 MED ORDER — HEPARIN SODIUM (PORCINE) 1000 UNIT/ML IJ SOLN
INTRAMUSCULAR | Status: DC | PRN
  Administered 2020-03-16: 5000 [IU] via INTRAVENOUS

## 2020-03-16 MED ORDER — SODIUM CHLORIDE 0.9 % WEIGHT BASED INFUSION: 1.0000 mL/kg/h | INTRAVENOUS | Status: DC

## 2020-03-16 MED ORDER — SODIUM CHLORIDE 0.9 % WEIGHT BASED INFUSION
3.0000 mL/kg/h | INTRAVENOUS | Status: AC
  Administered 2020-03-16: 3 mL/kg/h via INTRAVENOUS

## 2020-03-16 MED ORDER — NITROGLYCERIN 0.4 MG SL SUBL: 0.4000 mg | SUBLINGUAL_TABLET | SUBLINGUAL | 99 refills | Status: DC | PRN

## 2020-03-16 MED ORDER — LABETALOL HCL 5 MG/ML IV SOLN: 10.0000 mg | INTRAVENOUS | Status: DC | PRN

## 2020-03-16 MED ORDER — HEPARIN (PORCINE) IN NACL 1000-0.9 UT/500ML-% IV SOLN
INTRAVENOUS | Status: DC | PRN
  Administered 2020-03-16: 500 mL

## 2020-03-16 MED ORDER — SODIUM CHLORIDE 0.9 % IV SOLN: INTRAVENOUS | Status: DC

## 2020-03-16 SURGICAL SUPPLY — 9 items
CATH INFINITI 5FR JL4 (CATHETERS) ×1 IMPLANT
CATH INFINITI JR4 5F (CATHETERS) ×1 IMPLANT
DEVICE RAD TR BAND REGULAR (VASCULAR PRODUCTS) ×1 IMPLANT
GLIDESHEATH SLEND SS 6F .021 (SHEATH) ×1 IMPLANT
KIT MANI 3VAL PERCEP (MISCELLANEOUS) ×2 IMPLANT
PACK CARDIAC CATH (CUSTOM PROCEDURE TRAY) ×2 IMPLANT
WIRE EMERALD ST .035X150CM (WIRE) ×1 IMPLANT
WIRE HITORQ VERSACORE ST 145CM (WIRE) ×1 IMPLANT
WIRE ROSEN-J .035X260CM (WIRE) ×1 IMPLANT

## 2020-03-16 NOTE — Interval H&P Note (Signed)
History and Physical Interval Note:  03/16/2020 9:34 AM  Miguel Bradley  has presented today for surgery, with the diagnosis of coronary artery disease and abnormal cardiac CTA.  The various methods of treatment have been discussed with the patient and family. After consideration of risks, benefits and other options for treatment, the patient has consented to  Procedure(s): LEFT HEART CATH AND CORONARY ANGIOGRAPHY (Left) as a surgical intervention.  The patient's history has been reviewed, patient examined, no change in status, stable for surgery.  I have reviewed the patient's chart and labs.  Questions were answered to the patient's satisfaction.    Cath Lab Visit (complete for each Cath Lab visit)  Clinical Evaluation Leading to the Procedure:   ACS: No.  Non-ACS:    Anginal Classification: CCS I  Anti-ischemic medical therapy: No Therapy  Non-Invasive Test Results: High-risk stress test findings: cardiac mortality >3%/year (cardiac CTA with significant LAD and RCA disease)  Prior CABG: No previous CABG  Madhavi Hamblen

## 2020-03-19 NOTE — Progress Notes (Signed)
Follow-up Outpatient Visit Date: 03/22/2020  Primary Care Provider: Jinny Sanders, MD [place] [address]  Chief Complaint: Follow-up coronary artery disease  HPI:  Miguel Bradley is a 79 y.o. male with history of coronary artery disease, left carotid artery occlusion, hypertension, hyperlipidemia, and type 2 diabetes mellitus, who presents for follow-up of coronary artery disease.  I last saw him in late September after preceding cardiac CTA showed multivessel CAD, including FFR positive LAD disease and chronic total occlusion of the mid RCA.  He underwent cardiac catheterization earlier this month that showed 80 to 90% distal RCA stenosis and nonobstructive disease in the left coronary artery.  Given that he was totally asymptomatic, we opted to continue with medical therapy.  Today, [name] reports that he is feeling well without chest pain, shortness of breath, palpitations, lightheadedness, or edema.  He has mild soreness and bruising of the left wrist at the catheterization site.  --------------------------------------------------------------------------------------------------  Cardiovascular History & Procedures: Cardiovascular Problems:  Coronary artery disease  Aortic stenosis  Risk Factors:  Known CAD, hypertension, hyperlipidemia, diabetes mellitus, male gender, prior tobacco use, obesity, and age greater than 74  Cath/PCI:  LHC/PCI (03/16/2020): See normal.  LAD with 30% proximal/mid vessel disease.  Small second diagonal branch has 80% stenosis.  Minimal luminal irregularities noted in ramus intermedius.  LCx normal.  Dominant RCA with 80-90% distal stenosis.  Mildly elevated left ventricular filling pressure (LVEDP 15-20 mmHg).  Mild aortic stenosis (peak to peak gradient 14 mmHg).  CV Surgery:  None  EP Procedures and Devices:  None  Non-Invasive Evaluation(s):  Cardiac CTA (03/04/2020): High coronary artery calcium score (2596):  LMCA normal.  LAD with heavy calcification in the proximal segment with 70 to 99% stenosis.  First diagonal branch has 50 to 69% stenosis.  Nondominant LCx is normal.  Dominant RCA has mixed calcified and noncalcified plaque with 70 and 99% stenosis in the mid vessel.  CT FFR shows significant flow reduction in the mid and distal vessel (0.78 in the mid segment and 0.71 in the distal vessel).  There is a total occlusion of the mid RCA.  TTE (02/19/2020): Normal LV size with mild LVH.  Grade 1 diastolic dysfunction.  LVEF 60-65%.  Normal RV size and function.  Mild aortic stenosis (mean gradient 10 mmHg).  Recent CV Pertinent Labs: Lab Results  Component Value Date   CHOL 119 12/26/2019   HDL 31.20 (L) 12/26/2019   LDLCALC 54 11/28/2013   LDLDIRECT 61.0 12/26/2019   TRIG 220.0 (H) 12/26/2019   CHOLHDL 4 12/26/2019   K 3.8 03/10/2020   BUN 17 03/10/2020   BUN 17 02/23/2020   CREATININE 1.31 (H) 03/10/2020    Past medical and surgical history were reviewed and updated in EPIC.  Current Meds  Medication Sig  . aspirin EC 81 MG tablet Take 81 mg by mouth daily.  . Cholecalciferol (VITAMIN D) 2000 UNITS tablet Take 2,000 Units by mouth daily.    . diphenhydramine-acetaminophen (TYLENOL PM) 25-500 MG TABS tablet Take 2 tablets by mouth at bedtime.  . dorzolamide-timolol (COSOPT) 22.3-6.8 MG/ML ophthalmic solution Place 1 drop into both eyes 2 (two) times daily.   . fenofibrate 160 MG tablet TAKE 1 TABLET BY MOUTH EVERY DAY (Patient taking differently: Take 160 mg by mouth daily. )  . hydrochlorothiazide (HYDRODIURIL) 25 MG tablet TAKE 1 TABLET BY MOUTH EVERY DAY (Patient taking differently: Take 25 mg by mouth daily. )  . latanoprost (XALATAN)  0.005 % ophthalmic solution Place 1 drop into both eyes at bedtime.   . metFORMIN (GLUCOPHAGE) 500 MG tablet Take 1 tablet (500 mg total) by mouth 2 (two) times daily with a meal.  . nitroGLYCERIN (NITROSTAT) 0.4 MG SL tablet Place 1 tablet (0.4 mg total)  under the tongue every 5 (five) minutes as needed for chest pain.  . Omega-3 Fatty Acids (FISH OIL OMEGA-3 PO) Take 1 capsule by mouth daily.  . rosuvastatin (CRESTOR) 20 MG tablet TAKE 1 TABLET BY MOUTH EVERY DAY (Patient taking differently: Take 20 mg by mouth daily. )    Allergies: Patient has no known allergies.  Social History   Tobacco Use  . Smoking status: Former Smoker    Years: 25.00    Quit date: 06/12/1978    Years since quitting: 41.8  . Smokeless tobacco: Never Used  Vaping Use  . Vaping Use: Never used  Substance Use Topics  . Alcohol use: Yes    Comment: 1 beer every few weeks  . Drug use: No    Family History  Problem Relation Age of Onset  . Hypertension Mother   . Dementia Mother   . Diabetes Other        aunt  . Hypertension Brother   . Hypertension Brother   . Hypertension Brother   . Hyperlipidemia Brother   . Hyperlipidemia Brother   . Hyperlipidemia Brother   . Hypertension Sister   . Hyperlipidemia Sister   . Colon cancer Neg Hx   . Esophageal cancer Neg Hx   . Rectal cancer Neg Hx   . Stomach cancer Neg Hx     Review of Systems: A 12-system review of systems was performed and was negative except as noted in the HPI.  --------------------------------------------------------------------------------------------------  Physical Exam: BP 110/60   Pulse (!) 49   Ht 5\' 10"  (1.778 m)   Wt 215 lb 12.8 oz (97.9 kg)   BMI 30.96 kg/m   General: NAD. Neck: No JVD or HJR. Lungs: Clear to auscultation bilaterally without wheezes or crackles. Heart: Bradycardic but regular with 2/6 systolic murmur.  No rubs or gallops. Abdomen: Soft, nontender, nondistended. Extremities: No lower extremity edema.  2+ radial pulses bilaterally.  Mild bruising noted just proximal to the left radial arteriotomy site.  No bruit or hematoma noted.  EKG: Sinus bradycardia with left axis deviation, nonspecific T wave abnormality, and nonspecific intraventricular  conduction delay.  Compared with prior tracing on 03/10/2020, axis has shifted to the left (question lead placement)  Lab Results  Component Value Date   WBC 8.8 03/10/2020   HGB 16.1 03/10/2020   HCT 46.3 03/10/2020   MCV 90.6 03/10/2020   PLT 271 03/10/2020    Lab Results  Component Value Date   NA 139 03/10/2020   K 3.8 03/10/2020   CL 104 03/10/2020   CO2 24 03/10/2020   BUN 17 03/10/2020   CREATININE 1.31 (H) 03/10/2020   GLUCOSE 108 (H) 03/10/2020   ALT 13 12/26/2019    Lab Results  Component Value Date   CHOL 119 12/26/2019   HDL 31.20 (L) 12/26/2019   LDLCALC 54 11/28/2013   LDLDIRECT 61.0 12/26/2019   TRIG 220.0 (H) 12/26/2019   CHOLHDL 4 12/26/2019    --------------------------------------------------------------------------------------------------  ASSESSMENT AND PLAN: Coronary artery disease: Miguel Bradley continues to do well without angina.  He was noted to have an 80-90% distal RCA stenosis by catheterization the last week.  Nonobstructive left coronary artery disease was noted.  Given that he has been asymptomatic and is a diabetic, I have recommended a functional study to assess for silent ischemia.  As his EKG shows nonspecific intraventricular conduction delay with a left bundle-like morphology, I have recommended that we perform a pharmacologic myocardial perfusion stress test for further assessment.  In the meantime, we will continue aspirin and statin therapy for secondary prevention.  If the stress test is not high risk, I think it would be reasonable to continue with medical therapy in the setting of CCS class I symptoms and single-vessel coronary artery disease.  Aortic stenosis: Mild aortic stenosis noted on recent echo.  Continue clinical follow-up with plans to repeat echocardiogram in 2 to 3 years, sooner if symptoms develop.  Carotid artery occlusion: Left carotid artery known to be occluded.  Continue aspirin and statin therapy, with ongoing  follow-up through vascular surgery.  Hyperlipidemia: LDL controlled.  Continue rosuvastatin.  Hypertension: Blood pressure adequately controlled today.  Continue current medications.  Follow-up: Return to clinic in 3 months.  Nelva Bush, MD 03/22/2020 9:41 AM

## 2020-03-22 ENCOUNTER — Ambulatory Visit: Payer: PPO | Admitting: Internal Medicine

## 2020-03-22 ENCOUNTER — Other Ambulatory Visit: Payer: Self-pay

## 2020-03-22 ENCOUNTER — Encounter: Payer: Self-pay | Admitting: Internal Medicine

## 2020-03-22 VITALS — BP 110/60 | HR 49 | Ht 70.0 in | Wt 215.8 lb

## 2020-03-22 DIAGNOSIS — I10 Essential (primary) hypertension: Secondary | ICD-10-CM

## 2020-03-22 DIAGNOSIS — E785 Hyperlipidemia, unspecified: Secondary | ICD-10-CM | POA: Diagnosis not present

## 2020-03-22 DIAGNOSIS — I6522 Occlusion and stenosis of left carotid artery: Secondary | ICD-10-CM | POA: Diagnosis not present

## 2020-03-22 DIAGNOSIS — I2510 Atherosclerotic heart disease of native coronary artery without angina pectoris: Secondary | ICD-10-CM | POA: Diagnosis not present

## 2020-03-22 DIAGNOSIS — I350 Nonrheumatic aortic (valve) stenosis: Secondary | ICD-10-CM

## 2020-03-22 NOTE — Patient Instructions (Addendum)
Medication Instructions:  Your physician recommends that you continue on your current medications as directed. Please refer to the Current Medication list given to you today.  *If you need a refill on your cardiac medications before your next appointment, please call your pharmacy*   Lab Work: none If you have labs (blood work) drawn today and your tests are completely normal, you will receive your results only by: Marland Kitchen MyChart Message (if you have MyChart) OR . A paper copy in the mail If you have any lab test that is abnormal or we need to change your treatment, we will call you to review the results.   Testing/Procedures: Your physician has requested that you have a lexiscan myoview. For further information please visit [email or web address]. Please follow instruction sheet, as given.  [place]  Your caregiver has ordered a Stress Test with nuclear imaging. The purpose of this test is to evaluate the blood supply to your heart muscle. This procedure is referred to as a "Non-Invasive Stress Test." This is because other than having an IV started in your vein, nothing is inserted or "invades" your body. Cardiac stress tests are done to find areas of poor blood flow to the heart by determining the extent of coronary artery disease (CAD). Some patients exercise on a treadmill, which naturally increases the blood flow to your heart, while others who are  unable to walk on a treadmill due to physical limitations have a pharmacologic/chemical stress agent called Lexiscan . This medicine will mimic walking on a treadmill by temporarily increasing your coronary blood flow.   Please note: these test may take anywhere between 2-4 hours to complete  PLEASE REPORT TO [facility] MEDICAL MALL ENTRANCE  THE VOLUNTEERS AT THE FIRST DESK WILL DIRECT YOU WHERE TO GO  Date of Procedure:_____________________________________  Arrival Time for Procedure:______________________________  Instructions regarding  medication:   _xx_ : Hold diabetes medication morning of procedure - Metformin  _xx_:  Hold other medications as follows:____________Hydrochlorothiazide______  PLEASE NOTIFY THE OFFICE AT LEAST 24 HOURS IN ADVANCE IF YOU ARE UNABLE TO KEEP YOUR APPOINTMENT.  [phone] AND  PLEASE NOTIFY NUCLEAR MEDICINE AT [facility] AT LEAST 24 HOURS IN ADVANCE IF YOU ARE UNABLE TO KEEP YOUR APPOINTMENT. [phone]  How to prepare for your Myoview test:  1. Do not eat or drink after midnight 2. No caffeine for 24 hours prior to test 3. No smoking 24 hours prior to test. 4. Your medication may be taken with water.  If your doctor stopped a medication because of this test, do not take that medication. 5. Please wear a short sleeve shirt. 6. No perfume, cologne or lotion. 7. Wear comfortable walking shoes.     Follow-Up: At [facility], you and your health needs are our priority.  As part of our continuing mission to provide you with exceptional heart care, we have created designated Provider Care Teams.  These Care Teams include your primary Cardiologist (physician) and Advanced Practice Providers (APPs -  Physician Assistants and Nurse Practitioners) who all work together to provide you with the care you need, when you need it.  We recommend signing up for the patient portal called "MyChart".  Sign up information is provided on this After Visit Summary.  MyChart is used to connect with patients for Virtual Visits (Telemedicine).  Patients are able to view lab/test results, encounter notes, upcoming appointments, etc.  Non-urgent messages can be sent to your provider as well.   To learn more about what you can do  with MyChart, go to [email or web address].    Your next appointment:   3 month(s)  The format for your next appointment:   In Person  Provider:   You may see DR Harrell Gave END or one of the following Advanced Practice Providers on your designated Care Team:    Murray Hodgkins,  NP  Christell Faith, PA-C  Marrianne Mood, PA-C  Cadence Kathlen Mody, [place]     Cardiac Nuclear Scan A cardiac nuclear scan is a test that measures blood flow to the heart when a person is resting and when he or she is exercising. The test looks for problems such as:  Not enough blood reaching a portion of the heart.  The heart muscle not working normally. You may need this test if:  You have heart disease.  You have had abnormal lab results.  You have had heart surgery or a balloon procedure to open up blocked arteries (angioplasty).  You have chest pain.  You have shortness of breath. In this test, a radioactive dye (tracer) is injected into your bloodstream. After the tracer has traveled to your heart, an imaging device is used to measure how much of the tracer is absorbed by or distributed to various areas of your heart. This procedure is usually done at a hospital and takes 2-4 hours. Tell a health care provider about:  Any allergies you have.  All medicines you are taking, including vitamins, herbs, eye drops, creams, and over-the-counter medicines.  Any problems you or family members have had with anesthetic medicines.  Any blood disorders you have.  Any surgeries you have had.  Any medical conditions you have.  Whether you are pregnant or may be pregnant. What are the risks? Generally, this is a safe procedure. However, problems may occur, including:  Serious chest pain and heart attack. This is only a risk if the stress portion of the test is done.  Rapid heartbeat.  Sensation of warmth in your chest. This usually passes quickly.  Allergic reaction to the tracer. What happens before the procedure?  Ask your health care provider about changing or stopping your regular medicines. This is especially important if you are taking diabetes medicines or blood thinners.  Follow instructions from your health care provider about eating or drinking restrictions.  Remove  your jewelry on the day of the procedure. What happens during the procedure?  An IV will be inserted into one of your veins.  Your health care provider will inject a small amount of radioactive tracer through the IV.  You will wait for 20-40 minutes while the tracer travels through your bloodstream.  Your heart activity will be monitored with an electrocardiogram (ECG).  You will lie down on an exam table.  Images of your heart will be taken for about 15-20 minutes.  You may also have a stress test. For this test, one of the following may be done: ? You will exercise on a treadmill or stationary bike. While you exercise, your heart's activity will be monitored with an ECG, and your blood pressure will be checked. ? You will be given medicines that will increase blood flow to parts of your heart. This is done if you are unable to exercise.  When blood flow to your heart has peaked, a tracer will again be injected through the IV.  After 20-40 minutes, you will get back on the exam table and have more images taken of your heart.  Depending on the type of tracer used, scans may need  to be repeated 3-4 hours later.  Your IV line will be removed when the procedure is over. The procedure may vary among health care providers and hospitals. What happens after the procedure?  Unless your health care provider tells you otherwise, you may return to your normal schedule, including diet, activities, and medicines.  Unless your health care provider tells you otherwise, you may increase your fluid intake. This will help to flush the contrast dye from your body. Drink enough fluid to keep your urine pale yellow.  Ask your health care provider, or the department that is doing the test: ? When will my results be ready? ? How will I get my results? Summary  A cardiac nuclear scan measures the blood flow to the heart when a person is resting and when he or she is exercising.  Tell your health care  provider if you are pregnant.  Before the procedure, ask your health care provider about changing or stopping your regular medicines. This is especially important if you are taking diabetes medicines or blood thinners.  After the procedure, unless your health care provider tells you otherwise, increase your fluid intake. This will help flush the contrast dye from your body.  After the procedure, unless your health care provider tells you otherwise, you may return to your normal schedule, including diet, activities, and medicines. This information is not intended to replace advice given to you by your health care provider. Make sure you discuss any questions you have with your health care provider. Document Revised: 11/12/2017 Document Reviewed: 11/12/2017 Elsevier Patient Education  [place].

## 2020-03-30 DIAGNOSIS — H40153 Residual stage of open-angle glaucoma, bilateral: Secondary | ICD-10-CM | POA: Diagnosis not present

## 2020-04-05 ENCOUNTER — Encounter
Admission: RE | Admit: 2020-04-05 | Discharge: 2020-04-05 | Disposition: A | Discharge status: Home / Self Care | Payer: PPO | Source: Ambulatory Visit | Attending: Internal Medicine | Admitting: Internal Medicine

## 2020-04-05 ENCOUNTER — Other Ambulatory Visit: Payer: Self-pay

## 2020-04-05 DIAGNOSIS — I2510 Atherosclerotic heart disease of native coronary artery without angina pectoris: Secondary | ICD-10-CM | POA: Diagnosis not present

## 2020-04-05 LAB — NM MYOCAR MULTI W/SPECT W/WALL MOTION / EF
LV dias vol: 95 mL (ref 62–150)
LV sys vol: 34 mL
Peak HR: 72 {beats}/min
Percent HR: 50 %
Rest HR: 55 {beats}/min
SDS: 0
SRS: 8
SSS: 0
TID: 0.97

## 2020-04-05 MED ORDER — TECHNETIUM TC 99M TETROFOSMIN IV KIT
30.0000 | PACK | Freq: Once | INTRAVENOUS | Status: AC | PRN
  Administered 2020-04-05: 29.87 via INTRAVENOUS

## 2020-04-05 MED ORDER — TECHNETIUM TC 99M TETROFOSMIN IV KIT
10.0000 | PACK | Freq: Once | INTRAVENOUS | Status: AC | PRN
  Administered 2020-04-05: 9.273 via INTRAVENOUS

## 2020-04-05 MED ORDER — REGADENOSON 0.4 MG/5ML IV SOLN
0.4000 mg | Freq: Once | INTRAVENOUS | Status: AC
  Administered 2020-04-05: 0.4 mg via INTRAVENOUS

## 2020-05-14 DIAGNOSIS — D225 Melanocytic nevi of trunk: Secondary | ICD-10-CM | POA: Diagnosis not present

## 2020-05-14 DIAGNOSIS — L02821 Furuncle of head [any part, except face]: Secondary | ICD-10-CM | POA: Diagnosis not present

## 2020-05-14 DIAGNOSIS — L821 Other seborrheic keratosis: Secondary | ICD-10-CM | POA: Diagnosis not present

## 2020-05-14 DIAGNOSIS — D2262 Melanocytic nevi of left upper limb, including shoulder: Secondary | ICD-10-CM | POA: Diagnosis not present

## 2020-05-14 DIAGNOSIS — Z85828 Personal history of other malignant neoplasm of skin: Secondary | ICD-10-CM | POA: Diagnosis not present

## 2020-05-14 DIAGNOSIS — Z08 Encounter for follow-up examination after completed treatment for malignant neoplasm: Secondary | ICD-10-CM | POA: Diagnosis not present

## 2020-05-19 ENCOUNTER — Telehealth: Payer: Self-pay | Admitting: Family Medicine

## 2020-05-19 NOTE — Telephone Encounter (Signed)
LVM for pt to rtn my call to r/s appt with [facility] on 07/01/20

## 2020-06-20 NOTE — Progress Notes (Signed)
Office Visit    Patient Name: Miguel Bradley Date of Encounter: 06/21/2020  Primary Care Provider:  Jinny Sanders, MD Primary Cardiologist: Dr. Saunders Bradley  Chief Complaint    Chief Complaint  Patient presents with   Follow-up    3 month; discuss lexiscan myoview. "doing well."     80 year old patient with history of CAD, hypertension, hyperlipidemia, carotid artery dz, HLD with goal LDL <70, DM2 with ophthalmic and neuropathy complication, and here for follow-up after recent cardiac CT, subsequent cath, and then MPI to exclude silent ischemia.  Past Medical History    Past Medical History:  Diagnosis Date   Anemia    Basal cell carcinoma of face    Cataract    Diabetes mellitus without complication (HCC)    Glaucoma    Hypercalcemia    Hyperparathyroidism, unspecified ([place])    Impotence of organic origin    Other testicular hypofunction    Pure hyperglyceridemia    Special screening for malignant neoplasm of prostate    Special screening for malignant neoplasms, colon    Unspecified essential hypertension    Past Surgical History:  Procedure Laterality Date   COLONOSCOPY  06/13/2018   Dr. Lucio Bradley   LEFT HEART CATH AND CORONARY ANGIOGRAPHY Left 03/16/2020   Procedure: LEFT HEART CATH AND CORONARY ANGIOGRAPHY;  Surgeon: Miguel Bush, MD;  Location: [place] [facility];  Service: Cardiovascular;  Laterality: Left;   PARATHYROIDECTOMY  2012   skin cancer removal     Dr. Sharlett Bradley   TRIGGER FINGER RELEASE Left 2008    Allergies  No Known Allergies  History of Present Illness    Miguel Bradley is a 80 y.o. male with PMH as above. Per review of his PCP progress notes, he is suspected to be at high risk for CAD given new diagnosis of carotid disease with comorbid DM2, hyperlipidemia, and hypertension.  He was thus referred to cardiology for possible stress testing evaluation due to decreased mobility and endurance. Prior to establishing with  [facility] cardiology, he had no previous history of heart dz or arrhythmia. He denied a family history of sudden cardiac death at an early age. He quit smoking in 1980, at which time he smoked 3/4 a pack of filtered cigarettes daily. He currently drinks 1 beer once a month on average. He has a history of diabetes with retinopathy (followed by ophthalmology) and bilateral neuropathy.   He was previously evaluated by Miguel Bradley for chronic L internal carotid artery occlusion and noted to be asymptomatic with no benefit for carotid endarterectomy. Recommendation was for risk factor modification and medical therapy to prevent progression of the carotid dz on the contralateral side. He was continued on ASA and fenofibrate.    On 01/20/20, he was seen in clinic after referral to [facility] cardiology for abnormal EKG and risk factors for cardiac disease and denied anginal symptoms.  He denied regular workout routine but did push his own mower and golf at least one game a week (18 holes) and without any symptoms concerning for ischemia. He had been using a salt substitute to reduce daily intake of salt. He wanted to cut back on drinking diet soda (Diet Pepsi and Dr. Malachi Bonds 1-2 per day). He was drinking 2 of the 12 oz bottles of water daily, though trying to increase water intake by drinking the sugar free flavored water. He was clumsy and bumped into things a lot. He occasionally had numbness in his foot but no s/sx  consistent with PAD or claudication. He was bradycardic with QRS now progressed to LBBB.  Significant murmur on exam.  Subsequent echo 02/2020 showed normal LVEF with mild LVH and G1 DD, as well as mild aortic stenosis.  Seen 9/13 with recommendation for 9/23 cardiac CT.   Cardiac CTA showed coronary artery calcium score of 2596.  LAD was heavily calcified in the proximal segment with 70 to 99% stenosis.  First diagonal had 50 to 69% stenosis.  Dominant RCA showed mixed calcified and noncalcified plaque with 70 to  99% stenosis in the mid vessel.  CT FFR showed significant flow reduction in the mid and distal vessel.  There was total occlusion of the mid RCA. Recommendation was to establish with Miguel Bradley establish primary cardiologist with our clinic at that time and discuss further ischemic work-up.  He was seen 03/10/2020 by Miguel Bradley and reportedly doing well.  Recommendation was for catheterization based on his cardiac CTA and with consideration of his risk factors, including history of diabetes mellitus.    Subsequent 03/16/2020 catheterization showed significant RCA with recommendation for medical management over intervention.  He was noted to have 80 to 90% distal RCA stenosis by cath.  Nonobstructive left coronary artery disease was noted.  No intervention was performed.  Given he was asymptomatic / CCS class 1 sx and a diabetic, MPI was recommended to assess for silent ischemia.    Subsequent 04/05/2020 MPI was ruled low risk, suggestive that the RCA dz was not having significant impact on his blood flow.     Recommendation was for ongoing medical management and risk factor modification.  Today, 06/21/2020, he returns to clinic to review his recent MPI and ischemic work-up.  He continues to deny any symptoms consistent with angina, including chest pain or shortness of breath at rest or with exertion.  No presyncope or syncope.  No recent falls or loss of consciousness.  No signs or symptoms of volume overload reported.  He denies any signs or symptoms of bleeding.  He denies irregular exercise routine but continues to note he is able to golf and mow his yard without significant symptoms.  He is reassured by the most recent ischemic work-up and both he and his daughter have many questions about lifestyle and medication changes to prevent worsening CAD moving forward.  On review of diet, he reports eating salty and sugar coated nuts (peanuts, cashews, etc.).  He enjoys chips and preprocessed snacks.  He has a sweet  tooth and enjoys payday candy bars among other treats.  He reports drinking lots of diet soda.  He mainly eats green beans in order to get his vegetables.  He reports eating lots of pork products and red meat, including cheeseburgers and steak.  He frequently enjoys potatoes but is trying to cut back on white bread.  He is not monitoring his blood pressure at home but does report an old BP cuff in the closet.  He is uncertain if this is a brachial or wrist cuff with details provided regarding accurate BP measurement techniques and preferable BP cuffs.  Long conversation regarding heart healthy diet, fluid /salt/sugar/carb restrictions, medication management, BP monitoring and goals, weight monitoring, increased activity, and periodic monitoring with labs /studies/clinic visits.  We reviewed concerning symptoms for angina and reasons to call the office.  In addition, we reviewed indications for sublingual nitro.  Addition of GDMT / SGLT2 medications reviewed with patient preference to defer.  Most recent elevated triglycerides reviewed with patient preference  to defer any medication additions at this time and reassess at RTC.   Home Medications    Prior to Admission medications   Medication Sig Start Date End Date Taking? Authorizing Provider  aspirin EC 81 MG tablet Take 81 mg by mouth daily.   Yes [provider]  Cholecalciferol (VITAMIN D) 2000 UNITS tablet Take 2,000 Units by mouth daily.     Yes [provider]  dorzolamide-timolol (COSOPT) 22.3-6.8 MG/ML ophthalmic solution Place 1 drop into both eyes 2 (two) times daily.  08/18/15  Yes [provider]  fenofibrate 160 MG tablet TAKE 1 TABLET BY MOUTH EVERY DAY 09/01/19  Yes Bedsole, Amy E, MD  hydrochlorothiazide (HYDRODIURIL) 25 MG tablet TAKE 1 TABLET BY MOUTH EVERY DAY 08/04/19  Yes Bedsole, Amy E, MD  latanoprost (XALATAN) 0.005 % ophthalmic solution Place 1 drop into both eyes at bedtime.  11/23/14  Yes [provider]  MEGARED OMEGA-3 KRILL OIL PO Take 1 capsule by mouth daily.   Yes [provider]  metFORMIN (GLUCOPHAGE) 500 MG tablet TAKE ONE TABLET BY MOUTH TWICE DAILY WITH A MEAL 01/13/20  Yes Bedsole, Amy E, MD  rosuvastatin (CRESTOR) 20 MG tablet TAKE 1 TABLET BY MOUTH EVERY DAY 02/18/19  Yes Bedsole, Amy E, MD    Review of Systems    He denies chest pain, palpitations, dyspnea, pnd, orthopnea, n, v, dizziness, syncope, edema, weight gain, or early satiety.  All other systems reviewed and are otherwise negative except as noted above.  Physical Exam    VS:  BP 124/64 (BP Location: Left Arm, Patient Position: Sitting, Cuff Size: Normal)    Pulse 68    Ht 5\' 10"  (1.778 m)    Wt 219 lb 2 oz (99.4 kg)    SpO2 98%    BMI 31.44 kg/m  , BMI Body mass index is 31.44 kg/m. GEN: Well nourished, well developed, in no acute distress.  Joined by his daughter. HEENT: normal. Neck: Supple, no JVD, carotid bruits, or masses. Cardiac: bradycardic but regular, 1/6 systolic murmur best appreciated RUSB. No rubs, or gallops. No clubbing, cyanosis, trace bilateral lower extremity edema.  Radials/DP/PT 2+ and equal bilaterally.  Respiratory:  Respirations regular and unlabored, clear to auscultation bilaterally. GI: Soft, nontender, nondistended, BS + x 4. MS: no deformity or atrophy. Skin: warm and dry, no rash.  Neuro:  Strength and sensation are intact. Psych: Normal affect.  Accessory Clinical Findings    ECG personally reviewed by me today -no EKG performed today.  VITALS Reviewed today   Temp Readings from Last 3 Encounters:  03/16/20 98.5 F (36.9 C) (Oral)  12/30/19 98.5 F (36.9 C) (Temporal)  07/02/19 98 F (36.7 C) (Temporal)   BP Readings from Last 3 Encounters:  06/21/20 124/64  03/22/20 110/60  03/16/20 121/70   Pulse Readings from Last 3 Encounters:  06/21/20 68  03/22/20 (!) 49  03/16/20 (!) 52    Wt Readings from Last 3 Encounters:  06/21/20 219 lb 2 oz (99.4  kg)  03/22/20 215 lb 12.8 oz (97.9 kg)  03/16/20 216 lb (98 kg)     LABS  reviewed today   Lab Results  Component Value Date   WBC 8.8 03/10/2020   HGB 16.1 03/10/2020   HCT 46.3 03/10/2020   MCV 90.6 03/10/2020   PLT 271 03/10/2020   Lab Results  Component Value Date   CREATININE 1.31 (H) 03/10/2020   BUN 17 03/10/2020   NA 139 03/10/2020  K 3.8 03/10/2020   CL 104 03/10/2020   CO2 24 03/10/2020   Lab Results  Component Value Date   ALT 13 12/26/2019   AST 17 12/26/2019   ALKPHOS 56 12/26/2019   BILITOT 0.6 12/26/2019   Lab Results  Component Value Date   CHOL 119 12/26/2019   HDL 31.20 (L) 12/26/2019   LDLCALC 54 11/28/2013   LDLDIRECT 61.0 12/26/2019   TRIG 220.0 (H) 12/26/2019   CHOLHDL 4 12/26/2019    Lab Results  Component Value Date   HGBA1C 6.9 (H) 12/26/2019   Lab Results  Component Value Date   TSH 0.85 11/16/2009     STUDIES/PROCEDURES reviewed today   MPI 04/05/20  There was no ST segment deviation noted during stress.  No T wave inversion was noted during stress.  The study is normal.  This is a low risk study.  The left ventricular ejection fraction is normal (55-65%).  CT attenuation images show evidence of aortic and three-vessel coronary artery calcifications  R/LHC 03/16/20 Conclusions: 1. Severe single-vessel coronary artery disease with 80-90% stenosis involving the distal RCA.  There is also 80% stenosis involving small D2 branch (vessel diameter <1.5 mm). 2. Mild, non-obstructive coronary artery disease involving LAD, ramus intermedius, and LCx. 3. Mildly elevated left ventricular filling pressure. 4. Mild aortic stenosis. Recommendations: 1. Given lack of symptoms (CCS class I), recommend aggressive medical therapy to prevent progression of disease. 2. Consider outpatient functional study to exclude significant ischemic burden confounded by silent ischemia.  Echo 02/19/20 1. Left ventricular ejection fraction, by  estimation, is 60 to 65%. The  left ventricle has normal function. The left ventricle has no regional  wall motion abnormalities. There is mild left ventricular hypertrophy.  Left ventricular diastolic parameters  are consistent with Grade I diastolic dysfunction (impaired relaxation).  2. Right ventricular systolic function is normal. The right ventricular  size is normal.  3. Mild aortic valve stenosis.   Carotid US Right Carotid: Velocities in the right ICA are consistent with a 1-39%  stenosis. Non-hemodynamically significant plaque <50% noted in the  CCA.  Left Carotid: Evidence consistent with a total occlusion of the left ICA.  Non-hemodynamically significant plaque <50% noted in the CCA.  Vertebrals: Bilateral vertebral arteries demonstrate antegrade flow.  Subclavians: Normal flow hemodynamics were seen in bilateral subclavian        arteries.   Assessment & Plan   Coronary artery disease  -- No symptoms concerning for angina.  Previous EKG with nonspecific IVCD and LBBB like morphology.  No EKG today. 02/2020 echo with nl EF, G1DD, no RWMA.  Cardiac CTA showed multivessel CAD with recommendation to establish with Dr. Saunders Bradley and discuss further work-up with cath. 03/2020 R/LHC showed 80 -90%s of distal RCA.  Nonobstructive left coronary artery disease noted.  No intervention performed.  As he was asymptomatic and a diabetic, Lexiscan Myoview recommended and ruled out silent ischemia.  No further ischemic workup at this time. Continue medical management in the setting of CCS class I symptoms and single-vessel CAD.  Continue ASA and statin therapy for secondary prevention.  Bradycardia precludes addition of BB at this time.  Continue statin and PRN SL nitro. Discussed addition of SGL 2 inhibitors with patient preference at this time to defer.  Long discussion regarding lifestyle changes, including diet and increased activity/ exercise.   Mild aortic stenosis -- Mild aortic  stenosis by echo.  Pt asymptomatic. Continue to monitor with repeat echo in 2 to 3 years,  sooner if symptoms of worsening valvular disease are reported.  LDL goal <70 Hypertriglyceridemia --LDL goal <70 with previous LDL 61 and at goal. TG elevated despite fenofibrate with discussion of escalation of statin versus addition of Vascepa.  On further discussion of Vascepa, he does seem somewhat open to this medication but prefers to defer for now.  Long discussion regarding different medication options.  He notes concern regarding taking too many medications, including medication side effects and interactions between medications. Reviewed GDMT and medical management as a preventative measure to prevent worsening CAD. Pt preference is still to defer medication changes today.  Recommend discussed diet and lifestyle changes. AVS details include discussion regarding diet and lifestyle changes.  Reassess if pt agreeable to Vascepa versus escalation of statin at RTC. Reassess SGL2i at RTC. Continue to monitor lipids. Pt agreeable to a recheck of LDL / Tg at RTC.  Continue Crestor 20mg  dailly for now.  Hypertension, goal <130/80 --BP today well controlled. Goal BP <130/80. Discussed home BP monitoring in great detail. Discussed salt and fluid intake, as well as low salt diet. Continue current HCTZ 25mg  daily.  Carotid dz, occluded LICA --As above in HPI. LICA is chronically occluded.  Most recent US of carotids as above under CV studies. Continue ASA, statin, and fenofibrate. BP and glycemic control.  Lifestyle changes, including diet and exercise, also discussed with pt. Follow-up as directed with vascular surgery.  DM2 with retinopathy and neuropathy --Glycemic control recommended for risk factor modification. Last A1C 6.9. Continue monitoring and medications per PCP.  Discussed Tg control in detail as above. Revisit Vascepa / SGL 2 inhibitors at RTC.  Monitored by opthalmology for retinopathy. Reports ongoing  sx of peripheral neuropathy.   Medication changes: None Labs ordered: None Studies / Imaging ordered: None Future: SGL2 /Vascepa/increase statin dose if patient agreeable, addition of beta-blocker if heart rate allows. Periodic echo to monitor AS.  Disposition: RTC 6 months or sooner if needed  Total time spent with patient today 45 minutes. This includes reviewing records, evaluating the patient, and coordinating care. Face-to-face time >50%.   Arvil Chaco, PA-C 06/21/2020

## 2020-06-21 ENCOUNTER — Other Ambulatory Visit: Payer: Self-pay

## 2020-06-21 ENCOUNTER — Encounter: Payer: Self-pay | Admitting: Physician Assistant

## 2020-06-21 ENCOUNTER — Telehealth: Payer: Self-pay | Admitting: Family Medicine

## 2020-06-21 ENCOUNTER — Ambulatory Visit: Payer: PPO | Admitting: Physician Assistant

## 2020-06-21 VITALS — BP 124/64 | HR 68 | Ht 70.0 in | Wt 219.1 lb

## 2020-06-21 DIAGNOSIS — E1159 Type 2 diabetes mellitus with other circulatory complications: Secondary | ICD-10-CM

## 2020-06-21 DIAGNOSIS — E785 Hyperlipidemia, unspecified: Secondary | ICD-10-CM | POA: Diagnosis not present

## 2020-06-21 DIAGNOSIS — I2510 Atherosclerotic heart disease of native coronary artery without angina pectoris: Secondary | ICD-10-CM | POA: Diagnosis not present

## 2020-06-21 DIAGNOSIS — I10 Essential (primary) hypertension: Secondary | ICD-10-CM | POA: Diagnosis not present

## 2020-06-21 DIAGNOSIS — I350 Nonrheumatic aortic (valve) stenosis: Secondary | ICD-10-CM | POA: Diagnosis not present

## 2020-06-21 DIAGNOSIS — I6522 Occlusion and stenosis of left carotid artery: Secondary | ICD-10-CM

## 2020-06-21 DIAGNOSIS — R9439 Abnormal result of other cardiovascular function study: Secondary | ICD-10-CM | POA: Diagnosis not present

## 2020-06-21 NOTE — Patient Instructions (Addendum)
Medication Instructions:  Your physician recommends that you continue on your current medications as directed. Please refer to the Current Medication list given to you today.  *If you need a refill on your cardiac medications before your next appointment, please call your pharmacy*   Lab Work: None ordered    Testing/Procedures: None ordered   Follow-Up: At [facility], you and your health needs are our priority.  As part of our continuing mission to provide you with exceptional heart care, we have created designated Provider Care Teams.  These Care Teams include your primary Cardiologist (physician) and Advanced Practice Providers (APPs -  Physician Assistants and Nurse Practitioners) who all work together to provide you with the care you need, when you need it.  We recommend signing up for the patient portal called "MyChart".  Sign up information is provided on this After Visit Summary.  MyChart is used to connect with patients for Virtual Visits (Telemedicine).  Patients are able to view lab/test results, encounter notes, upcoming appointments, etc.  Non-urgent messages can be sent to your provider as well.   To learn more about what you can do with MyChart, go to [email or web address].    Your next appointment:   6 month(s)  The format for your next appointment:   In Person  Provider:   You may see Dr, End or one of the following Advanced Practice Providers on your designated Care Team:     Marrianne Mood, PA-C     Other Instructions  Goals 1) Diet as below.  2) BP monitoring. 3) Increase activity.  Blood pressure:  Goal BP 130/80 or lower Check your device's accuracy against an office model once a year if possible. You can do so by bringing your cuff into the office and notifying the person that rooms you that you would like to check your cuff against our office readings. We recommend upper arm BP cuffs over that of the wrist. Measure your BP maximum twice  daily. The first in the morning before eating or taking any medications and the second in the evening. Each time you measure, take an additional reading if abnormal to ensure accurate.  Do not measure BP right after you wake up and try to take it before morning medications and after. Take BP before exercising. Avoid caffeine, tobacco, and alcohol for 30 minutes before taking a measurement. Sit quietly for five minutes in a comfortable position with your legs and ankles uncrossed and back supported. Have your arm supported and at the level of your heart. Always use the same arm when taking your blood pressure. Place the cuff over bare skin rather than clothing. If needed, take a repeat BP reading by waiting 1-3 minutes after the first reading.  Blood pressure varies often throughout the day with higher readings in the morning. BP may also be lower at home than in the office.   It is helpful to document the time of each BP reading, as well as any activity or medications taken around the reading. In addition, it is helpful to include HR. Please bring your BP log into the office.   If your blood pressure is consistently elevated with top number above 180 and bottom above 120, this can damage the body. If you have severe increase in your blood pressure or concerning symptoms of severe chest pain, headache with confusion and blurred vision, severe abdominal or back pain, SOB, seizures, or LOC, go to the ED.  130/80 or lower is your BP goal.  Heart-Healthy Eating Plan Many factors influence your heart (coronary) health, including eating and exercise habits. Coronary risk increases with abnormal blood fat (lipid) levels. Heart-healthy meal planning includes limiting unhealthy fats, increasing healthy fats, and making other diet and lifestyle changes. What are tips for following this plan? Cooking Cook foods using methods other than frying. Baking, boiling, grilling, and broiling are all good options. Other  ways to reduce fat include:  Removing the skin from poultry.  Removing all visible fats from meats.  Steaming vegetables in water or broth. Meal planning  At meals, imagine dividing your plate into fourths: ? Fill one-half of your plate with vegetables and green salads. ? Fill one-fourth of your plate with whole grains. ? Fill one-fourth of your plate with lean protein foods.  Eat 4-5 servings of vegetables per day. One serving equals 1 cup raw or cooked vegetable, or 2 cups raw leafy greens.  Eat 4-5 servings of fruit per day. One serving equals 1 medium whole fruit,  cup dried fruit,  cup fresh, frozen, or canned fruit, or  cup 100% fruit juice.  Eat more foods that contain soluble fiber. Examples include apples, broccoli, carrots, beans, peas, and barley. Aim to get 25-30 g of fiber per day.  Increase your consumption of legumes, nuts, and seeds to 4-5 servings per week. One serving of dried beans or legumes equals  cup cooked, 1 serving of nuts is  cup, and 1 serving of seeds equals 1 tablespoon.   Fats  Choose healthy fats more often. Choose monounsaturated and polyunsaturated fats, such as olive and canola oils, flaxseeds, walnuts, almonds, and seeds.  Eat more omega-3 fats. Choose salmon, mackerel, sardines, tuna, flaxseed oil, and ground flaxseeds. Aim to eat fish at least 2 times each week.  Check food labels carefully to identify foods with trans fats or high amounts of saturated fat.  Limit saturated fats. These are found in animal products, such as meats, butter, and cream. Plant sources of saturated fats include palm oil, palm kernel oil, and coconut oil.  Avoid foods with partially hydrogenated oils in them. These contain trans fats. Examples are stick margarine, some tub margarines, cookies, crackers, and other baked goods.  Avoid fried foods. General information  Eat more home-cooked food and less restaurant, buffet, and fast food.  Limit or avoid  alcohol.  Limit foods that are high in starch and sugar.  Lose weight if you are overweight. Losing just 5-10% of your body weight can help your overall health and prevent diseases such as diabetes and heart disease.  Monitor your salt (sodium) intake, especially if you have high blood pressure. Talk with your health care provider about your sodium intake.  Try to incorporate more vegetarian meals weekly. What foods can I eat? Fruits All fresh, canned (in natural juice), or frozen fruits. Vegetables Fresh or frozen vegetables (raw, steamed, roasted, or grilled). Green salads. Grains Most grains. Choose whole wheat and whole grains most of the time. Rice and pasta, including brown rice and pastas made with whole wheat. Meats and other proteins Lean, well-trimmed beef, veal, pork, and lamb. Chicken and [place] without skin. All fish and shellfish. Wild duck, rabbit, pheasant, and venison. Egg whites or low-cholesterol egg substitutes. Dried beans, peas, lentils, and tofu. Seeds and most nuts. Dairy Low-fat or nonfat cheeses, including ricotta and mozzarella. Skim or 1% milk (liquid, powdered, or evaporated). Buttermilk made with low-fat milk. Nonfat or low-fat yogurt. Fats and oils Non-hydrogenated (trans-free) margarines. Vegetable oils, including soybean,  sesame, sunflower, olive, peanut, safflower, corn, canola, and cottonseed. Salad dressings or mayonnaise made with a vegetable oil. Beverages Water (mineral or sparkling). Coffee and tea. Diet carbonated beverages. Sweets and desserts Sherbet, gelatin, and fruit ice. Small amounts of dark chocolate. Limit all sweets and desserts. Seasonings and condiments All seasonings and condiments. The items listed above may not be a complete list of foods and beverages you can eat. Contact a dietitian for more options. What foods are not recommended? Fruits Canned fruit in heavy syrup. Fruit in cream or butter sauce. Fried fruit. Limit  coconut. Vegetables Vegetables cooked in cheese, cream, or butter sauce. Fried vegetables. Grains Breads made with saturated or trans fats, oils, or whole milk. Croissants. Sweet rolls. Donuts. High-fat crackers, such as cheese crackers. Meats and other proteins Fatty meats, such as hot dogs, ribs, sausage, bacon, rib-eye roast or steak. High-fat deli meats, such as salami and bologna. Caviar. Domestic duck and goose. Organ meats, such as liver. Dairy Cream, sour cream, cream cheese, and creamed cottage cheese. Whole milk cheeses. Whole or 2% milk (liquid, evaporated, or condensed). Whole buttermilk. Cream sauce or high-fat cheese sauce. Whole-milk yogurt. Fats and oils Meat fat, or shortening. Cocoa butter, hydrogenated oils, palm oil, coconut oil, palm kernel oil. Solid fats and shortenings, including bacon fat, salt pork, lard, and butter. Nondairy cream substitutes. Salad dressings with cheese or sour cream. Beverages Regular sodas and any drinks with added sugar. Sweets and desserts Frosting. Pudding. Cookies. Cakes. Pies. Milk chocolate or white chocolate. Buttered syrups. Full-fat ice cream or ice cream drinks. The items listed above may not be a complete list of foods and beverages to avoid. Contact a dietitian for more information. Summary  Heart-healthy meal planning includes limiting unhealthy fats, increasing healthy fats, and making other diet and lifestyle changes.  Lose weight if you are overweight. Losing just 5-10% of your body weight can help your overall health and prevent diseases such as diabetes and heart disease.  Focus on eating a balance of foods, including fruits and vegetables, low-fat or nonfat dairy, lean protein, nuts and legumes, whole grains, and heart-healthy oils and fats. This information is not intended to replace advice given to you by your health care provider. Make sure you discuss any questions you have with your health care provider. Document Revised:  07/06/2017 Document Reviewed: 07/06/2017 Elsevier Patient Education  2021 [place].   [email or web address]">  DASH Eating Plan DASH stands for Dietary Approaches to Stop Hypertension. The DASH eating plan is a healthy eating plan that has been shown to:  Reduce high blood pressure (hypertension).  Reduce your risk for type 2 diabetes, heart disease, and stroke.  Help with weight loss. What are tips for following this plan? Reading food labels  Check food labels for the amount of salt (sodium) per serving. Choose foods with less than 5 percent of the Daily Value of sodium. Generally, foods with less than 300 milligrams (mg) of sodium per serving fit into this eating plan.  To find whole grains, look for the word "whole" as the first word in the ingredient list. Shopping  Buy products labeled as "low-sodium" or "no salt added."  Buy fresh foods. Avoid canned foods and pre-made or frozen meals. Cooking  Avoid adding salt when cooking. Use salt-free seasonings or herbs instead of table salt or sea salt. Check with your health care provider or pharmacist before using salt substitutes.  Do not fry foods. Cook foods using healthy methods such as baking, boiling, grilling,  roasting, and broiling instead.  Cook with heart-healthy oils, such as olive, canola, avocado, soybean, or sunflower oil. Meal planning  Eat a balanced diet that includes: ? 4 or more servings of fruits and 4 or more servings of vegetables each day. Try to fill one-half of your plate with fruits and vegetables. ? 6-8 servings of whole grains each day. ? Less than 6 oz (170 g) of lean meat, poultry, or fish each day. A 3-oz (85-g) serving of meat is about the same size as a deck of cards. One egg equals 1 oz (28 g). ? 2-3 servings of low-fat dairy each day. One serving is 1 cup (237 mL). ? 1 serving of nuts, seeds, or beans 5 times each week. ? 2-3 servings of  heart-healthy fats. Healthy fats called omega-3 fatty acids are found in foods such as walnuts, flaxseeds, fortified milks, and eggs. These fats are also found in cold-water fish, such as sardines, salmon, and mackerel.  Limit how much you eat of: ? Canned or prepackaged foods. ? Food that is high in trans fat, such as some fried foods. ? Food that is high in saturated fat, such as fatty meat. ? Desserts and other sweets, sugary drinks, and other foods with added sugar. ? Full-fat dairy products.  Do not salt foods before eating.  Do not eat more than 4 egg yolks a week.  Try to eat at least 2 vegetarian meals a week.  Eat more home-cooked food and less restaurant, buffet, and fast food.   Lifestyle  When eating at a restaurant, ask that your food be prepared with less salt or no salt, if possible.  If you drink alcohol: ? Limit how much you use to:  0-1 drink a day for women who are not pregnant.  0-2 drinks a day for men. ? Be aware of how much alcohol is in your drink. In the [place] one drink equals one 12 oz bottle of beer (355 mL), one 5 oz glass of wine (148 mL), or one 1 oz glass of hard liquor (44 mL). General information  Avoid eating more than 2,300 mg of salt a day. If you have hypertension, you may need to reduce your sodium intake to 1,500 mg a day.  Work with your health care provider to maintain a healthy body weight or to lose weight. Ask what an ideal weight is for you.  Get at least 30 minutes of exercise that causes your heart to beat faster (aerobic exercise) most days of the week. Activities may include walking, swimming, or biking.  Work with your health care provider or dietitian to adjust your eating plan to your individual calorie needs. What foods should I eat? Fruits All fresh, dried, or frozen fruit. Canned fruit in natural juice (without added sugar). Vegetables Fresh or frozen vegetables (raw, steamed, roasted, or grilled). Low-sodium or  reduced-sodium tomato and vegetable juice. Low-sodium or reduced-sodium tomato sauce and tomato paste. Low-sodium or reduced-sodium canned vegetables. Grains Whole-grain or whole-wheat bread. Whole-grain or whole-wheat pasta. Brown rice. [place] Cobbatmeal. Quinoa. Bulgur. Whole-grain and low-sodium cereals. Pita bread. Low-fat, low-sodium crackers. Whole-wheat flour tortillas. Meats and other proteins Skinless chicken or [place]. Ground chicken or [place]. Pork with fat trimmed off. Fish and seafood. Egg whites. Dried beans, peas, or lentils. Unsalted nuts, nut butters, and seeds. Unsalted canned beans. Lean cuts of beef with fat trimmed off. Low-sodium, lean precooked or cured meat, such as sausages or meat loaves. Dairy Low-fat (1%) or fat-free (skim) milk.  Reduced-fat, low-fat, or fat-free cheeses. Nonfat, low-sodium ricotta or cottage cheese. Low-fat or nonfat yogurt. Low-fat, low-sodium cheese. Fats and oils Soft margarine without trans fats. Vegetable oil. Reduced-fat, low-fat, or light mayonnaise and salad dressings (reduced-sodium). Canola, safflower, olive, avocado, soybean, and sunflower oils. Avocado. Seasonings and condiments Herbs. Spices. Seasoning mixes without salt. Other foods Unsalted popcorn and pretzels. Fat-free sweets. The items listed above may not be a complete list of foods and beverages you can eat. Contact a dietitian for more information. What foods should I avoid? Fruits Canned fruit in a light or heavy syrup. Fried fruit. Fruit in cream or butter sauce. Vegetables Creamed or fried vegetables. Vegetables in a cheese sauce. Regular canned vegetables (not low-sodium or reduced-sodium). Regular canned tomato sauce and paste (not low-sodium or reduced-sodium). Regular tomato and vegetable juice (not low-sodium or reduced-sodium). Angie Fava. Olives. Grains Baked goods made with fat, such as croissants, muffins, or some breads. Dry pasta or rice meal packs. Meats and other  proteins Fatty cuts of meat. Ribs. Fried meat. Berniece Salines. Bologna, salami, and other precooked or cured meats, such as sausages or meat loaves. Fat from the back of a pig (fatback). Bratwurst. Salted nuts and seeds. Canned beans with added salt. Canned or smoked fish. Whole eggs or egg yolks. Chicken or [place] with skin. Dairy Whole or 2% milk, cream, and half-and-half. Whole or full-fat cream cheese. Whole-fat or sweetened yogurt. Full-fat cheese. Nondairy creamers. Whipped toppings. Processed cheese and cheese spreads. Fats and oils Butter. Stick margarine. Lard. Shortening. Ghee. Bacon fat. Tropical oils, such as coconut, palm kernel, or palm oil. Seasonings and condiments Onion salt, garlic salt, seasoned salt, table salt, and sea salt. Worcestershire sauce. Tartar sauce. Barbecue sauce. Teriyaki sauce. Soy sauce, including reduced-sodium. Steak sauce. Canned and packaged gravies. Fish sauce. Oyster sauce. Cocktail sauce. Store-bought horseradish. Ketchup. Mustard. Meat flavorings and tenderizers. Bouillon cubes. Hot sauces. Pre-made or packaged marinades. Pre-made or packaged taco seasonings. Relishes. Regular salad dressings. Other foods Salted popcorn and pretzels. The items listed above may not be a complete list of foods and beverages you should avoid. Contact a dietitian for more information. Where to find more information  National Heart, Lung, and Blood Institute: https://wilson-eaton.com/  American Heart Association: www.heart.org  Academy of Nutrition and Dietetics: www.eatright.[place]: www.kidney.org Summary  The DASH eating plan is a healthy eating plan that has been shown to reduce high blood pressure (hypertension). It may also reduce your risk for type 2 diabetes, heart disease, and stroke.  When on the DASH eating plan, aim to eat more fresh fruits and vegetables, whole grains, lean proteins, low-fat dairy, and heart-healthy fats.  With the DASH eating plan,  you should limit salt (sodium) intake to 2,300 mg a day. If you have hypertension, you may need to reduce your sodium intake to 1,500 mg a day.  Work with your health care provider or dietitian to adjust your eating plan to your individual calorie needs. This information is not intended to replace advice given to you by your health care provider. Make sure you discuss any questions you have with your health care provider. Document Revised: 05/02/2019 Document Reviewed: 05/02/2019 Elsevier Patient Education  2021 [employer].

## 2020-06-21 NOTE — Telephone Encounter (Signed)
-----   Message from Ellamae Sia sent at 06/14/2020  2:51 PM EST ----- Regarding: Lab orders, Thursday, 1.20.22 Patient is scheduled for CPX labs, please order future labs, Thanks , Karna Christmas

## 2020-07-01 ENCOUNTER — Ambulatory Visit: Payer: PPO

## 2020-07-01 ENCOUNTER — Other Ambulatory Visit (INDEPENDENT_AMBULATORY_CARE_PROVIDER_SITE_OTHER): Payer: PPO

## 2020-07-01 ENCOUNTER — Other Ambulatory Visit: Payer: Self-pay

## 2020-07-01 DIAGNOSIS — E1159 Type 2 diabetes mellitus with other circulatory complications: Secondary | ICD-10-CM

## 2020-07-01 LAB — LIPID PANEL
Cholesterol: 123 mg/dL (ref 0–200)
HDL: 37.8 mg/dL — ABNORMAL LOW (ref >39.00)
NonHDL: 84.88
Total CHOL/HDL Ratio: 3
Triglycerides: 219 mg/dL — ABNORMAL HIGH (ref 0.0–149.0)
VLDL: 43.8 mg/dL — ABNORMAL HIGH (ref 0.0–40.0)

## 2020-07-01 LAB — COMPREHENSIVE METABOLIC PANEL
ALT: 11 U/L (ref 0–53)
AST: 16 U/L (ref 0–37)
Albumin: 4.1 g/dL (ref 3.5–5.2)
Alkaline Phosphatase: 55 U/L (ref 39–117)
BUN: 17 mg/dL (ref 6–23)
CO2: 29 mEq/L (ref 19–32)
Calcium: 9.3 mg/dL (ref 8.4–10.5)
Chloride: 101 mEq/L (ref 96–112)
Creatinine, Ser: 1.39 mg/dL (ref 0.40–1.50)
GFR: 48.41 mL/min — ABNORMAL LOW (ref >60.00)
Glucose, Bld: 131 mg/dL — ABNORMAL HIGH (ref 70–99)
Potassium: 4.4 mEq/L (ref 3.5–5.1)
Sodium: 136 mEq/L (ref 135–145)
Total Bilirubin: 0.7 mg/dL (ref 0.2–1.2)
Total Protein: 7 g/dL (ref 6.0–8.3)

## 2020-07-01 LAB — LDL CHOLESTEROL, DIRECT: Direct LDL: 69 mg/dL

## 2020-07-01 LAB — HEMOGLOBIN A1C: Hgb A1c MFr Bld: 6.6 % — ABNORMAL HIGH (ref 4.6–6.5)

## 2020-07-01 NOTE — Progress Notes (Signed)
No critical labs need to be addressed urgently. We will discuss labs in detail at upcoming office visit.   

## 2020-07-05 ENCOUNTER — Other Ambulatory Visit: Payer: Self-pay | Admitting: Family Medicine

## 2020-07-09 ENCOUNTER — Ambulatory Visit (INDEPENDENT_AMBULATORY_CARE_PROVIDER_SITE_OTHER): Payer: PPO | Admitting: Family Medicine

## 2020-07-09 ENCOUNTER — Other Ambulatory Visit: Payer: Self-pay

## 2020-07-09 ENCOUNTER — Encounter: Payer: Self-pay | Admitting: Family Medicine

## 2020-07-09 VITALS — BP 130/70 | HR 60 | Temp 98.2°F | Ht 69.5 in | Wt 218.5 lb

## 2020-07-09 DIAGNOSIS — E11319 Type 2 diabetes mellitus with unspecified diabetic retinopathy without macular edema: Secondary | ICD-10-CM | POA: Diagnosis not present

## 2020-07-09 DIAGNOSIS — R221 Localized swelling, mass and lump, neck: Secondary | ICD-10-CM | POA: Diagnosis not present

## 2020-07-09 DIAGNOSIS — K5904 Chronic idiopathic constipation: Secondary | ICD-10-CM | POA: Diagnosis not present

## 2020-07-09 DIAGNOSIS — E1142 Type 2 diabetes mellitus with diabetic polyneuropathy: Secondary | ICD-10-CM | POA: Diagnosis not present

## 2020-07-09 DIAGNOSIS — E113393 Type 2 diabetes mellitus with moderate nonproliferative diabetic retinopathy without macular edema, bilateral: Secondary | ICD-10-CM | POA: Diagnosis not present

## 2020-07-09 DIAGNOSIS — Z0000 Encounter for general adult medical examination without abnormal findings: Secondary | ICD-10-CM | POA: Diagnosis not present

## 2020-07-09 DIAGNOSIS — I10 Essential (primary) hypertension: Secondary | ICD-10-CM

## 2020-07-09 DIAGNOSIS — K5900 Constipation, unspecified: Secondary | ICD-10-CM | POA: Insufficient documentation

## 2020-07-09 DIAGNOSIS — E7800 Pure hypercholesterolemia, unspecified: Secondary | ICD-10-CM | POA: Diagnosis not present

## 2020-07-09 DIAGNOSIS — I2510 Atherosclerotic heart disease of native coronary artery without angina pectoris: Secondary | ICD-10-CM

## 2020-07-09 LAB — HM DIABETES FOOT EXAM

## 2020-07-09 NOTE — Patient Instructions (Addendum)
For long term control.. start a fiber supplement like Benefiber or metamucil daily. Keep up with water intake.  For significant acute constipation.Marland Kitchen try miralax 17 g daily until regualr bowel movement.   We will call to set up CT neck.

## 2020-07-09 NOTE — Assessment & Plan Note (Signed)
For long term control.. start a fiber supplement like Benefiber or metamucil daily. Keep up with water intake.  For significant acute constipation.Marland Kitchen try miralax 17 g daily until regualr bowel movement.

## 2020-07-09 NOTE — Progress Notes (Signed)
Patient ID: Miguel Bradley, male    DOB: [birth year], 80 y.o.   MRN: [id number]  This visit was conducted in person.  BP 130/70   Pulse 60   Temp 98.2 F (36.8 C) (Temporal)   Ht 5' 9.5" (1.765 m)   Wt 218 lb 8 oz (99.1 kg)   SpO2 96%   BMI 31.80 kg/m    CC:  Chief Complaint  Patient presents with  . Medicare Wellness    Subjective:   HPI: Miguel Bradley is a 80 y.o. male presenting on 07/09/2020 for Medicare Wellness    The patient presents for annual medicare wellness, complete physical and review of chronic health problems. He/She also has the following acute concerns today: Has noted new are  Of swelling without pain  Behind left ear.. no pain, no redness, not sure if changing in size... noted in last few months.  I have personally reviewed the Medicare Annual Wellness questionnaire and have noted 1. The patient's medical and social history 2. Their use of alcohol, tobacco or illicit drugs 3. Their current medications and supplements 4. The patient's functional ability including ADL's, fall risks, home safety risks and hearing or visual             impairment. 5. Diet and physical activities 6. Evidence for depression or mood disorders 7.         Updated provider list Cognitive evaluation was performed and recorded on pt medicare questionnaire form. The patients weight, height, BMI and visual acuity have been recorded in the chart  I have made referrals, counseling and provided education to the patient based review of the above and I have provided the pt with a written personalized care plan for preventive services.   Documentation of this information was scanned into the electronic record under the media tab.   Advance directives and end of life planning reviewed in detail with patient and documented in EMR. Patient given handout on advance care directives if needed. HCPOA and living will updated if needed.   Hearing Screening   125Hz  250Hz  500Hz  1000Hz  2000Hz   3000Hz  4000Hz  6000Hz  8000Hz   Right ear:           Left ear:           Comments: Wears Bilateral Hearing Aides  Vision Screening Comments: Wears Glasses-Eye Exam with Dr. Gloriann Loan and also sees Retina Specialist Dr. Zigmund Daniel   Fall Risk  07/09/2020 07/01/2019 05/07/2019 06/19/2018 06/14/2017  Falls in the past year? 0 0 0 0 No  Comment - - Emmi Telephone Survey: data to providers prior to load - -  Number falls in past yr: - - - - -  Injury with Fall? - - - - -   Has been seeing Dr. Saunders Revel for CAD.  Reviewed note in detail from 03/22/20  Cardiac CTA showed multivessel CAD, including FFR positive LAD disease and chronic total occlusion of the mid RCA.  He underwent cardiac catheterization in 03/2020 that showed 80 to 90% distal RCA stenosis and nonobstructive disease in the left coronary artery.  Given that he was totally asymptomatic, they opted to continue with medical therapy.  Diabetes:   Good control on metformin. Lab Results  Component Value Date   HGBA1C 6.6 (H) 07/01/2020  Using medications without difficulties: Hypoglycemic episodes: Hyperglycemic episodes: Feet problems: Blood Sugars averaging: eye exam within last year:  Hypertension:   At goal on HCTZ.  BP Readings from Last 3 Encounters:  07/09/20 130/70  06/21/20 124/64  03/22/20 110/60  Using medication without problems or lightheadedness:  none Chest pain with exertion:none Edema:none Short of breath: none Average home BPs: Other issues:  Elevated Cholesterol:  LDL at goal < 70 on crestor and fenofibrate Lab Results  Component Value Date   CHOL 123 07/01/2020   HDL 37.80 (L) 07/01/2020   LDLCALC 54 11/28/2013   LDLDIRECT 69.0 07/01/2020   TRIG 219.0 (H) 07/01/2020   CHOLHDL 3 07/01/2020  Using medications without problems: Muscle aches:  Diet compliance: moderate Exercise:  minmial Other complaints:   HAving issues with worse constipation and strianing. BM every 3-4 days.  Patient Care Team: Jinny Sanders,  MD as PCP - General End, Harrell Gave, MD as PCP - Cardiology (Cardiology) Hayden Pedro, MD as Consulting Physician (Ophthalmology) Lorelee Cover., MD as Referring Physician (Ophthalmology) Oneta Rack, MD as Referring Physician (Dermatology) Ladene Artist, MD as Consulting Physician (Gastroenterology) Rosalene Billings., MD as Referring Physician (Dentistry)   Relevant past medical, surgical, family and social history reviewed and updated as indicated. Interim medical history since our last visit reviewed. Allergies and medications reviewed and updated. Outpatient Medications Prior to Visit  Medication Sig Dispense Refill  . aspirin EC 81 MG tablet Take 81 mg by mouth daily.    . Cholecalciferol (VITAMIN D) 2000 UNITS tablet Take 2,000 Units by mouth daily.    . diphenhydramine-acetaminophen (TYLENOL PM) 25-500 MG TABS tablet Take 2 tablets by mouth at bedtime.    . dorzolamide-timolol (COSOPT) 22.3-6.8 MG/ML ophthalmic solution Place 1 drop into both eyes 2 (two) times daily.     . fenofibrate 160 MG tablet TAKE 1 TABLET BY MOUTH EVERY DAY 90 tablet 3  . hydrochlorothiazide (HYDRODIURIL) 25 MG tablet TAKE 1 TABLET BY MOUTH EVERY DAY 90 tablet 3  . latanoprost (XALATAN) 0.005 % ophthalmic solution Place 1 drop into both eyes at bedtime.     . metFORMIN (GLUCOPHAGE) 500 MG tablet TAKE ONE TABLET BY MOUTH TWICE DAILY WITH A MEAL 180 tablet 0  . nitroGLYCERIN (NITROSTAT) 0.4 MG SL tablet Place 1 tablet (0.4 mg total) under the tongue every 5 (five) minutes as needed for chest pain. 25 tablet prn  . Omega-3 Fatty Acids (FISH OIL OMEGA-3 PO) Take 1 capsule by mouth daily.    . rosuvastatin (CRESTOR) 20 MG tablet TAKE 1 TABLET BY MOUTH EVERY DAY 90 tablet 3   No facility-administered medications prior to visit.     Per HPI unless specifically indicated in ROS section below Review of Systems  Constitutional: Negative for fatigue and fever.  HENT: Positive for facial swelling.  Negative for ear pain.        Left neck  swelling at jaw line  Eyes: Negative for pain.  Respiratory: Negative for cough and shortness of breath.   Cardiovascular: Negative for chest pain, palpitations and leg swelling.  Gastrointestinal: Negative for abdominal pain.  Genitourinary: Negative for dysuria.  Musculoskeletal: Negative for arthralgias and neck pain.  Neurological: Negative for syncope, light-headedness and headaches.  Psychiatric/Behavioral: Negative for dysphoric mood.   Objective:  BP 130/70   Pulse 60   Temp 98.2 F (36.8 C) (Temporal)   Ht 5' 9.5" (1.765 m)   Wt 218 lb 8 oz (99.1 kg)   SpO2 96%   BMI 31.80 kg/m   Wt Readings from Last 3 Encounters:  07/09/20 218 lb 8 oz (99.1 kg)  06/21/20 219 lb 2 oz (99.4 kg)  03/22/20 215 lb 12.8 oz (  97.9 kg)      Physical Exam Constitutional:      General: He is not in acute distress.    Appearance: Normal appearance. He is well-developed. He is obese. He is not ill-appearing or toxic-appearing.  HENT:     Head: Normocephalic and atraumatic.     Right Ear: Hearing, tympanic membrane, ear canal and external ear normal.     Left Ear: Hearing, tympanic membrane, ear canal and external ear normal.     Nose: Nose normal.     Mouth/Throat:     Pharynx: Uvula midline.  Eyes:     General: Lids are normal. Lids are everted, no foreign bodies appreciated.     Conjunctiva/sclera: Conjunctivae normal.     Pupils: Pupils are equal, round, and reactive to light.  Neck:     Thyroid: No thyroid mass or thyromegaly.     Vascular: No carotid bruit.     Trachea: Trachea and phonation normal.      Comments: Diffuse firmness and swelling more prominent on left neck, difficult to assess given large neck circumference and obesity Cardiovascular:     Rate and Rhythm: Normal rate and regular rhythm.     Pulses: Normal pulses.     Heart sounds: S1 normal and S2 normal. No murmur heard. No gallop.   Pulmonary:     Breath sounds: Normal  breath sounds. No wheezing, rhonchi or rales.  Abdominal:     General: Bowel sounds are normal.     Palpations: Abdomen is soft.     Tenderness: There is no abdominal tenderness. There is no guarding or rebound.     Hernia: No hernia is present.  Musculoskeletal:     Cervical back: Normal range of motion and neck supple.  Lymphadenopathy:     Cervical: No cervical adenopathy.  Skin:    General: Skin is warm and dry.     Findings: No rash.  Neurological:     Mental Status: He is alert.     Cranial Nerves: No cranial nerve deficit.     Sensory: No sensory deficit.     Gait: Gait normal.     Deep Tendon Reflexes: Reflexes are normal and symmetric.  Psychiatric:        Speech: Speech normal.        Behavior: Behavior normal.        Judgment: Judgment normal.       2 cm oblong, non mobile firm lesion behind left ear at upper jaw.. symmetric compared to right.   Diabetic foot exam: Normal inspection No skin breakdown No calluses  Normal DP pulses Normal sensation to light touch and monofilament Nails thickened     Results for orders placed or performed in visit on 07/01/20  Comprehensive metabolic panel  Result Value Ref Range   Sodium 136 135 - 145 mEq/L   Potassium 4.4 3.5 - 5.1 mEq/L   Chloride 101 96 - 112 mEq/L   CO2 29 19 - 32 mEq/L   Glucose, Bld 131 (H) 70 - 99 mg/dL   BUN 17 6 - 23 mg/dL   Creatinine, Ser 8.09 0.40 - 1.50 mg/dL   Total Bilirubin 0.7 0.2 - 1.2 mg/dL   Alkaline Phosphatase 55 39 - 117 U/L   AST 16 0 - 37 U/L   ALT 11 0 - 53 U/L   Total Protein 7.0 6.0 - 8.3 g/dL   Albumin 4.1 3.5 - 5.2 g/dL   GFR 98.33 (L) >82.50 mL/min  Calcium 9.3 8.4 - 10.5 mg/dL  Lipid panel  Result Value Ref Range   Cholesterol 123 0 - 200 mg/dL   Triglycerides 219.0 (H) 0.0 - 149.0 mg/dL   HDL 37.80 (L) >39.00 mg/dL   VLDL 43.8 (H) 0.0 - 40.0 mg/dL   Total CHOL/HDL Ratio 3    NonHDL 84.88   Hemoglobin A1c  Result Value Ref Range   Hgb A1c MFr Bld 6.6 (H) 4.6 -  6.5 %  LDL cholesterol, direct  Result Value Ref Range   Direct LDL 69.0 mg/dL    This visit occurred during the SARS-CoV-2 public health emergency.  Safety protocols were in place, including screening questions prior to the visit, additional usage of staff PPE, and extensive cleaning of exam room while observing appropriate contact time as indicated for disinfecting solutions.   COVID 19 screen:  No recent travel or known exposure to COVID19 The patient denies respiratory symptoms of COVID 19 at this time. The importance of social distancing was discussed today.   Assessment and Plan The patient's preventative maintenance and recommended screening tests for an annual wellness exam were reviewed in full today. Brought up to date unless services declined.  Counselled on the importance of diet, exercise, and its role in overall health and mortality. The patient's FH and SH was reviewed, including their home life, tobacco status, and drug and alcohol status.   Vaccines:  COVID, PNA, flu and Td uptodate, refuses shingles vaccine...says he has never had chicken pox  Former smoker.Quit 34 years ago.  Colon: 1/2020Dr. Stark,tubular adenoma, repeat 5 years if healthy. Prostate: not indicated after age 39.   Has had yearly eye exam.  Problem List Items Addressed This Visit    Constipation    For long term control.. start a fiber supplement like Benefiber or metamucil daily. Keep up with water intake.  For significant acute constipation.Marland Kitchen try miralax 17 g daily until regualr bowel movement.       Coronary artery disease involving native coronary artery of native heart without angina pectoris (Chronic)    Medical management given asymptomatic, followed by Dr. Saunders Revel.      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  Continue current medication.   Metformin 500 mg BID      Diabetic neuropathy (HCC) (Chronic)   Diabetic retinopathy associated  with type 2 diabetes mellitus ([place]) (Chronic)    Followed by opthamology.      Essential hypertension, benign (Chronic)    Stable, chronic.  Continue current medication.         High cholesterol (Chronic)    Stable, chronic.  Continue current medication.  Crestor 20 mg daily Fenofibrate 160 mg daily        Mass of left side of neck     Given smoking history.. eval mass with CT neck to rule put head and neck cancer.      Relevant Orders   CT Soft Tissue Neck W Contrast (Completed)    Other Visit Diagnoses    Medicare annual wellness visit, subsequent    -  Primary       Eliezer Lofts, MD

## 2020-07-13 ENCOUNTER — Ambulatory Visit: Payer: PPO

## 2020-07-16 ENCOUNTER — Ambulatory Visit: Payer: PPO

## 2020-07-22 ENCOUNTER — Other Ambulatory Visit: Payer: Self-pay

## 2020-07-22 ENCOUNTER — Ambulatory Visit
Admission: RE | Admit: 2020-07-22 | Discharge: 2020-07-22 | Disposition: A | Discharge status: Home / Self Care | Payer: PPO | Source: Ambulatory Visit | Attending: Family Medicine | Admitting: Family Medicine

## 2020-07-22 ENCOUNTER — Telehealth: Payer: Self-pay

## 2020-07-22 ENCOUNTER — Other Ambulatory Visit: Payer: Self-pay | Admitting: Family Medicine

## 2020-07-22 DIAGNOSIS — R221 Localized swelling, mass and lump, neck: Secondary | ICD-10-CM | POA: Insufficient documentation

## 2020-07-22 DIAGNOSIS — R9389 Abnormal findings on diagnostic imaging of other specified body structures: Secondary | ICD-10-CM

## 2020-07-22 DIAGNOSIS — K118 Other diseases of salivary glands: Secondary | ICD-10-CM | POA: Diagnosis not present

## 2020-07-22 DIAGNOSIS — R591 Generalized enlarged lymph nodes: Secondary | ICD-10-CM

## 2020-07-22 DIAGNOSIS — D490 Neoplasm of unspecified behavior of digestive system: Secondary | ICD-10-CM

## 2020-07-22 DIAGNOSIS — I672 Cerebral atherosclerosis: Secondary | ICD-10-CM | POA: Diagnosis not present

## 2020-07-22 DIAGNOSIS — I6523 Occlusion and stenosis of bilateral carotid arteries: Secondary | ICD-10-CM | POA: Diagnosis not present

## 2020-07-22 MED ORDER — IOHEXOL 300 MG/ML  SOLN
75.0000 mL | Freq: Once | INTRAMUSCULAR | Status: AC | PRN
  Administered 2020-07-22: 75 mL via INTRAVENOUS

## 2020-07-22 NOTE — Telephone Encounter (Signed)
I have already communicated with Dr. Diona Browner, and she is aware of CT.

## 2020-07-22 NOTE — Telephone Encounter (Signed)
Pt has been contacted and referral/further evaluation in progress.

## 2020-07-22 NOTE — Telephone Encounter (Signed)
Received call on CT report printed and given to [facility] for Dr. Diona Browner

## 2020-07-22 NOTE — Telephone Encounter (Signed)
Correction Dr. Diona Browner is out of office report was given to Dr. Lorelei Pont for review.

## 2020-07-29 ENCOUNTER — Other Ambulatory Visit: Payer: Self-pay | Admitting: Family Medicine

## 2020-08-05 DIAGNOSIS — H40153 Residual stage of open-angle glaucoma, bilateral: Secondary | ICD-10-CM | POA: Diagnosis not present

## 2020-08-09 DIAGNOSIS — D37030 Neoplasm of uncertain behavior of the parotid salivary glands: Secondary | ICD-10-CM | POA: Diagnosis not present

## 2020-08-09 DIAGNOSIS — H903 Sensorineural hearing loss, bilateral: Secondary | ICD-10-CM | POA: Diagnosis not present

## 2020-08-09 DIAGNOSIS — R590 Localized enlarged lymph nodes: Secondary | ICD-10-CM | POA: Diagnosis not present

## 2020-08-09 DIAGNOSIS — I6523 Occlusion and stenosis of bilateral carotid arteries: Secondary | ICD-10-CM | POA: Diagnosis not present

## 2020-08-09 DIAGNOSIS — R591 Generalized enlarged lymph nodes: Secondary | ICD-10-CM | POA: Diagnosis not present

## 2020-08-10 DIAGNOSIS — C07 Malignant neoplasm of parotid gland: Secondary | ICD-10-CM

## 2020-08-10 HISTORY — PX: PAROTIDECTOMY W/ NECK DISSECTION TOTAL: SUR1004

## 2020-08-10 HISTORY — DX: Malignant neoplasm of parotid gland: C07

## 2020-08-12 ENCOUNTER — Telehealth: Payer: Self-pay | Admitting: Family Medicine

## 2020-08-12 NOTE — Chronic Care Management (AMB) (Signed)
  Chronic Care Management   Note  08/12/2020 Name: DAYN BARICH MRN: [id number] DOB: Sep 17, 1940  Hunt Oris Conwell is a 80 y.o. year old male who is a primary care patient of Bedsole, Amy E, MD. I reached out to [name] by phone today in response to a referral sent by Mr. [place] PCP, Jinny Sanders, MD.   Mr. Swicegood was given information about Chronic Care Management services today including:  1. CCM service includes personalized support from designated clinical staff supervised by his physician, including individualized plan of care and coordination with other care providers 2. 24/7 contact phone numbers for assistance for urgent and routine care needs. 3. Service will only be billed when office clinical staff spend 20 minutes or more in a month to coordinate care. 4. Only one practitioner may furnish and bill the service in a calendar month. 5. The patient may stop CCM services at any time (effective at the end of the month) by phone call to the office staff.   Patient agreed to services and verbal consent obtained.   Follow up plan:   [place]

## 2020-08-13 DIAGNOSIS — D37030 Neoplasm of uncertain behavior of the parotid salivary glands: Secondary | ICD-10-CM | POA: Diagnosis not present

## 2020-08-13 DIAGNOSIS — R599 Enlarged lymph nodes, unspecified: Secondary | ICD-10-CM | POA: Diagnosis not present

## 2020-08-13 DIAGNOSIS — R911 Solitary pulmonary nodule: Secondary | ICD-10-CM | POA: Diagnosis not present

## 2020-08-13 DIAGNOSIS — Z85828 Personal history of other malignant neoplasm of skin: Secondary | ICD-10-CM | POA: Diagnosis not present

## 2020-08-13 DIAGNOSIS — C089 Malignant neoplasm of major salivary gland, unspecified: Secondary | ICD-10-CM | POA: Diagnosis not present

## 2020-08-13 DIAGNOSIS — R918 Other nonspecific abnormal finding of lung field: Secondary | ICD-10-CM | POA: Diagnosis not present

## 2020-08-17 DIAGNOSIS — I10 Essential (primary) hypertension: Secondary | ICD-10-CM | POA: Diagnosis not present

## 2020-08-17 DIAGNOSIS — I6522 Occlusion and stenosis of left carotid artery: Secondary | ICD-10-CM | POA: Diagnosis not present

## 2020-08-17 DIAGNOSIS — N1831 Chronic kidney disease, stage 3a: Secondary | ICD-10-CM | POA: Diagnosis not present

## 2020-08-17 DIAGNOSIS — C07 Malignant neoplasm of parotid gland: Secondary | ICD-10-CM | POA: Diagnosis not present

## 2020-08-17 DIAGNOSIS — I2510 Atherosclerotic heart disease of native coronary artery without angina pectoris: Secondary | ICD-10-CM | POA: Diagnosis not present

## 2020-08-17 DIAGNOSIS — E1122 Type 2 diabetes mellitus with diabetic chronic kidney disease: Secondary | ICD-10-CM | POA: Diagnosis not present

## 2020-08-22 DIAGNOSIS — R221 Localized swelling, mass and lump, neck: Secondary | ICD-10-CM | POA: Insufficient documentation

## 2020-08-22 NOTE — Assessment & Plan Note (Signed)
Stable, chronic.  Continue current medication.    

## 2020-08-22 NOTE — Assessment & Plan Note (Signed)
Medical management given asymptomatic, followed by Dr. Saunders Revel.

## 2020-08-22 NOTE — Assessment & Plan Note (Signed)
Followed by opthamology.

## 2020-08-22 NOTE — Assessment & Plan Note (Addendum)
New,  Given smoking history.. eval mass with CT neck to rule put head and neck cancer.

## 2020-08-22 NOTE — Assessment & Plan Note (Signed)
Stable, chronic.  Continue current medication.  Crestor 20 mg daily Fenofibrate 160 mg daily

## 2020-08-22 NOTE — Assessment & Plan Note (Signed)
Stable, chronic.  Continue current medication.   Metformin 500 mg BID 

## 2020-08-24 ENCOUNTER — Telehealth: Payer: Self-pay | Admitting: Family Medicine

## 2020-08-24 DIAGNOSIS — H905 Unspecified sensorineural hearing loss: Secondary | ICD-10-CM | POA: Diagnosis not present

## 2020-08-24 DIAGNOSIS — N189 Chronic kidney disease, unspecified: Secondary | ICD-10-CM | POA: Diagnosis not present

## 2020-08-24 DIAGNOSIS — I129 Hypertensive chronic kidney disease with stage 1 through stage 4 chronic kidney disease, or unspecified chronic kidney disease: Secondary | ICD-10-CM | POA: Diagnosis not present

## 2020-08-24 DIAGNOSIS — E1140 Type 2 diabetes mellitus with diabetic neuropathy, unspecified: Secondary | ICD-10-CM | POA: Diagnosis not present

## 2020-08-24 DIAGNOSIS — C07 Malignant neoplasm of parotid gland: Secondary | ICD-10-CM | POA: Diagnosis not present

## 2020-08-24 DIAGNOSIS — I350 Nonrheumatic aortic (valve) stenosis: Secondary | ICD-10-CM | POA: Diagnosis not present

## 2020-08-24 DIAGNOSIS — I6522 Occlusion and stenosis of left carotid artery: Secondary | ICD-10-CM | POA: Diagnosis not present

## 2020-08-24 DIAGNOSIS — I2510 Atherosclerotic heart disease of native coronary artery without angina pectoris: Secondary | ICD-10-CM | POA: Diagnosis not present

## 2020-08-24 DIAGNOSIS — E11319 Type 2 diabetes mellitus with unspecified diabetic retinopathy without macular edema: Secondary | ICD-10-CM | POA: Diagnosis not present

## 2020-08-24 DIAGNOSIS — E669 Obesity, unspecified: Secondary | ICD-10-CM | POA: Diagnosis not present

## 2020-08-24 DIAGNOSIS — Z7984 Long term (current) use of oral hypoglycemic drugs: Secondary | ICD-10-CM | POA: Diagnosis not present

## 2020-08-24 DIAGNOSIS — J301 Allergic rhinitis due to pollen: Secondary | ICD-10-CM | POA: Diagnosis not present

## 2020-08-24 DIAGNOSIS — G8918 Other acute postprocedural pain: Secondary | ICD-10-CM | POA: Diagnosis not present

## 2020-08-24 DIAGNOSIS — Z01812 Encounter for preprocedural laboratory examination: Secondary | ICD-10-CM | POA: Diagnosis not present

## 2020-08-24 DIAGNOSIS — Z7982 Long term (current) use of aspirin: Secondary | ICD-10-CM | POA: Diagnosis not present

## 2020-08-24 DIAGNOSIS — Z87891 Personal history of nicotine dependence: Secondary | ICD-10-CM | POA: Diagnosis not present

## 2020-08-24 DIAGNOSIS — Z20822 Contact with and (suspected) exposure to covid-19: Secondary | ICD-10-CM | POA: Diagnosis not present

## 2020-08-24 DIAGNOSIS — E291 Testicular hypofunction: Secondary | ICD-10-CM | POA: Diagnosis not present

## 2020-08-24 DIAGNOSIS — E210 Primary hyperparathyroidism: Secondary | ICD-10-CM | POA: Diagnosis not present

## 2020-08-24 DIAGNOSIS — D37030 Neoplasm of uncertain behavior of the parotid salivary glands: Secondary | ICD-10-CM | POA: Diagnosis not present

## 2020-08-24 DIAGNOSIS — E1122 Type 2 diabetes mellitus with diabetic chronic kidney disease: Secondary | ICD-10-CM | POA: Diagnosis not present

## 2020-08-24 DIAGNOSIS — C770 Secondary and unspecified malignant neoplasm of lymph nodes of head, face and neck: Secondary | ICD-10-CM | POA: Diagnosis not present

## 2020-08-24 DIAGNOSIS — Z6831 Body mass index (BMI) 31.0-31.9, adult: Secondary | ICD-10-CM | POA: Diagnosis not present

## 2020-08-24 NOTE — Progress Notes (Signed)
  Chronic Care Management   Outreach Note  08/24/2020 Name: Miguel Bradley MRN: [id number] DOB: May 09, 1941  Referred by: Jinny Sanders, MD Reason for referral : No chief complaint on file.   An unsuccessful telephone outreach was attempted today. The patient was referred to the pharmacist for assistance with care management and care coordination.   Follow Up Plan:   Lauretta Grill [facility] Scheduler

## 2020-09-01 ENCOUNTER — Other Ambulatory Visit: Payer: Self-pay | Admitting: Family Medicine

## 2020-09-06 ENCOUNTER — Ambulatory Visit: Payer: PPO | Admitting: Radiation Oncology

## 2020-09-08 ENCOUNTER — Telehealth: Payer: Self-pay | Admitting: Family Medicine

## 2020-09-08 DIAGNOSIS — Z09 Encounter for follow-up examination after completed treatment for conditions other than malignant neoplasm: Secondary | ICD-10-CM | POA: Diagnosis not present

## 2020-09-08 DIAGNOSIS — G510 Bell's palsy: Secondary | ICD-10-CM | POA: Diagnosis not present

## 2020-09-08 DIAGNOSIS — C089 Malignant neoplasm of major salivary gland, unspecified: Secondary | ICD-10-CM | POA: Diagnosis not present

## 2020-09-08 DIAGNOSIS — H0223B Paralytic lagophthalmos left eye, upper and lower eyelids: Secondary | ICD-10-CM | POA: Diagnosis not present

## 2020-09-08 NOTE — Progress Notes (Signed)
  Chronic Care Management   Outreach Note  09/08/2020 Name: Miguel Bradley MRN: [id number] DOB: Aug 07, 1940  Referred by: Jinny Sanders, MD Reason for referral : No chief complaint on file.   A second unsuccessful telephone outreach was attempted today. The patient was referred to pharmacist for assistance with care management and care coordination.  Follow Up Plan:   Lauretta Grill Up[facility] Scheduler

## 2020-09-11 ENCOUNTER — Encounter: Payer: Self-pay | Admitting: Internal Medicine

## 2020-09-11 DIAGNOSIS — C07 Malignant neoplasm of parotid gland: Secondary | ICD-10-CM | POA: Insufficient documentation

## 2020-09-13 ENCOUNTER — Ambulatory Visit
Admission: RE | Admit: 2020-09-13 | Discharge: 2020-09-13 | Disposition: A | Discharge status: Home / Self Care | Payer: PPO | Source: Ambulatory Visit | Attending: Radiation Oncology | Admitting: Radiation Oncology

## 2020-09-13 ENCOUNTER — Inpatient Hospital Stay: Payer: PPO | Attending: Internal Medicine | Admitting: Internal Medicine

## 2020-09-13 ENCOUNTER — Encounter: Payer: Self-pay | Admitting: Radiation Oncology

## 2020-09-13 ENCOUNTER — Inpatient Hospital Stay: Payer: PPO

## 2020-09-13 ENCOUNTER — Other Ambulatory Visit: Payer: Self-pay

## 2020-09-13 ENCOUNTER — Other Ambulatory Visit: Payer: Self-pay | Admitting: *Deleted

## 2020-09-13 ENCOUNTER — Encounter: Payer: Self-pay | Admitting: Internal Medicine

## 2020-09-13 ENCOUNTER — Telehealth: Payer: Self-pay | Admitting: *Deleted

## 2020-09-13 VITALS — BP 140/69 | HR 58 | Temp 95.0°F | Resp 18 | Ht 69.5 in | Wt 212.0 lb

## 2020-09-13 DIAGNOSIS — Z7984 Long term (current) use of oral hypoglycemic drugs: Secondary | ICD-10-CM | POA: Insufficient documentation

## 2020-09-13 DIAGNOSIS — Z7982 Long term (current) use of aspirin: Secondary | ICD-10-CM | POA: Diagnosis not present

## 2020-09-13 DIAGNOSIS — C07 Malignant neoplasm of parotid gland: Secondary | ICD-10-CM

## 2020-09-13 DIAGNOSIS — C778 Secondary and unspecified malignant neoplasm of lymph nodes of multiple regions: Secondary | ICD-10-CM | POA: Insufficient documentation

## 2020-09-13 DIAGNOSIS — Z85820 Personal history of malignant melanoma of skin: Secondary | ICD-10-CM | POA: Insufficient documentation

## 2020-09-13 DIAGNOSIS — I10 Essential (primary) hypertension: Secondary | ICD-10-CM | POA: Diagnosis not present

## 2020-09-13 DIAGNOSIS — E213 Hyperparathyroidism, unspecified: Secondary | ICD-10-CM | POA: Diagnosis not present

## 2020-09-13 DIAGNOSIS — Z8249 Family history of ischemic heart disease and other diseases of the circulatory system: Secondary | ICD-10-CM

## 2020-09-13 DIAGNOSIS — E119 Type 2 diabetes mellitus without complications: Secondary | ICD-10-CM | POA: Insufficient documentation

## 2020-09-13 DIAGNOSIS — Z87891 Personal history of nicotine dependence: Secondary | ICD-10-CM | POA: Insufficient documentation

## 2020-09-13 DIAGNOSIS — Z79899 Other long term (current) drug therapy: Secondary | ICD-10-CM | POA: Diagnosis not present

## 2020-09-13 DIAGNOSIS — Z8349 Family history of other endocrine, nutritional and metabolic diseases: Secondary | ICD-10-CM | POA: Diagnosis not present

## 2020-09-13 DIAGNOSIS — Z833 Family history of diabetes mellitus: Secondary | ICD-10-CM | POA: Diagnosis not present

## 2020-09-13 LAB — CBC WITH DIFFERENTIAL/PLATELET
Abs Immature Granulocytes: 0.02 10*3/uL (ref 0.00–0.07)
Basophils Absolute: 0.1 10*3/uL (ref 0.0–0.1)
Basophils Relative: 1 %
Eosinophils Absolute: 0.5 10*3/uL (ref 0.0–0.5)
Eosinophils Relative: 7 %
HCT: 39.6 % (ref 39.0–52.0)
Hemoglobin: 12.5 g/dL — ABNORMAL LOW (ref 13.0–17.0)
Immature Granulocytes: 0 %
Lymphocytes Relative: 26 %
Lymphs Abs: 1.8 10*3/uL (ref 0.7–4.0)
MCH: 30.2 pg (ref 26.0–34.0)
MCHC: 31.6 g/dL (ref 30.0–36.0)
MCV: 95.7 fL (ref 80.0–100.0)
Monocytes Absolute: 0.5 10*3/uL (ref 0.1–1.0)
Monocytes Relative: 7 %
Neutro Abs: 4.1 10*3/uL (ref 1.7–7.7)
Neutrophils Relative %: 59 %
Platelets: 353 10*3/uL (ref 150–400)
RBC: 4.14 MIL/uL — ABNORMAL LOW (ref 4.22–5.81)
RDW: 14.4 % (ref 11.5–15.5)
WBC: 7 10*3/uL (ref 4.0–10.5)
nRBC: 0 % (ref 0.0–0.2)

## 2020-09-13 LAB — COMPREHENSIVE METABOLIC PANEL
ALT: 14 U/L (ref 0–44)
AST: 20 U/L (ref 15–41)
Albumin: 3.6 g/dL (ref 3.5–5.0)
Alkaline Phosphatase: 69 U/L (ref 38–126)
Anion gap: 10 (ref 5–15)
BUN: 18 mg/dL (ref 8–23)
CO2: 26 mmol/L (ref 22–32)
Calcium: 8.8 mg/dL — ABNORMAL LOW (ref 8.9–10.3)
Chloride: 102 mmol/L (ref 98–111)
Creatinine, Ser: 1.19 mg/dL (ref 0.61–1.24)
GFR, Estimated: >60 mL/min (ref >60)
Glucose, Bld: 118 mg/dL — ABNORMAL HIGH (ref 70–99)
Potassium: 4.4 mmol/L (ref 3.5–5.1)
Sodium: 138 mmol/L (ref 135–145)
Total Bilirubin: 0.6 mg/dL (ref 0.3–1.2)
Total Protein: 7.2 g/dL (ref 6.5–8.1)

## 2020-09-13 MED ORDER — OXYCODONE-ACETAMINOPHEN 5-325 MG PO TABS: 1.0000 | ORAL_TABLET | Freq: Every day | ORAL | 0 refills | Status: DC

## 2020-09-13 NOTE — Telephone Encounter (Addendum)
Appointments given for PET scan and CT Simulation.   Mr. Miguel Bradley wrote appt.'s down with read back of time/date/location.   Instructions also given for PET scan, NPO 6 hours prior except water and low carb meal the night before.

## 2020-09-13 NOTE — Assessment & Plan Note (Addendum)
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 Stage: pT4aN3b-stage IVb.  HER-2 positive; androgen receptor positive.  PET scan pending.  Recommend checking NGS-as that might open options for targeted therapies if and when patient presents with distant site disease.  We will also discuss at tumor conference on 4/`14.   #I reviewed the pathology and stage with the patient and daughter in detail.  Await PET scan.  Multiple high risk features include-high-grade malignancy; T4 lesion; multiple lymph nodes; involvement of the nerve.   Discussed that patient has multiple high risk features as noted on the pathology-for which concurrent chemotherapy could be considered along with radiation.  However given patient's age/ III kidney disease/hearing loss-patient is extremely high risk of complications with systemic cisplatin based chemotherapy.  Await PET scan as above.  # CKD- stage III-  [1 BID; tylenol]; recommend avoiding Aleve; recommend Tylenol/oxycodone as needed.  # Thank you. for allowing me to participate in the care of your pleasant patient. Please do not hesitate to contact me with questions or concerns in the interim.  # DISPOSITION: call daughterBenjamine Bradley- [phone] # labs today- # follow up TBD- Dr.B

## 2020-09-13 NOTE — Consult Note (Signed)
NEW PATIENT EVALUATION  Name: Miguel Bradley  MRN: [id number]  Date:   09/13/2020     DOB: Apr 12, 1941   This 80 y.o. male patient presents to the clinic for initial evaluation of left parotid high-grade salivary gland carcinoma status post left thyroidectomy and left neck dissection  REFERRING PHYSICIAN: Jinny Sanders, MD  CHIEF COMPLAINT:  Chief Complaint  Patient presents with  . Cancer    Ref from [facility] for parotid    DIAGNOSIS: The encounter diagnosis was Cancer of parotid gland ([place]).   PREVIOUS INVESTIGATIONS:  CT scan reviewed PET CT scan ordered Clinical notes reviewed Pathology report reviewed  HPI: Patient is a 80 year old male who presented with a palpable mass in his left parotid gland.  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  He underwent a left thyroidectomy with sacrifice of the left facial nerve.  Tumor was present at the cauterized margin involving the deep surgical margin adjacent to skeletal muscle.  He also metastatic carcinoma with multiple lymph nodes positive with at least 5+ for extranodal extension.  11 of 26 lymph nodes were positive.  2 nodes positive for extranodal extension.  He has had eye surgery secondary to lack of 5 lid closing secondary to loss of facial nerve.  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  He is slowly recuperating from his surgery is now referred to radiation oncology as well as medical oncology for consideration of adjuvant treatment.  He is having no head or neck pain no dysphagia at this time.  PLANNED TREATMENT REGIMEN: Adjuvant head and neck radiation  PAST MEDICAL HISTORY:  has a past medical history of Anemia, Basal cell carcinoma of face, Cataract, Diabetes mellitus without complication ([place]), Glaucoma, Hypercalcemia, Hyperparathyroidism, unspecified ([place]), Impotence of organic origin, Other testicular  hypofunction, Pure hyperglyceridemia, Special screening for malignant neoplasm of prostate, Special screening for malignant neoplasms, colon, and Unspecified essential hypertension.    PAST SURGICAL HISTORY:  Past Surgical History:  Procedure Laterality Date  . COLONOSCOPY  06/13/2018   Dr. Lucio Edward  . LEFT HEART CATH AND CORONARY ANGIOGRAPHY Left 03/16/2020   Procedure: LEFT HEART CATH AND CORONARY ANGIOGRAPHY;  Surgeon: Nelva Bush, MD;  Location: [facility];  Service: Cardiovascular;  Laterality: Left;  . PARATHYROIDECTOMY  2012  . skin cancer removal     Dr. Sharlett Iles  . TRIGGER FINGER RELEASE Left 2008    FAMILY HISTORY: family history includes Bladder Cancer in his sister; Dementia in his mother; Diabetes in an other family member; Hyperlipidemia in his brother, brother, brother, and sister; Hypertension in his brother, brother, brother, mother, and sister.  SOCIAL HISTORY:  reports that he quit smoking about 42 years ago. He quit after 25.00 years of use. He has never used smokeless tobacco. He reports current alcohol use. He reports that he does not use drugs.  ALLERGIES: Patient has no known allergies.  MEDICATIONS:  Current Outpatient Medications  Medication Sig Dispense Refill  . acetaminophen (TYLENOL) 500 MG tablet Take 500 mg by mouth every 6 (six) hours as needed.    Marland Kitchen aspirin EC 81 MG tablet Take 81 mg by mouth daily.    . diphenhydramine-acetaminophen (TYLENOL PM) 25-500 MG TABS tablet Take 2 tablets by mouth at bedtime.    . dorzolamide-timolol (COSOPT) 22.3-6.8 MG/ML ophthalmic solution Place 1 drop into both eyes 2 (two) times daily.     Marland Kitchen  fenofibrate 160 MG tablet TAKE 1 TABLET BY MOUTH EVERY DAY 90 tablet 3  . ferrous sulfate 325 (65 FE) MG tablet Take 325 mg by mouth daily with breakfast.    . hydrochlorothiazide (HYDRODIURIL) 25 MG tablet TAKE 1 TABLET BY MOUTH EVERY DAY 90 tablet 3  . latanoprost (XALATAN) 0.005 % ophthalmic solution Place 1  drop into both eyes at bedtime.     . metFORMIN (GLUCOPHAGE) 500 MG tablet TAKE ONE TABLET BY MOUTH TWICE DAILY WITH A MEAL 180 tablet 0  . naproxen sodium (ALEVE) 220 MG tablet Take 220 mg by mouth 2 (two) times daily as needed.    . nitroGLYCERIN (NITROSTAT) 0.4 MG SL tablet Place 1 tablet (0.4 mg total) under the tongue every 5 (five) minutes as needed for chest pain. 25 tablet prn  . Omega-3 Fatty Acids (FISH OIL OMEGA-3 PO) Take 1 capsule by mouth daily.    . rosuvastatin (CRESTOR) 20 MG tablet TAKE 1 TABLET BY MOUTH EVERY DAY 90 tablet 3  . oxyCODONE-acetaminophen (PERCOCET/ROXICET) 5-325 MG tablet Take 1 tablet by mouth at bedtime. 30 tablet 0   No current facility-administered medications for this encounter.    ECOG PERFORMANCE STATUS:  1 - Symptomatic but completely ambulatory  REVIEW OF SYSTEMS: Patient denies any weight loss, fatigue, weakness, fever, chills or night sweats. Patient denies any loss of vision, blurred vision. Patient denies any ringing  of the ears or hearing loss. No irregular heartbeat. Patient denies heart murmur or history of fainting. Patient denies any chest pain or pain radiating to her upper extremities. Patient denies any shortness of breath, difficulty breathing at night, cough or hemoptysis. Patient denies any swelling in the lower legs. Patient denies any nausea vomiting, vomiting of blood, or coffee ground material in the vomitus. Patient denies any stomach pain. Patient states has had normal bowel movements no significant constipation or diarrhea. Patient denies any dysuria, hematuria or significant nocturia. Patient denies any problems walking, swelling in the joints or loss of balance. Patient denies any skin changes, loss of hair or loss of weight. Patient denies any excessive worrying or anxiety or significant depression. Patient denies any problems with insomnia. Patient denies excessive thirst, polyuria, polydipsia. Patient denies any swollen glands,  patient denies easy bruising or easy bleeding. Patient denies any recent infections, allergies or URI. Patient "s visual fields have not changed significantly in recent time.   PHYSICAL EXAM: BP (P) 140/69 (BP Location: Left Arm, Patient Position: Sitting)   Pulse (!) (P) 58   Temp (!) (P) 94.8 F (34.9 C) (Tympanic)   Resp (P) 18   Wt (P) 212 lb 4.8 oz (96.3 kg)   BMI (P) 30.90 kg/m  Patient status post left thyroidectomy incision is healing fairly well.  Does have some fullness in that region.  Also has left facial nerve paralysis.  No evidence of adenopathy in the neck or supraclavicular region is noted.  Well-developed well-nourished patient in NAD. HEENT reveals PERLA, EOMI, discs not visualized.  Oral cavity is clear. No oral mucosal lesions are identified. Neck is clear without evidence of cervical or supraclavicular adenopathy. Lungs are clear to A&P. Cardiac examination is essentially unremarkable with regular rate and rhythm without murmur rub or thrill. Abdomen is benign with no organomegaly or masses noted. Motor sensory and DTR levels are equal and symmetric in the upper and lower extremities. Cranial nerves II through XII are grossly intact. Proprioception is intact. No peripheral adenopathy or edema is identified. No motor or  sensory levels are noted. Crude visual fields are within normal range.  LABORATORY DATA: Pathology report reviewed    RADIOLOGY RESULTS: CT scan reviewed PET CT scan ordered   IMPRESSION: Locally advanced high-grade salivary gland carcinoma of the left parotid status post left thyroidectomy and left radical neck dissection in an 80 year old male  PLAN: This time of ordered a PET scan to complete his staging work-up as well as possibly delineate residual disease that may be remaining.  I would plan on delivering 60 Gray over 6 weeks to his head and neck region including his neck nodes.  Risks and benefits of treatment including skin reaction fatigue possible  dysphagia loss of taste alteration of blood counts and some lymphedema of the neck were all reviewed with the patient.  We will spare his right contralateral side and I would use IMRT to spare critical structures such as the salivary gland spinal cord oral cavity and esophagus.  I have personally set up and ordered CT simulation after the PET CT scan is performed.  Patient and daughter both comprehend my recommendations well.  I would like to take this opportunity to thank you for allowing me to participate in the care of your patient.Noreene Filbert, MD

## 2020-09-13 NOTE — Progress Notes (Signed)
[facility] NOTE  Patient Care Team: Jinny Sanders, MD as PCP - General End, Harrell Gave, MD as PCP - Cardiology (Cardiology) Hayden Pedro, MD as Consulting Physician (Ophthalmology) Lorelee Cover., MD as Referring Physician (Ophthalmology) Oneta Rack, MD as Referring Physician (Dermatology) Ladene Artist, MD as Consulting Physician (Gastroenterology) Rosalene Billings., MD as Referring Physician (Dentistry) Debbora Dus, [facility] as Pharmacist (Pharmacist)  CHIEF COMPLAINTS/PURPOSE OF CONSULTATION: parotid cancer  #  Oncology History Overview Note  Addendum    Upon clinician request, additional immunostains are performed at [facility] on block F2, with the following results in the tumor cells:   HER2 ([place], clone [id number]) Interpretation: Positive IHC Score: 3+ (using the ASCO/CAP breast biomarker scoring guidelines; there are no consensus guideline for scoring HER2 in salivary duct carcinoma)   This slide has also been reviewed by Dr. Shelby Mattocks [facility] breast pathology) who concurs.  Addendum electronically signed by Tomasa Blase, MD on 09/03/2020 at  3:21 PM  Diagnosis    A. "Tissue around stylomastoid foramen"  - Fibrovascular tissue extensively involved by carcinoma   B. "Main trunk facial nerve"  - Nerve and fibrous tissue extensively involved by carcinoma     C. "Distal facial nerve" - Nerve tissue; negative for carcinoma     D. "Proximal mastoid nerve margin" - Nerve tissue; negative for carcinoma   E: Left upper superficial parotid, parotidectomy - Benign parotid parenchyma with focal acute and chronic inflammation - Negative for carcinoma   F: Left inferior parotid with sternocleidomastoid muscle, resection - High-grade salivary gland carcinoma, most consistent with salivary duct carcinoma (see comment and synoptic report for further details) - Cauterized tumor present at inked deep surgical margin within fibrous tissue  adjacent to skeletal muscle - Metastatic carcinoma in 1.1 cm nodal conglomerate and 4 of 4 intact lymph nodes (at least 5/5); positive for extranodal extension    G: Lymph node, left neck level 5, lymphadenectomy - Benign adipose; no lymph nodes or carcinoma identified   H: Lymph node, left neck level 1, lymphadenectomy - Metastatic carcinoma in 1 of 5 lymph nodes (1/5); maximum size of metastasis 2 mm; negative for extranodal extension.   I: Left omohyoid, excision - Benign skeletal muscle; negative for carcinoma   J: Left deep lobe parotid, excision - Benign parotid tissue; negative for carcinoma   K: Parotid tissue, biopsy - Benign parotid tissue - No metastatic carcinoma identified in 1 lymph nodes (0/1)   L: Left tissue over mastoid, biopsy - Benign parotid parenchyma, dense fibrous tissue, and bone; negative for carcinoma   M: "Stylomastoid foramen stitch on proximal nerve", excision - Large nerve, negative for perineural/intraneural carcinoma; stitched margin negative - Attached fibrous tissue and parotid parenchyma positive for carcinoma, focally invading around but not within large nerve segment, with crushed/cauterized tumor present at tissue edges   N: Tissue over styloid process, biopsy - Benign parotid tissue; negative for carcinoma   O: Posterior digastric margin, biopsy - Benign skeletal muscle; negative for carcinoma   P: Left neck dissections level 1 through 5, lymphadenectomy - Metastatic carcinoma in 11 of 26 lymph nodes (11/26); maximum size of metastasis 20 mm; two nodes (16 mm and 14 mm) positive for extranodal extension (ENEma, up to 4 mm beyond capsule)   Diagnosis: 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 -----------------------------------------------------------------   07/13/10: Right parathyroidectomy Pathology: Hypercellular parathyroid gland    # MARCH 2022- CT chest- [[facility]] Subcentimeter pulmonary nodules measuring up to 0.8 cm, indeterminate. No comparison available. Attention on follow-up is recommended.  # CAD [card]; DM- 2; CKD-III   Cancer of parotid gland (HCC)  09/11/2020 Initial Diagnosis   Cancer of parotid gland (HCC)   09/16/2020 Cancer Staging   Staging form: Major Salivary Glands, AJCC 8th Edition - Pathologic: Stage IVB (pT4a, pN3b, cM0) - Signed by Earna Coder, MD on 09/16/2020      HISTORY OF PRESENTING ILLNESS:  Miguel Bradley 80 y.o.  male has been referred to [place] for evaluation and recommendations for his recently diagnosed left parotid cancer.  Patient noted to have a lump in the left parotid area which led to further evaluation with PCP followed by ENT evaluation.  Patient was evaluated by Dr. Georgina Quint subsequently evaluated by ENT at [facility].  07/22/20 CT neck demonstrated left parotid gland lesion with internal calcification and scattered hypoattenuation, concern for salivary neoplasm. Enlarged left level 2A and 3 lymph node with subcentimeter asymmetric left neck nodes suspicious for metastatic nodes. Chronic appearing occlusion of the left cervical and petrous internal carotid artery with reconstitution at this cavernous sinus.  Patient underwent left parotidectomy/with lymph node dissection.  Patient has recovered fairly well from surgery; denies any significant pain.  Patient has facial droop post surgery.  And also has implant in his left eyelid given the facial nerve palsy.   Review of Systems  Constitutional: Negative for chills, diaphoresis, fever, malaise/fatigue and weight loss.  HENT: Negative for nosebleeds and sore throat.   Eyes: Negative for double vision.  Respiratory: Negative for cough,  hemoptysis, sputum production, shortness of breath and wheezing.   Cardiovascular: Negative for chest pain, palpitations, orthopnea and leg swelling.  Gastrointestinal: Negative for abdominal pain, blood in stool, constipation, diarrhea, heartburn, melena, nausea and vomiting.  Genitourinary: Negative for dysuria, frequency and urgency.  Musculoskeletal: Positive for joint pain. Negative for back pain.  Skin: Negative.  Negative for itching and rash.  Neurological: Negative for dizziness, tingling, focal weakness, weakness and headaches.  Endo/Heme/Allergies: Does not bruise/bleed easily.  Psychiatric/Behavioral: Negative for depression. The patient is not nervous/anxious and does not have insomnia.      MEDICAL HISTORY:  Past Medical History:  Diagnosis Date  . Anemia   . Basal cell carcinoma of face   . Cataract   . Diabetes mellitus without complication (HCC)   . Glaucoma   . Hypercalcemia   . Hyperparathyroidism, unspecified (HCC)   . Impotence of organic origin   . Other testicular hypofunction   . Pure hyperglyceridemia   . Special screening for malignant neoplasm of prostate   . Special screening for malignant neoplasms, colon   . Unspecified essential hypertension     SURGICAL HISTORY: Past Surgical History:  Procedure Laterality Date  . COLONOSCOPY  06/13/2018   Dr. Claudette Head  . LEFT HEART CATH AND CORONARY ANGIOGRAPHY Left 03/16/2020   Procedure: LEFT HEART CATH AND CORONARY ANGIOGRAPHY;  Surgeon: Yvonne Kendall, MD;  Location: [facility];  Service: Cardiovascular;  Laterality: Left;  . PARATHYROIDECTOMY  2012  . skin cancer removal     Dr. Jarold Motto  . TRIGGER FINGER RELEASE Left 2008    SOCIAL HISTORY: Social History   Socioeconomic History  . Marital status: Married    Spouse  name: Not on file  . Number of children: 1  . Years of education: Not on file  . Highest education level: Not on file  Occupational History  . Occupation:  Water quality scientist at [employer]  . Smoking status: Former Smoker    Years: 25.00    Quit date: 06/12/1978    Years since quitting: 42.2  . Smokeless tobacco: Never Used  Vaping Use  . Vaping Use: Never used  Substance and Sexual Activity  . Alcohol use: Yes    Comment: 1 beer every few weeks  . Drug use: No  . Sexual activity: Not Currently  Other Topics Concern  . Not on file  Social History Narrative   No exercise, plays golf. Lives in [place]; self- [place]; daughter. 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 Rare alcohol. Quit smoking in 1980.    Social Determinants of Health   Financial Resource Strain: Not on file  Food Insecurity: Not on file  Transportation Needs: Not on file  Physical Activity: Not on file  Stress: Not on file  Social Connections: Not on file  Intimate Partner Violence: Not on file    FAMILY HISTORY: Family History  Problem Relation Age of Onset  . Hypertension Mother   . Dementia Mother   . Diabetes Other        aunt  . Hypertension Brother   . Hypertension Brother   . Hypertension Brother   . Hyperlipidemia Brother   . Hyperlipidemia Brother   . Hyperlipidemia Brother   . Hypertension Sister   . Bladder Cancer Sister   . Hyperlipidemia Sister   . Colon cancer Neg Hx   . Esophageal cancer Neg Hx   . Rectal cancer Neg Hx   . Stomach cancer Neg Hx     ALLERGIES:  has No Known Allergies.  MEDICATIONS:  Current Outpatient Medications  Medication Sig Dispense Refill  . oxyCODONE-acetaminophen (PERCOCET/ROXICET) 5-325 MG tablet Take 1 tablet by mouth at bedtime. 30 tablet 0  . acetaminophen (TYLENOL) 500 MG tablet Take 500 mg by mouth every 6 (six) hours as needed.    Marland Kitchen aspirin EC 81 MG tablet Take 81 mg by mouth daily.    . diphenhydramine-acetaminophen (TYLENOL PM) 25-500 MG TABS tablet Take 2 tablets by mouth at bedtime.    . dorzolamide-timolol (COSOPT) 22.3-6.8 MG/ML ophthalmic solution Place 1 drop into both eyes 2 (two) times daily.     .  fenofibrate 160 MG tablet TAKE 1 TABLET BY MOUTH EVERY DAY 90 tablet 3  . ferrous sulfate 325 (65 FE) MG tablet Take 325 mg by mouth daily with breakfast.    . hydrochlorothiazide (HYDRODIURIL) 25 MG tablet TAKE 1 TABLET BY MOUTH EVERY DAY 90 tablet 3  . latanoprost (XALATAN) 0.005 % ophthalmic solution Place 1 drop into both eyes at bedtime.     . metFORMIN (GLUCOPHAGE) 500 MG tablet TAKE ONE TABLET BY MOUTH TWICE DAILY WITH A MEAL 180 tablet 0  . naproxen sodium (ALEVE) 220 MG tablet Take 220 mg by mouth 2 (two) times daily as needed.    . nitroGLYCERIN (NITROSTAT) 0.4 MG SL tablet Place 1 tablet (0.4 mg total) under the tongue every 5 (five) minutes as needed for chest pain. 25 tablet prn  . Omega-3 Fatty Acids (FISH OIL OMEGA-3 PO) Take 1 capsule by mouth daily.    . rosuvastatin (CRESTOR) 20 MG tablet TAKE 1 TABLET BY MOUTH EVERY DAY 90 tablet 3   No current facility-administered medications for this visit.      Marland Kitchen  PHYSICAL EXAMINATION: ECOG PERFORMANCE STATUS: 0 - Asymptomatic  Vitals:   09/13/20 1116  BP: 140/69  Pulse: (!) 58  Resp: 18  Temp: (!) 95 F (35 C)  SpO2: 98%   Filed Weights   09/13/20 1116  Weight: 212 lb (96.2 kg)    Physical Exam Constitutional:      Comments: Ambulating dependently.  Accompanied by his daughter.  HENT:     Head: Normocephalic and atraumatic.     Mouth/Throat:     Pharynx: No oropharyngeal exudate.  Eyes:     Pupils: Pupils are equal, round, and reactive to light.  Neck:     Comments: Incision from the left neck dissection; parotidectomy noted.  Well-healing.  No evidence of any infection. Cardiovascular:     Rate and Rhythm: Normal rate and regular rhythm.  Pulmonary:     Effort: No respiratory distress.     Breath sounds: No wheezing.  Abdominal:     General: Bowel sounds are normal. There is no distension.     Palpations: Abdomen is soft. There is no mass.     Tenderness: There is no abdominal tenderness. There is no  guarding or rebound.  Musculoskeletal:        General: No tenderness. Normal range of motion.     Cervical back: Normal range of motion and neck supple.  Skin:    General: Skin is warm.  Neurological:     Mental Status: He is alert and oriented to person, place, and time.     Comments: Left facial droop noted-post surgery.  Psychiatric:        Mood and Affect: Affect normal.      LABORATORY DATA:  I have reviewed the data as listed Lab Results  Component Value Date   WBC 7.0 09/13/2020   HGB 12.5 (L) 09/13/2020   HCT 39.6 09/13/2020   MCV 95.7 09/13/2020   PLT 353 09/13/2020   Recent Labs    12/26/19 0757 02/23/20 1201 03/10/20 1636 07/01/20 0748 09/13/20 1208  NA 135 138 139 136 138  K 3.6 4.3 3.8 4.4 4.4  CL 102 101 104 101 102  CO2 $Re'24 24 24 29 26  'ZRY$ GLUCOSE 160* 122* 108* 131* 118*  BUN $Re'20 17 17 17 18  'PoM$ CREATININE 1.25 1.13 1.31* 1.39 1.19  CALCIUM 9.0 9.8 9.6 9.3 8.8*  GFRNONAA  --  62 52*  --  >60  GFRAA  --  72 >60  --   --   PROT 6.3  --   --  7.0 7.2  ALBUMIN 4.0  --   --  4.1 3.6  AST 17  --   --  16 20  ALT 13  --   --  11 14  ALKPHOS 56  --   --  55 69  BILITOT 0.6  --   --  0.7 0.6    RADIOGRAPHIC STUDIES: I have personally reviewed the radiological images as listed and agreed with the findings in the report. No results found.  ASSESSMENT & PLAN:   Cancer of parotid gland ([place]) # Salivary duct carcinoma of the left parotid gland- Left total parotidectomy with facial nerve sacrifice, lateral temporal bone resection, and reconstruction with left anterolateral thigh free flap Pathology: 4.2 cm salivary duct carcinoma, positive LVI/PNI, 17/36 LN involved with ENE present. Stage: pT4aN3b-stage IVb.  HER-2 positive; androgen receptor positive.  PET scan pending.  Recommend checking NGS-as that might open options for targeted therapies if and when patient presents  with distant site disease.  We will also discuss at tumor conference on 4/`14.   #I  reviewed the pathology and stage with the patient and daughter in detail.  Await PET scan.  Multiple high risk features include-high-grade malignancy; T4 lesion; multiple lymph nodes; involvement of the nerve.   Discussed that patient has multiple high risk features as noted on the pathology-for which concurrent chemotherapy could be considered along with radiation.  However given patient's age/ III kidney disease/hearing loss-patient is extremely high risk of complications with systemic cisplatin based chemotherapy.  Await PET scan as above.  # CKD- stage III-  [1 BID; tylenol]; recommend avoiding Aleve; recommend Tylenol/oxycodone as needed.  # Thank you. for allowing me to participate in the care of your pleasant patient. Please do not hesitate to contact me with questions or concerns in the interim.  # DISPOSITION: call daughterBenjamine Mola- [phone] # labs today- # follow up TBD- Dr.B  All questions were answered. The patient knows to call the clinic with any problems, questions or concerns.    Cammie Sickle, MD 09/16/2020 8:52 AM

## 2020-09-16 ENCOUNTER — Telehealth: Payer: Self-pay

## 2020-09-16 NOTE — Chronic Care Management (AMB) (Addendum)
Chronic Care Management Pharmacy Assistant   Name: Miguel Bradley  MRN: [id number] DOB: Apr 26, 1941  Reason for Encounter:  CCM  Initial Questions    Conditions to be addressed/monitored: HTN, HLD and DMII  Recent office visits:  07/09/2020  Dr.Amy Bedsole  PCP/AWV - CT ordered soft tissue neck, DM foot exam  Recent consult visits:  09/13/2020  Beasley Brahmanday   Oncology    Started Oxycodone 5/325  1  QHS. 09/08/2020  Dr.Matthew Sabra Heck        Otolaryngology 3/30-/2022  Draper   Oncology 08/13/2020  Dr.Jeffery Blumberg   Oncology 06/21/2020  Marrianne Mood, PA-C   Cardiology 05/14/2020  Karle Barr     Dermatology 03/30/2020  Lorie Apley    Optometry 03/22/2020  Dr.Christopher End   Cardiology 03/16/2020  Dr.Christopher End   Cardiology Cardiac Cath.  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 Discharge date was 09/01/2020. Discharged from [facility].    Medications: Outpatient Encounter Medications as of 09/16/2020  Medication Sig   acetaminophen (TYLENOL) 500 MG tablet Take 500 mg by mouth every 6 (six) hours as needed.   aspirin EC 81 MG tablet Take 81 mg by mouth daily.   diphenhydramine-acetaminophen (TYLENOL PM) 25-500 MG TABS tablet Take 2 tablets by mouth at bedtime.   dorzolamide-timolol (COSOPT) 22.3-6.8 MG/ML ophthalmic solution Place 1 drop into both eyes 2 (two) times daily.    fenofibrate 160 MG tablet TAKE 1 TABLET BY MOUTH EVERY DAY   ferrous sulfate 325 (65 FE) MG tablet Take 325 mg by mouth daily with breakfast.   hydrochlorothiazide (HYDRODIURIL) 25 MG tablet TAKE 1 TABLET BY MOUTH EVERY DAY   latanoprost (XALATAN) 0.005 % ophthalmic solution Place 1 drop into both eyes at bedtime.    metFORMIN (GLUCOPHAGE) 500 MG tablet TAKE ONE TABLET BY MOUTH TWICE DAILY WITH A MEAL   naproxen sodium (ALEVE) 220 MG tablet Take 220 mg by mouth 2 (two) times daily as needed.   nitroGLYCERIN (NITROSTAT) 0.4 MG SL  tablet Place 1 tablet (0.4 mg total) under the tongue every 5 (five) minutes as needed for chest pain.   Omega-3 Fatty Acids (FISH OIL OMEGA-3 PO) Take 1 capsule by mouth daily.   oxyCODONE-acetaminophen (PERCOCET/ROXICET) 5-325 MG tablet Take 1 tablet by mouth at bedtime.   rosuvastatin (CRESTOR) 20 MG tablet TAKE 1 TABLET BY MOUTH EVERY DAY   No facility-administered encounter medications on file as of 09/16/2020.    Lab Results  Component Value Date/Time   HGBA1C 6.6 (H) 07/01/2020 07:48 AM   HGBA1C 6.9 (H) 12/26/2019 07:57 AM   MICROALBUR 1.2 12/26/2019 08:11 AM   MICROALBUR 0.9 12/24/2018 08:05 AM     BP Readings from Last 3 Encounters:  09/13/20 (P) 140/69  09/13/20 140/69  07/09/20 130/70   Have you seen any other providers since your last visit with PCP? Yes   Any changes in your medications or health? Yes   The patient continues to recover from facial nerve surgery at [facility],  08/24/2020  Dr.Jeffrey Blumberg  Any side effects from any medications? No  Do you have an symptoms or problems not managed by your medications? No  Any concerns about your health right now? No  Has your provider asked that you check blood pressure, blood sugar, or follow special diet at home? Yes The patient continues to have a soft food and liquid diet due to the recent facial nerve surgery.  Do you  get any type of exercise on a regular basis? No  Can you think of a goal you would like to reach for your health? No  Do you have any problems getting your medications? No  Is there anything that you would like to discuss during the appointment? No   Miguel Bradley was reminded to have all medications, supplements and any blood glucose and blood pressure readings available for review with Debbora Dus, Pharm. D, at his telephone visit on 09/23/2020 at 11:00am   Star Rating Drugs:  Medication:  Last Fill: Day Supply Metformin 500 mg 07/06/2020 90ds Rosuvastatin 20 mg 09/03/2020 90ds  Follow-Up:   Pharmacist Review  Debbora Dus, CPP notified  Avel Sensor [place] Assistant [phone]  I have reviewed the care management and care coordination activities outlined in this encounter and I am certifying that I agree with the content of this note. No further action required.  Debbora Dus, PharmD Clinical Pharmacist [facility] [phone]

## 2020-09-22 ENCOUNTER — Other Ambulatory Visit: Payer: Self-pay

## 2020-09-22 ENCOUNTER — Encounter
Admission: RE | Admit: 2020-09-22 | Discharge: 2020-09-22 | Disposition: A | Discharge status: Home / Self Care | Payer: PPO | Source: Ambulatory Visit | Attending: Radiation Oncology | Admitting: Radiation Oncology

## 2020-09-22 DIAGNOSIS — I517 Cardiomegaly: Secondary | ICD-10-CM | POA: Insufficient documentation

## 2020-09-22 DIAGNOSIS — J320 Chronic maxillary sinusitis: Secondary | ICD-10-CM | POA: Insufficient documentation

## 2020-09-22 DIAGNOSIS — I2510 Atherosclerotic heart disease of native coronary artery without angina pectoris: Secondary | ICD-10-CM | POA: Diagnosis not present

## 2020-09-22 DIAGNOSIS — C07 Malignant neoplasm of parotid gland: Secondary | ICD-10-CM | POA: Diagnosis not present

## 2020-09-22 DIAGNOSIS — K760 Fatty (change of) liver, not elsewhere classified: Secondary | ICD-10-CM | POA: Insufficient documentation

## 2020-09-22 LAB — GLUCOSE, CAPILLARY: Glucose-Capillary: 125 mg/dL — ABNORMAL HIGH (ref 70–99)

## 2020-09-22 MED ORDER — FLUDEOXYGLUCOSE F - 18 (FDG) INJECTION
11.0000 | Freq: Once | INTRAVENOUS | Status: AC | PRN
  Administered 2020-09-22: 11.638 via INTRAVENOUS

## 2020-09-23 ENCOUNTER — Ambulatory Visit (INDEPENDENT_AMBULATORY_CARE_PROVIDER_SITE_OTHER): Payer: PPO

## 2020-09-23 ENCOUNTER — Telehealth: Payer: Self-pay | Admitting: Internal Medicine

## 2020-09-23 ENCOUNTER — Other Ambulatory Visit: Payer: PPO

## 2020-09-23 DIAGNOSIS — E7800 Pure hypercholesterolemia, unspecified: Secondary | ICD-10-CM | POA: Diagnosis not present

## 2020-09-23 DIAGNOSIS — I10 Essential (primary) hypertension: Secondary | ICD-10-CM | POA: Diagnosis not present

## 2020-09-23 DIAGNOSIS — E11319 Type 2 diabetes mellitus with unspecified diabetic retinopathy without macular edema: Secondary | ICD-10-CM | POA: Diagnosis not present

## 2020-09-23 NOTE — Telephone Encounter (Signed)
On 4/14- discussed at tumor conference. Recommend adjuvant therapy- RT plus chemo [if pt can tolerate].  Left voice mail for daughter, Benjamine Mola.  Please schedule appt- MD; no labs- next week 4/21.   Thanks GB

## 2020-09-23 NOTE — Progress Notes (Signed)
Tumor Board Documentation  [name] was presented by Dr Rogue Bussing at our Tumor Board on 09/23/2020, which included representatives from medical oncology,radiation oncology,internal medicine,navigation,pathology,radiology,surgical,pharmacy,genetics,research,palliative care,pulmonology.  Miguel Bradley currently presents as a new patient,for MDC,for new positive pathology with history of the following treatments: surgical intervention(s).  Additionally, we reviewed previous medical and familial history, history of present illness, and recent lab results along with all available histopathologic and imaging studies. The tumor board considered available treatment options and made the following recommendations: Adjuvant radiation    The following procedures/referrals were also placed: No orders of the defined types were placed in this encounter.   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  Tumor board is a meeting of clinicians from various specialty areas who evaluate and discuss patients for whom a multidisciplinary approach is being considered. Final determinations in the plan of care are those of the provider(s). The responsibility for follow up of recommendations given during tumor board is that of the provider.   Today's extended care, comprehensive team conference, [place] was not present for the discussion and was not examined.   Multidisciplinary Tumor Board is a multidisciplinary case peer review process.  Decisions discussed in the Multidisciplinary Tumor Board reflect the opinions of the specialists present at the conference without having examined the patient.  Ultimately, treatment and diagnostic decisions rest with the primary provider(s) and the patient.

## 2020-09-23 NOTE — Progress Notes (Signed)
Encounter details: CCM Time Spent       Value Time User   Time spent with patient (minutes)  50 09/23/2020  2:12 PM Debbora Dus, [facility]   Time spent performing Chart review  30 09/23/2020  2:12 PM Debbora Dus, [facility]   Total time (minutes)  80 09/23/2020  2:12 PM Debbora Dus, RPH      Moderate to High Complex Decision Making       Value Time User   Moderate to High complex decision making  Yes 09/23/2020  2:12 PM Debbora Dus, [facility]      CCM Services: This encounter meets complex CCM services and moderate to high decision making.  Prior to outreach and patient consent for Chronic Care Management, I referred this patient for services after reviewing the nominated patient list or from a personal encounter with the patient.  I have personally reviewed this encounter including the documentation in this note and have collaborated with the care management provider regarding care management and care coordination activities to include development and update of the comprehensive care plan. I am certifying that I agree with the content of this note and encounter as supervising physician.

## 2020-09-23 NOTE — Patient Instructions (Addendum)
September 23, 2020  Dear Miguel Bradley,  It was a pleasure meeting you during our initial appointment on September 23, 2020. Below is a summary of the goals we discussed and components of chronic care management. Please contact me anytime with questions or concerns.   Visit Information  Patient Care Plan: CCM Pharmacy Care Plan    Problem Identified: CHL AMB "PATIENT-SPECIFIC PROBLEM"     Long-Range Goal: Patient Stated   Start Date: 09/23/2020  Priority: High  Note:   Current Barriers:  . None identified  Pharmacist Clinical Goal(s):  Marland Kitchen Patient will contact provider office for questions/concerns as evidenced notation of same in electronic health record through collaboration with PharmD and provider.   Interventions: . 1:1 collaboration with Jinny Sanders, MD regarding development and update of comprehensive plan of care as evidenced by provider attestation and co-signature . Inter-disciplinary care team collaboration (see longitudinal plan of care) . Comprehensive medication review performed; medication list updated in electronic medical record  Hypertension (BP goal <140/90) -Controlled - clinic bp within goal -Current treatment: . HCTZ 25 mg - 1 tablet daily -Medications previously tried: none  -Checks home BP occasionally, purchased new BP machine with arm cuff -Current home readings: purchased new BP machine with arm cuff, usually good per machine, 130s/(unknown) -Current dietary habits: tries to avoid carbs/sugar -Current exercise habits: minimal, just rocking on front porch chair, recent surgery on eye  -Denies hypotensive/hypertensive symptoms  -Denies any falls -Tries to stay hydrated, drinks 20 ounces 2-3 times per day -Educated on Symptoms of hypotension and importance of maintaining adequate hydration; -Counseled to monitor BP at home monthly, document, and provide log at future appointments -Recommended to continue current medication  Hyperlipidemia: (LDL goal <  70) -Controlled LDL 69 -Current treatment: . Rosuvastatin 20 mg - 1 tablet daily . Fenofibrate 160 mg - 1 tablet daily . Fish oil 1200 mg - 1 capsule daily . Aspirin 81 mg - 1 tablet daily (evening) . Nitroglycerin 0.4 mg SL - as needed  -Medications previously tried: none reported -Educated on Cholesterol goals;  -Recommended to continue current medication  Diabetes (A1c goal <7%) -Controlled A1c 6.6 -Current medications: Marland Kitchen Metformin 500 mg - 1 tablet twice daily with meals  -Medications previously tried: none  -Current home glucose readings - checks every other day before breakfast . fasting glucose: 140, 112, 116, 160 avg - high 120s-130 (depending on meals) . post prandial glucose: none -Denies hypoglycemic/hyperglycemic symptoms -Current meal patterns: good appetite, difficulty eating since facial surgery, following soft diet right now  . breakfast: 9-10 AM . lunch: 2-4 PM, meat and cheese sandwich . dinner: light dinner . snacks: none . drinks: water  -Educated on A1c and blood sugar goals; -Counseled to check feet daily and get yearly eye exams - up to date -Recommended to continue current medication   Pain medication: Tylenol PM - no longer taking, stopped once he started oxycodone Tylenol 500 mg - uses PRN pain prior to trying the oxycodone/acetaminophen  Aleve 220 mg - no longer taking, patient was told to D/C Percocet - 1 tablet at bedtime, using since facial surgery 03/21 -- Denies any sharp pain even after surgery, pain scale has been < 5  -- Has been able to maintain pain control with Tylenol primarily Pt reports difficulty sleeping due to facial surgery, difficult to find comfortable position. He can only lie on his right side of face.  Reports this is improving.  Patient Goals/Self-Care Activities . Patient will:  -  take medications as prescribed  Follow Up Plan: Telephone follow up appointment with care management team member scheduled for: 6-9  months  Mail Care Plan     Miguel Bradley was given information about Chronic Care Management services today including:  1. CCM service includes personalized support from designated clinical staff supervised by his physician, including individualized plan of care and coordination with other care providers 2. 24/7 contact phone numbers for assistance for urgent and routine care needs. 3. Standard insurance, coinsurance, copays and deductibles apply for chronic care management only during months in which we provide at least 20 minutes of these services. Most insurances cover these services at 100%, however patients may be responsible for any copay, coinsurance and/or deductible if applicable. This service may help you avoid the need for more expensive face-to-face services. 4. Only one practitioner may furnish and bill the service in a calendar month. 5. The patient may stop CCM services at any time (effective at the end of the month) by phone call to the office staff.  Patient agreed to services and verbal consent obtained.   The patient verbalized understanding of instructions, educational materials, and care plan provided today and agreed to receive a mailed copy of patient instructions, educational materials, and care plan.   Debbora Dus, PharmD Clinical Pharmacist [facility] [phone]   Diabetes Mellitus and [employer] care is an important part of your health, especially when you have diabetes. Diabetes may cause you to have problems because of poor blood flow (circulation) to your feet and legs, which can cause your skin to:  Become thinner and drier.  Break more easily.  Heal more slowly.  Peel and crack. You may also have nerve damage (neuropathy) in your legs and feet, causing decreased feeling in them. This means that you may not notice minor injuries to your feet that could lead to more serious problems. Noticing and addressing any potential  problems early is the best way to prevent future foot problems. How to care for your feet Foot hygiene  Wash your feet daily with warm water and mild soap. Do not use hot water. Then, pat your feet and the areas between your toes until they are completely dry. Do not soak your feet as this can dry your skin.  Trim your toenails straight across. Do not dig under them or around the cuticle. File the edges of your nails with an emery board or nail file.  Apply a moisturizing lotion or petroleum jelly to the skin on your feet and to dry, brittle toenails. Use lotion that does not contain alcohol and is unscented. Do not apply lotion between your toes.   Shoes and socks  Wear clean socks or stockings every day. Make sure they are not too tight. Do not wear knee-high stockings since they may decrease blood flow to your legs.  Wear shoes that fit properly and have enough cushioning. Always look in your shoes before you put them on to be sure there are no objects inside.  To break in new shoes, wear them for just a few hours a day. This prevents injuries on your feet. Wounds, scrapes, corns, and calluses  Check your feet daily for blisters, cuts, bruises, sores, and redness. If you cannot see the bottom of your feet, use a mirror or ask someone for help.  Do not cut corns or calluses or try to remove them with medicine.  If you find a minor scrape, cut, or  break in the skin on your feet, keep it and the skin around it clean and dry. You may clean these areas with mild soap and water. Do not clean the area with peroxide, alcohol, or iodine.  If you have a wound, scrape, corn, or callus on your foot, look at it several times a day to make sure it is healing and not infected. Check for: ? Redness, swelling, or pain. ? Fluid or blood. ? Warmth. ? Pus or a bad smell.   General tips  Do not cross your legs. This may decrease blood flow to your feet.  Do not use heating pads or hot water bottles on  your feet. They may burn your skin. If you have lost feeling in your feet or legs, you may not know this is happening until it is too late.  Protect your feet from hot and cold by wearing shoes, such as at the beach or on hot pavement.  Schedule a complete foot exam at least once a year (annually) or more often if you have foot problems. Report any cuts, sores, or bruises to your health care provider immediately. Where to find more information  American Diabetes Association: www.diabetes.org  Association of Diabetes Care & Education Specialists: www.diabeteseducator.org Contact a health care provider if:  You have a medical condition that increases your risk of infection and you have any cuts, sores, or bruises on your feet.  You have an injury that is not healing.  You have redness on your legs or feet.  You feel burning or tingling in your legs or feet.  You have pain or cramps in your legs and feet.  Your legs or feet are numb.  Your feet always feel cold.  You have pain around any toenails. Get help right away if:  You have a wound, scrape, corn, or callus on your foot and: ? You have pain, swelling, or redness that gets worse. ? You have fluid or blood coming from the wound, scrape, corn, or callus. ? Your wound, scrape, corn, or callus feels warm to the touch. ? You have pus or a bad smell coming from the wound, scrape, corn, or callus. ? You have a fever. ? You have a red line going up your leg. Summary  Check your feet every day for blisters, cuts, bruises, sores, and redness.  Apply a moisturizing lotion or petroleum jelly to the skin on your feet and to dry, brittle toenails.  Wear shoes that fit properly and have enough cushioning.  If you have foot problems, report any cuts, sores, or bruises to your health care provider immediately.  Schedule a complete foot exam at least once a year (annually) or more often if you have foot problems. This information is  not intended to replace advice given to you by your health care provider. Make sure you discuss any questions you have with your health care provider. Document Revised: 12/18/2019 Document Reviewed: 12/18/2019 Elsevier Patient Education  [place].

## 2020-09-23 NOTE — Progress Notes (Signed)
Chronic Care Management Pharmacy Note  09/23/2020 Name:  Miguel Bradley MRN:  [id number] DOB:  09/28/1940  Subjective: Miguel Bradley is an 80 y.o. year old male who is a primary patient of Bedsole, Amy E, MD.  The CCM team was consulted for assistance with disease management and care coordination needs.    Engaged with patient by telephone for initial visit in response to provider referral for pharmacy case management and/or care coordination services. Patient reports primary health concern - starting cancer treatment 5 days per week for 6 weeks next Monday. He is concerned about whether it will be effective as well as side effects.  Consent to Services:  The patient was given the following information about Chronic Care Management services today, agreed to services, and gave verbal consent: 1. CCM service includes personalized support from designated clinical staff supervised by the primary care provider, including individualized plan of care and coordination with other care providers 2. 24/7 contact phone numbers for assistance for urgent and routine care needs. 3. Service will only be billed when office clinical staff spend 20 minutes or more in a month to coordinate care. 4. Only one practitioner may furnish and bill the service in a calendar month. 5.The patient may stop CCM services at any time (effective at the end of the month) by phone call to the office staff. 6. The patient will be responsible for cost sharing (co-pay) of up to 20% of the service fee (after annual deductible is met). Patient agreed to services and consent obtained.  Patient Care Team: Jinny Sanders, MD as PCP - General End, Harrell Gave, MD as PCP - Cardiology (Cardiology) Hayden Pedro, MD as Consulting Physician (Ophthalmology) Lorelee Cover., MD as Referring Physician (Ophthalmology) Oneta Rack, MD as Referring Physician (Dermatology) Ladene Artist, MD as Consulting Physician  (Gastroenterology) Rosalene Billings., MD as Referring Physician (Dentistry) Debbora Dus, [facility] as Pharmacist (Pharmacist)  Recent office visits: 07/09/2020  Jeannie Done  PCP/AWV - CT ordered soft tissue neck,  DM foot exam completed. Start fiber supplement for constipation. Increase water intake. MiraLAX PRN  Recent consult visits:  09/13/2020  Dr.Govinda Brahmanday    Oncology    Started Oxycodone 5/325  1  QHS. 09/08/2020  Dr.Matthew Sabra Heck               Otolaryngology 3/30-2022  [place]          Oncology 08/13/2020  Dr.Jeffery Blumberg             Oncology 06/21/2020  Marrianne Mood, PA-C      Cardiology 05/14/2020  Karle Barr                      Dermatology 03/30/2020  Lorie Apley                         Optometry 03/22/2020  Dr.Christopher End           Cardiology 03/16/2020  Dr.Christopher End             Cardiology     Cardiac Cath.  Hospital visits: 08/24/20 - Oncology surgery - Post-operative pain: Tylenol, Lyrica, Toradol x48h, PRN Oxy 5-10. Zofran prn nausea, bowel regimen, pepcid.  Objective:  Lab Results  Component Value Date   CREATININE 1.19 09/13/2020   BUN 18 09/13/2020   GFR 48.41 (L) 07/01/2020   GFRNONAA >60 09/13/2020   GFRAA >60 03/10/2020  NA 138 09/13/2020   K 4.4 09/13/2020   CALCIUM 8.8 (L) 09/13/2020   CO2 26 09/13/2020   GLUCOSE 118 (H) 09/13/2020    Lab Results  Component Value Date/Time   HGBA1C 6.6 (H) 07/01/2020 07:48 AM   HGBA1C 6.9 (H) 12/26/2019 07:57 AM   GFR 48.41 (L) 07/01/2020 07:48 AM   GFR 55.80 (L) 12/26/2019 07:57 AM   MICROALBUR 1.2 12/26/2019 08:11 AM   MICROALBUR 0.9 12/24/2018 08:05 AM    Last diabetic Eye exam:  Lab Results  Component Value Date/Time   HMDIABEYEEXA No Retinopathy 12/10/2018 12:00 AM   HMDIABEYEEXA Retinopathy (A) 12/10/2018 12:00 AM    Last diabetic Foot exam:  Lab Results  Component Value Date/Time   HMDIABFOOTEX done 07/09/2020 12:00 AM     Lab Results  Component Value Date    CHOL 123 07/01/2020   HDL 37.80 (L) 07/01/2020   LDLCALC 54 11/28/2013   LDLDIRECT 69.0 07/01/2020   TRIG 219.0 (H) 07/01/2020   CHOLHDL 3 07/01/2020    Hepatic Function Latest Ref Rng & Units 09/13/2020 07/01/2020 12/26/2019  Total Protein 6.5 - 8.1 g/dL 7.2 7.0 6.3  Albumin 3.5 - 5.0 g/dL 3.6 4.1 4.0  AST 15 - 41 U/L 20 16 17   ALT 0 - 44 U/L 14 11 13   Alk Phosphatase 38 - 126 U/L 69 55 56  Total Bilirubin 0.3 - 1.2 mg/dL 0.6 0.7 0.6  Bilirubin, Direct 0.0 - 0.3 mg/dL - - -    Lab Results  Component Value Date/Time   TSH 0.85 11/16/2009 02:24 PM   TSH 1.17 07/04/2007 09:10 AM    CBC Latest Ref Rng & Units 09/13/2020 03/10/2020 11/16/2009  WBC 4.0 - 10.5 K/uL 7.0 8.8 8.6  Hemoglobin 13.0 - 17.0 g/dL 12.5(L) 16.1 16.5  Hematocrit 39.0 - 52.0 % 39.6 46.3 47.9  Platelets 150 - 400 K/uL 353 271 308.0    No results found for: VD25OH  Clinical ASCVD: Yes  The ASCVD Risk score Mikey Bussing DC Jr., et al., 2013) failed to calculate for the following reasons:   The valid total cholesterol range is 130 to 320 mg/dL    Depression screen [facility] 2/9 07/09/2020 07/01/2019 06/19/2018  Decreased Interest 0 0 0  Down, Depressed, Hopeless 0 0 0  PHQ - 2 Score 0 0 0  Altered sleeping - - 0  Tired, decreased energy - - 0  Change in appetite - - 0  Feeling bad or failure about yourself  - - 0  Trouble concentrating - - 0  Moving slowly or fidgety/restless - - 0  Suicidal thoughts - - 0  PHQ-9 Score - - 0  Difficult doing work/chores - - Not difficult at all    Social History   Tobacco Use  Smoking Status Former Smoker  . Years: 25.00  . Quit date: 06/12/1978  . Years since quitting: 42.3  Smokeless Tobacco Never Used   BP Readings from Last 3 Encounters:  09/13/20 (P) 140/69  09/13/20 140/69  07/09/20 130/70   Pulse Readings from Last 3 Encounters:  09/13/20 (!) (P) 58  09/13/20 (!) 58  07/09/20 60   Wt Readings from Last 3 Encounters:  09/13/20 (P) 212 lb 4.8 oz (96.3 kg)  09/13/20 212 lb  (96.2 kg)  07/09/20 218 lb 8 oz (99.1 kg)   BMI Readings from Last 3 Encounters:  09/13/20 (P) 30.90 kg/m  09/13/20 30.86 kg/m  07/09/20 31.80 kg/m    Assessment/Interventions: Review of patient past medical history,  allergies, medications, health status, including review of consultants reports, laboratory and other test data, was performed as part of comprehensive evaluation and provision of chronic care management services.   SDOH:  (Social Determinants of Health) assessments and interventions performed: Yes SDOH Interventions   Flowsheet Row Most Recent Value  SDOH Interventions   Financial Strain Interventions Intervention Not Indicated  [Meds affordable]     SDOH Screenings   Alcohol Screen: Not on file  Depression (PHQ2-9): Low Risk   . PHQ-2 Score: 0  Financial Resource Strain: Low Risk   . Difficulty of Paying Living Expenses: Not very hard  Food Insecurity: Not on file  Housing: Not on file  Physical Activity: Not on file  Social Connections: Not on file  Stress: Not on file  Tobacco Use: Medium Risk  . Smoking Tobacco Use: Former Smoker  . Smokeless Tobacco Use: Never Used  Transportation Needs: Not on file    [place]  No Known Allergies  Medications Reviewed Today    Reviewed by Debbora Dus, [facility] (Pharmacist) on 09/23/20 at 1134  Med List Status: <None>  Medication Order Taking? Sig Documenting Provider Last Dose Status Informant  acetaminophen (TYLENOL) 500 MG tablet [id number] Yes Take 500 mg by mouth every 6 (six) hours as needed. [provider] Taking Active   aspirin EC 81 MG tablet [id number] Yes Take 81 mg by mouth daily. [provider] Taking Active Spouse/Significant Other  dorzolamide-timolol (COSOPT) 22.3-6.8 MG/ML ophthalmic solution [id number] Yes Place 1 drop into both eyes 2 (two) times daily.  [provider] Taking Active Spouse/Significant Other           Med Note Min[facility] Breeding Margaret, BRANDY L   Mon Feb 23, 2020 10:32 AM)    fenofibrate 160 MG tablet [id number] Yes TAKE 1 TABLET BY MOUTH EVERY DAY Bedsole, Amy E, MD Taking Active   ferrous sulfate 325 (65 FE) MG tablet [id number] Yes Take 325 mg by mouth daily with breakfast. [provider] Taking Active   hydrochlorothiazide (HYDRODIURIL) 25 MG tablet [id number] Yes TAKE 1 TABLET BY MOUTH EVERY DAY Bedsole, Amy E, MD Taking Active   latanoprost (XALATAN) 0.005 % ophthalmic solution [id number] Yes Place 1 drop into both eyes at bedtime.  [provider] Taking Active Spouse/Significant Other           Med Note (NEWCOMER Algernon Huxley, BRANDY L   Mon Feb 23, 2020 10:32 AM)    metFORMIN (GLUCOPHAGE) 500 MG tablet [id number] Yes TAKE ONE TABLET BY MOUTH TWICE DAILY WITH A MEAL Bedsole, Amy E, MD Taking Active   nitroGLYCERIN (NITROSTAT) 0.4 MG SL tablet [id number] Yes Place 1 tablet (0.4 mg total) under the tongue every 5 (five) minutes as needed for chest pain. End, Christopher, MD Taking Active   Omega-3 Fatty Acids (FISH OIL OMEGA-3 PO) [id number] Yes Take 1 capsule by mouth daily. [provider] Taking Active Spouse/Significant Other  oxyCODONE-acetaminophen (PERCOCET/ROXICET) 5-325 MG tablet [id number] Yes Take 1 tablet by mouth at bedtime. Cammie Sickle, MD Taking Active   rosuvastatin (CRESTOR) 20 MG tablet [id number] Yes TAKE 1 TABLET BY MOUTH EVERY DAY Bedsole, Amy E, MD Taking Active           Patient Active Problem List   Diagnosis Date Noted  . Cancer of parotid gland ([place]) 09/11/2020  . Mass of left side of neck 08/22/2020  . Constipation 07/09/2020  . Abnormal cardiac CT angiography 03/16/2020  . Coronary artery disease involving native coronary artery of  native heart without angina pectoris 03/11/2020  . Mild aortic stenosis 03/11/2020  . Decreased mobility and endurance 12/30/2019  . ICAO (internal carotid artery occlusion), left 07/01/2019  . Seasonal allergic rhinitis due to pollen 09/24/2018  .  Diabetic neuropathy ([place]) 06/21/2017  . Class 1 obesity with serious comorbidity and body mass index (BMI) of 32.0 to 32.9 in adult 06/21/2017  . Diabetic retinopathy associated with type 2 diabetes mellitus ([place]) 03/12/2015  . Counseling regarding advanced care planning and goals of care 12/08/2014  . Erectile dysfunction 06/11/2014  . Diabetes mellitus with ophthalmic complication ([place]) 74/94/4967  . Renal insufficiency 11/25/2010  . Testicular hypofunction 11/17/2009  . High cholesterol 07/05/2007  . CARCINOMA, BASAL CELL, FACE 07/03/2007  . Essential hypertension, benign 07/03/2007    Immunization History  Administered Date(s) Administered  . Influenza, High Dose Seasonal PF 03/26/2017, 02/05/2019, 02/23/2020  . Influenza,inj,Quad PF,6+ Mos 04/22/2015, 02/28/2016, 03/12/2018  . PFIZER(Purple Top)SARS-COV-2 Vaccination 08/08/2019, 09/19/2019, 04/29/2020  . Pneumococcal Conjugate-13 12/08/2014  . Pneumococcal Polysaccharide-23 11/16/2009  . Td 11/16/2009, 12/09/2016  . Tdap 06/10/2015    Conditions to be addressed/monitored:  Hypertension, Hyperlipidemia and Diabetes  Care Plan : [place]  Updates made by Debbora Dus, [place] since 09/23/2020 12:00 AM    Problem: CHL AMB "PATIENT-SPECIFIC PROBLEM"     Long-Range Goal: Patient Stated   Start Date: 09/23/2020  Priority: High  Note:   Current Barriers:  . None identified  Pharmacist Clinical Goal(s):  Marland Kitchen Patient will contact provider office for questions/concerns as evidenced notation of same in electronic health record through collaboration with PharmD and provider.   Interventions: . 1:1 collaboration with Jinny Sanders, MD regarding development and update of comprehensive plan of care as evidenced by provider attestation and co-signature . Inter-disciplinary care team collaboration (see longitudinal plan of care) . Comprehensive medication review performed; medication list updated in electronic medical  record  Hypertension (BP goal <140/90) -Controlled - clinic bp within goal -Current treatment: . HCTZ 25 mg - 1 tablet daily -Medications previously tried: none  -Checks home BP occasionally, purchased new BP machine with arm cuff -Current home readings: purchased new BP machine with arm cuff, usually good per machine, 130s/(unknown) -Current dietary habits: tries to avoid carbs/sugar -Current exercise habits: minimal, just rocking on front porch chair, recent surgery on eye  -Denies hypotensive/hypertensive symptoms  -Denies any falls -Tries to stay hydrated, drinks 20 ounces 2-3 times per day -Educated on Symptoms of hypotension and importance of maintaining adequate hydration; -Counseled to monitor BP at home monthly, document, and provide log at future appointments -Recommended to continue current medication  Hyperlipidemia: (LDL goal < 70) -Controlled LDL 69 -Current treatment: . Rosuvastatin 20 mg - 1 tablet daily . Fenofibrate 160 mg - 1 tablet daily . Fish oil 1200 mg - 1 capsule daily . Aspirin 81 mg - 1 tablet daily (evening) . Nitroglycerin 0.4 mg SL - as needed  -Medications previously tried: none reported -Educated on Cholesterol goals;  -Recommended to continue current medication  Diabetes (A1c goal <7%) -Controlled A1c 6.6 -Current medications: Marland Kitchen Metformin 500 mg - 1 tablet twice daily with meals  -Medications previously tried: none  -Current home glucose readings - checks every other day before breakfast . fasting glucose: 140, 112, 116, 160 avg - high 120s-130 (depending on meals) . post prandial glucose: none -Denies hypoglycemic/hyperglycemic symptoms -Current meal patterns: good appetite, difficulty eating since facial surgery, following soft diet right now  . breakfast: 9-10 AM . lunch:  2-4 PM, meat and cheese sandwich . dinner: light dinner . snacks: none . drinks: water  -Educated on A1c and blood sugar goals; -Counseled to check feet daily and get  yearly eye exams - up to date -Recommended to continue current medication   Pain medication: Tylenol PM - no longer taking, stopped once he started oxycodone Tylenol 500 mg - uses PRN pain prior to trying the oxycodone/acetaminophen  Aleve 220 mg - no longer taking, patient was told to D/C Percocet - 1 tablet at bedtime, using since facial surgery 03/21 -- Denies any sharp pain even after surgery, pain scale has been < 5  -- Has been able to maintain pain control with Tylenol primarily Pt reports difficulty sleeping due to facial surgery, difficult to find comfortable position. He can only lie on his right side of face.  Reports this is improving.  Patient Goals/Self-Care Activities . Patient will:  - take medications as prescribed  Follow Up Plan: Telephone follow up appointment with care management team member scheduled for: 6-9 months  Mail Care Plan     Medication Assistance: None required.  Patient affirms current coverage meets needs.  Star Rating Drugs: Medication:                Last Fill:         Day Supply Metformin 500 mg       07/06/2020        90DS Rosuvastatin 20 mg    09/03/2020        90DS  Patient's preferred pharmacy is: [facility], [place] [address] [place] [address] Phone: [phone] Fax: [phone]  [id number] [place] [place] [id number]: [phone]: [phone] Uses pill box? Yes - weekly pillbox, has one box for daytime and one for after dinner  Pt endorses good compliance  [place] [facility] [place] He enjoys getting out of the house to pick up medications.  We discussed: Benefits of medication synchronization, packaging and delivery as well as enhanced pharmacist oversight with [employer]. Patient decided to: Continue current medication  management strategy  Care Plan and Follow Up Patient Decision:  Patient agrees to Care Plan and Follow-up.  MDebbora Dus PharmD Clinical Pharmacist [facility] at [phone]

## 2020-09-24 ENCOUNTER — Other Ambulatory Visit: Payer: Self-pay | Admitting: *Deleted

## 2020-09-24 ENCOUNTER — Ambulatory Visit
Admission: RE | Admit: 2020-09-24 | Discharge: 2020-09-24 | Disposition: A | Discharge status: Home / Self Care | Payer: PPO | Source: Ambulatory Visit | Attending: Radiation Oncology | Admitting: Radiation Oncology

## 2020-09-24 DIAGNOSIS — C07 Malignant neoplasm of parotid gland: Secondary | ICD-10-CM | POA: Diagnosis not present

## 2020-09-28 DIAGNOSIS — C07 Malignant neoplasm of parotid gland: Secondary | ICD-10-CM | POA: Diagnosis not present

## 2020-09-29 ENCOUNTER — Inpatient Hospital Stay: Payer: PPO

## 2020-09-30 ENCOUNTER — Ambulatory Visit: Admission: RE | Admit: 2020-09-30 | Payer: PPO | Source: Ambulatory Visit

## 2020-09-30 ENCOUNTER — Other Ambulatory Visit: Payer: Self-pay | Admitting: Family Medicine

## 2020-09-30 ENCOUNTER — Inpatient Hospital Stay: Payer: PPO | Admitting: Internal Medicine

## 2020-10-04 ENCOUNTER — Other Ambulatory Visit: Payer: Self-pay

## 2020-10-04 ENCOUNTER — Ambulatory Visit: Payer: PPO

## 2020-10-04 ENCOUNTER — Inpatient Hospital Stay: Payer: PPO | Admitting: Internal Medicine

## 2020-10-04 ENCOUNTER — Encounter: Payer: Self-pay | Admitting: Internal Medicine

## 2020-10-04 ENCOUNTER — Ambulatory Visit
Admission: RE | Admit: 2020-10-04 | Discharge: 2020-10-04 | Disposition: A | Discharge status: Home / Self Care | Payer: PPO | Source: Ambulatory Visit | Attending: Radiation Oncology | Admitting: Radiation Oncology

## 2020-10-04 DIAGNOSIS — C07 Malignant neoplasm of parotid gland: Secondary | ICD-10-CM | POA: Diagnosis not present

## 2020-10-04 NOTE — Assessment & Plan Note (Addendum)
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 Stage: pT4aN3b-stage IVb.  HER-2 positive; androgen receptor positive.]  PET scan-negative for distant metastatic disease.  Recommend NGS-next visit.  Also discussed with him conference.  #Had a long discussion the patient regarding his stage and pathology, being aggressive tumor.  Given the locally advanced disease patient had a very significant high risk of recurrence without any therapies.  Recommend adjuvant radiation; s/p evaluation.   #I had a long discussion with patient and daughter regarding concurrent chemotherapy-however given patient's age, I am concerned about significant toxicity from the addition of chemotherapy like cisplatin.  Recommend holding off concurrent chemotherapy at this time.  Patient/daughter in favor of holding chemotherapy.  They are very concerned about quality of life issues while on concurrent therapy.  #Patient/daughter understands that given his multiple risk high risk features [include-high-grade malignancy; T4 lesion; multiple lymph nodes; involvement of the nerve]-patient is at high risk of recurrence even post radiation.   #Recommend close monitoring post radiation-for early detection of metastatic disease-   # CKD- stage III-  [1 BID; tylenol]; recommend avoiding Aleve; recommend Tylenol/oxycodone as needed.  # DISPOSITION:  # follow up in 5 weeks- MD; labs- cbc/cmp; possible IVfs over 1 hour-Dr.B  # I reviewed the blood work- with the patient in detail; also reviewed the imaging independently [as summarized above]; and with the patient in detail.    # 40 minutes face-to-face with the patient discussing the above plan of care; more than 50% of time spent on  prognosis/ natural history; counseling and coordination.

## 2020-10-05 ENCOUNTER — Ambulatory Visit: Payer: PPO

## 2020-10-05 ENCOUNTER — Ambulatory Visit
Admission: RE | Admit: 2020-10-05 | Discharge: 2020-10-05 | Disposition: A | Discharge status: Home / Self Care | Payer: PPO | Source: Ambulatory Visit | Attending: Radiation Oncology | Admitting: Radiation Oncology

## 2020-10-05 DIAGNOSIS — C07 Malignant neoplasm of parotid gland: Secondary | ICD-10-CM | POA: Diagnosis not present

## 2020-10-06 ENCOUNTER — Ambulatory Visit
Admission: RE | Admit: 2020-10-06 | Discharge: 2020-10-06 | Disposition: A | Discharge status: Home / Self Care | Payer: PPO | Source: Ambulatory Visit | Attending: Radiation Oncology | Admitting: Radiation Oncology

## 2020-10-06 ENCOUNTER — Ambulatory Visit: Payer: PPO

## 2020-10-06 DIAGNOSIS — C07 Malignant neoplasm of parotid gland: Secondary | ICD-10-CM | POA: Diagnosis not present

## 2020-10-07 ENCOUNTER — Ambulatory Visit: Payer: PPO

## 2020-10-07 ENCOUNTER — Ambulatory Visit
Admission: RE | Admit: 2020-10-07 | Discharge: 2020-10-07 | Disposition: A | Discharge status: Home / Self Care | Payer: PPO | Source: Ambulatory Visit | Attending: Radiation Oncology | Admitting: Radiation Oncology

## 2020-10-07 DIAGNOSIS — C07 Malignant neoplasm of parotid gland: Secondary | ICD-10-CM | POA: Diagnosis not present

## 2020-10-08 ENCOUNTER — Ambulatory Visit
Admission: RE | Admit: 2020-10-08 | Discharge: 2020-10-08 | Disposition: A | Discharge status: Home / Self Care | Payer: PPO | Source: Ambulatory Visit | Attending: Radiation Oncology | Admitting: Radiation Oncology

## 2020-10-08 ENCOUNTER — Ambulatory Visit: Payer: PPO

## 2020-10-08 DIAGNOSIS — C07 Malignant neoplasm of parotid gland: Secondary | ICD-10-CM | POA: Diagnosis not present

## 2020-10-08 NOTE — Progress Notes (Signed)
[facility] NOTE  Patient Care Team: Jinny Sanders, MD as PCP - General End, Harrell Gave, MD as PCP - Cardiology (Cardiology) Hayden Pedro, MD as Consulting Physician (Ophthalmology) Lorelee Cover., MD as Referring Physician (Ophthalmology) Oneta Rack, MD as Referring Physician (Dermatology) Ladene Artist, MD as Consulting Physician (Gastroenterology) Rosalene Billings., MD as Referring Physician (Dentistry) Debbora Dus, [facility] as Pharmacist (Pharmacist)  CHIEF COMPLAINTS/PURPOSE OF CONSULTATION: parotid cancer  #  Oncology History Overview Note  Addendum    Upon clinician request, additional immunostains are performed at [facility] on block F2, with the following results in the tumor cells:   HER2 ([place], clone [id number]) Interpretation: Positive IHC Score: 3+ (using the ASCO/CAP breast biomarker scoring guidelines; there are no consensus guideline for scoring HER2 in salivary duct carcinoma)   This slide has also been reviewed by Dr. Shelby Mattocks [facility] breast pathology) who concurs.  Addendum electronically signed by Tomasa Blase, MD on 09/03/2020 at  3:21 PM  Diagnosis    A. "Tissue around stylomastoid foramen"  - Fibrovascular tissue extensively involved by carcinoma   B. "Main trunk facial nerve"  - Nerve and fibrous tissue extensively involved by carcinoma     C. "Distal facial nerve" - Nerve tissue; negative for carcinoma     D. "Proximal mastoid nerve margin" - Nerve tissue; negative for carcinoma   E: Left upper superficial parotid, parotidectomy - Benign parotid parenchyma with focal acute and chronic inflammation - Negative for carcinoma   F: Left inferior parotid with sternocleidomastoid muscle, resection - High-grade salivary gland carcinoma, most consistent with salivary duct carcinoma (see comment and synoptic report for further details) - Cauterized tumor present at inked deep surgical margin within fibrous tissue  adjacent to skeletal muscle - Metastatic carcinoma in 1.1 cm nodal conglomerate and 4 of 4 intact lymph nodes (at least 5/5); positive for extranodal extension    G: Lymph node, left neck level 5, lymphadenectomy - Benign adipose; no lymph nodes or carcinoma identified   H: Lymph node, left neck level 1, lymphadenectomy - Metastatic carcinoma in 1 of 5 lymph nodes (1/5); maximum size of metastasis 2 mm; negative for extranodal extension.   I: Left omohyoid, excision - Benign skeletal muscle; negative for carcinoma   J: Left deep lobe parotid, excision - Benign parotid tissue; negative for carcinoma   K: Parotid tissue, biopsy - Benign parotid tissue - No metastatic carcinoma identified in 1 lymph nodes (0/1)   L: Left tissue over mastoid, biopsy - Benign parotid parenchyma, dense fibrous tissue, and bone; negative for carcinoma   M: "Stylomastoid foramen stitch on proximal nerve", excision - Large nerve, negative for perineural/intraneural carcinoma; stitched margin negative - Attached fibrous tissue and parotid parenchyma positive for carcinoma, focally invading around but not within large nerve segment, with crushed/cauterized tumor present at tissue edges   N: Tissue over styloid process, biopsy - Benign parotid tissue; negative for carcinoma   O: Posterior digastric margin, biopsy - Benign skeletal muscle; negative for carcinoma   P: Left neck dissections level 1 through 5, lymphadenectomy - Metastatic carcinoma in 11 of 26 lymph nodes (11/26); maximum size of metastasis 20 mm; two nodes (16 mm and 14 mm) positive for extranodal extension (ENEma, up to 4 mm beyond capsule)   Diagnosis: 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 -----------------------------------------------------------------   07/13/10: Right parathyroidectomy Pathology: Hypercellular parathyroid gland   -------------------------------------------------------------------------------------   # MARCH 2022- CT chest- [[facility]] Subcentimeter pulmonary nodules measuring up to 0.8 cm, indeterminate. No comparison available. Attention on follow-up is recommended.  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 Stage: pT4aN3b-stage IVb.  HER-2 positive; androgen receptor positive.]  PET scan-negative for distant metastatic disease.   # CAD [card]; DM- 2; CKD-III     Cancer of parotid gland ([place])  09/11/2020 Initial Diagnosis   Cancer of parotid gland ([place])   09/16/2020 Cancer Staging   Staging form: Major Salivary Glands, AJCC 8th Edition - Pathologic: Stage IVB (pT4a, pN3b, cM0) - Signed by Cammie Sickle, MD on 09/16/2020      HISTORY OF PRESENTING ILLNESS:  Miguel Bradley 80 y.o.  male pleasant patient with locally advanced high-grade salivary gland/parotid cancer is here to review the results of the PET scan/plan of care.  Patient interim has met with radiation oncology.  Plan to start radiation soon.  Patient denies any worsening pain.  Taking Tylenol as needed.  He otherwise feels good.  Review of Systems  Constitutional: Negative for chills, diaphoresis, fever, malaise/fatigue and weight loss.  HENT: Negative for nosebleeds and sore throat.   Eyes: Negative for double vision.  Respiratory: Negative for cough, hemoptysis, sputum production, shortness of breath and wheezing.   Cardiovascular: Negative for chest pain,  palpitations, orthopnea and leg swelling.  Gastrointestinal: Negative for abdominal pain, blood in stool, constipation, diarrhea, heartburn, melena, nausea and vomiting.  Genitourinary: Negative for dysuria, frequency and urgency.  Musculoskeletal: Positive for joint pain. Negative for back pain.  Skin: Negative.  Negative for itching and rash.  Neurological: Negative for dizziness, tingling, focal weakness, weakness and headaches.  Endo/Heme/Allergies: Does not bruise/bleed easily.  Psychiatric/Behavioral: Negative for depression. The patient is not nervous/anxious and does not have insomnia.      MEDICAL HISTORY:  Past Medical History:  Diagnosis Date  . Anemia   . Basal cell carcinoma of face   . Cataract   . Diabetes mellitus without complication ([place])   . Glaucoma   . Hypercalcemia   . Hyperparathyroidism, unspecified ([place])   . Impotence of organic origin   . Other testicular hypofunction   . Pure hyperglyceridemia   . Special screening for malignant neoplasm of prostate   . Special screening for malignant neoplasms, colon   . Unspecified essential hypertension     SURGICAL HISTORY: Past Surgical History:  Procedure Laterality Date  . COLONOSCOPY  06/13/2018   Dr. Lucio Edward  . LEFT HEART CATH AND CORONARY ANGIOGRAPHY Left 03/16/2020   Procedure: LEFT HEART CATH AND CORONARY ANGIOGRAPHY;  Surgeon: Nelva Bush, MD;  Location: [facility];  Service: Cardiovascular;  Laterality: Left;  . PARATHYROIDECTOMY  2012  . skin cancer removal     Dr. Sharlett Iles  . TRIGGER FINGER RELEASE Left 2008    SOCIAL HISTORY: Social History   Socioeconomic History  . Marital status: Married    Spouse name: Not on file  . Number of children: 1  . Years of education: Not on file  . Highest education level: Not on file  Occupational History  . Occupation: Geologist, engineering  agent at [employer]  . Smoking status: Former Smoker    Years: 25.00    Quit date: 06/12/1978     Years since quitting: 42.3  . Smokeless tobacco: Never Used  Vaping Use  . Vaping Use: Never used  Substance and Sexual Activity  . Alcohol use: Yes    Comment: 1 beer every few weeks  . Drug use: No  . Sexual activity: Not Currently  Other Topics Concern  . Not on file  Social History Narrative   No exercise, plays golf. Lives in [place]; self- ; daughter. puchasing dept in [place]. Rare alcohol. Quit smoking in 1980.    Social Determinants of Health   Financial Resource Strain: Low Risk   . Difficulty of Paying Living Expenses: Not very hard  Food Insecurity: Not on file  Transportation Needs: Not on file  Physical Activity: Not on file  Stress: Not on file  Social Connections: Not on file  Intimate Partner Violence: Not on file    FAMILY HISTORY: Family History  Problem Relation Age of Onset  . Hypertension Mother   . Dementia Mother   . Diabetes Other        aunt  . Hypertension Brother   . Hypertension Brother   . Hypertension Brother   . Hyperlipidemia Brother   . Hyperlipidemia Brother   . Hyperlipidemia Brother   . Hypertension Sister   . Bladder Cancer Sister   . Hyperlipidemia Sister   . Colon cancer Neg Hx   . Esophageal cancer Neg Hx   . Rectal cancer Neg Hx   . Stomach cancer Neg Hx     ALLERGIES:  has No Known Allergies.  MEDICATIONS:  Current Outpatient Medications  Medication Sig Dispense Refill  . acetaminophen (TYLENOL) 500 MG tablet Take 500 mg by mouth every 6 (six) hours as needed.    Marland Kitchen aspirin EC 81 MG tablet Take 81 mg by mouth daily.    . dorzolamide-timolol (COSOPT) 22.3-6.8 MG/ML ophthalmic solution Place 1 drop into both eyes 2 (two) times daily.     . fenofibrate 160 MG tablet TAKE 1 TABLET BY MOUTH EVERY DAY 90 tablet 3  . ferrous sulfate 325 (65 FE) MG tablet Take 325 mg by mouth daily with breakfast.    . hydrochlorothiazide (HYDRODIURIL) 25 MG tablet TAKE 1 TABLET BY MOUTH EVERY DAY 90 tablet 3  . latanoprost (XALATAN)  0.005 % ophthalmic solution Place 1 drop into both eyes at bedtime.     . metFORMIN (GLUCOPHAGE) 500 MG tablet TAKE ONE TABLET BY MOUTH TWICE DAILY WITH A MEAL 180 tablet 0  . rosuvastatin (CRESTOR) 20 MG tablet TAKE 1 TABLET BY MOUTH EVERY DAY 90 tablet 3  . nitroGLYCERIN (NITROSTAT) 0.4 MG SL tablet Place 1 tablet (0.4 mg total) under the tongue every 5 (five) minutes as needed for chest pain. (Patient not taking: Reported on 10/04/2020) 25 tablet prn   No current facility-administered medications for this visit.      Marland Kitchen  PHYSICAL EXAMINATION: ECOG PERFORMANCE STATUS: 0 - Asymptomatic  Vitals:   10/04/20 1015  BP: 138/75  Pulse: (!) 52  Resp: 18  Temp: (!) 97.3 F (36.3 C)   Filed Weights   10/04/20 1015  Weight: 212 lb (96.2 kg)    Physical Exam Constitutional:      Comments: Ambulating dependently.  Accompanied by his daughter.  HENT:     Head: Normocephalic and atraumatic.     Mouth/Throat:  Pharynx: No oropharyngeal exudate.  Eyes:     Pupils: Pupils are equal, round, and reactive to light.  Neck:     Comments: Incision from the left neck dissection; parotidectomy noted.  Well-healing.  No evidence of any infection. Cardiovascular:     Rate and Rhythm: Normal rate and regular rhythm.  Pulmonary:     Effort: No respiratory distress.     Breath sounds: No wheezing.  Abdominal:     General: Bowel sounds are normal. There is no distension.     Palpations: Abdomen is soft. There is no mass.     Tenderness: There is no abdominal tenderness. There is no guarding or rebound.  Musculoskeletal:        General: No tenderness. Normal range of motion.     Cervical back: Normal range of motion and neck supple.  Skin:    General: Skin is warm.  Neurological:     Mental Status: He is alert and oriented to person, place, and time.     Comments: Left facial droop noted-post surgery.  Psychiatric:        Mood and Affect: Affect normal.      LABORATORY DATA:  I have  reviewed the data as listed Lab Results  Component Value Date   WBC 7.0 09/13/2020   HGB 12.5 (L) 09/13/2020   HCT 39.6 09/13/2020   MCV 95.7 09/13/2020   PLT 353 09/13/2020   Recent Labs    12/26/19 0757 02/23/20 1201 03/10/20 1636 07/01/20 0748 09/13/20 1208  NA 135 138 139 136 138  K 3.6 4.3 3.8 4.4 4.4  CL 102 101 104 101 102  CO2 $Re'24 24 24 29 26  'KuA$ GLUCOSE 160* 122* 108* 131* 118*  BUN $Re'20 17 17 17 18  'iYQ$ CREATININE 1.25 1.13 1.31* 1.39 1.19  CALCIUM 9.0 9.8 9.6 9.3 8.8*  GFRNONAA  --  62 52*  --  >60  GFRAA  --  72 >60  --   --   PROT 6.3  --   --  7.0 7.2  ALBUMIN 4.0  --   --  4.1 3.6  AST 17  --   --  16 20  ALT 13  --   --  11 14  ALKPHOS 56  --   --  55 69  BILITOT 0.6  --   --  0.7 0.6    RADIOGRAPHIC STUDIES: I have personally reviewed the radiological images as listed and agreed with the findings in the report. NM PET Image Initial (PI) Skull Base To Thigh  Result Date: 09/23/2020 CLINICAL DATA:  Initial treatment strategy for left parotid gland tumor. EXAM: NUCLEAR MEDICINE PET SKULL BASE TO THIGH TECHNIQUE: 11.6 mCi F-18 FDG was injected intravenously. Full-ring PET imaging was performed from the skull base to thigh after the radiotracer. CT data was obtained and used for attenuation correction and anatomic localization. Fasting blood glucose: 125 mg/dl COMPARISON:  07/22/2020 FINDINGS: Mediastinal blood pool activity: SUV max 3.4 Liver activity: SUV max NA NECK: Postoperative findings in the left neck with resection of the left parotid gland and part of the left mastoid, and fatty tissues in the region likely from graft. The postoperative findings extend back along the sternocleidomastoid muscle. There is mildly accentuated signal along these postoperative findings, with maximum SUV along the sternocleidomastoid of about 4.5, and maximum SUV along the resection bed of about 4.0. This is likely related to healing response in the vicinity and I not see a focal hot spot  to indicate obvious residual focus of tumor although today's exam will be a useful baseline for future comparisons. No focal hypermetabolic lymph node is identified. Incidental CT findings: Metal density along the left anterior eyelid region. Bilateral common carotid atherosclerotic calcification. Failure fusion of the posterior arch of C1. Chronic left maxillary sinusitis. CHEST: No significant abnormal hypermetabolic activity in this region. Incidental CT findings: Coronary, aortic arch, and branch vessel atherosclerotic vascular disease. Mild cardiomegaly. Mild atelectasis along both hemidiaphragms and in the posterior basal segment right lower lobe. ABDOMEN/PELVIS: Physiologic activity in bowel. Low-grade activity along a fluid collection along the superficial fascia margin in the left anterior thigh region for example on image 290 of series 3, likely due to low-grade inflammation, query recent operative intervention or Morel-Lavalle injury. Incidental CT findings: Diffuse hepatic steatosis. Aortoiliac atherosclerotic vascular disease. Exophytic renal lesions of varying complexity, smaller lesions are indeterminate (although statistically likely to be complex cysts) the larger lesions appear photopenic and accordingly favor benign cysts. SKELETON: No significant abnormal hypermetabolic activity in this region. Incidental CT findings: none IMPRESSION: 1. Postoperative findings in the left neck without worrisome focal activity to to suggest residual/recurrent malignancy. No findings of active malignancy in the chest, abdomen, or pelvis. 2. Fluid collection tracking along the superficial fascia margin of the left anterior thigh, possibly a postoperative fluid collection or from a [employer] injury, with associated low-level inflammatory activity. 3. Other imaging findings of potential clinical significance: Aortic Atherosclerosis (ICD10-I70.0). Chronic left maxillary sinusitis. Coronary atherosclerosis. Mild  cardiomegaly. Minimal basilar atelectasis in the lungs. Diffuse hepatic steatosis. Renal lesions of varying complexity, some of the smaller lesions are indeterminate (if the patient has hematuria or if otherwise clinically indicated, these could be worked up with renal protocol MRI with and without contrast). Electronically Signed   By: Van Clines M.D.   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 Stage: pT4aN3b-stage IVb.  HER-2 positive; androgen receptor positive.]  PET scan-negative for distant metastatic disease.  Recommend NGS-next visit.  Also discussed with him conference.  #Had a long discussion the patient regarding his stage and pathology, being aggressive tumor.  Given the locally advanced disease patient had a very significant high risk of recurrence without any therapies.  Recommend adjuvant radiation; s/p evaluation.   #I had a long discussion with patient and daughter regarding concurrent chemotherapy-however given patient's age, I am concerned about significant toxicity from the addition of chemotherapy like cisplatin.  Recommend holding off concurrent chemotherapy at this time.  Patient/daughter in favor of holding chemotherapy.  They are very concerned about quality of life issues while on concurrent therapy.  #Patient/daughter understands that given his multiple risk high risk features [include-high-grade malignancy; T4 lesion; multiple lymph nodes; involvement of the nerve]-patient is at high risk of recurrence even post radiation.   #Recommend close monitoring post radiation-for early detection of metastatic disease-   # CKD- stage III-   [1 BID; tylenol]; recommend avoiding Aleve; recommend Tylenol/oxycodone as needed.  # DISPOSITION:  # follow up in 5 weeks- MD; labs- cbc/cmp; possible IVfs over 1 hour-Dr.B  # I reviewed the blood work- with the patient in detail; also reviewed the imaging independently [as summarized above]; and with  the patient in detail.    # 40 minutes face-to-face with the patient discussing the above plan of care; more than 50% of time spent on prognosis/ natural history; counseling and coordination.    All questions were answered. The patient knows to call the clinic with any problems, questions or concerns.    Cammie Sickle, MD 10/08/2020 7:45 AM

## 2020-10-11 ENCOUNTER — Ambulatory Visit: Payer: PPO

## 2020-10-11 ENCOUNTER — Ambulatory Visit
Admission: RE | Admit: 2020-10-11 | Discharge: 2020-10-11 | Disposition: A | Discharge status: Home / Self Care | Payer: PPO | Source: Ambulatory Visit | Attending: Radiation Oncology | Admitting: Radiation Oncology

## 2020-10-11 DIAGNOSIS — C07 Malignant neoplasm of parotid gland: Secondary | ICD-10-CM | POA: Insufficient documentation

## 2020-10-11 DIAGNOSIS — Z510 Encounter for antineoplastic radiation therapy: Secondary | ICD-10-CM | POA: Diagnosis not present

## 2020-10-12 ENCOUNTER — Ambulatory Visit: Payer: PPO

## 2020-10-12 ENCOUNTER — Ambulatory Visit
Admission: RE | Admit: 2020-10-12 | Discharge: 2020-10-12 | Disposition: A | Discharge status: Home / Self Care | Payer: PPO | Source: Ambulatory Visit | Attending: Radiation Oncology | Admitting: Radiation Oncology

## 2020-10-12 DIAGNOSIS — Z510 Encounter for antineoplastic radiation therapy: Secondary | ICD-10-CM | POA: Diagnosis not present

## 2020-10-12 DIAGNOSIS — C07 Malignant neoplasm of parotid gland: Secondary | ICD-10-CM | POA: Diagnosis not present

## 2020-10-13 ENCOUNTER — Ambulatory Visit: Payer: PPO

## 2020-10-13 ENCOUNTER — Ambulatory Visit
Admission: RE | Admit: 2020-10-13 | Discharge: 2020-10-13 | Disposition: A | Discharge status: Home / Self Care | Payer: PPO | Source: Ambulatory Visit | Attending: Radiation Oncology | Admitting: Radiation Oncology

## 2020-10-13 DIAGNOSIS — C07 Malignant neoplasm of parotid gland: Secondary | ICD-10-CM | POA: Diagnosis not present

## 2020-10-13 DIAGNOSIS — Z510 Encounter for antineoplastic radiation therapy: Secondary | ICD-10-CM | POA: Diagnosis not present

## 2020-10-14 ENCOUNTER — Ambulatory Visit: Payer: PPO

## 2020-10-14 ENCOUNTER — Ambulatory Visit
Admission: RE | Admit: 2020-10-14 | Discharge: 2020-10-14 | Disposition: A | Discharge status: Home / Self Care | Payer: PPO | Source: Ambulatory Visit | Attending: Radiation Oncology | Admitting: Radiation Oncology

## 2020-10-14 DIAGNOSIS — C07 Malignant neoplasm of parotid gland: Secondary | ICD-10-CM | POA: Diagnosis not present

## 2020-10-14 DIAGNOSIS — H40153 Residual stage of open-angle glaucoma, bilateral: Secondary | ICD-10-CM | POA: Diagnosis not present

## 2020-10-14 DIAGNOSIS — Z510 Encounter for antineoplastic radiation therapy: Secondary | ICD-10-CM | POA: Diagnosis not present

## 2020-10-15 ENCOUNTER — Inpatient Hospital Stay: Payer: PPO | Attending: Radiation Oncology

## 2020-10-15 ENCOUNTER — Ambulatory Visit
Admission: RE | Admit: 2020-10-15 | Discharge: 2020-10-15 | Disposition: A | Discharge status: Home / Self Care | Payer: PPO | Source: Ambulatory Visit | Attending: Radiation Oncology | Admitting: Radiation Oncology

## 2020-10-15 ENCOUNTER — Ambulatory Visit: Payer: PPO

## 2020-10-15 DIAGNOSIS — Z510 Encounter for antineoplastic radiation therapy: Secondary | ICD-10-CM | POA: Diagnosis not present

## 2020-10-15 DIAGNOSIS — C07 Malignant neoplasm of parotid gland: Secondary | ICD-10-CM

## 2020-10-15 LAB — CBC
HCT: 45.7 % (ref 39.0–52.0)
Hemoglobin: 14.6 g/dL (ref 13.0–17.0)
MCH: 30.3 pg (ref 26.0–34.0)
MCHC: 31.9 g/dL (ref 30.0–36.0)
MCV: 94.8 fL (ref 80.0–100.0)
Platelets: 271 10*3/uL (ref 150–400)
RBC: 4.82 MIL/uL (ref 4.22–5.81)
RDW: 13.7 % (ref 11.5–15.5)
WBC: 7.8 10*3/uL (ref 4.0–10.5)
nRBC: 0 % (ref 0.0–0.2)

## 2020-10-18 ENCOUNTER — Ambulatory Visit
Admission: RE | Admit: 2020-10-18 | Discharge: 2020-10-18 | Disposition: A | Discharge status: Home / Self Care | Payer: PPO | Source: Ambulatory Visit | Attending: Radiation Oncology | Admitting: Radiation Oncology

## 2020-10-18 ENCOUNTER — Ambulatory Visit: Payer: PPO

## 2020-10-18 DIAGNOSIS — C07 Malignant neoplasm of parotid gland: Secondary | ICD-10-CM | POA: Diagnosis not present

## 2020-10-18 DIAGNOSIS — Z510 Encounter for antineoplastic radiation therapy: Secondary | ICD-10-CM | POA: Diagnosis not present

## 2020-10-19 ENCOUNTER — Ambulatory Visit
Admission: RE | Admit: 2020-10-19 | Discharge: 2020-10-19 | Disposition: A | Discharge status: Home / Self Care | Payer: PPO | Source: Ambulatory Visit | Attending: Radiation Oncology | Admitting: Radiation Oncology

## 2020-10-19 ENCOUNTER — Ambulatory Visit: Payer: PPO

## 2020-10-19 DIAGNOSIS — Z510 Encounter for antineoplastic radiation therapy: Secondary | ICD-10-CM | POA: Diagnosis not present

## 2020-10-19 DIAGNOSIS — C07 Malignant neoplasm of parotid gland: Secondary | ICD-10-CM | POA: Diagnosis not present

## 2020-10-20 ENCOUNTER — Ambulatory Visit: Payer: PPO

## 2020-10-20 ENCOUNTER — Ambulatory Visit
Admission: RE | Admit: 2020-10-20 | Discharge: 2020-10-20 | Disposition: A | Discharge status: Home / Self Care | Payer: PPO | Source: Ambulatory Visit | Attending: Radiation Oncology | Admitting: Radiation Oncology

## 2020-10-20 DIAGNOSIS — Z510 Encounter for antineoplastic radiation therapy: Secondary | ICD-10-CM | POA: Diagnosis not present

## 2020-10-20 DIAGNOSIS — C07 Malignant neoplasm of parotid gland: Secondary | ICD-10-CM | POA: Diagnosis not present

## 2020-10-21 ENCOUNTER — Ambulatory Visit
Admission: RE | Admit: 2020-10-21 | Discharge: 2020-10-21 | Disposition: A | Discharge status: Home / Self Care | Payer: PPO | Source: Ambulatory Visit | Attending: Radiation Oncology | Admitting: Radiation Oncology

## 2020-10-21 ENCOUNTER — Ambulatory Visit: Payer: PPO

## 2020-10-21 DIAGNOSIS — C07 Malignant neoplasm of parotid gland: Secondary | ICD-10-CM | POA: Diagnosis not present

## 2020-10-21 DIAGNOSIS — Z510 Encounter for antineoplastic radiation therapy: Secondary | ICD-10-CM | POA: Diagnosis not present

## 2020-10-22 ENCOUNTER — Other Ambulatory Visit: Payer: Self-pay

## 2020-10-22 ENCOUNTER — Ambulatory Visit: Payer: PPO

## 2020-10-22 ENCOUNTER — Ambulatory Visit
Admission: RE | Admit: 2020-10-22 | Discharge: 2020-10-22 | Disposition: A | Discharge status: Home / Self Care | Payer: PPO | Source: Ambulatory Visit | Attending: Radiation Oncology | Admitting: Radiation Oncology

## 2020-10-22 ENCOUNTER — Inpatient Hospital Stay: Payer: PPO

## 2020-10-22 DIAGNOSIS — Z510 Encounter for antineoplastic radiation therapy: Secondary | ICD-10-CM | POA: Diagnosis not present

## 2020-10-22 DIAGNOSIS — C07 Malignant neoplasm of parotid gland: Secondary | ICD-10-CM

## 2020-10-22 LAB — CBC
HCT: 44.7 % (ref 39.0–52.0)
Hemoglobin: 14.9 g/dL (ref 13.0–17.0)
MCH: 30.8 pg (ref 26.0–34.0)
MCHC: 33.3 g/dL (ref 30.0–36.0)
MCV: 92.5 fL (ref 80.0–100.0)
Platelets: 267 10*3/uL (ref 150–400)
RBC: 4.83 MIL/uL (ref 4.22–5.81)
RDW: 13.2 % (ref 11.5–15.5)
WBC: 8.2 10*3/uL (ref 4.0–10.5)
nRBC: 0 % (ref 0.0–0.2)

## 2020-10-25 ENCOUNTER — Ambulatory Visit: Payer: PPO

## 2020-10-25 ENCOUNTER — Ambulatory Visit
Admission: RE | Admit: 2020-10-25 | Discharge: 2020-10-25 | Disposition: A | Discharge status: Home / Self Care | Payer: PPO | Source: Ambulatory Visit | Attending: Radiation Oncology | Admitting: Radiation Oncology

## 2020-10-25 DIAGNOSIS — C07 Malignant neoplasm of parotid gland: Secondary | ICD-10-CM | POA: Diagnosis not present

## 2020-10-25 DIAGNOSIS — Z510 Encounter for antineoplastic radiation therapy: Secondary | ICD-10-CM | POA: Diagnosis not present

## 2020-10-26 ENCOUNTER — Ambulatory Visit: Payer: PPO

## 2020-10-26 ENCOUNTER — Ambulatory Visit
Admission: RE | Admit: 2020-10-26 | Discharge: 2020-10-26 | Disposition: A | Discharge status: Home / Self Care | Payer: PPO | Source: Ambulatory Visit | Attending: Radiation Oncology | Admitting: Radiation Oncology

## 2020-10-26 DIAGNOSIS — Z510 Encounter for antineoplastic radiation therapy: Secondary | ICD-10-CM | POA: Diagnosis not present

## 2020-10-26 DIAGNOSIS — C07 Malignant neoplasm of parotid gland: Secondary | ICD-10-CM | POA: Diagnosis not present

## 2020-10-27 ENCOUNTER — Ambulatory Visit: Payer: PPO

## 2020-10-27 ENCOUNTER — Ambulatory Visit
Admission: RE | Admit: 2020-10-27 | Discharge: 2020-10-27 | Disposition: A | Discharge status: Home / Self Care | Payer: PPO | Source: Ambulatory Visit | Attending: Radiation Oncology | Admitting: Radiation Oncology

## 2020-10-27 DIAGNOSIS — Z510 Encounter for antineoplastic radiation therapy: Secondary | ICD-10-CM | POA: Diagnosis not present

## 2020-10-27 DIAGNOSIS — C07 Malignant neoplasm of parotid gland: Secondary | ICD-10-CM | POA: Diagnosis not present

## 2020-10-28 ENCOUNTER — Ambulatory Visit: Payer: PPO

## 2020-10-28 ENCOUNTER — Ambulatory Visit
Admission: RE | Admit: 2020-10-28 | Discharge: 2020-10-28 | Disposition: A | Discharge status: Home / Self Care | Payer: PPO | Source: Ambulatory Visit | Attending: Radiation Oncology | Admitting: Radiation Oncology

## 2020-10-28 DIAGNOSIS — C07 Malignant neoplasm of parotid gland: Secondary | ICD-10-CM | POA: Diagnosis not present

## 2020-10-28 DIAGNOSIS — Z510 Encounter for antineoplastic radiation therapy: Secondary | ICD-10-CM | POA: Diagnosis not present

## 2020-10-29 ENCOUNTER — Ambulatory Visit
Admission: RE | Admit: 2020-10-29 | Discharge: 2020-10-29 | Disposition: A | Discharge status: Home / Self Care | Payer: PPO | Source: Ambulatory Visit | Attending: Radiation Oncology | Admitting: Radiation Oncology

## 2020-10-29 ENCOUNTER — Ambulatory Visit: Payer: PPO

## 2020-10-29 ENCOUNTER — Other Ambulatory Visit: Payer: Self-pay

## 2020-10-29 ENCOUNTER — Inpatient Hospital Stay: Payer: PPO

## 2020-10-29 DIAGNOSIS — Z510 Encounter for antineoplastic radiation therapy: Secondary | ICD-10-CM | POA: Diagnosis not present

## 2020-10-29 DIAGNOSIS — C07 Malignant neoplasm of parotid gland: Secondary | ICD-10-CM

## 2020-10-29 LAB — CBC
HCT: 46.2 % (ref 39.0–52.0)
Hemoglobin: 15.4 g/dL (ref 13.0–17.0)
MCH: 30.3 pg (ref 26.0–34.0)
MCHC: 33.3 g/dL (ref 30.0–36.0)
MCV: 90.8 fL (ref 80.0–100.0)
Platelets: 262 10*3/uL (ref 150–400)
RBC: 5.09 MIL/uL (ref 4.22–5.81)
RDW: 13.1 % (ref 11.5–15.5)
WBC: 8.2 10*3/uL (ref 4.0–10.5)
nRBC: 0 % (ref 0.0–0.2)

## 2020-11-01 ENCOUNTER — Ambulatory Visit
Admission: RE | Admit: 2020-11-01 | Discharge: 2020-11-01 | Disposition: A | Discharge status: Home / Self Care | Payer: PPO | Source: Ambulatory Visit | Attending: Radiation Oncology | Admitting: Radiation Oncology

## 2020-11-01 ENCOUNTER — Ambulatory Visit: Payer: PPO

## 2020-11-01 DIAGNOSIS — C07 Malignant neoplasm of parotid gland: Secondary | ICD-10-CM | POA: Diagnosis not present

## 2020-11-01 DIAGNOSIS — Z510 Encounter for antineoplastic radiation therapy: Secondary | ICD-10-CM | POA: Diagnosis not present

## 2020-11-02 ENCOUNTER — Ambulatory Visit
Admission: RE | Admit: 2020-11-02 | Discharge: 2020-11-02 | Disposition: A | Discharge status: Home / Self Care | Payer: PPO | Source: Ambulatory Visit | Attending: Radiation Oncology | Admitting: Radiation Oncology

## 2020-11-02 ENCOUNTER — Ambulatory Visit: Payer: PPO

## 2020-11-02 DIAGNOSIS — Z510 Encounter for antineoplastic radiation therapy: Secondary | ICD-10-CM | POA: Diagnosis not present

## 2020-11-02 DIAGNOSIS — C07 Malignant neoplasm of parotid gland: Secondary | ICD-10-CM | POA: Diagnosis not present

## 2020-11-03 ENCOUNTER — Ambulatory Visit
Admission: RE | Admit: 2020-11-03 | Discharge: 2020-11-03 | Disposition: A | Discharge status: Home / Self Care | Payer: PPO | Source: Ambulatory Visit | Attending: Radiation Oncology | Admitting: Radiation Oncology

## 2020-11-03 ENCOUNTER — Ambulatory Visit: Payer: PPO

## 2020-11-03 DIAGNOSIS — Z510 Encounter for antineoplastic radiation therapy: Secondary | ICD-10-CM | POA: Diagnosis not present

## 2020-11-03 DIAGNOSIS — C07 Malignant neoplasm of parotid gland: Secondary | ICD-10-CM | POA: Diagnosis not present

## 2020-11-04 ENCOUNTER — Ambulatory Visit: Payer: PPO

## 2020-11-04 ENCOUNTER — Ambulatory Visit
Admission: RE | Admit: 2020-11-04 | Discharge: 2020-11-04 | Disposition: A | Discharge status: Home / Self Care | Payer: PPO | Source: Ambulatory Visit | Attending: Radiation Oncology | Admitting: Radiation Oncology

## 2020-11-04 DIAGNOSIS — Z510 Encounter for antineoplastic radiation therapy: Secondary | ICD-10-CM | POA: Diagnosis not present

## 2020-11-04 DIAGNOSIS — C07 Malignant neoplasm of parotid gland: Secondary | ICD-10-CM | POA: Diagnosis not present

## 2020-11-05 ENCOUNTER — Ambulatory Visit: Payer: PPO

## 2020-11-05 ENCOUNTER — Inpatient Hospital Stay: Payer: PPO

## 2020-11-05 ENCOUNTER — Other Ambulatory Visit: Payer: Self-pay

## 2020-11-05 DIAGNOSIS — C07 Malignant neoplasm of parotid gland: Secondary | ICD-10-CM | POA: Diagnosis not present

## 2020-11-05 LAB — CBC
HCT: 45.1 % (ref 39.0–52.0)
Hemoglobin: 15.1 g/dL (ref 13.0–17.0)
MCH: 30.3 pg (ref 26.0–34.0)
MCHC: 33.5 g/dL (ref 30.0–36.0)
MCV: 90.6 fL (ref 80.0–100.0)
Platelets: 261 10*3/uL (ref 150–400)
RBC: 4.98 MIL/uL (ref 4.22–5.81)
RDW: 13.1 % (ref 11.5–15.5)
WBC: 7 10*3/uL (ref 4.0–10.5)
nRBC: 0 % (ref 0.0–0.2)

## 2020-11-09 ENCOUNTER — Ambulatory Visit: Payer: PPO | Admitting: Internal Medicine

## 2020-11-09 ENCOUNTER — Ambulatory Visit
Admission: RE | Admit: 2020-11-09 | Discharge: 2020-11-09 | Disposition: A | Discharge status: Home / Self Care | Payer: PPO | Source: Ambulatory Visit | Attending: Radiation Oncology | Admitting: Radiation Oncology

## 2020-11-09 ENCOUNTER — Ambulatory Visit: Payer: PPO

## 2020-11-09 ENCOUNTER — Other Ambulatory Visit: Payer: PPO

## 2020-11-09 DIAGNOSIS — C07 Malignant neoplasm of parotid gland: Secondary | ICD-10-CM | POA: Diagnosis not present

## 2020-11-09 DIAGNOSIS — Z510 Encounter for antineoplastic radiation therapy: Secondary | ICD-10-CM | POA: Diagnosis not present

## 2020-11-10 ENCOUNTER — Ambulatory Visit: Payer: PPO

## 2020-11-10 ENCOUNTER — Ambulatory Visit
Admission: RE | Admit: 2020-11-10 | Discharge: 2020-11-10 | Disposition: A | Discharge status: Home / Self Care | Payer: PPO | Source: Ambulatory Visit | Attending: Radiation Oncology | Admitting: Radiation Oncology

## 2020-11-10 DIAGNOSIS — C07 Malignant neoplasm of parotid gland: Secondary | ICD-10-CM | POA: Diagnosis not present

## 2020-11-10 DIAGNOSIS — Z510 Encounter for antineoplastic radiation therapy: Secondary | ICD-10-CM | POA: Diagnosis not present

## 2020-11-11 ENCOUNTER — Other Ambulatory Visit: Payer: Self-pay

## 2020-11-11 ENCOUNTER — Inpatient Hospital Stay: Payer: PPO

## 2020-11-11 ENCOUNTER — Inpatient Hospital Stay (HOSPITAL_BASED_OUTPATIENT_CLINIC_OR_DEPARTMENT_OTHER): Payer: PPO | Admitting: Internal Medicine

## 2020-11-11 ENCOUNTER — Inpatient Hospital Stay: Payer: PPO | Attending: Internal Medicine

## 2020-11-11 ENCOUNTER — Ambulatory Visit: Payer: PPO

## 2020-11-11 ENCOUNTER — Ambulatory Visit
Admission: RE | Admit: 2020-11-11 | Discharge: 2020-11-11 | Disposition: A | Discharge status: Home / Self Care | Payer: PPO | Source: Ambulatory Visit | Attending: Radiation Oncology | Admitting: Radiation Oncology

## 2020-11-11 DIAGNOSIS — Z510 Encounter for antineoplastic radiation therapy: Secondary | ICD-10-CM | POA: Diagnosis not present

## 2020-11-11 DIAGNOSIS — Z87891 Personal history of nicotine dependence: Secondary | ICD-10-CM | POA: Insufficient documentation

## 2020-11-11 DIAGNOSIS — C07 Malignant neoplasm of parotid gland: Secondary | ICD-10-CM

## 2020-11-11 LAB — CBC WITH DIFFERENTIAL/PLATELET
Abs Immature Granulocytes: 0.02 10*3/uL (ref 0.00–0.07)
Basophils Absolute: 0.1 10*3/uL (ref 0.0–0.1)
Basophils Relative: 1 %
Eosinophils Absolute: 0.3 10*3/uL (ref 0.0–0.5)
Eosinophils Relative: 4 %
HCT: 46.3 % (ref 39.0–52.0)
Hemoglobin: 15.5 g/dL (ref 13.0–17.0)
Immature Granulocytes: 0 %
Lymphocytes Relative: 14 %
Lymphs Abs: 0.9 10*3/uL (ref 0.7–4.0)
MCH: 30.3 pg (ref 26.0–34.0)
MCHC: 33.5 g/dL (ref 30.0–36.0)
MCV: 90.4 fL (ref 80.0–100.0)
Monocytes Absolute: 0.6 10*3/uL (ref 0.1–1.0)
Monocytes Relative: 9 %
Neutro Abs: 4.8 10*3/uL (ref 1.7–7.7)
Neutrophils Relative %: 72 %
Platelets: 231 10*3/uL (ref 150–400)
RBC: 5.12 MIL/uL (ref 4.22–5.81)
RDW: 13 % (ref 11.5–15.5)
WBC: 6.7 10*3/uL (ref 4.0–10.5)
nRBC: 0 % (ref 0.0–0.2)

## 2020-11-11 LAB — COMPREHENSIVE METABOLIC PANEL
ALT: 18 U/L (ref 0–44)
AST: 25 U/L (ref 15–41)
Albumin: 3.8 g/dL (ref 3.5–5.0)
Alkaline Phosphatase: 61 U/L (ref 38–126)
Anion gap: 11 (ref 5–15)
BUN: 12 mg/dL (ref 8–23)
CO2: 24 mmol/L (ref 22–32)
Calcium: 9.2 mg/dL (ref 8.9–10.3)
Chloride: 100 mmol/L (ref 98–111)
Creatinine, Ser: 0.96 mg/dL (ref 0.61–1.24)
GFR, Estimated: >60 mL/min (ref >60)
Glucose, Bld: 130 mg/dL — ABNORMAL HIGH (ref 70–99)
Potassium: 3.4 mmol/L — ABNORMAL LOW (ref 3.5–5.1)
Sodium: 135 mmol/L (ref 135–145)
Total Bilirubin: 0.8 mg/dL (ref 0.3–1.2)
Total Protein: 7.1 g/dL (ref 6.5–8.1)

## 2020-11-11 NOTE — Progress Notes (Signed)
[facility] NOTE  Patient Care Team: Jinny Sanders, MD as PCP - General End, Harrell Gave, MD as PCP - Cardiology (Cardiology) Hayden Pedro, MD as Consulting Physician (Ophthalmology) Lorelee Cover., MD as Referring Physician (Ophthalmology) Oneta Rack, MD as Referring Physician (Dermatology) Ladene Artist, MD as Consulting Physician (Gastroenterology) Rosalene Billings., MD as Referring Physician (Dentistry) Debbora Dus, [facility] as Pharmacist (Pharmacist)  CHIEF COMPLAINTS/PURPOSE OF CONSULTATION: parotid cancer  #  Oncology History Overview Note  Addendum    Upon clinician request, additional immunostains are performed at [facility] on block F2, with the following results in the tumor cells:   HER2 ([place], clone [id number]) Interpretation: Positive IHC Score: 3+ (using the ASCO/CAP breast biomarker scoring guidelines; there are no consensus guideline for scoring HER2 in salivary duct carcinoma)   This slide has also been reviewed by Dr. Shelby Mattocks [facility] breast pathology) who concurs.  Addendum electronically signed by Tomasa Blase, MD on 09/03/2020 at  3:21 PM  Diagnosis    A. "Tissue around stylomastoid foramen"  - Fibrovascular tissue extensively involved by carcinoma   B. "Main trunk facial nerve"  - Nerve and fibrous tissue extensively involved by carcinoma     C. "Distal facial nerve" - Nerve tissue; negative for carcinoma     D. "Proximal mastoid nerve margin" - Nerve tissue; negative for carcinoma   E: Left upper superficial parotid, parotidectomy - Benign parotid parenchyma with focal acute and chronic inflammation - Negative for carcinoma   F: Left inferior parotid with sternocleidomastoid muscle, resection - High-grade salivary gland carcinoma, most consistent with salivary duct carcinoma (see comment and synoptic report for further details) - Cauterized tumor present at inked deep surgical margin within fibrous tissue  adjacent to skeletal muscle - Metastatic carcinoma in 1.1 cm nodal conglomerate and 4 of 4 intact lymph nodes (at least 5/5); positive for extranodal extension    G: Lymph node, left neck level 5, lymphadenectomy - Benign adipose; no lymph nodes or carcinoma identified   H: Lymph node, left neck level 1, lymphadenectomy - Metastatic carcinoma in 1 of 5 lymph nodes (1/5); maximum size of metastasis 2 mm; negative for extranodal extension.   I: Left omohyoid, excision - Benign skeletal muscle; negative for carcinoma   J: Left deep lobe parotid, excision - Benign parotid tissue; negative for carcinoma   K: Parotid tissue, biopsy - Benign parotid tissue - No metastatic carcinoma identified in 1 lymph nodes (0/1)   L: Left tissue over mastoid, biopsy - Benign parotid parenchyma, dense fibrous tissue, and bone; negative for carcinoma   M: "Stylomastoid foramen stitch on proximal nerve", excision - Large nerve, negative for perineural/intraneural carcinoma; stitched margin negative - Attached fibrous tissue and parotid parenchyma positive for carcinoma, focally invading around but not within large nerve segment, with crushed/cauterized tumor present at tissue edges   N: Tissue over styloid process, biopsy - Benign parotid tissue; negative for carcinoma   O: Posterior digastric margin, biopsy - Benign skeletal muscle; negative for carcinoma   P: Left neck dissections level 1 through 5, lymphadenectomy - Metastatic carcinoma in 11 of 26 lymph nodes (11/26); maximum size of metastasis 20 mm; two nodes (16 mm and 14 mm) positive for extranodal extension (ENEma, up to 4 mm beyond capsule)   Diagnosis: 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 -----------------------------------------------------------------   07/13/10: Right parathyroidectomy Pathology: Hypercellular parathyroid gland   -------------------------------------------------------------------------------------   # MARCH 2022- CT chest- [[facility]] Subcentimeter pulmonary nodules measuring up to 0.8 cm, indeterminate. No comparison available. Attention on follow-up is recommended.  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 Stage: pT4aN3b-stage IVb.  HER-2 positive; androgen receptor positive.]  PET scan-negative for distant metastatic disease.   # CAD [card]; DM- 2; CKD-III     Cancer of parotid gland ([place])  09/11/2020 Initial Diagnosis   Cancer of parotid gland ([place])   09/16/2020 Cancer Staging   Staging form: Major Salivary Glands, AJCC 8th Edition - Pathologic: Stage IVB (pT4a, pN3b, cM0) - Signed by Cammie Sickle, MD on 09/16/2020      HISTORY OF PRESENTING ILLNESS:  Miguel Bradley 80 y.o.  male pleasant patient with locally advanced high-grade salivary gland/parotid cancer -T4AN3-stage IVb is currently on adjuvant radiation.  Patient admits to poor taste.  Also admits to poor appetite.  Denies any significant pain in his throat-he has been using salt and soda water rinses.   Denies any diabetes or abdominal pain.  Review of Systems  Constitutional: Positive for malaise/fatigue and weight loss. Negative for chills, diaphoresis and fever.  HENT: Negative for nosebleeds and sore throat.   Eyes: Negative for double vision.  Respiratory: Negative for cough, hemoptysis, sputum production, shortness of breath and wheezing.   Cardiovascular:  Negative for chest pain, palpitations, orthopnea and leg swelling.  Gastrointestinal: Negative for abdominal pain, blood in stool, constipation, diarrhea, heartburn, melena, nausea and vomiting.  Genitourinary: Negative for dysuria, frequency and urgency.  Musculoskeletal: Positive for joint pain. Negative for back pain.  Skin: Negative.  Negative for itching and rash.  Neurological: Negative for dizziness, tingling, focal weakness, weakness and headaches.  Endo/Heme/Allergies: Does not bruise/bleed easily.  Psychiatric/Behavioral: Negative for depression. The patient is not nervous/anxious and does not have insomnia.      MEDICAL HISTORY:  Past Medical History:  Diagnosis Date  . Anemia   . Basal cell carcinoma of face   . Cataract   . Diabetes mellitus without complication ([place])   . Glaucoma   . Hypercalcemia   . Hyperparathyroidism, unspecified ([place])   . Impotence of organic origin   . Other testicular hypofunction   . Pure hyperglyceridemia   . Special screening for malignant neoplasm of prostate   . Special screening for malignant neoplasms, colon   . Unspecified essential hypertension     SURGICAL HISTORY: Past Surgical History:  Procedure Laterality Date  . COLONOSCOPY  06/13/2018   Dr. Lucio Edward  . LEFT HEART CATH AND CORONARY ANGIOGRAPHY Left 03/16/2020   Procedure: LEFT HEART CATH AND CORONARY ANGIOGRAPHY;  Surgeon: Nelva Bush, MD;  Location: [facility];  Service: Cardiovascular;  Laterality: Left;  . PARATHYROIDECTOMY  2012  . skin cancer removal     Dr. Sharlett Iles  . TRIGGER FINGER RELEASE Left 2008    SOCIAL HISTORY: Social History   Socioeconomic History  . Marital status: Married    Spouse name: Not on file  . Number of children: 1  . Years of education: Not on file  . Highest education level: Not on file  Occupational History  .  Occupation: Water quality scientist at [employer]  . Smoking status: Former Smoker    Years: 25.00     Quit date: 06/12/1978    Years since quitting: 42.4  . Smokeless tobacco: Never Used  Vaping Use  . Vaping Use: Never used  Substance and Sexual Activity  . Alcohol use: Yes    Comment: 1 beer every few weeks  . Drug use: No  . Sexual activity: Not Currently  Other Topics Concern  . Not on file  Social History Narrative   No exercise, plays golf. Lives in [place]; self- [place]; daughter. puchasing dept in [place]. Rare alcohol. Quit smoking in 1980.    Social Determinants of Health   Financial Resource Strain: Low Risk   . Difficulty of Paying Living Expenses: Not very hard  Food Insecurity: Not on file  Transportation Needs: Not on file  Physical Activity: Not on file  Stress: Not on file  Social Connections: Not on file  Intimate Partner Violence: Not on file    FAMILY HISTORY: Family History  Problem Relation Age of Onset  . Hypertension Mother   . Dementia Mother   . Diabetes Other        aunt  . Hypertension Brother   . Hypertension Brother   . Hypertension Brother   . Hyperlipidemia Brother   . Hyperlipidemia Brother   . Hyperlipidemia Brother   . Hypertension Sister   . Bladder Cancer Sister   . Hyperlipidemia Sister   . Colon cancer Neg Hx   . Esophageal cancer Neg Hx   . Rectal cancer Neg Hx   . Stomach cancer Neg Hx     ALLERGIES:  has No Known Allergies.  MEDICATIONS:  Current Outpatient Medications  Medication Sig Dispense Refill  . acetaminophen (TYLENOL) 500 MG tablet Take 500 mg by mouth every 6 (six) hours as needed.    Marland Kitchen aspirin EC 81 MG tablet Take 81 mg by mouth daily.    . dorzolamide-timolol (COSOPT) 22.3-6.8 MG/ML ophthalmic solution Place 1 drop into both eyes 2 (two) times daily.     . fenofibrate 160 MG tablet TAKE 1 TABLET BY MOUTH EVERY DAY 90 tablet 3  . ferrous sulfate 325 (65 FE) MG tablet Take 325 mg by mouth daily with breakfast.    . hydrochlorothiazide (HYDRODIURIL) 25 MG tablet TAKE 1 TABLET BY MOUTH EVERY DAY 90 tablet 3  .  latanoprost (XALATAN) 0.005 % ophthalmic solution Place 1 drop into both eyes at bedtime.     . metFORMIN (GLUCOPHAGE) 500 MG tablet TAKE ONE TABLET BY MOUTH TWICE DAILY WITH A MEAL 180 tablet 0  . rosuvastatin (CRESTOR) 20 MG tablet TAKE 1 TABLET BY MOUTH EVERY DAY 90 tablet 3  . nitroGLYCERIN (NITROSTAT) 0.4 MG SL tablet Place 1 tablet (0.4 mg total) under the tongue every 5 (five) minutes as needed for chest pain. (Patient not taking: No sig reported) 25 tablet prn   No current facility-administered medications for this visit.      Marland Kitchen  PHYSICAL EXAMINATION: ECOG PERFORMANCE STATUS: 0 - Asymptomatic  Vitals:   11/11/20 1039  BP: 118/67  Pulse: (!) 52  Resp: 18  Temp: 97.8 F (36.6 C)  SpO2: 98%   Filed Weights   11/11/20 1039  Weight: 89.4 kg    Physical Exam Constitutional:      Comments: Ambulating dependently.  Accompanied by his daughter.  HENT:     Head: Normocephalic and atraumatic.     Mouth/Throat:  Pharynx: No oropharyngeal exudate.  Eyes:     Pupils: Pupils are equal, round, and reactive to light.  Neck:     Comments: Incision from the left neck dissection; parotidectomy noted.  Well-healing.  No evidence of any infection. Cardiovascular:     Rate and Rhythm: Normal rate and regular rhythm.  Pulmonary:     Effort: No respiratory distress.     Breath sounds: No wheezing.  Abdominal:     General: Bowel sounds are normal. There is no distension.     Palpations: Abdomen is soft. There is no mass.     Tenderness: There is no abdominal tenderness. There is no guarding or rebound.  Musculoskeletal:        General: No tenderness. Normal range of motion.     Cervical back: Normal range of motion and neck supple.  Skin:    General: Skin is warm.  Neurological:     Mental Status: He is alert and oriented to person, place, and time.     Comments: Left facial droop noted-post surgery.  Psychiatric:        Mood and Affect: Affect normal.      LABORATORY  DATA:  I have reviewed the data as listed Lab Results  Component Value Date   WBC 6.7 11/11/2020   HGB 15.5 11/11/2020   HCT 46.3 11/11/2020   MCV 90.4 11/11/2020   PLT 231 11/11/2020   Recent Labs    02/23/20 1201 03/10/20 1636 07/01/20 0748 09/13/20 1208 11/11/20 1015  NA 138 139 136 138 135  K 4.3 3.8 4.4 4.4 3.4*  CL 101 104 101 102 100  CO2 _0 GLUCOSE 122* 108* 131* 118* 130*  BUN _1 CREATININE 1.13 1.31* 1.39 1.19 0.96  CALCIUM 9.8 9.6 9.3 8.8* 9.2  GFRNONAA 62 52*  --  >60 >60  GFRAA 72 >60  --   --   --   PROT  --   --  7.0 7.2 7.1  ALBUMIN  --   --  4.1 3.6 3.8  AST  --   --  _2 ALT  --   --  _3 ALKPHOS  --   --  55 69 61  BILITOT  --   --  0.7 0.6 0.8    RADIOGRAPHIC STUDIES: I have personally reviewed the radiological images as listed and agreed with the findings in the report. No results found.  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 Stage: pT4aN3b-stage IVb.  HER-2 positive; androgen receptor positive.]  PET scan-negative for distant metastatic disease.  Recommend NGS-next visit.   #Patient currently undergoing adjuvant radiation-[until July 16]; tolerating well except for profound weight loss [see below].  Labs today reviewed normal.  #Poor taste/mucositis/weight loss of 15 pounds in the last 1 month.  Recommend continue/increase in protein/boost.  Refer to dietitian.  Continue salt and soda water rinses.  # CKD- stage III-  [1 BID; tylenol]; recommend avoiding Aleve; recommend Tylenol/oxycodone as needed.  # DISPOSITION:  # referal to Joli re: weight loss # follow up in 2 months- MD; labs-  cbc/cmp; Dr.B  All questions were answered. The patient knows  to call the clinic with any problems, questions or concerns.    Cammie Sickle, MD 11/11/2020 2:25 PM

## 2020-11-11 NOTE — Assessment & Plan Note (Addendum)
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 Stage: pT4aN3b-stage IVb.  HER-2 positive; androgen receptor positive.]  PET scan-negative for distant metastatic disease.  Recommend NGS-next visit.   #Patient currently undergoing adjuvant radiation-[until July 16]; tolerating well except for profound weight loss [see below].  Labs today reviewed normal.  #Poor taste/mucositis/weight loss of 15 pounds in the last 1 month.  Recommend continue/increase in protein/boost.  Refer to dietitian.  Continue salt and soda water rinses.  # CKD- stage III-  [1 BID; tylenol]; recommend avoiding Aleve; recommend Tylenol/oxycodone as needed.  # DISPOSITION:  # referal to Joli re: weight loss # follow up in 2 months- MD; labs- cbc/cmp; Dr.B

## 2020-11-12 ENCOUNTER — Ambulatory Visit
Admission: RE | Admit: 2020-11-12 | Discharge: 2020-11-12 | Disposition: A | Discharge status: Home / Self Care | Payer: PPO | Source: Ambulatory Visit | Attending: Radiation Oncology | Admitting: Radiation Oncology

## 2020-11-12 ENCOUNTER — Ambulatory Visit: Payer: PPO

## 2020-11-12 ENCOUNTER — Inpatient Hospital Stay: Payer: PPO

## 2020-11-12 DIAGNOSIS — C07 Malignant neoplasm of parotid gland: Secondary | ICD-10-CM | POA: Diagnosis not present

## 2020-11-12 DIAGNOSIS — Z510 Encounter for antineoplastic radiation therapy: Secondary | ICD-10-CM | POA: Diagnosis not present

## 2020-11-15 ENCOUNTER — Ambulatory Visit: Payer: PPO

## 2020-11-15 ENCOUNTER — Telehealth: Payer: Self-pay

## 2020-11-15 ENCOUNTER — Ambulatory Visit
Admission: RE | Admit: 2020-11-15 | Discharge: 2020-11-15 | Disposition: A | Discharge status: Home / Self Care | Payer: PPO | Source: Ambulatory Visit | Attending: Radiation Oncology | Admitting: Radiation Oncology

## 2020-11-15 DIAGNOSIS — C07 Malignant neoplasm of parotid gland: Secondary | ICD-10-CM | POA: Diagnosis not present

## 2020-11-15 DIAGNOSIS — Z510 Encounter for antineoplastic radiation therapy: Secondary | ICD-10-CM | POA: Diagnosis not present

## 2020-11-15 NOTE — Chronic Care Management (AMB) (Addendum)
Chronic Care Management Pharmacy Assistant   Name: Miguel Bradley  MRN: [id number] DOB: [birth year]  Reason for Encounter: Disease State HTN  Recent office visits:  None since last CCM contact  Recent consult visits:  11/15/20-Oncology -radiation treatment 11/11/20-Oncology- labs ordered no medication changes  follow up 2 months 5/4/22[facility] ENT telemedicine no medication changes follow up 3 months 10/04/20- Oncology-labs ordered no medication changes 09/29/20- otolaryngology use refresh tears for eye lubrication follow up 3 months  Hospital visits:  None in previous 6 months  Medications: Outpatient Encounter Medications as of 11/15/2020  Medication Sig   acetaminophen (TYLENOL) 500 MG tablet Take 500 mg by mouth every 6 (six) hours as needed.   aspirin EC 81 MG tablet Take 81 mg by mouth daily.   dorzolamide-timolol (COSOPT) 22.3-6.8 MG/ML ophthalmic solution Place 1 drop into both eyes 2 (two) times daily.    fenofibrate 160 MG tablet TAKE 1 TABLET BY MOUTH EVERY DAY   ferrous sulfate 325 (65 FE) MG tablet Take 325 mg by mouth daily with breakfast.   hydrochlorothiazide (HYDRODIURIL) 25 MG tablet TAKE 1 TABLET BY MOUTH EVERY DAY   latanoprost (XALATAN) 0.005 % ophthalmic solution Place 1 drop into both eyes at bedtime.    metFORMIN (GLUCOPHAGE) 500 MG tablet TAKE ONE TABLET BY MOUTH TWICE DAILY WITH A MEAL   nitroGLYCERIN (NITROSTAT) 0.4 MG SL tablet Place 1 tablet (0.4 mg total) under the tongue every 5 (five) minutes as needed for chest pain. (Patient not taking: No sig reported)   rosuvastatin (CRESTOR) 20 MG tablet TAKE 1 TABLET BY MOUTH EVERY DAY   No facility-administered encounter medications on file as of 11/15/2020.   Recent Office Vitals: BP Readings from Last 3 Encounters:  11/11/20 118/67  10/04/20 138/75  09/13/20 (P) 140/69   Pulse Readings from Last 3 Encounters:  11/11/20 (!) 52  10/04/20 (!) 52  09/13/20 (!) (P) 58    Wt Readings from Last 3 Encounters:   11/11/20 197 lb 3 oz (89.4 kg)  10/04/20 212 lb (96.2 kg)  09/13/20 (P) 212 lb 4.8 oz (96.3 kg)     Kidney Function Lab Results  Component Value Date/Time   CREATININE 0.96 11/11/2020 10:15 AM   CREATININE 1.19 09/13/2020 12:08 PM   GFR 48.41 (L) 07/01/2020 07:48 AM   GFRNONAA >60 11/11/2020 10:15 AM   GFRAA >60 03/10/2020 04:36 PM    BMP Latest Ref Rng & Units 11/11/2020 09/13/2020 07/01/2020  Glucose 70 - 99 mg/dL 130(H) 118(H) 131(H)  BUN 8 - 23 mg/dL 12 18 17   Creatinine 0.61 - 1.24 mg/dL 0.96 1.19 1.39  BUN/Creat Ratio 10 - 24 - - -  Sodium 135 - 145 mmol/L 135 138 136  Potassium 3.5 - 5.1 mmol/L 3.4(L) 4.4 4.4  Chloride 98 - 111 mmol/L 100 102 101  CO2 22 - 32 mmol/L 24 26 29   Calcium 8.9 - 10.3 mg/dL 9.2 8.8(L) 9.3    Current antihypertensive regimen:  HCTZ 25 mg - 1 tablet daily  Patient verbally confirms he is taking the above medications as directed. Yes  How often are you checking your Blood Pressure? infrequently the patient reports he has not taken his BP in a long time   Wrist or arm cuff: He has an arm cuff and will start recording readings again Caffeine intake: He likes his regular coffee in the morning Salt intake: Limits added salt OTC medications including pseudoephedrine or NSAIDs? No  What recent interventions/DTPs have been made  by any provider to improve Blood Pressure control since last CPP Visit: none identified  Any recent hospitalizations or ED visits since last visit with CPP? No  What diet changes have been made to improve Blood Pressure Control? None identified as the patient is having radiation treatments for cancer and currently has no appetite  What exercise is being done to improve your Blood Pressure Control? None identified at this time as the patient is having radiation treatments for cancer and the patient states he has no energy for exericse   Adherence Review: Is the patient currently on ACE/ARB medication? No Does the patient  have >5 day gap between last estimated fill dates? No   Star Rating Drugs:  Medication:  Last Fill: Day Supply Metformin 500mg  09/30/20 90     Rosuvastatin 20mg  09/03/20 90  The patient had some concern about his fasting BG  112,113,110  The patient has a follow up with Dr.Bedsole 01/07/21. He has some concerns about his medications with recent weight loss and decreased appetite. Sharyn Lull will contact him to discuss.   Follow-Up:  Pharmacist Review  Debbora Dus, CPP notified  Avel Sensor, [place] Assistant [phone]  I have reviewed the care management and care coordination activities outlined in this encounter and I am certifying that I agree with the content of this note. No further action required.  Debbora Dus, PharmD Clinical Pharmacist [facility] at [facility] [phone]

## 2020-11-16 ENCOUNTER — Ambulatory Visit: Payer: PPO

## 2020-11-16 ENCOUNTER — Ambulatory Visit
Admission: RE | Admit: 2020-11-16 | Discharge: 2020-11-16 | Disposition: A | Discharge status: Home / Self Care | Payer: PPO | Source: Ambulatory Visit | Attending: Radiation Oncology | Admitting: Radiation Oncology

## 2020-11-16 DIAGNOSIS — C07 Malignant neoplasm of parotid gland: Secondary | ICD-10-CM | POA: Diagnosis not present

## 2020-11-16 DIAGNOSIS — Z510 Encounter for antineoplastic radiation therapy: Secondary | ICD-10-CM | POA: Diagnosis not present

## 2020-11-17 ENCOUNTER — Inpatient Hospital Stay: Payer: PPO

## 2020-11-17 ENCOUNTER — Ambulatory Visit
Admission: RE | Admit: 2020-11-17 | Discharge: 2020-11-17 | Disposition: A | Discharge status: Home / Self Care | Payer: PPO | Source: Ambulatory Visit | Attending: Radiation Oncology | Admitting: Radiation Oncology

## 2020-11-17 DIAGNOSIS — Z510 Encounter for antineoplastic radiation therapy: Secondary | ICD-10-CM | POA: Diagnosis not present

## 2020-11-17 DIAGNOSIS — C07 Malignant neoplasm of parotid gland: Secondary | ICD-10-CM | POA: Diagnosis not present

## 2020-11-17 NOTE — Progress Notes (Signed)
Nutrition Assessment   Reason for Assessment:  Weight loss   ASSESSMENT:  80 year old male with left parotid gland, stage IV cancer.  S/p left total parotidectomy with facial nerve sacrifice, lateral temporal bone resection, and reconstruction with left anterolateral thigh free flap.  Past medical history of CKD stage III, CAD, DM.  Patient receiving radiation (last treatment 6/14)  Met with patient prior to radiation this am. Patient reports poor appetite, dry mouth, little sore throat and no taste.  Reports that he does not do much cooking, wife passed about 7 years ago from cancer.  Usually has cereal for breakfast sometimes eggs, bacon or sausage.  Drinks shakes ensure/boost 1-2 times per day if he remembers.  Last night ate cereal again for supper.  Likes hot dogs, chicken, beans, potatoes but only eating few bites then does not want any more.     Medications: fe sulfate, metformin   Labs: glucose 130, K 3.4   Anthropometrics:   Height: 69.5 inches Weight: 196 lb 6 oz today in clinic 212 lb 4/25 BMI: 28  7% weight loss in the last month, significant   Estimated Energy Needs  Kcals: 2200-2600 Protein: 110-130 g Fluid: 2.2 L   NUTRITION DIAGNOSIS: Inadequate oral intake related to side effects from cancer treatment as evidenced by 7% weight loss in the last month and poor appetite with taste alterations and dry mouth    INTERVENTION:  Encouraged patient to drink 4, 350 calorie or higher shakes daily. Samples given of ensure complete, boost plus, [employer] shake and [employer] 1.0 chocolate along with coupons. Discussed mouth care and strategies to help with dry mouth. Handout provided Discussed ways to increase calories and protein. Handout given Also discussed soft moist protein foods. Handout given Contact information given   MONITORING, EVALUATION, GOAL: weight trends, intake   Next Visit: Tuesday, June 14 before radiation  Miguel Bradley B. Zenia Resides, [place],  [place] Registered Dietitian [phone] (mobile)

## 2020-11-18 ENCOUNTER — Ambulatory Visit
Admission: RE | Admit: 2020-11-18 | Discharge: 2020-11-18 | Disposition: A | Discharge status: Home / Self Care | Payer: PPO | Source: Ambulatory Visit | Attending: Radiation Oncology | Admitting: Radiation Oncology

## 2020-11-18 ENCOUNTER — Telehealth: Payer: Self-pay

## 2020-11-18 DIAGNOSIS — Z510 Encounter for antineoplastic radiation therapy: Secondary | ICD-10-CM | POA: Diagnosis not present

## 2020-11-18 DIAGNOSIS — C07 Malignant neoplasm of parotid gland: Secondary | ICD-10-CM | POA: Diagnosis not present

## 2020-11-18 NOTE — Telephone Encounter (Signed)
Patient would like to know if he needs to move up his appointment with Dr. Diona Browner scheduled for 01/07/21.  He reports oncology wanted him to check with PCP to ensure his chronic medications (DM, HTN, etc) are still safe with recent weight loss. He has lost ~ 20-25 lbs since starting radiation. Current weight 196 lbs.   Reviewed home BG. Reports fasting average 100-115. Lowest BG 100. No s/s of hypoglcyemia. DM meds include metformin 1000 mg/day.  Reviewed BP - reports this is checked weekly by oncology and has been good. Not checking at home. Denies any s/s of hypotension. BP meds include HCTZ 25 mg daily.  He is also seeing a registered dietician to assist with weight gain. Drinking Ensure to add calories.   Please advise if you would like to see him sooner.  Debbora Dus, PharmD Clinical Pharmacist [facility] at [facility] [phone]

## 2020-11-18 NOTE — Telephone Encounter (Signed)
Miguel Bradley notified as instructed by telephone.  He states if everything looks okay he would prefer to leave appointment for the end of July.

## 2020-11-18 NOTE — Telephone Encounter (Signed)
BP Readings from Last 3 Encounters:  11/11/20 118/67  10/04/20 138/75  09/13/20 (P) 140/69     CBGs and BPs also well controlled per records.  Okay to move forward but  does not need to be  urgently... can move to last week in June if pt would like and opening available.

## 2020-11-19 ENCOUNTER — Inpatient Hospital Stay: Payer: PPO

## 2020-11-19 ENCOUNTER — Ambulatory Visit
Admission: RE | Admit: 2020-11-19 | Discharge: 2020-11-19 | Disposition: A | Discharge status: Home / Self Care | Payer: PPO | Source: Ambulatory Visit | Attending: Radiation Oncology | Admitting: Radiation Oncology

## 2020-11-19 DIAGNOSIS — C07 Malignant neoplasm of parotid gland: Secondary | ICD-10-CM | POA: Diagnosis not present

## 2020-11-19 DIAGNOSIS — Z510 Encounter for antineoplastic radiation therapy: Secondary | ICD-10-CM | POA: Diagnosis not present

## 2020-11-19 LAB — CBC
HCT: 44.9 % (ref 39.0–52.0)
Hemoglobin: 14.9 g/dL (ref 13.0–17.0)
MCH: 30.2 pg (ref 26.0–34.0)
MCHC: 33.2 g/dL (ref 30.0–36.0)
MCV: 90.9 fL (ref 80.0–100.0)
Platelets: 270 10*3/uL (ref 150–400)
RBC: 4.94 MIL/uL (ref 4.22–5.81)
RDW: 12.9 % (ref 11.5–15.5)
WBC: 7.4 10*3/uL (ref 4.0–10.5)
nRBC: 0 % (ref 0.0–0.2)

## 2020-11-22 ENCOUNTER — Ambulatory Visit
Admission: RE | Admit: 2020-11-22 | Discharge: 2020-11-22 | Disposition: A | Discharge status: Home / Self Care | Payer: PPO | Source: Ambulatory Visit | Attending: Radiation Oncology | Admitting: Radiation Oncology

## 2020-11-22 ENCOUNTER — Ambulatory Visit: Payer: PPO

## 2020-11-22 DIAGNOSIS — Z510 Encounter for antineoplastic radiation therapy: Secondary | ICD-10-CM | POA: Diagnosis not present

## 2020-11-22 DIAGNOSIS — C07 Malignant neoplasm of parotid gland: Secondary | ICD-10-CM | POA: Diagnosis not present

## 2020-11-23 ENCOUNTER — Other Ambulatory Visit: Payer: Self-pay

## 2020-11-23 ENCOUNTER — Ambulatory Visit
Admission: RE | Admit: 2020-11-23 | Discharge: 2020-11-23 | Disposition: A | Discharge status: Home / Self Care | Payer: PPO | Source: Ambulatory Visit | Attending: Radiation Oncology | Admitting: Radiation Oncology

## 2020-11-23 ENCOUNTER — Ambulatory Visit: Payer: PPO

## 2020-11-23 ENCOUNTER — Inpatient Hospital Stay: Payer: PPO

## 2020-11-23 DIAGNOSIS — C07 Malignant neoplasm of parotid gland: Secondary | ICD-10-CM | POA: Diagnosis not present

## 2020-11-23 DIAGNOSIS — Z510 Encounter for antineoplastic radiation therapy: Secondary | ICD-10-CM | POA: Diagnosis not present

## 2020-11-23 NOTE — Progress Notes (Signed)
Nutrition Follow-up:  Patient with left parotid gland, stage IV cancer.  S/p left total parotidectomy with facial nerve sacrifice, lateral temporal bone resection, and reconstruction with left anterolateral thigh free flap.  Patient for last radiation treatment today (6/14)  Met with patient prior to radiation this am.  Patient reports appetite is still not good.  Reports ate 1/2 egg and bacon sandwich yesterday. Had a hard time chewing the bread. "I chew and chew and chew and then just can't chew anymore."  Reports that he has been trying to drink 4 of ensure plus shakes (350 calories). Did not like the [employer]      Medications: reviewed  Labs: no new  Anthropometrics:   Weight 195 lb 4 oz today in clinic  196 lb 6 oz on 6/8 212 lb on 4/25   NUTRITION DIAGNOSIS: Inadequate oral intake continues   INTERVENTION:  Patient to drink 4-6, 350 calorie shakes while oral intake is poor to provide calories and protein. Complimentary case of ensure plus given to patient today. Discussed snack foods high in calories and protein. Handout provided.  Encouraged patient to nibble on soft foods    MONITORING, EVALUATION, GOAL: weight trends, intake   NEXT VISIT: phone call Tuesday, June Hixton Zenia Resides, [place], [place] Registered Dietitian [phone] (mobile)

## 2020-12-06 ENCOUNTER — Other Ambulatory Visit: Payer: Self-pay | Admitting: Family Medicine

## 2020-12-07 ENCOUNTER — Inpatient Hospital Stay: Payer: PPO

## 2020-12-07 NOTE — Progress Notes (Signed)
Nutrition Follow-up:  Patient with left parotid gland, stage IV cancer.  Patient has completed radiation.   Spoke with patient via phone for nutrition follow-up.  Patient reports that he is eating more solid foods and relying less on shakes.  Drinking about 3 shakes per day.  Eating spaghetti, beans and franks, peanut butter toast with milk, little Debbie snack cakes. Taste is not back yet. Reports that he feels good.     Reports weight has been stable on home scale for the last 3 days at 188 lb  Anthropometrics:   Weight 188 lb on home scale  195 lb on 6/14   NUTRITION DIAGNOSIS: Inadequate oral intake continues   INTERVENTION:  Provided examples of foods high in calories and protein and softer in texture.   Recommend shakes in between meals for added nutrition, weight still declining     MONITORING, EVALUATION, GOAL: weight trends, intake   NEXT VISIT: Wednesday, July 20th after MD f/u  Moraima Burd B. Zenia Resides, [place], [place] Registered Dietitian [phone] (mobile)

## 2020-12-09 ENCOUNTER — Other Ambulatory Visit: Payer: Self-pay

## 2020-12-09 ENCOUNTER — Encounter (INDEPENDENT_AMBULATORY_CARE_PROVIDER_SITE_OTHER): Payer: PPO | Admitting: Ophthalmology

## 2020-12-09 DIAGNOSIS — H2702 Aphakia, left eye: Secondary | ICD-10-CM

## 2020-12-09 DIAGNOSIS — H35372 Puckering of macula, left eye: Secondary | ICD-10-CM

## 2020-12-09 DIAGNOSIS — H43813 Vitreous degeneration, bilateral: Secondary | ICD-10-CM | POA: Diagnosis not present

## 2020-12-09 DIAGNOSIS — H2511 Age-related nuclear cataract, right eye: Secondary | ICD-10-CM | POA: Diagnosis not present

## 2020-12-09 DIAGNOSIS — H35033 Hypertensive retinopathy, bilateral: Secondary | ICD-10-CM | POA: Diagnosis not present

## 2020-12-09 DIAGNOSIS — I10 Essential (primary) hypertension: Secondary | ICD-10-CM | POA: Diagnosis not present

## 2020-12-16 ENCOUNTER — Telehealth: Payer: Self-pay | Admitting: *Deleted

## 2020-12-16 NOTE — Telephone Encounter (Signed)
Miguel Bradley left voicemail on triage line stating he has been having pain on the left side of his abdomen for several days and is requesting an appointment with Dr. Diona Browner.  Spoke with Mr. Mozer.  He states his pain is on his left side below his rib cage. He states he has tried everything he knows to do and nothing has helped.  He states he started drinking Boost since undergoing radiation, which he knows can be constipating.  He states he BMs are not regular.  He states he has also lost a lot of weight. Appointment scheduled with Dr. Diona Browner tomorrow 12/17/2020 at 10:40 am.  ER precautions given.  FYI to Dr. Diona Browner.

## 2020-12-17 ENCOUNTER — Ambulatory Visit (INDEPENDENT_AMBULATORY_CARE_PROVIDER_SITE_OTHER): Payer: PPO | Admitting: Family Medicine

## 2020-12-17 ENCOUNTER — Other Ambulatory Visit: Payer: Self-pay

## 2020-12-17 ENCOUNTER — Encounter: Payer: Self-pay | Admitting: Family Medicine

## 2020-12-17 VITALS — BP 116/68 | HR 74 | Temp 97.1°F | Ht 69.5 in | Wt 189.0 lb

## 2020-12-17 DIAGNOSIS — C07 Malignant neoplasm of parotid gland: Secondary | ICD-10-CM | POA: Diagnosis not present

## 2020-12-17 DIAGNOSIS — R1032 Left lower quadrant pain: Secondary | ICD-10-CM | POA: Diagnosis not present

## 2020-12-17 DIAGNOSIS — R630 Anorexia: Secondary | ICD-10-CM | POA: Diagnosis not present

## 2020-12-17 DIAGNOSIS — K5904 Chronic idiopathic constipation: Secondary | ICD-10-CM

## 2020-12-17 MED ORDER — MIRTAZAPINE 15 MG PO TABS: 15.0000 mg | ORAL_TABLET | Freq: Every day | ORAL | 11 refills | Status: DC

## 2020-12-17 NOTE — Patient Instructions (Addendum)
Increase water.  Continue metamucil fiber.  Stop dulcolax.  Start miralax once to twice daily.  Start a trial of Remeron at bedtime

## 2020-12-17 NOTE — Progress Notes (Signed)
Patient ID: Miguel Bradley, male    DOB: 12-27-1940, 80 y.o.   MRN: [id number]  This visit was conducted in person.  BP 116/68   Pulse 74   Temp (!) 97.1 F (36.2 C) (Temporal)   Ht 5' 9.5" (1.765 m)   Wt 189 lb (85.7 kg)   SpO2 97%   BMI 27.51 kg/m    CC: Chief Complaint  Patient presents with   Abdominal Pain    Lower left     Subjective:   HPI: Miguel Bradley is a 80 y.o. male presenting on 12/17/2020 for Abdominal Pain (Lower left/)   Recent Dx and treatment of parotid gland cancer.  Followed by Dr. Burlene Arnt. Last note from 11/11/2020 reviewed. 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 Stage: pT4aN3b-stage IVb.  Just completed adjuvant radiation.  Has lost weight with surgery. Wt Readings from Last 3 Encounters:  12/17/20 189 lb (85.7 kg)  11/23/20 195 lb 4 oz (88.6 kg)  11/17/20 196 lb 6 oz (89.1 kg)  Body mass index is 27.51 kg/m.   He has hard time eating and chewing since surgery given surgical changes.  Has decreased appetite and no taste buds.   He  feels like he does not care about eating.  He is drinking a lot of boost... caused constipation.  He has not had regular BM in months. He has been trying many different OTC meds.  Last BM 2 days ago after enema... large liquidy Bly notes when he gets up to move around.  2-3/20 on pain scale Able to sleep at night. No blood in stool, no fever.  He is taking metamucil, dulcolax. Minimal water.     BP Readings from Last 3 Encounters:  12/17/20 116/68  11/11/20 118/67  10/04/20 138/75   Lab Results  Component Value Date   HGBA1C 6.6 (H) 07/01/2020     Relevant past medical, surgical, family and social history reviewed  and updated as indicated. Interim medical history since our last visit reviewed. Allergies and medications reviewed and updated. Outpatient Medications Prior to Visit  Medication Sig Dispense Refill   acetaminophen (TYLENOL) 500 MG tablet Take 500 mg by mouth every 6 (six) hours as needed.     aspirin EC 81 MG tablet Take 81 mg by mouth daily.     dorzolamide-timolol (COSOPT) 22.3-6.8 MG/ML ophthalmic solution Place 1 drop into both eyes 2 (two) times daily.      fenofibrate 160 MG tablet TAKE 1 TABLET BY MOUTH EVERY DAY 90 tablet 3   ferrous sulfate 325 (65 FE) MG tablet Take 325 mg by mouth daily with breakfast.     hydrochlorothiazide (HYDRODIURIL) 25 MG tablet TAKE 1 TABLET BY MOUTH EVERY DAY 90 tablet 3   latanoprost (XALATAN) 0.005 % ophthalmic solution Place 1 drop into both eyes at bedtime.      metFORMIN (GLUCOPHAGE) 500 MG tablet TAKE ONE TABLET BY MOUTH TWICE DAILY WITH A MEAL 180 tablet 0   nitroGLYCERIN (NITROSTAT) 0.4 MG SL tablet Place 1 tablet (0.4 mg total) under the tongue every 5 (five) minutes as needed for chest pain. 25 tablet prn   rosuvastatin (CRESTOR) 20 MG tablet TAKE 1 TABLET BY MOUTH EVERY DAY 90 tablet 1   No facility-administered medications prior  to visit.     Per HPI unless specifically indicated in ROS section below Review of Systems  Constitutional:  Positive for activity change, appetite change, fatigue and unexpected weight change. Negative for fever.  HENT:  Negative for ear pain.   Eyes:  Negative for pain.  Respiratory:  Negative for cough and shortness of breath.   Cardiovascular:  Negative for chest pain, palpitations and leg swelling.  Gastrointestinal:  Positive for abdominal pain and constipation. Negative for blood in stool.  Genitourinary:  Negative for dysuria.  Musculoskeletal:  Negative for arthralgias.  Neurological:  Negative for syncope, light-headedness and headaches.  Psychiatric/Behavioral:  Negative for dysphoric mood.    Objective:  BP 116/68   Pulse 74   Temp (!) 97.1 F (36.2 C) (Temporal)   Ht 5' 9.5" (1.765 m)   Wt 189 lb (85.7 kg)   SpO2 97%   BMI 27.51 kg/m   Wt Readings from Last 3 Encounters:  12/17/20 189 lb (85.7 kg)  11/23/20 195 lb 4 oz (88.6 kg)  11/17/20 196 lb 6 oz (89.1 kg)      Physical Exam Constitutional:      Appearance: He is well-developed.  HENT:     Head: Normocephalic.     Right Ear: Hearing normal.     Left Ear: Hearing normal.     Nose: Nose normal.  Neck:     Thyroid: No thyroid mass or thyromegaly.     Vascular: No carotid bruit.     Trachea: Trachea normal.  Cardiovascular:     Rate and Rhythm: Normal rate and regular rhythm.     Pulses: Normal pulses.     Heart sounds: Heart sounds not distant. No murmur heard.   No friction rub. No gallop.     Comments: No peripheral edema Pulmonary:     Effort: Pulmonary effort is normal. No respiratory distress.     Breath sounds: Normal breath sounds.  Abdominal:     Tenderness: There is generalized abdominal tenderness. There is no right CVA tenderness, left CVA tenderness, guarding or rebound. Negative signs include Murphy's sign and McBurney's sign.  Skin:    General: Skin is warm and dry.     Findings: No rash.  Psychiatric:        Speech: Speech normal.        Behavior: Behavior normal.        Thought Content: Thought content normal.      Results for orders placed or performed in visit on 11/19/20  CBC  Result Value Ref Range   WBC 7.4 4.0 - 10.5 K/uL   RBC 4.94 4.22 - 5.81 MIL/uL   Hemoglobin 14.9 13.0 - 17.0 g/dL   HCT 44.9 39.0 - 52.0 %   MCV 90.9 80.0 - 100.0 fL   MCH 30.2 26.0 - 34.0 pg   MCHC 33.2 30.0 - 36.0 g/dL   RDW 12.9 11.5 - 15.5 %   Platelets 270 150 - 400 K/uL   nRBC 0.0 0.0 - 0.2 %    This visit occurred during the SARS-CoV-2 public health emergency.  Safety protocols were in place, including screening questions prior to the visit, additional usage of staff PPE, and extensive  cleaning of exam room while observing appropriate contact time as indicated for disinfecting solutions.   COVID 19 screen:  No recent travel or known exposure to COVID19 The patient denies respiratory symptoms of COVID 19 at this time. The importance of social distancing was discussed today.  Assessment and Plan    Problem List Items Addressed This Visit     Cancer of parotid gland ([place])    Followed by  Oncology. Last note from 11/11/2020 reviewed. S/P surgical resection,  And adjuvant radiation.  Has had resulting issues with difficulty chewing, eating and poor appetite which has resulted in weight loss.      Constipation    Chronic with acute worsening, poor control   Increase water.  Continue metamucil fiber.  Stop dulcolax.  Start miralax once to twice daily.      Decreased appetite    Trial of remeron to stimulate appetite.      LLQ pain - Primary    Acute, poor control. Likely due to poor control of constipation.        Eliezer Lofts, MD

## 2020-12-22 DIAGNOSIS — C089 Malignant neoplasm of major salivary gland, unspecified: Secondary | ICD-10-CM | POA: Diagnosis not present

## 2020-12-22 DIAGNOSIS — G510 Bell's palsy: Secondary | ICD-10-CM | POA: Diagnosis not present

## 2020-12-22 DIAGNOSIS — H0223B Paralytic lagophthalmos left eye, upper and lower eyelids: Secondary | ICD-10-CM | POA: Diagnosis not present

## 2020-12-22 DIAGNOSIS — H02402 Unspecified ptosis of left eyelid: Secondary | ICD-10-CM | POA: Diagnosis not present

## 2020-12-28 ENCOUNTER — Other Ambulatory Visit: Payer: Self-pay | Admitting: Family Medicine

## 2020-12-29 ENCOUNTER — Other Ambulatory Visit: Payer: Self-pay | Admitting: *Deleted

## 2020-12-29 ENCOUNTER — Encounter: Payer: Self-pay | Admitting: Radiation Oncology

## 2020-12-29 ENCOUNTER — Inpatient Hospital Stay: Payer: PPO | Attending: Internal Medicine

## 2020-12-29 ENCOUNTER — Ambulatory Visit
Admission: RE | Admit: 2020-12-29 | Discharge: 2020-12-29 | Disposition: A | Discharge status: Home / Self Care | Payer: PPO | Source: Ambulatory Visit | Attending: Radiation Oncology | Admitting: Radiation Oncology

## 2020-12-29 VITALS — BP 122/76 | HR 55 | Temp 96.8°F | Wt 194.6 lb

## 2020-12-29 DIAGNOSIS — Z923 Personal history of irradiation: Secondary | ICD-10-CM | POA: Diagnosis not present

## 2020-12-29 DIAGNOSIS — C07 Malignant neoplasm of parotid gland: Secondary | ICD-10-CM | POA: Diagnosis not present

## 2020-12-29 NOTE — Progress Notes (Signed)
Radiation Oncology Follow up Note  Name: Miguel Bradley   Date:   12/29/2020 MRN:  [id number] DOB: November 16, 1940    This 80 y.o. male presents to the clinic today for 1 month follow-up status post radiation therapy to his left parotid bed and left neck for locally advanced salivary duct carcinoma of the parotid status post left t parotid resection as well as lymph node dissection with multiple positive lymph nodes.  REFERRING PROVIDER: Jinny Sanders, MD  HPI: Patient is a 80 year old male now out 1 month having completed adjuvant external beam radiation therapy to his head and neck region for parotid resection right radical neck dissection with fascia lata suspension and ALT flap reconstruction with sacrifice of the facial nerve for salivary gland malignancy.  He is seen today in routine follow-up is doing well specifically denies head and neck pain or dysphagia.  He has had eyelid weight and tarsal strap on 322.  He is also scheduled for left facial drop surgery in the near future.  He is not had any imaging studies at this time..  COMPLICATIONS OF TREATMENT: none  FOLLOW UP COMPLIANCE: keeps appointments   PHYSICAL EXAM:  BP 122/76   Pulse (!) 55   Temp (!) 96.8 F (36 C)   Wt 194 lb 9.6 oz (88.3 kg)   BMI 28.33 kg/m  Patient does have some edema in the anterior chin.  He also has significant scar tissue in his left neck.  No evidence of discernible adenopathy or any evidence of mass or nodularity left parotid bed is noted.  Well-developed well-nourished patient in NAD. HEENT reveals PERLA, EOMI, discs not visualized.  Oral cavity is clear. No oral mucosal lesions are identified. Neck is clear without evidence of cervical or supraclavicular adenopathy. Lungs are clear to A&P. Cardiac examination is essentially unremarkable with regular rate and rhythm without murmur rub or thrill. Abdomen is benign with no organomegaly or masses noted. Motor sensory and DTR levels are equal and symmetric in  the upper and lower extremities. Cranial nerves II through XII are grossly intact. Proprioception is intact. No peripheral adenopathy or edema is identified. No motor or sensory levels are noted. Crude visual fields are within normal range.  RADIOLOGY RESULTS: PET CT scan is ordered  PLAN: Present time patient is doing clinically well with no significant side effects or complaints.  I have ordered in the next 3 to 4 months a PET CT scan as well as a follow-up visit with me shortly thereafter.  I have asked him to set up follow-up care with ENT in [place].  He continues follow-up care at [facility] in the ENT surgical department.  Patient is to call with any concerns.  I would like to take this opportunity to thank you for allowing me to participate in the care of your patient.Noreene Filbert, MD

## 2020-12-29 NOTE — Progress Notes (Signed)
Nutrition Follow-up:    Patient with left parotid gland, stage IV cancer.  Patient has completed radiation.    Met with patient following radiation follow-up.  Patient reports that his appetite has improved.  His PCP started him on remeron and that has helped.  Reports that he is eating all kinds of foods.  "I eat whatever I want."  Reports that taste has improved some but not 100%.  Yesterday ate spam sandwich with mustard, coffee and juice for breakfast. Lunch was hot ham and cheese sandwich and fries with pepsi.  Supper snacked on chips because had a late lunch.      Medications: reviewed  Labs: reviewed  Anthropometrics:   Weight increased to 194 lb 6 oz today  188 lb on home scale 6/28 195 lb on 6/14  Patient wants to stay around 190-195 lb   NUTRITION DIAGNOSIS: Inadequate oral intake improved   INTERVENTION:  Encouraged well balanced diet including good sources of protein. Contact information given    NEXT VISIT: no follow-up RD available as needed  [name], [place], [place] Registered Dietitian [phone] (mobile)

## 2020-12-31 ENCOUNTER — Other Ambulatory Visit (INDEPENDENT_AMBULATORY_CARE_PROVIDER_SITE_OTHER): Payer: PPO

## 2020-12-31 ENCOUNTER — Other Ambulatory Visit: Payer: Self-pay

## 2020-12-31 ENCOUNTER — Telehealth: Payer: Self-pay | Admitting: Family Medicine

## 2020-12-31 DIAGNOSIS — E11319 Type 2 diabetes mellitus with unspecified diabetic retinopathy without macular edema: Secondary | ICD-10-CM | POA: Diagnosis not present

## 2020-12-31 LAB — COMPREHENSIVE METABOLIC PANEL
ALT: 12 U/L (ref 0–53)
AST: 16 U/L (ref 0–37)
Albumin: 3.7 g/dL (ref 3.5–5.2)
Alkaline Phosphatase: 64 U/L (ref 39–117)
BUN: 15 mg/dL (ref 6–23)
CO2: 26 mEq/L (ref 19–32)
Calcium: 9 mg/dL (ref 8.4–10.5)
Chloride: 105 mEq/L (ref 96–112)
Creatinine, Ser: 1.12 mg/dL (ref 0.40–1.50)
GFR: 62.51 mL/min (ref >60.00)
Glucose, Bld: 130 mg/dL — ABNORMAL HIGH (ref 70–99)
Potassium: 4.1 mEq/L (ref 3.5–5.1)
Sodium: 139 mEq/L (ref 135–145)
Total Bilirubin: 0.4 mg/dL (ref 0.2–1.2)
Total Protein: 6.4 g/dL (ref 6.0–8.3)

## 2020-12-31 LAB — LIPID PANEL
Cholesterol: 131 mg/dL (ref 0–200)
HDL: 37.1 mg/dL — ABNORMAL LOW (ref >39.00)
NonHDL: 94.04
Total CHOL/HDL Ratio: 4
Triglycerides: 214 mg/dL — ABNORMAL HIGH (ref 0.0–149.0)
VLDL: 42.8 mg/dL — ABNORMAL HIGH (ref 0.0–40.0)

## 2020-12-31 LAB — HEMOGLOBIN A1C: Hgb A1c MFr Bld: 6.9 % — ABNORMAL HIGH (ref 4.6–6.5)

## 2020-12-31 LAB — LDL CHOLESTEROL, DIRECT: Direct LDL: 70 mg/dL

## 2020-12-31 NOTE — Telephone Encounter (Signed)
-----   Message from Ellamae Sia sent at 12/31/2020  7:54 AM EDT ----- Regarding: lab orders for now Labs for f/u

## 2020-12-31 NOTE — Progress Notes (Signed)
No critical labs need to be addressed urgently. We will discuss labs in detail at upcoming office visit.   

## 2021-01-03 LAB — HEPATITIS C ANTIBODY
Hepatitis C Ab: NONREACTIVE
SIGNAL TO CUT-OFF: 0.02 (ref <1.00)

## 2021-01-04 NOTE — Progress Notes (Signed)
No critical labs need to be addressed urgently. We will discuss labs in detail at upcoming office visit.   

## 2021-01-07 ENCOUNTER — Encounter: Payer: Self-pay | Admitting: Family Medicine

## 2021-01-07 ENCOUNTER — Other Ambulatory Visit: Payer: Self-pay

## 2021-01-07 ENCOUNTER — Ambulatory Visit (INDEPENDENT_AMBULATORY_CARE_PROVIDER_SITE_OTHER): Payer: PPO | Admitting: Family Medicine

## 2021-01-07 VITALS — BP 110/62 | HR 54 | Temp 97.6°F | Ht 69.5 in | Wt 195.8 lb

## 2021-01-07 DIAGNOSIS — E11319 Type 2 diabetes mellitus with unspecified diabetic retinopathy without macular edema: Secondary | ICD-10-CM

## 2021-01-07 DIAGNOSIS — E1169 Type 2 diabetes mellitus with other specified complication: Secondary | ICD-10-CM

## 2021-01-07 DIAGNOSIS — E785 Hyperlipidemia, unspecified: Secondary | ICD-10-CM | POA: Diagnosis not present

## 2021-01-07 DIAGNOSIS — R630 Anorexia: Secondary | ICD-10-CM | POA: Diagnosis not present

## 2021-01-07 DIAGNOSIS — I152 Hypertension secondary to endocrine disorders: Secondary | ICD-10-CM | POA: Diagnosis not present

## 2021-01-07 DIAGNOSIS — E1159 Type 2 diabetes mellitus with other circulatory complications: Secondary | ICD-10-CM | POA: Diagnosis not present

## 2021-01-07 LAB — MICROALBUMIN / CREATININE URINE RATIO
Creatinine,U: 162.1 mg/dL
Microalb Creat Ratio: 0.8 mg/g (ref 0.0–30.0)
Microalb, Ur: 1.3 mg/dL (ref 0.0–1.9)

## 2021-01-07 NOTE — Progress Notes (Signed)
Patient ID: Miguel Bradley, male    DOB: [birth year], 80 y.o.   MRN: [id number]  This visit was conducted in person.  BP 110/62   Pulse (!) 54   Temp 97.6 F (36.4 C) (Temporal)   Ht 5' 9.5" (1.765 m)   Wt 195 lb 12 oz (88.8 kg)   SpO2 97%   BMI 28.49 kg/m    CC: Chief Complaint  Patient presents with   Diabetes    Subjective:   HPI: Miguel Bradley is a 80 y.o. male presenting on 01/07/2021 for Diabetes  Diabetes:   tolerable control on metfomrin BID Lab Results  Component Value Date   HGBA1C 6.9 (H) 12/31/2020  Using medications without difficulties: Hypoglycemic episodes: Hyperglycemic episodes: Feet problems: Blood Sugars averaging: eye exam within last year:  Decreased appetite and weight loss associated  with parotid cancer and resulting surgical/ radiation treatment/facial paralysis.  Now on remeron.Marland Kitchen appetite improving .  He denies any SE. Wt Readings from Last 3 Encounters:  01/07/21 195 lb 12 oz (88.8 kg)  12/29/20 194 lb 9.6 oz (88.3 kg)  12/17/20 189 lb (85.7 kg)   Elevated Cholesterol:  LDL  < 70 at goal on crestor Lab Results  Component Value Date   CHOL 131 12/31/2020   HDL 37.10 (L) 12/31/2020   LDLCALC 54 11/28/2013   LDLDIRECT 70.0 12/31/2020   TRIG 214.0 (H) 12/31/2020   CHOLHDL 4 12/31/2020  Using medications without problems:none Muscle aches:  none Diet compliance: he has been eating more calories lately to gain weight back after cancer. Exercise: Other complaints:  Hypertension:   HCTZ 25 mg daily   BP Readings from Last 3 Encounters:  01/07/21 110/62  12/29/20 122/76  12/17/20 116/68  Using medication without problems or lightheadedness:  none Chest pain with exertion: none Edema:none Short of breath: none Average home BPs: at goal Other issues:  Has upcoming surgery on 01/18/2021 with Dr. Zigmund Daniel to fix lip so he can eat better.      Relevant past medical, surgical, family and social history reviewed and updated as  indicated. Interim medical history since our last visit reviewed. Allergies and medications reviewed and updated. Outpatient Medications Prior to Visit  Medication Sig Dispense Refill   aspirin EC 81 MG tablet Take 81 mg by mouth daily.     dorzolamide-timolol (COSOPT) 22.3-6.8 MG/ML ophthalmic solution Place 1 drop into both eyes 2 (two) times daily.      fenofibrate 160 MG tablet TAKE 1 TABLET BY MOUTH EVERY DAY 90 tablet 3   ferrous sulfate 325 (65 FE) MG tablet Take 325 mg by mouth daily with breakfast.     hydrochlorothiazide (HYDRODIURIL) 25 MG tablet TAKE 1 TABLET BY MOUTH EVERY DAY 90 tablet 3   latanoprost (XALATAN) 0.005 % ophthalmic solution Place 1 drop into both eyes at bedtime.      metFORMIN (GLUCOPHAGE) 500 MG tablet TAKE ONE TABLET BY MOUTH TWICE DAILY WITH A MEAL 180 tablet 0   mirtazapine (REMERON) 15 MG tablet Take 1 tablet (15 mg total) by mouth at bedtime. 30 tablet 11   nitroGLYCERIN (NITROSTAT) 0.4 MG SL tablet Place 1 tablet (0.4 mg total) under the tongue every 5 (five) minutes as needed for chest pain. 25 tablet prn   Omega-3 Fatty Acids (FISH OIL) 1000 MG CAPS Take 1 capsule by mouth daily.     rosuvastatin (CRESTOR) 20 MG tablet TAKE 1 TABLET BY MOUTH EVERY DAY 90 tablet 1   No  facility-administered medications prior to visit.     Per HPI unless specifically indicated in ROS section below Review of Systems  Constitutional:  Negative for fatigue and fever.  HENT:  Negative for ear pain.   Eyes:  Negative for pain.  Respiratory:  Negative for cough and shortness of breath.   Cardiovascular:  Negative for chest pain, palpitations and leg swelling.  Gastrointestinal:  Negative for abdominal pain.  Genitourinary:  Negative for dysuria.  Musculoskeletal:  Negative for arthralgias.  Neurological:  Negative for syncope, light-headedness and headaches.  Psychiatric/Behavioral:  Negative for dysphoric mood.   Objective:  BP 110/62   Pulse (!) 54   Temp 97.6 F  (36.4 C) (Temporal)   Ht 5' 9.5" (1.765 m)   Wt 195 lb 12 oz (88.8 kg)   SpO2 97%   BMI 28.49 kg/m   Wt Readings from Last 3 Encounters:  01/07/21 195 lb 12 oz (88.8 kg)  12/29/20 194 lb 9.6 oz (88.3 kg)  12/17/20 189 lb (85.7 kg)      Physical Exam Constitutional:      Appearance: He is well-developed.  HENT:     Head: Normocephalic.     Right Ear: Hearing normal.     Left Ear: Hearing normal.     Nose: Nose normal.  Neck:     Thyroid: No thyroid mass or thyromegaly.     Vascular: No carotid bruit.     Trachea: Trachea normal.  Cardiovascular:     Rate and Rhythm: Normal rate and regular rhythm.     Pulses: Normal pulses.     Heart sounds: Heart sounds not distant. No murmur heard.   No friction rub. No gallop.     Comments: No peripheral edema Pulmonary:     Effort: Pulmonary effort is normal. No respiratory distress.     Breath sounds: Normal breath sounds.  Skin:    General: Skin is warm and dry.     Findings: No rash.  Psychiatric:        Speech: Speech normal.        Behavior: Behavior normal.        Thought Content: Thought content normal.      Results for orders placed or performed in visit on 12/31/20  Hepatitis C antibody  Result Value Ref Range   Hepatitis C Ab NON-REACTIVE NON-REACTIVE   SIGNAL TO CUT-OFF 0.02 <1.00  Comprehensive metabolic panel  Result Value Ref Range   Sodium 139 135 - 145 mEq/L   Potassium 4.1 3.5 - 5.1 mEq/L   Chloride 105 96 - 112 mEq/L   CO2 26 19 - 32 mEq/L   Glucose, Bld 130 (H) 70 - 99 mg/dL   BUN 15 6 - 23 mg/dL   Creatinine, Ser 1.12 0.40 - 1.50 mg/dL   Total Bilirubin 0.4 0.2 - 1.2 mg/dL   Alkaline Phosphatase 64 39 - 117 U/L   AST 16 0 - 37 U/L   ALT 12 0 - 53 U/L   Total Protein 6.4 6.0 - 8.3 g/dL   Albumin 3.7 3.5 - 5.2 g/dL   GFR 62.51 >60.00 mL/min   Calcium 9.0 8.4 - 10.5 mg/dL  Lipid panel  Result Value Ref Range   Cholesterol 131 0 - 200 mg/dL   Triglycerides 214.0 (H) 0.0 - 149.0 mg/dL   HDL 37.10  (L) >39.00 mg/dL   VLDL 42.8 (H) 0.0 - 40.0 mg/dL   Total CHOL/HDL Ratio 4    NonHDL 94.04   Hemoglobin  A1c  Result Value Ref Range   Hgb A1c MFr Bld 6.9 (H) 4.6 - 6.5 %  LDL cholesterol, direct  Result Value Ref Range   Direct LDL 70.0 mg/dL    This visit occurred during the SARS-CoV-2 public health emergency.  Safety protocols were in place, including screening questions prior to the visit, additional usage of staff PPE, and extensive cleaning of exam room while observing appropriate contact time as indicated for disinfecting solutions.   COVID 19 screen:  No recent travel or known exposure to COVID19 The patient denies respiratory symptoms of COVID 19 at this time. The importance of social distancing was discussed today.   Assessment and Plan Problem List Items Addressed This Visit     Diabetes mellitus with ophthalmic complication ([place]) - Primary (Chronic)    Tolerable control on metfomrin 500 mg daily.   Get back to low carb diet.       Decreased appetite    Improved control on remeron 15 mg daily.       Hyperlipidemia associated with type 2 diabetes mellitus (HCC)    Stable, chronic.  Continue current medication.    crestor 20 mg daily       Hypertension associated with diabetes (HCC)    Stable, chronic.  Continue current medication.   HCTZ 265 mg daily        Check Micro albumin today    Eliezer Lofts, MD

## 2021-01-07 NOTE — Assessment & Plan Note (Addendum)
Stable, chronic.  Continue current medication.   HCTZ 265 mg daily

## 2021-01-07 NOTE — Assessment & Plan Note (Signed)
Improved control on remeron 15 mg daily.

## 2021-01-07 NOTE — Assessment & Plan Note (Signed)
Tolerable control on metfomrin 500 mg daily.   Get back to low carb diet.

## 2021-01-07 NOTE — Assessment & Plan Note (Addendum)
Stable, chronic.  Continue current medication.   crestor 20 mg daily 

## 2021-01-07 NOTE — Addendum Note (Signed)
Addended by: Carter Kitten on: 01/07/2021 02:01 PM   Modules accepted: Orders

## 2021-01-07 NOTE — Patient Instructions (Addendum)
Work on focusing on protein continue foods less than carbs.  Keep up the regular eating.  Continue remoeron for appetite.

## 2021-01-12 ENCOUNTER — Encounter: Payer: Self-pay | Admitting: Internal Medicine

## 2021-01-12 ENCOUNTER — Inpatient Hospital Stay (HOSPITAL_BASED_OUTPATIENT_CLINIC_OR_DEPARTMENT_OTHER): Payer: PPO | Admitting: Internal Medicine

## 2021-01-12 ENCOUNTER — Inpatient Hospital Stay: Payer: PPO | Attending: Internal Medicine

## 2021-01-12 DIAGNOSIS — Z7982 Long term (current) use of aspirin: Secondary | ICD-10-CM | POA: Diagnosis not present

## 2021-01-12 DIAGNOSIS — N183 Chronic kidney disease, stage 3 unspecified: Secondary | ICD-10-CM | POA: Diagnosis not present

## 2021-01-12 DIAGNOSIS — E291 Testicular hypofunction: Secondary | ICD-10-CM | POA: Insufficient documentation

## 2021-01-12 DIAGNOSIS — E1122 Type 2 diabetes mellitus with diabetic chronic kidney disease: Secondary | ICD-10-CM | POA: Insufficient documentation

## 2021-01-12 DIAGNOSIS — C770 Secondary and unspecified malignant neoplasm of lymph nodes of head, face and neck: Secondary | ICD-10-CM | POA: Diagnosis not present

## 2021-01-12 DIAGNOSIS — Z87891 Personal history of nicotine dependence: Secondary | ICD-10-CM | POA: Diagnosis not present

## 2021-01-12 DIAGNOSIS — C07 Malignant neoplasm of parotid gland: Secondary | ICD-10-CM

## 2021-01-12 DIAGNOSIS — Z7984 Long term (current) use of oral hypoglycemic drugs: Secondary | ICD-10-CM | POA: Insufficient documentation

## 2021-01-12 DIAGNOSIS — Z79899 Other long term (current) drug therapy: Secondary | ICD-10-CM | POA: Diagnosis not present

## 2021-01-12 DIAGNOSIS — I129 Hypertensive chronic kidney disease with stage 1 through stage 4 chronic kidney disease, or unspecified chronic kidney disease: Secondary | ICD-10-CM | POA: Diagnosis not present

## 2021-01-12 LAB — CBC WITH DIFFERENTIAL/PLATELET
Abs Immature Granulocytes: 0.01 10*3/uL (ref 0.00–0.07)
Basophils Absolute: 0.1 10*3/uL (ref 0.0–0.1)
Basophils Relative: 1 %
Eosinophils Absolute: 0.3 10*3/uL (ref 0.0–0.5)
Eosinophils Relative: 5 %
HCT: 47.9 % (ref 39.0–52.0)
Hemoglobin: 15.3 g/dL (ref 13.0–17.0)
Immature Granulocytes: 0 %
Lymphocytes Relative: 17 %
Lymphs Abs: 1 10*3/uL (ref 0.7–4.0)
MCH: 29.7 pg (ref 26.0–34.0)
MCHC: 31.9 g/dL (ref 30.0–36.0)
MCV: 92.8 fL (ref 80.0–100.0)
Monocytes Absolute: 0.4 10*3/uL (ref 0.1–1.0)
Monocytes Relative: 7 %
Neutro Abs: 4 10*3/uL (ref 1.7–7.7)
Neutrophils Relative %: 70 %
Platelets: 204 10*3/uL (ref 150–400)
RBC: 5.16 MIL/uL (ref 4.22–5.81)
RDW: 15 % (ref 11.5–15.5)
WBC: 5.8 10*3/uL (ref 4.0–10.5)
nRBC: 0 % (ref 0.0–0.2)

## 2021-01-12 LAB — COMPREHENSIVE METABOLIC PANEL
ALT: 14 U/L (ref 0–44)
AST: 22 U/L (ref 15–41)
Albumin: 3.9 g/dL (ref 3.5–5.0)
Alkaline Phosphatase: 55 U/L (ref 38–126)
Anion gap: 7 (ref 5–15)
BUN: 18 mg/dL (ref 8–23)
CO2: 30 mmol/L (ref 22–32)
Calcium: 9 mg/dL (ref 8.9–10.3)
Chloride: 103 mmol/L (ref 98–111)
Creatinine, Ser: 1.18 mg/dL (ref 0.61–1.24)
GFR, Estimated: >60 mL/min (ref >60)
Glucose, Bld: 182 mg/dL — ABNORMAL HIGH (ref 70–99)
Potassium: 4.5 mmol/L (ref 3.5–5.1)
Sodium: 140 mmol/L (ref 135–145)
Total Bilirubin: 0.6 mg/dL (ref 0.3–1.2)
Total Protein: 7.1 g/dL (ref 6.5–8.1)

## 2021-01-12 NOTE — Progress Notes (Signed)
[facility] NOTE  Patient Care Team: Jinny Sanders, MD as PCP - General End, Harrell Gave, MD as PCP - Cardiology (Cardiology) Hayden Pedro, MD as Consulting Physician (Ophthalmology) Lorelee Cover., MD as Referring Physician (Ophthalmology) Oneta Rack, MD as Referring Physician (Dermatology) Ladene Artist, MD as Consulting Physician (Gastroenterology) Rosalene Billings., MD as Referring Physician (Dentistry) Debbora Dus, [facility] as Pharmacist (Pharmacist)  CHIEF COMPLAINTS/PURPOSE OF CONSULTATION: parotid cancer  #  Oncology History Overview Note  Addendum    Upon clinician request, additional immunostains are performed at [facility] on block F2, with the following results in the tumor cells:   HER2 ([place], clone [id number]) Interpretation: Positive IHC Score: 3+ (using the ASCO/CAP breast biomarker scoring guidelines; there are no consensus guideline for scoring HER2 in salivary duct carcinoma)   This slide has also been reviewed by Dr. Shelby Mattocks [facility] breast pathology) who concurs.  Addendum electronically signed by Tomasa Blase, MD on 09/03/2020 at  3:21 PM  Diagnosis    A. "Tissue around stylomastoid foramen"  - Fibrovascular tissue extensively involved by carcinoma   B. "Main trunk facial nerve"  - Nerve and fibrous tissue extensively involved by carcinoma     C. "Distal facial nerve" - Nerve tissue; negative for carcinoma     D. "Proximal mastoid nerve margin" - Nerve tissue; negative for carcinoma   E: Left upper superficial parotid, parotidectomy - Benign parotid parenchyma with focal acute and chronic inflammation - Negative for carcinoma   F: Left inferior parotid with sternocleidomastoid muscle, resection - High-grade salivary gland carcinoma, most consistent with salivary duct carcinoma (see comment and synoptic report for further details) - Cauterized tumor present at inked deep surgical margin within fibrous tissue  adjacent to skeletal muscle - Metastatic carcinoma in 1.1 cm nodal conglomerate and 4 of 4 intact lymph nodes (at least 5/5); positive for extranodal extension    G: Lymph node, left neck level 5, lymphadenectomy - Benign adipose; no lymph nodes or carcinoma identified   H: Lymph node, left neck level 1, lymphadenectomy - Metastatic carcinoma in 1 of 5 lymph nodes (1/5); maximum size of metastasis 2 mm; negative for extranodal extension.   I: Left omohyoid, excision - Benign skeletal muscle; negative for carcinoma   J: Left deep lobe parotid, excision - Benign parotid tissue; negative for carcinoma   K: Parotid tissue, biopsy - Benign parotid tissue - No metastatic carcinoma identified in 1 lymph nodes (0/1)   L: Left tissue over mastoid, biopsy - Benign parotid parenchyma, dense fibrous tissue, and bone; negative for carcinoma   M: "Stylomastoid foramen stitch on proximal nerve", excision - Large nerve, negative for perineural/intraneural carcinoma; stitched margin negative - Attached fibrous tissue and parotid parenchyma positive for carcinoma, focally invading around but not within large nerve segment, with crushed/cauterized tumor present at tissue edges   N: Tissue over styloid process, biopsy - Benign parotid tissue; negative for carcinoma   O: Posterior digastric margin, biopsy - Benign skeletal muscle; negative for carcinoma   P: Left neck dissections level 1 through 5, lymphadenectomy - Metastatic carcinoma in 11 of 26 lymph nodes (11/26); maximum size of metastasis 20 mm; two nodes (16 mm and 14 mm) positive for extranodal extension (ENEma, up to 4 mm beyond capsule)   Diagnosis: 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 -----------------------------------------------------------------   07/13/10: Right parathyroidectomy Pathology: Hypercellular parathyroid gland   -------------------------------------------------------------------------------------   # MARCH 2022- CT chest- [[facility]] Subcentimeter pulmonary nodules measuring up to 0.8 cm, indeterminate. No comparison available. Attention on follow-up is recommended.  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 Stage: pT4aN3b-stage IVb.  HER-2 positive; androgen receptor positive.]  PET scan-negative for distant metastatic disease.   # CAD [card]; DM- 2; CKD-III     Cancer of parotid gland ([place])  09/11/2020 Initial Diagnosis   Cancer of parotid gland ([place])   09/16/2020 Cancer Staging   Staging form: Major Salivary Glands, AJCC 8th Edition - Pathologic: Stage IVB (pT4a, pN3b, cM0) - Signed by Cammie Sickle, MD on 09/16/2020      HISTORY OF PRESENTING ILLNESS:  Miguel Bradley 80 y.o.  male pleasant patient with locally advanced high-grade salivary gland/parotid cancer -T4AN3-stage IVb is currently s/p adjuvant radiation.  Patient was recently evaluated by surgery at [facility].   Patient doing well overall.  Denies any new lumps or bumps.  Appetite is good.  Mild to moderate fatigue.  Not any worse.   Review of Systems  Constitutional:  Positive for malaise/fatigue and weight loss. Negative for chills, diaphoresis and fever.  HENT:  Negative for nosebleeds and sore throat.   Eyes:  Negative for double vision.  Respiratory:  Negative for cough, hemoptysis, sputum production, shortness of breath and wheezing.   Cardiovascular:  Negative  for chest pain, palpitations, orthopnea and leg swelling.  Gastrointestinal:  Negative for abdominal pain, blood in stool, constipation, diarrhea, heartburn, melena, nausea and vomiting.  Genitourinary:  Negative for dysuria, frequency and urgency.  Musculoskeletal:  Positive for joint pain. Negative for back pain.  Skin: Negative.  Negative for itching and rash.  Neurological:  Negative for dizziness, tingling, focal weakness, weakness and headaches.  Endo/Heme/Allergies:  Does not bruise/bleed easily.  Psychiatric/Behavioral:  Negative for depression. The patient is not nervous/anxious and does not have insomnia.     MEDICAL HISTORY:  Past Medical History:  Diagnosis Date  . Anemia   . Basal cell carcinoma of face   . Cataract   . Diabetes mellitus without complication ([place])   . Erectile dysfunction 06/11/2014  . Glaucoma   . Hypercalcemia   . Hyperparathyroidism, unspecified ([place])   . Impotence of organic origin   . Other testicular hypofunction   . Pure hyperglyceridemia   . Special screening for malignant neoplasm of prostate   . Special screening for malignant neoplasms, colon   . Unspecified essential hypertension     SURGICAL HISTORY: Past Surgical History:  Procedure Laterality Date  . COLONOSCOPY  06/13/2018   Dr. Lucio Edward  . LEFT HEART CATH AND CORONARY ANGIOGRAPHY Left 03/16/2020   Procedure: LEFT HEART CATH AND CORONARY ANGIOGRAPHY;  Surgeon: Nelva Bush, MD;  Location: [facility];  Service: Cardiovascular;  Laterality: Left;  . PARATHYROIDECTOMY  2012  . skin cancer removal     Dr. Sharlett Iles  . TRIGGER FINGER RELEASE Left 2008    SOCIAL HISTORY: Social History   Socioeconomic History  . Marital status: Married    Spouse name: Not on file  . Number of children: 1  . Years of  education: Not on file  . Highest education level: Not on file  Occupational History  . Occupation: Water quality scientist at [employer]  . Smoking status:  Former    Years: 25.00    Types: Cigarettes    Quit date: 06/12/1978    Years since quitting: 42.6  . Smokeless tobacco: Never  Vaping Use  . Vaping Use: Never used  Substance and Sexual Activity  . Alcohol use: Yes    Comment: 1 beer every few weeks  . Drug use: No  . Sexual activity: Not Currently  Other Topics Concern  . Not on file  Social History Narrative   No exercise, plays golf. Lives in [place]; self- [place]; daughter. puchasing dept in [place]. Rare alcohol. Quit smoking in 1980.    Social Determinants of Health   Financial Resource Strain: Low Risk   . Difficulty of Paying Living Expenses: Not very hard  Food Insecurity: Not on file  Transportation Needs: Not on file  Physical Activity: Not on file  Stress: Not on file  Social Connections: Not on file  Intimate Partner Violence: Not on file    FAMILY HISTORY: Family History  Problem Relation Age of Onset  . Hypertension Mother   . Dementia Mother   . Diabetes Other        aunt  . Hypertension Brother   . Hypertension Brother   . Hypertension Brother   . Hyperlipidemia Brother   . Hyperlipidemia Brother   . Hyperlipidemia Brother   . Hypertension Sister   . Bladder Cancer Sister   . Hyperlipidemia Sister   . Colon cancer Neg Hx   . Esophageal cancer Neg Hx   . Rectal cancer Neg Hx   . Stomach cancer Neg Hx     ALLERGIES:  has No Known Allergies.  MEDICATIONS:  Current Outpatient Medications  Medication Sig Dispense Refill  . dorzolamide-timolol (COSOPT) 22.3-6.8 MG/ML ophthalmic solution Place 1 drop into both eyes 2 (two) times daily.     . fenofibrate 160 MG tablet TAKE 1 TABLET BY MOUTH EVERY DAY 90 tablet 3  . ferrous sulfate 325 (65 FE) MG tablet Take 325 mg by mouth daily with breakfast.    . hydrochlorothiazide (HYDRODIURIL) 25 MG tablet TAKE 1 TABLET BY MOUTH EVERY DAY 90 tablet 3  . latanoprost (XALATAN) 0.005 % ophthalmic solution Place 1 drop into both eyes at bedtime.     . metFORMIN  (GLUCOPHAGE) 500 MG tablet TAKE ONE TABLET BY MOUTH TWICE DAILY WITH A MEAL 180 tablet 0  . mirtazapine (REMERON) 15 MG tablet Take 1 tablet (15 mg total) by mouth at bedtime. 30 tablet 11  . rosuvastatin (CRESTOR) 20 MG tablet TAKE 1 TABLET BY MOUTH EVERY DAY 90 tablet 1  . aspirin EC 81 MG tablet Take 81 mg by mouth daily. (Patient not taking: Reported on 01/12/2021)    . nitroGLYCERIN (NITROSTAT) 0.4 MG SL tablet Place 1 tablet (0.4 mg total) under the tongue every 5 (five) minutes as needed for chest pain. (Patient not taking: Reported on 01/12/2021) 25 tablet prn  . Omega-3 Fatty Acids (FISH OIL) 1000 MG CAPS Take 1 capsule by mouth daily. (Patient not taking: Reported on 01/12/2021)     No current facility-administered medications for this visit.      Marland Kitchen  PHYSICAL EXAMINATION: ECOG PERFORMANCE STATUS: 0 - Asymptomatic  Vitals:   01/12/21 1052  BP: 130/73  Pulse: 76  Resp: 16  Temp: 98.4 F (36.9 C)  SpO2:  97%   Filed Weights   01/12/21 1052  Weight: 88.7 kg    Physical Exam Constitutional:      Comments: Ambulating dependently.  Accompanied by his daughter.  HENT:     Head: Normocephalic and atraumatic.     Mouth/Throat:     Pharynx: No oropharyngeal exudate.  Eyes:     Pupils: Pupils are equal, round, and reactive to light.  Neck:     Comments: Incision from the left neck dissection; parotidectomy noted.  Well-healing.  No evidence of any infection. Cardiovascular:     Rate and Rhythm: Normal rate and regular rhythm.  Pulmonary:     Effort: No respiratory distress.     Breath sounds: No wheezing.  Abdominal:     General: Bowel sounds are normal. There is no distension.     Palpations: Abdomen is soft. There is no mass.     Tenderness: no abdominal tenderness There is no guarding or rebound.  Musculoskeletal:        General: No tenderness. Normal range of motion.     Cervical back: Normal range of motion and neck supple.  Skin:    General: Skin is warm.   Neurological:     Mental Status: He is alert and oriented to person, place, and time.     Comments: Left facial droop noted-post surgery.  Psychiatric:        Mood and Affect: Affect normal.     LABORATORY DATA:  I have reviewed the data as listed Lab Results  Component Value Date   WBC 5.8 01/12/2021   HGB 15.3 01/12/2021   HCT 47.9 01/12/2021   MCV 92.8 01/12/2021   PLT 204 01/12/2021   Recent Labs    02/23/20 1201 03/10/20 1636 07/01/20 0748 09/13/20 1208 11/11/20 1015 12/31/20 1003 01/12/21 1019  NA 138 139   < > 138 135 139 140  K 4.3 3.8   < > 4.4 3.4* 4.1 4.5  CL 101 104   < > 102 100 105 103  CO2 24 24   < > $R'26 24 26 30  'HF$ GLUCOSE 122* 108*   < > 118* 130* 130* 182*  BUN 17 17   < > $R'18 12 15 18  'Gf$ CREATININE 1.13 1.31*   < > 1.19 0.96 1.12 1.18  CALCIUM 9.8 9.6   < > 8.8* 9.2 9.0 9.0  GFRNONAA 62 52*  --  >60 >60  --  >60  GFRAA 72 >60  --   --   --   --   --   PROT  --   --    < > 7.2 7.1 6.4 7.1  ALBUMIN  --   --    < > 3.6 3.8 3.7 3.9  AST  --   --    < > $R'20 25 16 22  'uu$ ALT  --   --    < > $R'14 18 12 14  'Gj$ ALKPHOS  --   --    < > 69 61 64 55  BILITOT  --   --    < > 0.6 0.8 0.4 0.6   < > = values in this interval not displayed.    RADIOGRAPHIC STUDIES: I have personally reviewed the radiological images as listed and agreed with the findings in the report. No results found.  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 Stage: pT4aN3b-stage IVb.  HER-2 positive; androgen receptor positive.]  PET scan-negative for distant metastatic disease].     #Patient currently s/p adjuvant radiation-[finished July 16th. 2022]. NO clinical evidence of recurrence. Given AR positive disease-recommend  androgen deprivation therapy given the significant risk of recurrence based upon pathology.  Discussed with Dr. Ihor Austin ENT, [facility].   I had a long discussion the importance of starting ADT-to treat his prevent parotid cancer recurrence.  I reviewed the mechanism of action of ADT/blocking testosterone.  Again reviewed the potential side effects including but not limited to-fatigue hot flashes loss of libido.  Also reviewed that bone health/cardiovascular as long-term complications.  I would recommend Eligard.  Understands that goal of treatment is curative-however the chance of cure is unfortunately quite small given his significant bulk of disease.  #Postradiation mucositis-improved; no significant dry mouth.  Stable  # CKD- stage III-  [1 BID; tylenol]-stable.  #Lymphedema neck s/p surgery-would recommend evaluation with [facility] regarding lymphedema treatment/prevention.  # DISPOSITION:  # referral to maureen/OT re: Lymphedema o neck # Follow up TBD-Dr.B  # 40 minutes face-to-face with the patient discussing the above plan of care; more than 50% of time spent on prognosis/ natural history; counseling and coordination.   All questions were answered. The patient knows to call the clinic with any problems, questions or concerns.    Cammie Sickle, MD 01/23/2021 9:32 PM

## 2021-01-12 NOTE — Assessment & Plan Note (Addendum)
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 Stage: pT4aN3b-stage IVb.  HER-2 positive; androgen receptor positive.]  PET scan-negative for distant metastatic disease].     #Patient currently s/p adjuvant radiation-[finished July 16th. 2022]. NO clinical evidence of recurrence. Given AR positive disease-recommend androgen deprivation therapy given the significant risk of recurrence based upon pathology.  Discussed with Dr. Ihor Austin ENT, [facility].   I had a long discussion the importance of starting ADT-to treat his prevent parotid cancer recurrence.  I reviewed the mechanism of action of ADT/blocking testosterone.  Again reviewed the potential side effects including but not limited to-fatigue hot flashes loss of libido.  Also reviewed that bone health/cardiovascular as long-term complications.  I would recommend Eligard.  Understands that goal of treatment is curative-however the chance of cure is unfortunately quite small given his significant bulk of disease.  #Postradiation mucositis-improved; no significant dry mouth.  Stable  # CKD- stage III-  [1 BID; tylenol]-stable.  #Lymphedema neck s/p surgery-would recommend evaluation with [facility] regarding lymphedema treatment/prevention.  # DISPOSITION:  # referral to maureen/OT re: Lymphedema o neck # Follow up TBD-Dr.B  # 40 minutes face-to-face with the patient discussing the above plan of care; more than 50% of time spent on prognosis/ natural history; counseling and coordination.

## 2021-01-12 NOTE — Progress Notes (Signed)
Having facial plastic surgery for mouth on 8/9 with Dr. Sabra Heck at [facility]

## 2021-01-18 DIAGNOSIS — G510 Bell's palsy: Secondary | ICD-10-CM | POA: Diagnosis not present

## 2021-01-18 DIAGNOSIS — K117 Disturbances of salivary secretion: Secondary | ICD-10-CM | POA: Diagnosis not present

## 2021-01-18 DIAGNOSIS — R471 Dysarthria and anarthria: Secondary | ICD-10-CM | POA: Diagnosis not present

## 2021-01-18 DIAGNOSIS — K131 Cheek and lip biting: Secondary | ICD-10-CM | POA: Diagnosis not present

## 2021-01-23 ENCOUNTER — Encounter: Payer: Self-pay | Admitting: Internal Medicine

## 2021-01-26 ENCOUNTER — Encounter: Payer: Self-pay | Admitting: Internal Medicine

## 2021-01-31 ENCOUNTER — Telehealth: Payer: Self-pay | Admitting: Internal Medicine

## 2021-01-31 ENCOUNTER — Telehealth: Payer: Self-pay | Admitting: *Deleted

## 2021-01-31 DIAGNOSIS — C07 Malignant neoplasm of parotid gland: Secondary | ICD-10-CM

## 2021-01-31 NOTE — Telephone Encounter (Signed)
Schedule pt for Eligard this week (insurance now approved medication)  Then schedule pt for lab/md in 3 months

## 2021-01-31 NOTE — Telephone Encounter (Signed)
On 8/22-I spoke to patient regarding insurance approval of Eligard.  C-please schedule Eligard ASAP  Schedule follow-up-MD labs CBC CMP; in 3 months- GB

## 2021-02-01 NOTE — Addendum Note (Signed)
Addended by: Gloris Ham on: 02/01/2021 08:23 AM   Modules accepted: Orders

## 2021-02-03 ENCOUNTER — Other Ambulatory Visit: Payer: Self-pay

## 2021-02-03 ENCOUNTER — Inpatient Hospital Stay: Payer: PPO

## 2021-02-03 DIAGNOSIS — C07 Malignant neoplasm of parotid gland: Secondary | ICD-10-CM

## 2021-02-03 MED ORDER — LEUPROLIDE ACETATE (6 MONTH) 45 MG ~~LOC~~ KIT
45.0000 mg | PACK | Freq: Once | SUBCUTANEOUS | Status: AC
  Administered 2021-02-03: 45 mg via SUBCUTANEOUS
  Filled 2021-02-03: qty 45

## 2021-02-17 DIAGNOSIS — H40153 Residual stage of open-angle glaucoma, bilateral: Secondary | ICD-10-CM | POA: Diagnosis not present

## 2021-02-22 DIAGNOSIS — R1032 Left lower quadrant pain: Secondary | ICD-10-CM | POA: Insufficient documentation

## 2021-02-22 NOTE — Assessment & Plan Note (Signed)
Acute, poor control. Likely due to poor control of constipation.

## 2021-02-22 NOTE — Assessment & Plan Note (Signed)
Trial of remeron to stimulate appetite.

## 2021-02-22 NOTE — Assessment & Plan Note (Addendum)
Chronic with acute worsening, poor control   Increase water.  Continue metamucil fiber.  Stop dulcolax.  Start miralax once to twice daily.

## 2021-02-22 NOTE — Assessment & Plan Note (Signed)
Followed by  Oncology. Last note from 11/11/2020 reviewed. S/P surgical resection,  And adjuvant radiation.  Has had resulting issues with difficulty chewing, eating and poor appetite which has resulted in weight loss.

## 2021-03-24 ENCOUNTER — Ambulatory Visit
Admission: RE | Admit: 2021-03-24 | Discharge: 2021-03-24 | Disposition: A | Discharge status: Home / Self Care | Payer: PPO | Source: Ambulatory Visit | Attending: Radiation Oncology | Admitting: Radiation Oncology

## 2021-03-24 ENCOUNTER — Other Ambulatory Visit: Payer: Self-pay

## 2021-03-24 DIAGNOSIS — C07 Malignant neoplasm of parotid gland: Secondary | ICD-10-CM | POA: Insufficient documentation

## 2021-03-24 DIAGNOSIS — Z79899 Other long term (current) drug therapy: Secondary | ICD-10-CM | POA: Insufficient documentation

## 2021-03-24 DIAGNOSIS — I700 Atherosclerosis of aorta: Secondary | ICD-10-CM | POA: Diagnosis not present

## 2021-03-24 DIAGNOSIS — I2510 Atherosclerotic heart disease of native coronary artery without angina pectoris: Secondary | ICD-10-CM | POA: Insufficient documentation

## 2021-03-24 DIAGNOSIS — N289 Disorder of kidney and ureter, unspecified: Secondary | ICD-10-CM | POA: Diagnosis not present

## 2021-03-24 LAB — GLUCOSE, CAPILLARY: Glucose-Capillary: 115 mg/dL — ABNORMAL HIGH (ref 70–99)

## 2021-03-24 MED ORDER — FLUDEOXYGLUCOSE F - 18 (FDG) INJECTION
10.3000 | Freq: Once | INTRAVENOUS | Status: AC | PRN
  Administered 2021-03-24: 11.3 via INTRAVENOUS

## 2021-03-30 DIAGNOSIS — C089 Malignant neoplasm of major salivary gland, unspecified: Secondary | ICD-10-CM | POA: Diagnosis not present

## 2021-03-30 DIAGNOSIS — G510 Bell's palsy: Secondary | ICD-10-CM | POA: Diagnosis not present

## 2021-03-31 ENCOUNTER — Ambulatory Visit: Payer: PPO | Admitting: Radiation Oncology

## 2021-04-12 ENCOUNTER — Ambulatory Visit: Payer: PPO | Admitting: Family Medicine

## 2021-04-20 ENCOUNTER — Telehealth: Payer: Self-pay

## 2021-04-20 NOTE — Progress Notes (Signed)
    Chronic Care Management Pharmacy Assistant   Name: Miguel Bradley  MRN: [id number] DOB: [birth year]  Reason for Encounter: CCM (Reminder Call)   Medications: Outpatient Encounter Medications as of 04/20/2021  Medication Sig   aspirin EC 81 MG tablet Take 81 mg by mouth daily. (Patient not taking: Reported on 01/12/2021)   dorzolamide-timolol (COSOPT) 22.3-6.8 MG/ML ophthalmic solution Place 1 drop into both eyes 2 (two) times daily.    fenofibrate 160 MG tablet TAKE 1 TABLET BY MOUTH EVERY DAY   ferrous sulfate 325 (65 FE) MG tablet Take 325 mg by mouth daily with breakfast.   hydrochlorothiazide (HYDRODIURIL) 25 MG tablet TAKE 1 TABLET BY MOUTH EVERY DAY   latanoprost (XALATAN) 0.005 % ophthalmic solution Place 1 drop into both eyes at bedtime.    metFORMIN (GLUCOPHAGE) 500 MG tablet TAKE ONE TABLET BY MOUTH TWICE DAILY WITH A MEAL   mirtazapine (REMERON) 15 MG tablet Take 1 tablet (15 mg total) by mouth at bedtime.   nitroGLYCERIN (NITROSTAT) 0.4 MG SL tablet Place 1 tablet (0.4 mg total) under the tongue every 5 (five) minutes as needed for chest pain. (Patient not taking: Reported on 01/12/2021)   Omega-3 Fatty Acids (FISH OIL) 1000 MG CAPS Take 1 capsule by mouth daily. (Patient not taking: Reported on 01/12/2021)   rosuvastatin (CRESTOR) 20 MG tablet TAKE 1 TABLET BY MOUTH EVERY DAY   No facility-administered encounter medications on file as of 04/20/2021.    [name] was contacted to remind him of his upcoming telephone visit with Debbora Dus on 04/26/2021 at 10:00. Patient was reminded to have all medications, supplements and any blood glucose and blood pressure readings available for review at appointment.  Are you having any problems with your medications? No  Do you have any concerns you like to discuss with the pharmacist? No  Star Rating Drugs: Medication:   Last Fill: Day Supply Metformin 500 mg  02/07/2021 90  Rosuvastatin 20 mg  03/23/2021 [place],  CPP notified  Marijean Niemann, [place] [phone]  Time Spent: 10 Minutes

## 2021-04-26 ENCOUNTER — Other Ambulatory Visit: Payer: Self-pay

## 2021-04-26 ENCOUNTER — Ambulatory Visit (INDEPENDENT_AMBULATORY_CARE_PROVIDER_SITE_OTHER): Payer: Self-pay

## 2021-04-26 DIAGNOSIS — E11319 Type 2 diabetes mellitus with unspecified diabetic retinopathy without macular edema: Secondary | ICD-10-CM

## 2021-04-26 DIAGNOSIS — E1159 Type 2 diabetes mellitus with other circulatory complications: Secondary | ICD-10-CM

## 2021-04-26 NOTE — Progress Notes (Signed)
Chronic Care Management Pharmacy Note  04/26/2021 Name:  Miguel Bradley MRN:  [id number] DOB:  [birth year]  Subjective: Miguel Bradley is an 80 y.o. year old male who is a primary patient of Bedsole, Amy E, MD.  The CCM team was consulted for assistance with disease management and care coordination needs.    Engaged with patient by telephone for follow up visit in response to provider referral for pharmacy case management and/or care coordination services.   Consent to Services:  The patient was given information about Chronic Care Management services, agreed to services, and gave verbal consent prior to initiation of services.  Please see initial visit note for detailed documentation.   Patient Care Team: Jinny Sanders, MD as PCP - General End, Harrell Gave, MD as PCP - Cardiology (Cardiology) Hayden Pedro, MD as Consulting Physician (Ophthalmology) Lorelee Cover., MD as Referring Physician (Ophthalmology) Oneta Rack, MD as Referring Physician (Dermatology) Ladene Artist, MD as Consulting Physician (Gastroenterology) Rosalene Billings., MD as Referring Physician (Dentistry) Debbora Dus, [facility] as Pharmacist (Pharmacist) Cammie Sickle, MD as Consulting Physician (Hematology and Oncology)  Recent office visits: 01/07/21 - PCP, Dr Diona Browner - Pt presented for dm follow up. Tolerable control on metformin. Reduce carb intake. Continue Remeron for appetite. 12/17/20 - PCP, Dr. Diona Browner - Pt presented for LLQ pain, constipation and decrease appetite. Increase water, continue fiber, start MiraLAX -1-2 daily. Start Remeron for appetite.  Recent consult visits:  03/30/21 - Otolaryngology - Pt presented for salivary duct carcinoma surveillance. S/p resection and reconstruction as above on 08/24/20. He completed adjuvant RT on 11/24/20. The patient is now on androgen depravation therapy. Continue ADT, follow up 2 months. 01/26/21 - Otolaryngology - Pt presented for salivary duct  carcinoma. 01/12/2021 - Pete Pelt, Oncology - Pt presented for cancer of parotid gland. No evidence of recurrence. S/p radiation December 25, 2020. Recommend Eligard for ADT-prevention of cancer recurrence. 11/11/20 - Pete Pelt, Oncology - Pt present fo cancer of parotid gland. Notable weight loss 15 lb in 1 month. Refer to nutritionist.   Hospital visits: None in past 6 months  Objective:  Lab Results  Component Value Date   CREATININE 1.08 04/27/2021   BUN 19 04/27/2021   GFR 62.51 12/31/2020   GFRNONAA >60 04/27/2021   GFRAA >60 03/10/2020   NA 136 04/27/2021   K 3.6 04/27/2021   CALCIUM 9.0 04/27/2021   CO2 24 04/27/2021   GLUCOSE 135 (H) 04/27/2021    Lab Results  Component Value Date/Time   HGBA1C 6.9 (H) 12/31/2020 10:03 AM   HGBA1C 6.6 (H) 07/01/2020 07:48 AM   GFR 62.51 12/31/2020 10:03 AM   GFR 48.41 (L) 07/01/2020 07:48 AM   MICROALBUR 1.3 01/07/2021 02:06 PM   MICROALBUR 1.2 12/26/2019 08:11 AM    Last diabetic Eye exam:  Lab Results  Component Value Date/Time   HMDIABEYEEXA No Retinopathy 12/10/2018 12:00 AM   HMDIABEYEEXA Retinopathy (A) 12/10/2018 12:00 AM    Last diabetic Foot exam:  Lab Results  Component Value Date/Time   HMDIABFOOTEX done 07/09/2020 12:00 AM     Lab Results  Component Value Date   CHOL 131 12/31/2020   HDL 37.10 (L) 12/31/2020   LDLCALC 54 11/28/2013   LDLDIRECT 70.0 12/31/2020   TRIG 214.0 (H) 12/31/2020   CHOLHDL 4 12/31/2020    Hepatic Function Latest Ref Rng & Units 04/27/2021 01/12/2021 12/31/2020  Total Protein 6.5 - 8.1 g/dL 7.5 7.1 6.4  Albumin 3.5 -  5.0 g/dL 3.9 3.9 3.7  AST 15 - 41 U/L 24 22 16   ALT 0 - 44 U/L 17 14 12   Alk Phosphatase 38 - 126 U/L 60 55 64  Total Bilirubin 0.3 - 1.2 mg/dL 0.2(L) 0.6 0.4  Bilirubin, Direct 0.0 - 0.3 mg/dL - - -    Lab Results  Component Value Date/Time   TSH 0.85 11/16/2009 02:24 PM   TSH 1.17 07/04/2007 09:10 AM    CBC Latest Ref Rng & Units 04/27/2021  01/12/2021 11/19/2020  WBC 4.0 - 10.5 K/uL 6.4 5.8 7.4  Hemoglobin 13.0 - 17.0 g/dL 15.1 15.3 14.9  Hematocrit 39.0 - 52.0 % 45.8 47.9 44.9  Platelets 150 - 400 K/uL 247 204 270    No results found for: VD25OH  Clinical ASCVD: Yes  The 10-year ASCVD risk score (Arnett DK, et al., 2019) is: 54.4%   Values used to calculate the score:     Age: 30 years     Sex: Male     Is Non-Hispanic African American: No     Diabetic: Yes     Tobacco smoker: No     Systolic Blood Pressure: 458 mmHg     Is BP treated: Yes     HDL Cholesterol: 37.1 mg/dL     Total Cholesterol: 131 mg/dL    Depression screen [facility] 2/9 07/09/2020 07/01/2019 06/19/2018  Decreased Interest 0 0 0  Down, Depressed, Hopeless 0 0 0  PHQ - 2 Score 0 0 0  Altered sleeping - - 0  Tired, decreased energy - - 0  Change in appetite - - 0  Feeling bad or failure about yourself  - - 0  Trouble concentrating - - 0  Moving slowly or fidgety/restless - - 0  Suicidal thoughts - - 0  PHQ-9 Score - - 0  Difficult doing work/chores - - Not difficult at all    Social History   Tobacco Use  Smoking Status Former   Years: 25.00   Types: Cigarettes   Quit date: 06/12/1978   Years since quitting: 42.9  Smokeless Tobacco Never   BP Readings from Last 3 Encounters:  04/27/21 124/69  01/12/21 130/73  01/07/21 110/62   Pulse Readings from Last 3 Encounters:  04/27/21 (!) 49  01/12/21 76  01/07/21 (!) 54   Wt Readings from Last 3 Encounters:  04/27/21 202 lb (91.6 kg)  03/24/21 198 lb (89.8 kg)  01/12/21 195 lb 9.6 oz (88.7 kg)   BMI Readings from Last 3 Encounters:  04/27/21 29.40 kg/m  03/24/21 28.82 kg/m  01/12/21 28.47 kg/m    Assessment/Interventions: Review of patient past medical history, allergies, medications, health status, including review of consultants reports, laboratory and other test data, was performed as part of comprehensive evaluation and provision of chronic care management services.   SDOH:  (Social  Determinants of Health) assessments and interventions performed: Yes SDOH Interventions    Flowsheet Row Most Recent Value  SDOH Interventions   Financial Strain Interventions Intervention Not Indicated       SDOH Screenings   Alcohol Screen: Not on file  Depression (PHQ2-9): Low Risk    PHQ-2 Score: 0  Financial Resource Strain: Low Risk    Difficulty of Paying Living Expenses: Not very hard  Food Insecurity: Not on file  Housing: Not on file  Physical Activity: Not on file  Social Connections: Not on file  Stress: Not on file  Tobacco Use: Medium Risk   Smoking Tobacco Use: Former  Smokeless Tobacco Use: Never   Passive Exposure: Not on file  Transportation Needs: Not on file    [place]  No Known Allergies  Medications Reviewed Today     Reviewed by Debbora Dus, [facility] (Pharmacist) on 04/28/21 at 1631  Med List Status: <None>   Medication Order Taking? Sig Documenting Provider Last Dose Status Informant  aspirin EC 81 MG tablet [id number] Yes Take 81 mg by mouth daily. [provider] Taking Active   diphenhydramine-acetaminophen (TYLENOL PM EXTRA STRENGTH) 25-500 MG TABS tablet [id number]  Take 1 tablet by mouth at bedtime as needed. [provider]  Active   dorzolamide-timolol (COSOPT) 22.3-6.8 MG/ML ophthalmic solution [id number]  Place 1 drop into both eyes 2 (two) times daily.  [provider]  Active Spouse/Significant Other           Med Note Minus Breeding [facility], BRANDY L   Mon Feb 23, 2020 10:32 AM)    fenofibrate 160 MG tablet [id number] Yes TAKE 1 TABLET BY MOUTH EVERY DAY Bedsole, Amy E, MD Taking Active   ferrous sulfate 325 (65 FE) MG tablet [id number]  Take 325 mg by mouth daily with breakfast. [provider]  Active   hydrochlorothiazide (HYDRODIURIL) 25 MG tablet [id number] Yes TAKE 1 TABLET BY MOUTH EVERY DAY Bedsole, Amy E, MD Taking Active   latanoprost (XALATAN) 0.005 % ophthalmic solution [id number] Yes Place 1  drop into both eyes at bedtime.  [provider] Taking Active Spouse/Significant Other           Med Note (NEWCOMER Algernon Huxley, BRANDY L   Mon Feb 23, 2020 10:32 AM)    metFORMIN (GLUCOPHAGE) 500 MG tablet [id number] Yes TAKE ONE TABLET BY MOUTH TWICE DAILY WITH A MEAL Bedsole, Amy E, MD Taking Active   nitroGLYCERIN (NITROSTAT) 0.4 MG SL tablet [id number] Yes Place 1 tablet (0.4 mg total) under the tongue every 5 (five) minutes as needed for chest pain. Nelva Bush, MD Taking Expired 04/26/21 2359   Omega-3 Fatty Acids (FISH OIL) 1000 MG CAPS [id number]  Take 1 capsule by mouth daily. [provider]  Active   rosuvastatin (CRESTOR) 20 MG tablet [id number] Yes TAKE 1 TABLET BY MOUTH EVERY DAY Jinny Sanders, MD Taking Active             Patient Active Problem List   Diagnosis Date Noted   LLQ pain 02/22/2021   Decreased appetite 01/07/2021   Cancer of parotid gland ([place]) 09/11/2020   Mass of left side of neck 08/22/2020   Constipation 07/09/2020   Abnormal cardiac CT angiography 03/16/2020   Coronary artery disease involving native coronary artery of native heart without angina pectoris 03/11/2020   Mild aortic stenosis 03/11/2020   Decreased mobility and endurance 12/30/2019   ICAO (internal carotid artery occlusion), left 07/01/2019   Seasonal allergic rhinitis due to pollen 09/24/2018   Diabetic neuropathy ([place]) 06/21/2017   Class 1 obesity with serious comorbidity and body mass index (BMI) of 32.0 to 32.9 in adult 06/21/2017   Diabetic retinopathy associated with type 2 diabetes mellitus ([place]) 03/12/2015   Counseling regarding advanced care planning and goals of care 12/08/2014   Diabetes mellitus with ophthalmic complication ([place]) 20/35/5974   Renal insufficiency 11/25/2010   Testicular hypofunction 11/17/2009   Hyperlipidemia associated with type 2 diabetes mellitus ([place]) 07/05/2007   CARCINOMA, BASAL CELL, FACE 07/03/2007   Hypertension associated with  diabetes ([place]) 07/03/2007    Immunization History  Administered Date(s) Administered   Influenza, High  Dose Seasonal PF 03/26/2017, 02/05/2019, 02/23/2020, 03/09/2021   Influenza,inj,Quad PF,6+ Mos 04/22/2015, 02/28/2016, 03/12/2018   PFIZER(Purple Top)SARS-COV-2 Vaccination 08/08/2019, 09/19/2019, 04/29/2020   Pneumococcal Conjugate-13 12/08/2014   Pneumococcal Polysaccharide-23 11/16/2009   Td 11/16/2009, 12/09/2016   Tdap 06/10/2015    Conditions to be addressed/monitored:  Hypertension, Hyperlipidemia and Diabetes  Care Plan : [place]  Updates made by Debbora Dus, [facility] since 04/28/2021 12:00 AM     Problem: CHL AMB "PATIENT-SPECIFIC PROBLEM"   Note:     Current Barriers:  None identified  Pharmacist Clinical Goal(s):  Patient will contact provider office for questions/concerns as evidenced notation of same in electronic health record through collaboration with PharmD and provider.   Interventions: 1:1 collaboration with Jinny Sanders, MD regarding development and update of comprehensive plan of care as evidenced by provider attestation and co-signature Inter-disciplinary care team collaboration (see longitudinal plan of care) Comprehensive medication review performed; medication list updated in electronic medical record  Hypertension (BP goal <140/90) -Controlled - clinic bp within goal -Current treatment: HCTZ 25 mg - 1 tablet daily -Medications previously tried: none  -Pt has an automatic BP monitor with arm cuff, he states it is difficult to place cuff on arm at times. -Current home readings: (all within past week) 143/80,56; 140/75, 58; 121/66, 54 (today) -Current dietary habits: see diabetes section -Current exercise habits: none formal, golf, tries to stay active -Denies hypotensive/hypertensive symptoms  -Denies any falls, or imbalance -Counseled to monitor BP at home 2-3 weeks, document, and provide log at future appointments -Recommended  to continue current medication; Bring BP monitor to next appointment. F/u 3-4 weeks by CCM team for BP log.   Hyperlipidemia: (LDL goal < 70) -Controlled, LDL 70, condition stable and not discussed 04/28/21 -Current treatment: Rosuvastatin 20 mg - 1 tablet daily Fenofibrate 160 mg - 1 tablet daily Fish oil 1200 mg - 1 capsule daily Aspirin 81 mg - 1 tablet daily (evening) Nitroglycerin 0.4 mg SL - as needed  -Medications previously tried: none reported -Educated on Cholesterol goals;  -Recommended to continue current medication  Diabetes (A1c goal <7%) -Controlled, A1c 6.9 (12/2020), A1c has increased from 6.6 to 6.9 but still within goal. Pt keps a record of home BG log. Most readings within goal for fasting (80-130). He reports is appetite has returned to normal and no longer taking mirtazapine. He tapered himself off recently. His weight was down to 189 with radiation. He would like to keep weight under 200 lbs. -Current medications: Metformin 500 mg - 1 tablet twice daily with meals  -Medications previously tried: none  -Current home glucose readings - checks every other day before breakfast fasting glucose: 126 this morning, 123, 131, 135, 137, 120, 117, 114 post prandial glucose: none -Denies hypoglycemic/hyperglycemic symptoms -Denies any BG < 70 -Current meal patterns:  Meat, potatoes, green beans; limited vegetables Watches what he eats, limits red meats, watches calories, eats more chicken than beef -Educated on A1c and blood sugar goals; We discussed limited portions of carbohydrates.  -Counseled to check feet daily and get yearly eye exams ([facility]/Dr. Zigmund Daniel) -  these are up to date per patient report -Recommended to continue current medication; Continue to monitor home blood glucose levels.   Patient Goals/Self-Care Activities Patient will:  - take medications as prescribed - keep record of home BP readings   Follow Up Plan: Telephone follow up appointment  with care management team member scheduled for:  -CCP 6 months  -CCM Team blood pressure  log review in 3-4 weeks    Medication Assistance: None required.  Patient affirms current coverage meets needs.  Star Rating Drugs:  Medication:                            Last Fill:         Day Supply Metformin 500 mg                 02/07/2021      90         Rosuvastatin 20 mg              03/23/2021      90  Patient's preferred pharmacy is: [facility] [place], [place] [address] [place] [address] Phone: [phone] Fax: [phone]  [id number] [place] [place] [address] [id number]: [phone]: [phone] Uses pill box? Yes - weekly pillbox, has one box for daytime and one for after dinner  Pt endorses good compliance  Pt gets rosuvastatin, Remeron, nitroglycerin [facility]  We discussed: Benefits of medication synchronization, packaging and delivery as well as enhanced pharmacist oversight with [employer]. Patient decided to: Continue current medication management strategy -Pt will keep this option in mind but states he is doing well with current pharmacy at this time.  Care Plan and Follow Up Patient Decision:  Patient agrees to Care Plan and Follow-up.  MDebbora Dus PharmD Clinical Pharmacist [phone]

## 2021-04-27 ENCOUNTER — Inpatient Hospital Stay: Payer: PPO | Attending: Internal Medicine

## 2021-04-27 ENCOUNTER — Encounter: Payer: Self-pay | Admitting: Internal Medicine

## 2021-04-27 ENCOUNTER — Inpatient Hospital Stay: Payer: PPO | Admitting: Internal Medicine

## 2021-04-27 ENCOUNTER — Other Ambulatory Visit: Payer: Self-pay

## 2021-04-27 VITALS — BP 124/69 | HR 49 | Temp 96.9°F | Resp 16 | Wt 202.0 lb

## 2021-04-27 DIAGNOSIS — Z87891 Personal history of nicotine dependence: Secondary | ICD-10-CM | POA: Insufficient documentation

## 2021-04-27 DIAGNOSIS — Z9221 Personal history of antineoplastic chemotherapy: Secondary | ICD-10-CM | POA: Insufficient documentation

## 2021-04-27 DIAGNOSIS — Z923 Personal history of irradiation: Secondary | ICD-10-CM | POA: Insufficient documentation

## 2021-04-27 DIAGNOSIS — C07 Malignant neoplasm of parotid gland: Secondary | ICD-10-CM | POA: Insufficient documentation

## 2021-04-27 DIAGNOSIS — Z7984 Long term (current) use of oral hypoglycemic drugs: Secondary | ICD-10-CM | POA: Insufficient documentation

## 2021-04-27 DIAGNOSIS — N183 Chronic kidney disease, stage 3 unspecified: Secondary | ICD-10-CM | POA: Insufficient documentation

## 2021-04-27 DIAGNOSIS — Z7982 Long term (current) use of aspirin: Secondary | ICD-10-CM | POA: Insufficient documentation

## 2021-04-27 LAB — CBC WITH DIFFERENTIAL/PLATELET
Abs Immature Granulocytes: 0.01 10*3/uL (ref 0.00–0.07)
Basophils Absolute: 0.1 10*3/uL (ref 0.0–0.1)
Basophils Relative: 1 %
Eosinophils Absolute: 0.2 10*3/uL (ref 0.0–0.5)
Eosinophils Relative: 4 %
HCT: 45.8 % (ref 39.0–52.0)
Hemoglobin: 15.1 g/dL (ref 13.0–17.0)
Immature Granulocytes: 0 %
Lymphocytes Relative: 17 %
Lymphs Abs: 1.1 10*3/uL (ref 0.7–4.0)
MCH: 30.9 pg (ref 26.0–34.0)
MCHC: 33 g/dL (ref 30.0–36.0)
MCV: 93.9 fL (ref 80.0–100.0)
Monocytes Absolute: 0.5 10*3/uL (ref 0.1–1.0)
Monocytes Relative: 8 %
Neutro Abs: 4.5 10*3/uL (ref 1.7–7.7)
Neutrophils Relative %: 70 %
Platelets: 247 10*3/uL (ref 150–400)
RBC: 4.88 MIL/uL (ref 4.22–5.81)
RDW: 13.1 % (ref 11.5–15.5)
WBC: 6.4 10*3/uL (ref 4.0–10.5)
nRBC: 0 % (ref 0.0–0.2)

## 2021-04-27 LAB — COMPREHENSIVE METABOLIC PANEL
ALT: 17 U/L (ref 0–44)
AST: 24 U/L (ref 15–41)
Albumin: 3.9 g/dL (ref 3.5–5.0)
Alkaline Phosphatase: 60 U/L (ref 38–126)
Anion gap: 11 (ref 5–15)
BUN: 19 mg/dL (ref 8–23)
CO2: 24 mmol/L (ref 22–32)
Calcium: 9 mg/dL (ref 8.9–10.3)
Chloride: 101 mmol/L (ref 98–111)
Creatinine, Ser: 1.08 mg/dL (ref 0.61–1.24)
GFR, Estimated: >60 mL/min (ref >60)
Glucose, Bld: 135 mg/dL — ABNORMAL HIGH (ref 70–99)
Potassium: 3.6 mmol/L (ref 3.5–5.1)
Sodium: 136 mmol/L (ref 135–145)
Total Bilirubin: 0.2 mg/dL — ABNORMAL LOW (ref 0.3–1.2)
Total Protein: 7.5 g/dL (ref 6.5–8.1)

## 2021-04-27 NOTE — Progress Notes (Signed)
[name] NOTE  Patient Care Team: Miguel Sanders, MD as PCP - General End, Miguel Gave, MD as PCP - Cardiology (Cardiology) Miguel Pedro, MD as Consulting Physician (Ophthalmology) Miguel Bradley., MD as Referring Physician (Ophthalmology) Miguel Rack, MD as Referring Physician (Dermatology) Miguel Artist, MD as Consulting Physician (Gastroenterology) Miguel Bradley., MD as Referring Physician (Dentistry) Miguel Bradley, [facility] as Pharmacist (Pharmacist) Miguel Sickle, MD as Consulting Physician (Hematology and Oncology)  CHIEF COMPLAINTS/PURPOSE OF CONSULTATION: parotid cancer  Oncology History Overview Note  Addendum    Upon clinician request, additional immunostains are performed at [facility] on block F2, with the following results in the tumor cells:   HER2 ([place], clone [id number]) Interpretation: Positive IHC Score: 3+ (using the ASCO/CAP breast biomarker scoring guidelines; there are no consensus guideline for scoring HER2 in salivary duct carcinoma)   This slide has also been reviewed by Dr. Shelby Bradley [facility] breast pathology) who concurs.  Addendum electronically signed by Miguel Blase, MD on 09/03/2020 at  3:21 PM  Diagnosis    A. "Tissue around stylomastoid foramen"  - Fibrovascular tissue extensively involved by carcinoma   B. "Main trunk facial nerve"  - Nerve and fibrous tissue extensively involved by carcinoma     C. "Distal facial nerve" - Nerve tissue; negative for carcinoma     D. "Proximal mastoid nerve margin" - Nerve tissue; negative for carcinoma   E: Left upper superficial parotid, parotidectomy - Benign parotid parenchyma with focal acute and chronic inflammation - Negative for carcinoma   F: Left inferior parotid with sternocleidomastoid muscle, resection - High-grade salivary gland carcinoma, most consistent with salivary duct carcinoma (see comment and synoptic report for further details) - Cauterized  tumor present at inked deep surgical margin within fibrous tissue adjacent to skeletal muscle - Metastatic carcinoma in 1.1 cm nodal conglomerate and 4 of 4 intact lymph nodes (at least 5/5); positive for extranodal extension    G: Lymph node, left neck level 5, lymphadenectomy - Benign adipose; no lymph nodes or carcinoma identified   H: Lymph node, left neck level 1, lymphadenectomy - Metastatic carcinoma in 1 of 5 lymph nodes (1/5); maximum size of metastasis 2 mm; negative for extranodal extension.   I: Left omohyoid, excision - Benign skeletal muscle; negative for carcinoma   J: Left deep lobe parotid, excision - Benign parotid tissue; negative for carcinoma   K: Parotid tissue, biopsy - Benign parotid tissue - No metastatic carcinoma identified in 1 lymph nodes (0/1)   L: Left tissue over mastoid, biopsy - Benign parotid parenchyma, dense fibrous tissue, and bone; negative for carcinoma   M: "Stylomastoid foramen stitch on proximal nerve", excision - Large nerve, negative for perineural/intraneural carcinoma; stitched margin negative - Attached fibrous tissue and parotid parenchyma positive for carcinoma, focally invading around but not within large nerve segment, with crushed/cauterized tumor present at tissue edges   N: Tissue over styloid process, biopsy - Benign parotid tissue; negative for carcinoma   O: Posterior digastric margin, biopsy - Benign skeletal muscle; negative for carcinoma   P: Left neck dissections level 1 through 5, lymphadenectomy - Metastatic carcinoma in 11 of 26 lymph nodes (11/26); maximum size of metastasis 20 mm; two nodes (16 mm and 14 mm) positive for extranodal extension (ENEma, up to 4 mm beyond capsule)   Diagnosis: 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 -----------------------------------------------------------------   07/13/10: Right parathyroidectomy Pathology: Hypercellular parathyroid gland   -------------------------------------------------------------------------------------   # MARCH 2022- CT chest- [[facility]] Subcentimeter pulmonary nodules measuring up to 0.8 cm, indeterminate. No comparison available. Attention on follow-up is recommended.  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 Stage: pT4aN3b-stage IVb.  HER-2 positive; androgen receptor positive.]  PET scan-negative for distant metastatic disease.   # CAD [card]; DM- 2; CKD-III     Cancer of parotid gland ([place])  09/11/2020 Initial Diagnosis   Cancer of parotid gland ([place])   09/16/2020 Cancer Staging   Staging form: Major Salivary Glands, AJCC 8th Edition - Pathologic: Stage IVB (pT4a, pN3b, cM0) - Signed by Miguel Sickle, MD on 09/16/2020       HISTORY OF PRESENTING ILLNESS: Alone.  Ambulating independently. Miguel Bradley 80 y.o.  male pleasant patient with locally advanced high-grade salivary gland/parotid cancer HER2 positive;AR positive-T4AN3-stage IVb is currently s/p adjuvant radiation; also s/p Eligard August 2022.  In the interim patient a PET scan in October 2022-negative for recurrence. Patient was recently evaluated by surgery at [facility].   Denies any significant hot flashes or joint pains.  Gaining weight. Denies any new lumps or bumps.  Appetite is good.  Mild to moderate fatigue.  Not any worse.  Denies any worsening swelling in the neck.  Complains of neck stiffness.   Review  of Systems  Constitutional:  Positive for malaise/fatigue. Negative for chills, diaphoresis and fever.  HENT:  Negative for nosebleeds and sore throat.   Eyes:  Negative for double vision.  Respiratory:  Negative for cough, hemoptysis, sputum production, shortness of breath and wheezing.   Cardiovascular:  Negative for chest pain, palpitations, orthopnea and leg swelling.  Gastrointestinal:  Negative for abdominal pain, blood in stool, constipation, diarrhea, heartburn, melena, nausea and vomiting.  Genitourinary:  Negative for dysuria, frequency and urgency.  Musculoskeletal:  Positive for joint pain. Negative for back pain.  Skin: Negative.  Negative for itching and rash.  Neurological:  Negative for dizziness, tingling, focal weakness, weakness and headaches.  Endo/Heme/Allergies:  Does not bruise/bleed easily.  Psychiatric/Behavioral:  Negative for depression. The patient is not nervous/anxious and does not have insomnia.     MEDICAL HISTORY:  Past Medical History:  Diagnosis Date   Anemia    Basal cell carcinoma of face    Cataract    Diabetes mellitus without complication ([place])    Erectile dysfunction 06/11/2014   Glaucoma    Hypercalcemia    Hyperparathyroidism, unspecified ([place])    Impotence of organic origin    Other testicular hypofunction    Pure hyperglyceridemia    Special screening for malignant neoplasm of prostate    Special screening for malignant neoplasms, colon    Unspecified essential hypertension     SURGICAL HISTORY: Past Surgical History:  Procedure Laterality Date   COLONOSCOPY  06/13/2018   Dr. Lucio Edward   LEFT HEART CATH AND CORONARY ANGIOGRAPHY Left 03/16/2020   Procedure: LEFT HEART CATH AND CORONARY ANGIOGRAPHY;  Surgeon: Nelva Bush, MD;  Location: [facility];  Service: Cardiovascular;  Laterality: Left;   PARATHYROIDECTOMY  2012   skin cancer removal  Dr. Sharlett Iles   TRIGGER FINGER RELEASE Left 2008    SOCIAL  HISTORY: Social History   Socioeconomic History   Marital status: Married    Spouse name: Not on file   Number of children: 1   Years of education: Not on file   Highest education level: Not on file  Occupational History   Occupation: Water quality scientist at [employer] Use   Smoking status: Former    Years: 25.00    Types: Cigarettes    Quit date: 06/12/1978    Years since quitting: 42.9   Smokeless tobacco: Never  Vaping Use   Vaping Use: Never used  Substance and Sexual Activity   Alcohol use: Yes    Comment: 1 beer every few weeks   Drug use: No   Sexual activity: Not Currently  Other Topics Concern   Not on file  Social History Narrative   No exercise, plays golf. Lives in [place]; self- [place]; daughter. puchasing dept in [place]. Rare alcohol. Quit smoking in 1980.    Social Determinants of Health   Financial Resource Strain: Low Risk    Difficulty of Paying Living Expenses: Not very hard  Food Insecurity: Not on file  Transportation Needs: Not on file  Physical Activity: Not on file  Stress: Not on file  Social Connections: Not on file  Intimate Partner Violence: Not on file    FAMILY HISTORY: Family History  Problem Relation Age of Onset   Hypertension Mother    Dementia Mother    Diabetes Other        aunt   Hypertension Brother    Hypertension Brother    Hypertension Brother    Hyperlipidemia Brother    Hyperlipidemia Brother    Hyperlipidemia Brother    Hypertension Sister    Bladder Cancer Sister    Hyperlipidemia Sister    Colon cancer Neg Hx    Esophageal cancer Neg Hx    Rectal cancer Neg Hx    Stomach cancer Neg Hx     ALLERGIES:  has No Known Allergies.  MEDICATIONS:  Current Outpatient Medications  Medication Sig Dispense Refill   aspirin EC 81 MG tablet Take 81 mg by mouth daily.     diphenhydramine-acetaminophen (TYLENOL PM EXTRA STRENGTH) 25-500 MG TABS tablet Take 1 tablet by mouth at bedtime as needed.     dorzolamide-timolol  (COSOPT) 22.3-6.8 MG/ML ophthalmic solution Place 1 drop into both eyes 2 (two) times daily.      fenofibrate 160 MG tablet TAKE 1 TABLET BY MOUTH EVERY DAY 90 tablet 3   ferrous sulfate 325 (65 FE) MG tablet Take 325 mg by mouth daily with breakfast.     hydrochlorothiazide (HYDRODIURIL) 25 MG tablet TAKE 1 TABLET BY MOUTH EVERY DAY 90 tablet 3   latanoprost (XALATAN) 0.005 % ophthalmic solution Place 1 drop into both eyes at bedtime.      metFORMIN (GLUCOPHAGE) 500 MG tablet TAKE ONE TABLET BY MOUTH TWICE DAILY WITH A MEAL 180 tablet 0   Omega-3 Fatty Acids (FISH OIL) 1000 MG CAPS Take 1 capsule by mouth daily.     rosuvastatin (CRESTOR) 20 MG tablet TAKE 1 TABLET BY MOUTH EVERY DAY 90 tablet 1   nitroGLYCERIN (NITROSTAT) 0.4 MG SL tablet Place 1 tablet (0.4 mg total) under the tongue every 5 (five) minutes as needed for chest pain. 25 tablet prn   No current facility-administered medications for this visit.      Marland Kitchen  PHYSICAL EXAMINATION: ECOG PERFORMANCE  STATUS: 0 - Asymptomatic  Vitals:   04/27/21 0955  BP: 124/69  Pulse: (!) 49  Resp: 16  Temp: (!) 96.9 F (36.1 C)   Filed Weights   04/27/21 0955  Weight: 202 lb (91.6 kg)    Physical Exam HENT:     Head: Normocephalic and atraumatic.     Mouth/Throat:     Pharynx: No oropharyngeal exudate.  Eyes:     Pupils: Pupils are equal, round, and reactive to light.  Neck:     Comments: Incision from the left neck dissection; parotidectomy noted.  Well-healing.  No evidence of any infection. Cardiovascular:     Rate and Rhythm: Normal rate and regular rhythm.  Pulmonary:     Effort: No respiratory distress.     Breath sounds: No wheezing.  Abdominal:     General: Bowel sounds are normal. There is no distension.     Palpations: Abdomen is soft. There is no mass.     Tenderness: There is no abdominal tenderness. There is no guarding or rebound.  Musculoskeletal:        General: No tenderness. Normal range of motion.      Cervical back: Normal range of motion and neck supple.  Skin:    General: Skin is warm.  Neurological:     Mental Status: He is alert and oriented to person, place, and time.     Comments: Left facial droop noted-post surgery.  Psychiatric:        Mood and Affect: Affect normal.     LABORATORY DATA:  I have reviewed the data as listed Lab Results  Component Value Date   WBC 6.4 04/27/2021   HGB 15.1 04/27/2021   HCT 45.8 04/27/2021   MCV 93.9 04/27/2021   PLT 247 04/27/2021   Recent Labs    11/11/20 1015 12/31/20 1003 01/12/21 1019 04/27/21 0934  NA 135 139 140 136  K 3.4* 4.1 4.5 3.6  CL 100 105 103 101  CO2 _0 GLUCOSE 130* 130* 182* 135*  BUN _1 CREATININE 0.96 1.12 1.18 1.08  CALCIUM 9.2 9.0 9.0 9.0  GFRNONAA >60  --  >60 >60  PROT 7.1 6.4 7.1 7.5  ALBUMIN 3.8 3.7 3.9 3.9  AST _2 ALT _3 ALKPHOS 61 64 55 60  BILITOT 0.8 0.4 0.6 0.2*    RADIOGRAPHIC STUDIES: I have personally reviewed the radiological images as listed and agreed with the findings in the report. No results found.  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 Stage: pT4aN3b-stage IVb.  HER-2 positive; androgen receptor positive.] Patient currently s/p adjuvant radiation-[finished July 16th. 2022].  #Clinically no evidence of recurrence.  PET scan October 2022 negative.  Continue Eligard ADT every 6 months.  Again due in February 2023.  Consider CT scan in April 2023.  # CKD- stage III-  [1 BID; tylenol]-STABLE  #Lymphedema neck s/p surgery-would recommend evaluation with [facility] regarding lymphedema treatment/prevention.  Referral made.  # DISPOSITION:  # referral to maureen/OT  re: Lymphedema of  Neck post surgery # follow up end of [id number] [along with Dr.Crsyatl apt]; MD labs- cbc/cmp;Eligard    All questions were answered. The patient knows  to call the clinic with any problems, questions or concerns.    Miguel Sickle, MD 04/27/2021 11:32 AM

## 2021-04-27 NOTE — Assessment & Plan Note (Addendum)
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 Stage: pT4aN3b-stage IVb.  HER-2 positive; androgen receptor positive.] Patient currently s/p adjuvant radiation-[finished July 16th. 2022].  #Clinically no evidence of recurrence.  PET scan October 2022 negative.  Continue Eligard ADT every 6 months.  Again due in February 2023.  Consider CT scan in April 2023.  # CKD- stage III-  [1 BID; tylenol]-STABLE  #Lymphedema neck s/p surgery-would recommend evaluation with [facility] regarding lymphedema treatment/prevention.  Referral made.  # DISPOSITION:  # referral to maureen/OT re: Lymphedema of  Neck post surgery # follow up end of [id number] [along with Dr.Crsyatl apt]; MD labs- cbc/cmp;Eligard

## 2021-04-27 NOTE — Progress Notes (Signed)
Patient denies new problems/concerns today.   °

## 2021-04-28 NOTE — Patient Instructions (Signed)
Dear Miguel Bradley,  Below is a summary of the goals we discussed during our follow up appointment on April 26, 2021. Please contact me anytime with questions or concerns.   Visit Information  Patient Care Plan: CCM Pharmacy Care Plan     Problem Identified: CHL AMB "PATIENT-SPECIFIC PROBLEM"   Note:     Current Barriers:  None identified  Pharmacist Clinical Goal(s):  Patient will contact provider office for questions/concerns as evidenced notation of same in electronic health record through collaboration with PharmD and provider.   Interventions: 1:1 collaboration with Miguel Sanders, MD regarding development and update of comprehensive plan of care as evidenced by provider attestation and co-signature Inter-disciplinary care team collaboration (see longitudinal plan of care) Comprehensive medication review performed; medication list updated in electronic medical record  Hypertension (BP goal <140/90) -Controlled - clinic bp within goal -Current treatment: HCTZ 25 mg - 1 tablet daily -Medications previously tried: none  -Pt has an automatic BP monitor with arm cuff, he states it is difficult to place cuff on arm at times. -Current home readings: (all within past week) 143/80,56; 140/75, 58; 121/66, 54 (today) -Current dietary habits: see diabetes section -Current exercise habits: none formal, golf, tries to stay active -Denies hypotensive/hypertensive symptoms  -Denies any falls, or imbalance -Counseled to monitor BP at home 2-3 weeks, document, and provide log at future appointments -Recommended to continue current medication; Bring BP monitor to next appointment. F/u 3-4 weeks by CCM team for BP log.   Hyperlipidemia: (LDL goal < 70) -Controlled, LDL 70, condition stable and not discussed 04/28/21 -Current treatment: Rosuvastatin 20 mg - 1 tablet daily Fenofibrate 160 mg - 1 tablet daily Fish oil 1200 mg - 1 capsule daily Aspirin 81 mg - 1 tablet daily  (evening) Nitroglycerin 0.4 mg SL - as needed  -Medications previously tried: none reported -Educated on Cholesterol goals;  -Recommended to continue current medication  Diabetes (A1c goal <7%) -Controlled, A1c 6.9 (12/2020), A1c has increased from 6.6 to 6.9 but still within goal. Pt keps a record of home BG log. Most readings within goal for fasting (80-130). He reports is appetite has returned to normal and no longer taking mirtazapine. He tapered himself off recently. His weight was down to 189 with radiation. He would like to keep weight under 200 lbs. -Current medications: Metformin 500 mg - 1 tablet twice daily with meals  -Medications previously tried: none  -Current home glucose readings - checks every other day before breakfast fasting glucose: 126 this morning, 123, 131, 135, 137, 120, 117, 114 post prandial glucose: none -Denies hypoglycemic/hyperglycemic symptoms -Denies any BG < 70 -Current meal patterns:  Meat, potatoes, green beans; limited vegetables Watches what he eats, limits red meats, watches calories, eats more chicken than beef -Educated on A1c and blood sugar goals; We discussed limited portions of carbohydrates.  -Counseled to check feet daily and get yearly eye exams ([facility]/Dr. Zigmund Daniel) -  these are up to date per patient report -Recommended to continue current medication; Continue to monitor home blood glucose levels.   Patient Goals/Self-Care Activities Patient will:  - take medications as prescribed - keep record of home BP readings   Follow Up Plan: Telephone follow up appointment with care management team member scheduled for:  -CCP 6 months  -CCM Team blood pressure log review in 3-4 weeks    Patient verbalizes understanding of instructions provided today and agrees to view in [place].   Debbora Dus, PharmD Clinical Pharmacist [facility] [place]

## 2021-05-03 ENCOUNTER — Other Ambulatory Visit: Payer: Self-pay | Admitting: Family Medicine

## 2021-05-09 ENCOUNTER — Encounter: Payer: Self-pay | Admitting: Occupational Therapy

## 2021-05-09 ENCOUNTER — Other Ambulatory Visit: Payer: Self-pay

## 2021-05-09 ENCOUNTER — Ambulatory Visit: Payer: PPO | Attending: Internal Medicine | Admitting: Occupational Therapy

## 2021-05-09 DIAGNOSIS — I890 Lymphedema, not elsewhere classified: Secondary | ICD-10-CM | POA: Insufficient documentation

## 2021-05-10 ENCOUNTER — Encounter: Payer: Self-pay | Admitting: Occupational Therapy

## 2021-05-10 NOTE — Therapy (Addendum)
[facility] [facility] SERVICES [address] [place], [place], [address] Phone: [phone]   Fax:  [phone]  Occupational Therapy Evaluation  Patient Details  Name: Miguel Bradley MRN: [id number] Date of Birth: 1941/01/22 Referring Provider (OT): Cammie Sickle, MD   Encounter Date: 05/09/2021   OT End of Session - 05/10/21 1550     Visit Number 1    Number of Visits 36   6 sessions initially w/ emphasis on teaching LE self-care   Date for OT Re-Evaluation 08/08/21    OT Start Time 0100    OT Stop Time 0205    OT Time Calculation (min) 65 min    Activity Tolerance Patient tolerated treatment well;No increased pain    Behavior During Therapy [facility] for tasks assessed/performed             Past Medical History:  Diagnosis Date   Anemia    Basal cell carcinoma of face    Cataract    Diabetes mellitus without complication ([place])    Erectile dysfunction 06/11/2014   Glaucoma    Hypercalcemia    Hyperparathyroidism, unspecified ([place])    Impotence of organic origin    Other testicular hypofunction    Pure hyperglyceridemia    Special screening for malignant neoplasm of prostate    Special screening for malignant neoplasms, colon    Unspecified essential hypertension     Past Surgical History:  Procedure Laterality Date   COLONOSCOPY  06/13/2018   Dr. Lucio Edward   LEFT HEART CATH AND CORONARY ANGIOGRAPHY Left 03/16/2020   Procedure: LEFT HEART CATH AND CORONARY ANGIOGRAPHY;  Surgeon: Nelva Bush, MD;  Location: [place] [facility];  Service: Cardiovascular;  Laterality: Left;   PARATHYROIDECTOMY  2012   skin cancer removal     Dr. Sharlett Iles   TRIGGER FINGER RELEASE Left 2008    There were no vitals filed for this visit.   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 Miguel Bradley reports he has not heard of lymphedema before and does not know much about it. He tells me he is not aware of any facial or neck swelling, but he does have tightness in his neck and fullness under his chin since completing therapy. "Somebody told me I would always have that and it would never go away." Pt's goal for his OT visit today is to learn more about lymphedema and learn about how it's treated in case he has it.    Pertinent History S/p 07/13/10 RIGHT PARATHYROIDECTOMY, 08/24/20 L TOTAL PAROTIDECTOMY ; Hx Parotid gland Ca, LN levels 1-5: metastatic carcinoma 11/26 LN. Lateral temporal bone resection and reconstruction with L anterolateral thigh flap. 11/24/20 Completed adjuvant XRT at [facility], 02/03/21- present Began Eligard for androgen deprovation therapy. DM, Diabetic neuropathy, CAD, Basal Cell Carcinoma , face, HTN, Renal insufficiency, ICAO-Left, mild aortic stenosis, glaucoma    Limitations weakness and paralysis of facial muscles-L , xerostomia, taste dysfunction, head forwared posture    Special Tests head and neck circumferential measurements TBA at initial Rx visit. Intake FOTO score TBA at initial Rx visit    Currently in Pain? Yes    Pain Location Neck    Pain Descriptors / Indicators Tightness    Pain  Type Chronic pain    Pain Onset --   s/p cancer surgery and XRT   Pain Frequency Intermittent    Aggravating Factors  unknown by report    Pain Relieving Factors unknown by report    Effect of Pain on Daily Activities unknown by repor               [facility] OT Assessment - 05/10/21 1526       Assessment   Medical Diagnosis Mild, stage II L head/neck lymphedema 2/2 Parotid cancer Rx    Referring Provider (OT) Cammie Sickle, MD    Onset Date/Surgical Date 08/14/20    Prior Therapy none      Precautions   Precautions --   lymphedema precautions   Precaution  Comments INTERNAL CAROTID ARTERY OCCLUSION-left, CAD, DM skin precautions      Balance Screen   Has the patient fallen in the past 6 months No      Prior Function   Level of Independence Independent with basic ADLs;Independent with household mobility without device;Independent with community mobility without device;Independent with homemaking with ambulation;Independent with gait;Independent with transfers;Needs assistance with homemaking    Vocation Retired    Leisure golf, "lady friend"      ADL   Eating/Feeding --   eating and drinking without issue. Weight stable by report   Grooming Independent    Upper Body Bathing Independent    Lower Body Bathing Independent    Upper Body Dressing Independent    Lower Body Dressing Independent    Toilet Transfer Independent    Toileting -  Hygiene Independent    Tub/Shower Transfer Modified independent    Warden/ranger Grab bars      IADL   Prior Level of Function Shopping I    Shopping Takes care of all shopping needs independently    Prior Level of Function Light Housekeeping I    Light Housekeeping Does personal laundry completely;Maintains house alone or with occasional assistance;Performs light daily tasks such as dishwashing, bed making    Prior Level of Function Meal Prep I    Meal Prep Able to complete simple warm meal prep;Plans, prepares and serves adequate meals independently;Able to complete simple cold meal and snack prep    Community Mobility Drives own vehicle      Mobility   Mobility Status Independent      Vision - History   Baseline Vision Wears glasses all the time    Additional Comments Glaucoma noted in record      Activity Tolerance   Activity Tolerance Endurance does not limit participation in activity    Sitting Balance --   wnl     Cognition   Overall Cognitive Status Within Functional Limits for tasks assessed      Observation/Other Assessments   Observations Mild, stage II, L head and  neck lymphedema with chronic, spongy swelling and fibrosis at submental region bilaterally, inferior to L anterior ear and at submandibular region and angle of the jaw.    Skin Integrity radiation fibrosis easiy palpated    Focus on Therapeutic Outcomes (FOTO)  TBA    Outcome Measures circumferential measurements to be completed Rx visit 1      Posture/Postural Control   Posture/Postural Control Postural limitations    Posture Comments mild kyphosis      Sensation   Light Touch --   altered sensation at L face and neck     Coordination   Gross Motor Movements are  Fluid and Coordinated Yes    Fine Motor Movements are Fluid and Coordinated Yes      ROM / Strength   AROM / PROM / Strength AROM;PROM      PROM   Overall PROM Comments Head and neck AROM WFL; shoulders abd BUE WNL:              LYMPHEDEMA/ONCOLOGY QUESTIONNAIRE - 05/10/21 0001       Surgeries   Other Surgery Date --   08/24/20 L total parotidectomyw facial nerve sacrifice, temporal bonme resection, and reconstruction with L anterolateral thigh free flap\   Number Lymph Nodes Removed 0.42      Date Lymphedema/Swelling Started   Date 08/24/20      Treatment   Past Radiation Treatment Yes    Date 11/24/20    Current Hormone Treatment --   Eligard for androgen deprovation     What other symptoms do you have   Are you Having Heaviness or Tightness Yes    Are you having Pain Yes    Do you have infections No                    OT Treatments/Exercises (OP) - 05/10/21 0001       Transfers   Transfers Sit to Stand    Sit to Stand 6: Modified independent (Device/Increase time);With armrests;With upper extremity assist      ADLs   Overall ADLs managing well with extra time as needed, grab bars and walk in shower    ADL Education Given Yes      Manual Therapy   Manual Therapy Edema management                    OT Education - 05/10/21 1549     Education Details Provided Pt education  regarding lymphatic structure and function, etiology, onset patterns and stages of progression. Taught interaction between blood circulatory system and lymphatic circulation.Discussed  impact of gravity and obesity on lymphatic function. Outlined Complete Decongestive Therapy (CDT)  as standard of care and provided in depth information regarding 4 primary components of both Intensive and Self Management Phases, including Manual Lymph Drainage (MLD), compression wrapping and garments, skin care, and therapeutic exercise.    Person(s) Educated Patient    Methods Explanation;Demonstration;Handout    Comprehension Verbalized understanding;Returned demonstration               OT Long Term Goals - 05/11/21 1239       OT LONG TERM GOAL #1   Title Pt will be modified independent with all lymphedema home program companents using printed reference for rference, including simple self-MLD, therapeutic pumping exercises, stretching and AROM ther ex, skin care  and compression PRN.    Baseline Max A    Time 6    Period Days    Status New    Target Date --   6th OT Rx visit     OT LONG TERM GOAL #2   Title Given this patient's Intake score of TBA/100 on the functional outcomes FOTO tool, patient will experience an increase in function of 3 points to improve basic and instrumental ADLs performance, including lymphedema self-care.    Baseline TBA Rx visit 1    Time 12    Period Weeks    Status New    Target Date 08/08/21      OT LONG TERM GOAL #3   Title Pt will demonstrate a 5% girth reduction at  upper and lower neck landmarks and at circumferential measurement of head (under chin) to limit LE progression and to regain maximum AROM .    Baseline MaX A    Time 12    Period Weeks    Status New      OT LONG TERM GOAL #4   Title Pt will be independent with head and upper body postural corrections by DC to maximise breathing and limit negative impact on balance.    Baseline Max A    Time 12     Period Weeks    Status New    Target Date 08/08/21      OT LONG TERM GOAL #5   Title Pt will demonstrate knowlwdge and understanding of lymphedema precautions and prevention strategies by discussing 5 precautions/ strategies using a printed reference ( modified independent)  to imit progression of chronic lymphedema.    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 Pt endorses tightness in his neck and mild swelling in submental region, which he believes started immediately after surgery. Lymphedema, palpable tissue fibrosis, scar tissue, and associated pain/discomfort limits Miguel Bradley ability to perform activities requiring head turning and capital extension, such ads golfing and driving,  Swelling also contributes to body image issues. Miguel Bradley  will benefit from skilled Occupational Therapy a brief  course of Complete Decongestive Therapy (CDT) with emphasis on teaching Pt to perform all lymphedema self-care components, including simple self-MLD, fibrosis techniques and scar massage, facial muscle strengthening, head and neck AROM, and lymphatic pumping therex, skin care to limit infection risk and facilitate flexibility, and compression to limit progression.Typically the sooner lymphedema treatment is implimented, the more effective treatment is, so  We'll closely monitor circumferential measurements for volume reductions and modify plan PRN should patient choose to continue with more clinical MLD.  Without skilled OT for LE care, lymphedema will progress, infection risk increases and further  functional decline is expected.    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;Skin integrity;Strength;Pain;Scar mobility;Sensation;Decreased knowledge of use of DME;Decreased knowledge of precautions    Rehab Potential Good    Clinical Decision Making Several treatment options, min-mod task modification necessary    Comorbidities Affecting Occupational Performance: Presence of comorbidities impacting occupational performance    Modification or Assistance to Complete Evaluation  Min-Moderate modification of tasks or assist with assess necessary to complete eval    OT Frequency 1x / week    OT Duration 6 weeks   1 x weekly for 6 weeks initially w emphasis on teaching LE self care, including simple self MLD, scar massage, terapeutic exercise, skin care and compression. Will monitor circumferential measurements of head and neck for reductions, and extend  Rx PRN   OT Treatment/Interventions Self-care/ADL training;Therapeutic exercise;Manual Therapy;Manual lymph drainage;Therapeutic activities;DME and/or AE instruction;Patient/family education;Other (comment)   Consider HOS compression garment aka facioplast garment for submental area   Consulted and Agree with Plan of Care Patient             Patient will benefit from skilled therapeutic intervention in order to improve the following deficits and impairments:   Body Structure / Function / Physical Skills: Edema, Skin integrity, Strength, Pain, Scar mobility, Sensation, Decreased knowledge of use of DME, Decreased knowledge of precautions       Visit Diagnosis: Lymphedema, not elsewhere classified - Plan: Ot plan of care  cert/re-cert    Problem List Patient Active Problem List   Diagnosis Date Noted   LLQ pain 02/22/2021   Decreased appetite 01/07/2021   Cancer of parotid gland ([place]) 09/11/2020   Mass of left side of neck 08/22/2020   Constipation 07/09/2020   Abnormal cardiac CT angiography 03/16/2020   Coronary artery disease involving native coronary artery of native heart without angina pectoris 03/11/2020   Mild aortic stenosis 03/11/2020   Decreased mobility and endurance 12/30/2019   ICAO (internal carotid artery occlusion), left 07/01/2019   Seasonal allergic rhinitis due to pollen 09/24/2018   Diabetic neuropathy ([place]) 06/21/2017   Class 1 obesity with serious comorbidity and body mass index (BMI) of 32.0 to 32.9 in adult 06/21/2017   Diabetic retinopathy associated with type 2 diabetes mellitus ([place]) 03/12/2015   Counseling regarding advanced care planning and goals of care 12/08/2014   Diabetes mellitus with ophthalmic complication ([place]) 72/53/6644   Renal insufficiency 11/25/2010   Testicular hypofunction 11/17/2009   Hyperlipidemia associated with type 2 diabetes mellitus ([place]) 07/05/2007   CARCINOMA, BASAL CELL, FACE 07/03/2007   Hypertension associated with diabetes ([place]) 07/03/2007   Andrey Spearman, MS, OTR/L, CLT-LANA 05/10/21 4:19 PM   [place] [facility] SERVICES [place], [place], [address] Phone: [phone]   Fax:  [phone]  Name: Miguel Bradley MRN: [id number] Date of Birth: Mar 15, 1941

## 2021-05-11 ENCOUNTER — Other Ambulatory Visit: Payer: Self-pay

## 2021-05-11 ENCOUNTER — Inpatient Hospital Stay: Payer: PPO | Admitting: Occupational Therapy

## 2021-05-11 DIAGNOSIS — E11319 Type 2 diabetes mellitus with unspecified diabetic retinopathy without macular edema: Secondary | ICD-10-CM

## 2021-05-11 DIAGNOSIS — Z7984 Long term (current) use of oral hypoglycemic drugs: Secondary | ICD-10-CM

## 2021-05-11 DIAGNOSIS — I890 Lymphedema, not elsewhere classified: Secondary | ICD-10-CM

## 2021-05-11 DIAGNOSIS — Z87891 Personal history of nicotine dependence: Secondary | ICD-10-CM

## 2021-05-11 DIAGNOSIS — E1159 Type 2 diabetes mellitus with other circulatory complications: Secondary | ICD-10-CM

## 2021-05-11 DIAGNOSIS — I152 Hypertension secondary to endocrine disorders: Secondary | ICD-10-CM

## 2021-05-11 NOTE — Therapy (Signed)
[facility] [address] [place], [place], [place], [address] Phone: [phone]   Fax:  [phone]  Occupational Therapy Screen at the [facility]  Patient Details  Name: Miguel Bradley MRN: [id number] Date of Birth: March 28, 1941 Referring Provider (OT): Cammie Sickle, MD   Encounter Date: 05/11/2021   OT End of Session - 05/11/21 1649     Visit Number 0             Past Medical History:  Diagnosis Date   Anemia    Basal cell carcinoma of face    Cataract    Diabetes mellitus without complication ([place])    Erectile dysfunction 06/11/2014   Glaucoma    Hypercalcemia    Hyperparathyroidism, unspecified ([place])    Impotence of organic origin    Other testicular hypofunction    Pure hyperglyceridemia    Special screening for malignant neoplasm of prostate    Special screening for malignant neoplasms, colon    Unspecified essential hypertension     Past Surgical History:  Procedure Laterality Date   COLONOSCOPY  06/13/2018   Dr. Lucio Edward   LEFT HEART CATH AND CORONARY ANGIOGRAPHY Left 03/16/2020   Procedure: LEFT HEART CATH AND CORONARY ANGIOGRAPHY;  Surgeon: Nelva Bush, MD;  Location: [facility];  Service: Cardiovascular;  Laterality: Left;   PARATHYROIDECTOMY  2012   skin cancer removal     Dr. Sharlett Iles   TRIGGER FINGER RELEASE Left 2008    There were no vitals filed for this visit.   Subjective Assessment - 05/11/21 1647     Subjective  I did see the therapist earlier this week - I did not do anything yet at home - we are starting the 6th Dec when I see her again - then she will show me what to do    Pertinent History S/p 07/13/10 RIGHT PARATHYROIDECTOMY, 08/24/20 L TOTAL PAROTIDECTOMY ; Hx Parotid gland Ca, LN levels 1-5: metastatic carcinoma 11/26 LN. Lateral temporal bone resection and reconstruction with L anterolateral thigh flap. 11/24/20 Completed adjuvant XRT at [facility],  02/03/21- present Began Eligard for androgen deprovation therapy. DM, Diabetic neuropathy, CAD, Basal Cell Carcinoma , face, HTN, Renal insufficiency, ICAO-Left, mild aortic stenosis, glaucoma    Currently in Pain? No/denies               Pt was evaluated by Lymphedema OT at outpt [facility]- and first tx is tomorrow- pt was not aware of appt - but info provided Pt ed on lymphedema symptoms , his symptoms and treatment  Pt do report sleeping on 2 pillows  Pt has scar on L posterior ear to collar with lymphedema anterior to that and above L lateral and anterior neck Pt ed on scar massage and mobs to do 3-5 min day 2 x  And fibrotic techniques to fibrotic areas  Cervical AROM decrease mostly in R rotation and lateral flexion - as well as ext  Pt ed and review to do AROM and stretches in shower for R and L  lateral flexion and rotation   Ext and flexion  12 reps hold 5 sec  Do HEP until next appt with Clarene Critchley to initiate treatment                                 Patient will benefit from skilled therapeutic intervention in order to improve the following deficits and impairments:  Visit Diagnosis: Lymphedema, not elsewhere classified    Problem List Patient Active Problem List   Diagnosis Date Noted   LLQ pain 02/22/2021   Decreased appetite 01/07/2021   Cancer of parotid gland ([place]) 09/11/2020   Mass of left side of neck 08/22/2020   Constipation 07/09/2020   Abnormal cardiac CT angiography 03/16/2020   Coronary artery disease involving native coronary artery of native heart without angina pectoris 03/11/2020   Mild aortic stenosis 03/11/2020   Decreased mobility and endurance 12/30/2019   ICAO (internal carotid artery occlusion), left 07/01/2019   Seasonal allergic rhinitis due to pollen 09/24/2018   Diabetic neuropathy ([place]) 06/21/2017   Class 1 obesity with serious comorbidity and body mass index (BMI) of 32.0 to 32.9 in adult  06/21/2017   Diabetic retinopathy associated with type 2 diabetes mellitus ([place]) 03/12/2015   Counseling regarding advanced care planning and goals of care 12/08/2014   Diabetes mellitus with ophthalmic complication ([place]) 21/97/5883   Renal insufficiency 11/25/2010   Testicular hypofunction 11/17/2009   Hyperlipidemia associated with type 2 diabetes mellitus ([place]) 07/05/2007   CARCINOMA, BASAL CELL, FACE 07/03/2007   Hypertension associated with diabetes ([place]) 07/03/2007    Rosalyn Gess, OTR/L,CLT 05/11/2021, 4:49 PM  [facility] [address] [place], [place], [address] Phone: [phone]   Fax:  [phone]  Name: Miguel Bradley MRN: [id number] Date of Birth: [birth year]

## 2021-05-12 ENCOUNTER — Ambulatory Visit: Payer: PPO | Attending: Internal Medicine | Admitting: Occupational Therapy

## 2021-05-12 DIAGNOSIS — I890 Lymphedema, not elsewhere classified: Secondary | ICD-10-CM | POA: Insufficient documentation

## 2021-05-12 NOTE — Therapy (Signed)
[place] MAIN [facility] SERVICES [address] [place], [place], [address] Phone: [phone]   Fax:  [phone]  Occupational Therapy Treatment  Patient Details  Name: Miguel Bradley MRN: [id number] Date of Birth: 1940/07/09 Referring Provider (OT): Cammie Sickle, MD   Encounter Date: 05/12/2021   OT End of Session - 05/12/21 1444     Visit Number 2    Number of Visits 36    Date for OT Re-Evaluation 08/08/21    OT Start Time 0903    OT Stop Time 1003    OT Time Calculation (min) 60 min    Activity Tolerance Patient tolerated treatment well;No increased pain    Behavior During Therapy [facility] for tasks assessed/performed             Past Medical History:  Diagnosis Date   Anemia    Basal cell carcinoma of face    Cataract    Diabetes mellitus without complication ([place])    Erectile dysfunction 06/11/2014   Glaucoma    Hypercalcemia    Hyperparathyroidism, unspecified ([place])    Impotence of organic origin    Other testicular hypofunction    Pure hyperglyceridemia    Special screening for malignant neoplasm of prostate    Special screening for malignant neoplasms, colon    Unspecified essential hypertension     Past Surgical History:  Procedure Laterality Date   COLONOSCOPY  06/13/2018   Dr. Lucio Edward   LEFT HEART CATH AND CORONARY ANGIOGRAPHY Left 03/16/2020   Procedure: LEFT HEART CATH AND CORONARY ANGIOGRAPHY;  Surgeon: Nelva Bush, MD;  Location: [facility];  Service: Cardiovascular;  Laterality: Left;   PARATHYROIDECTOMY  2012   skin cancer removal     Dr. Sharlett Iles   TRIGGER FINGER RELEASE Left 2008    There were no vitals filed for this visit.   Subjective Assessment - 05/12/21 1452     Subjective  Miguel Bradley presents for OT Rx visit 2/36 to address L head and neck lymphedema 2/2 surgical intervention and XRT for salivary duct carcinoma. Miguel Bradley denies pain in head and neck today.     Pertinent History S/p 07/13/10 RIGHT PARATHYROIDECTOMY, 08/24/20 L TOTAL PAROTIDECTOMY ; Hx Parotid gland Ca, LN levels 1-5: metastatic carcinoma 11/26 LN. Lateral temporal bone resection and reconstruction with L anterolateral thigh flap. 11/24/20 Completed adjuvant X[facility] at [facility], 02/03/21- present Began Eligard for androgen deprovation therapy. DM, Diabetic neuropathy, CAD, Basal Cell Carcinoma , face, HTN, Renal insufficiency, ICAO-Left, mild aortic stenosis, glaucoma    Limitations weakness and paralysis of facial muscles-L , xerostomia, taste dysfunction, head forwared posture, neck tightness w/ decreased AROM, chronic head / neck swelling    Repetition Increases Symptoms    Special Tests Intake FOTO: 98/100    Currently in Pain? Yes    Pain Location Neck    Pain Descriptors / Indicators Tightness    Pain Type Chronic pain    Pain Onset More than a month ago   s/p cancer surgery and XRT                LYMPHEDEMA/ONCOLOGY QUESTIONNAIRE - 05/12/21 1512       Lymphedema Stage   Stage STAGE 2 SPONTANEOUSLY IRREVERSIBLE      Lymphedema Assessments   Lymphedema Assessments Head and Neck      Head and Neck   Other superior neck 38.6 cm    Other Inferior neck 44.5 cm    Other face circ  70 cm                     OT Treatments/Exercises (OP) - 05/12/21 1455       ADLs   ADL Education Given Yes      Manual Therapy   Manual Therapy Edema management;Manual Lymphatic Drainage (MLD)    Manual therapy comments photos taken for  before/ after comparison    Edema Management initial circumferential measurements of head and neck    Manual Lymphatic Drainage (MLD) fibrosis techniques, scar massage and gentle MFR to L lateral neck        and submental region in seated position.                    OT Education - 05/12/21 1558     Education Details Miguel Bradley edu for scar massage, fibrosis techniques and intor level MLD. Miguel Bradley edu for small hand sized vibrator masager. Provided web  link for purchase PRN    Person(s) Educated Patient    Methods Explanation;Demonstration;Handout    Comprehension Verbalized understanding;Returned demonstration                 OT Long Term Goals - 05/11/21 1239       OT LONG TERM GOAL #1   Title Miguel Bradley will be modified independent with all lymphedema home program companents using printed reference for rference, including simple self-MLD, therapeutic pumping exercises, stretching and AROM ther ex, skin care  and compression PRN.    Baseline Max A    Time 6    Period Days    Status New    Target Date --   6th OT Rx visit     OT LONG TERM GOAL #2   Title Given this patient's Intake score of TBA/100 on the functional outcomes FOTO tool, patient will experience an increase in function of 3 points to improve basic and instrumental ADLs performance, including lymphedema self-care.    Baseline TBA Rx visit 1    Time 12    Period Weeks    Status New    Target Date 08/08/21      OT LONG TERM GOAL #3   Title Miguel Bradley will demonstrate a 5% girth reduction at upper and lower neck landmarks and at circumferential measurement of head (under chin) to limit LE progression and to regain maximum AROM .    Baseline MaX A    Time 12    Period Weeks    Status New      OT LONG TERM GOAL #4   Title Miguel Bradley will be independent with head and upper body postural corrections by DC to maximise breathing and limit negative impact on balance.    Baseline Max A    Time 12    Period Weeks    Status New    Target Date 08/08/21      OT LONG TERM GOAL #5   Title Miguel Bradley will demonstrate knowlwdge and understanding of lymphedema precautions and prevention strategies by discussing 5 precautions/ strategies using a printed reference ( modified independent)  to imit progression of chronic lymphedema.    Baseline Max A    Time 6    Period Days    Status New    Target Date --   6th LE Rx visit                  Plan - 05/12/21 1445     Clinical Impression  Statement Commenced modified Intensive  Phase CDT to L head and neck today. Completed initial circumferential measurements of face and neck revealing 70 cm circumference at face ( 2 cm anterior to tragus), 38.6 cm circ at upper neck and 38.6 cm at inferior neck. Miguel Bradley denies difficulty swallowing. Miguel Bradley is clearly spoken today. Reviewed superficial and deep lymphatic anatomy in relation to surgical scar formation and wound healing, radiation fibrosis and high protien fibrosis resulting from lymphedema. Completed scar massage and fibrosis techniques while avoiding L carotid region. Miguel Bradley tolerated manual therapy without increased pain. Next session begin teaching lymphatic pumping ther ex. stretches for flexibility and AROM ther ex. Cont as per POC.    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;Skin integrity;Strength;Pain;Scar mobility;Sensation;Decreased knowledge of use of DME;Decreased knowledge of precautions    Rehab Potential Good    Clinical Decision Making Several treatment options, min-mod task modification necessary    Comorbidities Affecting Occupational Performance: Presence of comorbidities impacting occupational performance    Modification or Assistance to Complete Evaluation  Min-Moderate modification of tasks or assist with assess necessary to complete eval    OT Frequency 1x / week    OT Duration 6 weeks   1 x weekly for 6 weeks initially w emphasis on teaching LE self care, including simple self MLD, scar massage, terapeutic exercise, skin care and compression. Will monitor circumferential measurements of head and neck for reductions, and extend Rx PRN   OT Treatment/Interventions  Self-care/ADL training;Therapeutic exercise;Manual Therapy;Manual lymph drainage;Therapeutic activities;DME and/or AE instruction;Patient/family education;Other (comment)   Consider HOS compression garment aka facioplast garment for submental area   Consulted and Agree with Plan of Care Patient             Patient will benefit from skilled therapeutic intervention in order to improve the following deficits and impairments:   Body Structure / Function / Physical Skills: Edema, Skin integrity, Strength, Pain, Scar mobility, Sensation, Decreased knowledge of use of DME, Decreased knowledge of precautions       Visit Diagnosis: Lymphedema, not elsewhere classified    Problem List Patient Active Problem List   Diagnosis Date Noted   LLQ pain 02/22/2021   Decreased appetite 01/07/2021   Cancer of parotid gland ([place]) 09/11/2020   Mass of left side of neck 08/22/2020   Constipation 07/09/2020   Abnormal cardiac CT angiography 03/16/2020   Coronary artery disease involving native coronary artery of native heart without angina pectoris 03/11/2020   Mild aortic stenosis 03/11/2020   Decreased mobility and endurance 12/30/2019   ICAO (internal carotid artery occlusion), left 07/01/2019   Seasonal allergic rhinitis due to pollen 09/24/2018   Diabetic neuropathy ([place]) 06/21/2017   Class 1 obesity with serious comorbidity and body mass index (BMI) of 32.0 to 32.9 in adult 06/21/2017   Diabetic retinopathy associated with type 2 diabetes mellitus ([place]) 03/12/2015   Counseling regarding advanced care planning and goals of care 12/08/2014   Diabetes mellitus with ophthalmic complication ([place]) 14/48/1856   Renal insufficiency 11/25/2010   Testicular hypofunction 11/17/2009   Hyperlipidemia associated with type 2 diabetes mellitus ([place]) 07/05/2007   CARCINOMA, BASAL CELL, FACE 07/03/2007   Hypertension associated with diabetes ([place]) 07/03/2007    Andrey Spearman, MS, OTR/L,  CLT-LANA 05/12/21 4:00 PM  [place] MAIN [facility] SERVICES [address] [place], [place], [address] Phone: [phone]   Fax:  [phone]  Name: Miguel Bradley MRN: [id number] Date of Birth: 20-May-1941

## 2021-05-17 ENCOUNTER — Ambulatory Visit: Payer: PPO | Admitting: Occupational Therapy

## 2021-05-17 ENCOUNTER — Other Ambulatory Visit: Payer: Self-pay

## 2021-05-17 DIAGNOSIS — I890 Lymphedema, not elsewhere classified: Secondary | ICD-10-CM

## 2021-05-17 NOTE — Therapy (Signed)
[place] MAIN [facility] SERVICES [address] [place], [place], [address] Phone: [phone]   Fax:  [phone]  Occupational Therapy Treatment  Patient Details  Name: Miguel Bradley MRN: [id number] Date of Birth: Mar 01, 1941 Referring Provider (OT): Cammie Sickle, MD   Encounter Date: 05/17/2021   OT End of Session - 05/17/21 1058     Visit Number 3    Number of Visits 36    Date for OT Re-Evaluation 08/08/21    OT Start Time 0900    OT Stop Time 1005    OT Time Calculation (min) 65 min    Activity Tolerance Patient tolerated treatment well;No increased pain    Behavior During Therapy [facility] for tasks assessed/performed             Past Medical History:  Diagnosis Date   Anemia    Basal cell carcinoma of face    Cataract    Diabetes mellitus without complication ([place])    Erectile dysfunction 06/11/2014   Glaucoma    Hypercalcemia    Hyperparathyroidism, unspecified ([place])    Impotence of organic origin    Other testicular hypofunction    Pure hyperglyceridemia    Special screening for malignant neoplasm of prostate    Special screening for malignant neoplasms, colon    Unspecified essential hypertension     Past Surgical History:  Procedure Laterality Date   COLONOSCOPY  06/13/2018   Dr. Lucio Edward   LEFT HEART CATH AND CORONARY ANGIOGRAPHY Left 03/16/2020   Procedure: LEFT HEART CATH AND CORONARY ANGIOGRAPHY;  Surgeon: Nelva Bush, MD;  Location: [facility];  Service: Cardiovascular;  Laterality: Left;   PARATHYROIDECTOMY  2012   skin cancer removal     Dr. Sharlett Iles   TRIGGER FINGER RELEASE Left 2008    There were no vitals filed for this visit.                 OT Treatments/Exercises (OP) - 05/17/21 1518       ADLs   ADL Education Given Yes      Manual Therapy   Manual Therapy Edema management;Manual Lymphatic Drainage (MLD)    Manual Lymphatic Drainage (MLD) MLD to left  head and neck avoiding deep strokes to carotid region in keeping with precautions for this patient. Directed lymph congestion using  proximal to distal method  working segmentally back wards from L axila  to chest, then from clavicular region, then from chin, submental and jawline towards clavicle,  then stimulating pre and post auricular nodes,  and finally using light J strokes accross face towards ears.. Unfortunately we did not have  time to perform fibrosis techniques or scar massage, but was able to demonstrate and review "marching soldiers" for Pt and friend.                    OT Education - 05/17/21 1102     Education Details Continued Pt/ CG edu for lymphedema self care  and home program throughout session. Topics include compression options, simple self-MLD, therapeutic lymphatic pumping exercises, skin/nail care, risk reduction factors and LE precautions. Emphasis on Pt and sig other training for lymphatic map of head and neck and simplified sequencing for self MLD using axillary watershed. All questions answered to the Pt's satisfaction, and Pt demonstrates understanding by report.    Person(s) Educated Patient    Methods Explanation;Demonstration;Handout    Comprehension Verbalized understanding;Returned demonstration  OT Long Term Goals - 05/11/21 1239       OT LONG TERM GOAL #1   Title Pt will be modified independent with all lymphedema home program companents using printed reference for rference, including simple self-MLD, therapeutic pumping exercises, stretching and AROM ther ex, skin care  and compression PRN.    Baseline Max A    Time 6    Period Days    Status New    Target Date --   6th OT Rx visit     OT LONG TERM GOAL #2   Title Given this patient's Intake score of TBA/100 on the functional outcomes FOTO tool, patient will experience an increase in function of 3 points to improve basic and instrumental ADLs performance, including  lymphedema self-care.    Baseline TBA Rx visit 1    Time 12    Period Weeks    Status New    Target Date 08/08/21      OT LONG TERM GOAL #3   Title Pt will demonstrate a 5% girth reduction at upper and lower neck landmarks and at circumferential measurement of head (under chin) to limit LE progression and to regain maximum AROM .    Baseline MaX A    Time 12    Period Weeks    Status New      OT LONG TERM GOAL #4   Title Pt will be independent with head and upper body postural corrections by DC to maximise breathing and limit negative impact on balance.    Baseline Max A    Time 12    Period Weeks    Status New    Target Date 08/08/21      OT LONG TERM GOAL #5   Title Pt will demonstrate knowlwdge and understanding of lymphedema precautions and prevention strategies by discussing 5 precautions/ strategies using a printed reference ( modified independent)  to imit progression of chronic lymphedema.    Baseline Max A    Time 6    Period Days    Status New    Target Date --   6th LE Rx visit                  Plan - 05/17/21 1527     Clinical Impression Statement Commenced MLD to L head and neck avoiding deep strokes to carotid region in keeping with precautions for this patient. Directed lymph congestion using  proximal to distal method  working segmentally back wards from L axila  to chest, then from clavicular region, then from chin, submental and jawline towards clavicle,  then stimulating pre and post auricular nodes,  and finally using light J strokes accross face towards ears.. Unfortunately we did not have  time to perform fibrosis techniques or scar massage, but was able to demonstrate and review "marching soldiers" for Pt and friend.Provided Pt and friend education throughout session re anatomy and lymphatic function related to MLD. Taught  J strokes and scar massage for simple self MLD, taugt and finally diaphragmatic breathing. Excellent return. Provided handouts  for reference.    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;Skin integrity;Strength;Pain;Scar mobility;Sensation;Decreased knowledge of use of DME;Decreased knowledge of precautions    Rehab Potential Good  Clinical Decision Making Several treatment options, min-mod task modification necessary    Comorbidities Affecting Occupational Performance: Presence of comorbidities impacting occupational performance    Modification or Assistance to Complete Evaluation  Min-Moderate modification of tasks or assist with assess necessary to complete eval    OT Frequency 1x / week    OT Duration 6 weeks   1 x weekly for 6 weeks initially w emphasis on teaching LE self care, including simple self MLD, scar massage, terapeutic exercise, skin care and compression. Will monitor circumferential measurements of head and neck for reductions, and extend Rx PRN   OT Treatment/Interventions Self-care/ADL training;Therapeutic exercise;Manual Therapy;Manual lymph drainage;Therapeutic activities;DME and/or AE instruction;Patient/family education;Other (comment)   Consider HOS compression garment aka facioplast garment for submental area   Consulted and Agree with Plan of Care Patient             Patient will benefit from skilled therapeutic intervention in order to improve the following deficits and impairments:   Body Structure / Function / Physical Skills: Edema, Skin integrity, Strength, Pain, Scar mobility, Sensation, Decreased knowledge of use of DME, Decreased knowledge of precautions       Visit Diagnosis: Lymphedema, not elsewhere classified    Problem List Patient Active Problem List    Diagnosis Date Noted   LLQ pain 02/22/2021   Decreased appetite 01/07/2021   Cancer of parotid gland ([place]) 09/11/2020   Mass of left side of neck 08/22/2020   Constipation 07/09/2020   Abnormal cardiac CT angiography 03/16/2020   Coronary artery disease involving native coronary artery of native heart without angina pectoris 03/11/2020   Mild aortic stenosis 03/11/2020   Decreased mobility and endurance 12/30/2019   ICAO (internal carotid artery occlusion), left 07/01/2019   Seasonal allergic rhinitis due to pollen 09/24/2018   Diabetic neuropathy ([place]) 06/21/2017   Class 1 obesity with serious comorbidity and body mass index (BMI) of 32.0 to 32.9 in adult 06/21/2017   Diabetic retinopathy associated with type 2 diabetes mellitus ([place]) 03/12/2015   Counseling regarding advanced care planning and goals of care 12/08/2014   Diabetes mellitus with ophthalmic complication ([place]) 57/32/2025   Renal insufficiency 11/25/2010   Testicular hypofunction 11/17/2009   Hyperlipidemia associated with type 2 diabetes mellitus ([place]) 07/05/2007   CARCINOMA, BASAL CELL, FACE 07/03/2007   Hypertension associated with diabetes ([place]) 07/03/2007   Andrey Spearman, MS, OTR/L, CLT-LANA 05/17/21 3:31 PM   [place] MAIN [facility] SERVICES [address] [place], [place], [address] Phone: [phone]   Fax:  [phone]  Name: Miguel Bradley MRN: [id number] Date of Birth: [birth year]

## 2021-05-17 NOTE — Patient Instructions (Signed)
Practice deep diaphragmatic breathing intermittently throughout the day

## 2021-05-18 DIAGNOSIS — Z85828 Personal history of other malignant neoplasm of skin: Secondary | ICD-10-CM | POA: Diagnosis not present

## 2021-05-18 DIAGNOSIS — D2261 Melanocytic nevi of right upper limb, including shoulder: Secondary | ICD-10-CM | POA: Diagnosis not present

## 2021-05-18 DIAGNOSIS — D225 Melanocytic nevi of trunk: Secondary | ICD-10-CM | POA: Diagnosis not present

## 2021-05-18 DIAGNOSIS — L570 Actinic keratosis: Secondary | ICD-10-CM | POA: Diagnosis not present

## 2021-05-18 DIAGNOSIS — D2262 Melanocytic nevi of left upper limb, including shoulder: Secondary | ICD-10-CM | POA: Diagnosis not present

## 2021-05-18 DIAGNOSIS — D2272 Melanocytic nevi of left lower limb, including hip: Secondary | ICD-10-CM | POA: Diagnosis not present

## 2021-05-18 DIAGNOSIS — D485 Neoplasm of uncertain behavior of skin: Secondary | ICD-10-CM | POA: Diagnosis not present

## 2021-05-23 ENCOUNTER — Telehealth: Payer: Self-pay

## 2021-05-23 NOTE — Progress Notes (Addendum)
Chronic Care Management Pharmacy Assistant   Name: Miguel Bradley  MRN: [id number] DOB: 04-12-1941  Reason for Encounter: CCM (Hypertension Disease State)  Recent office visits:  None since last CCM contact  Recent consult visits:  05/17/2021 - Occupational Therapy - Patient presented for occupational therapy.  05/12/2021 - Occupational Therapy - Patient presented for occupational therapy.  05/11/2021 - Oncology - Patient presented for occupational therapy screening.  05/09/2021 - Oupatient Rehab - Patient presented for evaluation and treatment of left head and neck lymphedema s/p radical excision of L salivary duct carcinoma, adjuvant XRT and and facial reconstruction. Rehabilitation 1 time a week for six weeks.  04/27/2021 - Oncology - Patient presented for follow up for cancer of parotid gland. Labs: CBC and CMP. Referral to Occupational Therapy. No medication changes.   Hospital visits:  None in previous 6 months  Medications: Outpatient Encounter Medications as of 05/23/2021  Medication Sig   aspirin EC 81 MG tablet Take 81 mg by mouth daily.   dorzolamide-timolol (COSOPT) 22.3-6.8 MG/ML ophthalmic solution Place 1 drop into both eyes 2 (two) times daily.    fenofibrate 160 MG tablet TAKE 1 TABLET BY MOUTH EVERY DAY   ferrous sulfate 325 (65 FE) MG tablet Take 325 mg by mouth daily with breakfast.   hydrochlorothiazide (HYDRODIURIL) 25 MG tablet TAKE 1 TABLET BY MOUTH EVERY DAY   latanoprost (XALATAN) 0.005 % ophthalmic solution Place 1 drop into both eyes at bedtime.    metFORMIN (GLUCOPHAGE) 500 MG tablet TAKE ONE TABLET BY MOUTH TWICE DAILY WITH A MEAL   nitroGLYCERIN (NITROSTAT) 0.4 MG SL tablet Place 1 tablet (0.4 mg total) under the tongue every 5 (five) minutes as needed for chest pain.   Omega-3 Fatty Acids (FISH OIL) 1000 MG CAPS Take 1 capsule by mouth daily.   rosuvastatin (CRESTOR) 20 MG tablet TAKE 1 TABLET BY MOUTH EVERY DAY   No facility-administered encounter  medications on file as of 05/23/2021.   Recent Office Vitals: BP Readings from Last 3 Encounters:  04/27/21 124/69  01/12/21 130/73  01/07/21 110/62   Pulse Readings from Last 3 Encounters:  04/27/21 (!) 49  01/12/21 76  01/07/21 (!) 54    Wt Readings from Last 3 Encounters:  04/27/21 202 lb (91.6 kg)  03/24/21 198 lb (89.8 kg)  01/12/21 195 lb 9.6 oz (88.7 kg)    Kidney Function Lab Results  Component Value Date/Time   CREATININE 1.08 04/27/2021 09:34 AM   CREATININE 1.18 01/12/2021 10:19 AM   GFR 62.51 12/31/2020 10:03 AM   GFRNONAA >60 04/27/2021 09:34 AM   GFRAA >60 03/10/2020 04:36 PM   BMP Latest Ref Rng & Units 04/27/2021 01/12/2021 12/31/2020  Glucose 70 - 99 mg/dL 135(H) 182(H) 130(H)  BUN 8 - 23 mg/dL 19 18 15   Creatinine 0.61 - 1.24 mg/dL 1.08 1.18 1.12  BUN/Creat Ratio 10 - 24 - - -  Sodium 135 - 145 mmol/L 136 140 139  Potassium 3.5 - 5.1 mmol/L 3.6 4.5 4.1  Chloride 98 - 111 mmol/L 101 103 105  CO2 22 - 32 mmol/L 24 30 26   Calcium 8.9 - 10.3 mg/dL 9.0 9.0 9.0   Contacted patient on 05/23/2021 to discuss hypertension disease state  Current antihypertensive regimen:  HCTZ 25 mg - 1 tablet daily  Patient verbally confirms he is taking the above medications as directed. Yes  How often are you checking your Blood Pressure? 1-2x per week  he checks his blood pressure in the  morning before taking his medication.  Date  Blood Pressure Reading 12/09  126/69 12/05  145/77 11/29  146/83 11/26  167/89  Wrist or arm cuff: Arm Cuff Caffeine intake: Patient drinks coffee in the morning Salt intake: Limits salt OTC medications including pseudoephedrine or NSAIDs? Patient states he is not taking anything OTC.   Any readings above 180/120? No  What recent interventions/DTPs have been made by any provider to improve Blood Pressure control since last CPP Visit: No recent interventions noted  Any recent hospitalizations or ED visits since last visit with CPP?  No  What diet changes have been made to improve Blood Pressure Control?  Patient states he has been eating a lot of chicken and watching what he eats.   What exercise is being done to improve your Blood Pressure Control?  Patient tries to be active and play golf when the weather permits.   Adherence Review: Is the patient currently on ACE/ARB medication? No Does the patient have >5 day gap between last estimated fill dates? No  Star Rating Drugs:  Medication:  Last Fill: Day Supply Metformin 500 mg 05/03/2021      90         Rosuvastatin 20 mg 03/23/2021 90   Care Gaps: Annual wellness visit in last year? Yes 07/09/2020 Most Recent BP reading: 124/69 on 04/27/2021  If Diabetic: Most recent A1C reading: 6.9 on 12/31/2020 Last eye exam / retinopathy screening: Up to date Last diabetic foot exam: Up to date  PCP appointment on 06/21/2021  Debbora Dus, CPP notified  Marijean Niemann, [place] Assistant [phone]  I have reviewed the care management and care coordination activities outlined in this encounter and I am certifying that I agree with the content of this note. No further action required.  Debbora Dus, PharmD Clinical Pharmacist [facility] [phone]

## 2021-05-24 ENCOUNTER — Other Ambulatory Visit: Payer: Self-pay

## 2021-05-24 ENCOUNTER — Ambulatory Visit: Payer: PPO | Admitting: Occupational Therapy

## 2021-05-24 DIAGNOSIS — I890 Lymphedema, not elsewhere classified: Secondary | ICD-10-CM

## 2021-05-24 NOTE — Therapy (Signed)
[place] MAIN [facility] SERVICES [address] [place], [place], [address] Phone: [phone]   Fax:  [phone]  Occupational Therapy Treatment  Patient Details  Name: Miguel Bradley MRN: [id number] Date of Birth: 1941-02-09 Referring Provider (OT): Miguel Sickle, MD   Encounter Date: 05/24/2021   OT End of Session - 05/24/21 1504     Visit Number 4    Number of Visits 36    Date for OT Re-Evaluation 08/08/21    OT Start Time 0100    OT Stop Time 0205    OT Time Calculation (min) 65 min    Activity Tolerance Patient tolerated treatment well;No increased pain    Behavior During Therapy [facility] for tasks assessed/performed             Past Medical History:  Diagnosis Date   Anemia    Basal cell carcinoma of face    Cataract    Diabetes mellitus without complication ([place])    Erectile dysfunction 06/11/2014   Glaucoma    Hypercalcemia    Hyperparathyroidism, unspecified ([place])    Impotence of organic origin    Other testicular hypofunction    Pure hyperglyceridemia    Special screening for malignant neoplasm of prostate    Special screening for malignant neoplasms, colon    Unspecified essential hypertension     Past Surgical History:  Procedure Laterality Date   COLONOSCOPY  06/13/2018   Dr. Lucio Edward   LEFT HEART CATH AND CORONARY ANGIOGRAPHY Left 03/16/2020   Procedure: LEFT HEART CATH AND CORONARY ANGIOGRAPHY;  Surgeon: Nelva Bush, MD;  Location: [place] [facility];  Service: Cardiovascular;  Laterality: Left;   PARATHYROIDECTOMY  2012   skin cancer removal     Dr. Sharlett Iles   TRIGGER FINGER RELEASE Left 2008    There were no vitals filed for this visit.   Subjective Assessment - 05/24/21 1512     Subjective  Miguel Bradley presents for OT Rx visit 3/36 to address L head and neck lymphedema 2/2 surgical intervention and XRT for salivary duct carcinoma. Miguel Bradley denies pain in head and neck today. Miguel Bradley  brings battery operated hand held mini massager to clinic for training.    Pertinent History S/p 07/13/10 RIGHT PARATHYROIDECTOMY, 08/24/20 L TOTAL PAROTIDECTOMY ; Hx Parotid gland Ca, LN levels 1-5: metastatic carcinoma 11/26 LN. Lateral temporal bone resection and reconstruction with L anterolateral thigh flap. 11/24/20 Completed adjuvant XRT at [facility], 02/03/21- present Began Eligard for androgen deprovation therapy. DM, Diabetic neuropathy, CAD, Basal Cell Carcinoma , face, HTN, Renal insufficiency, ICAO-Left, mild aortic stenosis, glaucoma    Limitations weakness and paralysis of facial muscles-L , xerostomia, taste dysfunction, head forwared posture, neck tightness w/ decreased AROM, chronic head / neck swelling    Repetition Increases Symptoms    Special Tests Intake FOTO: 98/100    Pain Onset More than a month ago   s/p cancer surgery and XRT                         OT Treatments/Exercises (OP) - 05/24/21 1515       ADLs   ADL Education Given Yes      Manual Therapy   Manual Therapy Edema management;Manual Lymphatic Drainage (MLD)    Manual Lymphatic Drainage (MLD) MLD to left head and neck avoiding deep strokes to carotid region in keeping with precautions for this patient. Directed lymph congestion using  proximal to distal  method  working segmentally back wards from L axila  to chest, then from clavicular region, then from chin, submental and jawline towards clavicle,  then stimulating pre and post auricular nodes,  and finally using light J strokes accross face towards ears.. Unfortunately we did not have  time to perform fibrosis techniques or scar massage, but was able to demonstrate and review "marching soldiers" for Miguel Bradley and friend.                    OT Education - 05/24/21 1519     Education Details Continued Miguel Bradley/ CG edu for lymphedema self care  and home program throughout session. Topics include compression options, simple self-MLD, therapeutic lymphatic  pumping exercises, skin/nail care, risk reduction factors and LE precautions. Emphasis on Miguel Bradley and sig other training for lymphatic map of head and neck and simplified sequencing for self MLD using axillary watershed. All questions answered to the Miguel Bradley's satisfaction, and Miguel Bradley demonstrates understanding by report.    Person(s) Educated Patient    Methods Explanation;Demonstration;Handout    Comprehension Verbalized understanding;Returned demonstration                 OT Long Term Goals - 05/11/21 1239       OT LONG TERM GOAL #1   Title Miguel Bradley will be modified independent with all lymphedema home program companents using printed reference for rference, including simple self-MLD, therapeutic pumping exercises, stretching and AROM ther ex, skin care  and compression PRN.    Baseline Max A    Time 6    Period Days    Status New    Target Date --   6th OT Rx visit     OT LONG TERM GOAL #2   Title Given this patients Intake score of TBA/100 on the functional outcomes FOTO tool, patient will experience an increase in function of 3 points to improve basic and instrumental ADLs performance, including lymphedema self-care.    Baseline TBA Rx visit 1    Time 12    Period Weeks    Status New    Target Date 08/08/21      OT LONG TERM GOAL #3   Title Miguel Bradley will demonstrate a 5% girth reduction at upper and lower neck landmarks and at circumferential measurement of head (under chin) to limit LE progression and to regain maximum AROM .    Baseline MaX A    Time 12    Period Weeks    Status New      OT LONG TERM GOAL #4   Title Miguel Bradley will be independent with head and upper body postural corrections by DC to maximise breathing and limit negative impact on balance.    Baseline Max A    Time 12    Period Weeks    Status New    Target Date 08/08/21      OT LONG TERM GOAL #5   Title Miguel Bradley will demonstrate knowlwdge and understanding of lymphedema precautions and prevention strategies by discussing 5  precautions/ strategies using a printed reference ( modified independent)  to imit progression of chronic lymphedema.    Baseline Max A    Time 6    Period Days    Status New    Target Date --   6th LE Rx visit                  Plan - 05/24/21 1504     Clinical Impression Statement Emphasis of visit  on Miguel Bradley edu for LE self-care home program for therapeutic lexercise, including lhead/neck lymphatic pumping exercise, sustained stretching and head/ neck AROM ther ex, mouth and face stretching and muscle pumping, and diaphragmatic breathing. Provided edu re importance of upright head and neck posture throughout the day for optimal spinal alignment (correcting head forward) , swallowing, and limiting fluid collection and fibrosis buildup in submental area. Also discussed importance of correct sleeping posture to for optimal lymphatic flow at night. We used hand held Sales promotion account executive and instructed how to use flat head on "wattle area" fibrosis, and rounded head aloung fibrosis in periaricular areas and along surgical scap. Good returmn. Emphasised slow deliberate strokes for optimal skin glide. Last 15 minutes of session provided MLD to  chest inferior to clavical clearing towards towards the ipsilateral axillary LN. Good session.    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;Skin integrity;Strength;Pain;Scar mobility;Sensation;Decreased knowledge of use of DME;Decreased knowledge of precautions    Rehab Potential Good    Clinical Decision Making Several treatment options, min-mod task modification necessary    Comorbidities Affecting Occupational Performance: Presence of comorbidities  impacting occupational performance    Modification or Assistance to Complete Evaluation  Min-Moderate modification of tasks or assist with assess necessary to complete eval    OT Frequency 1x / week    OT Duration 6 weeks   1 x weekly for 6 weeks initially w emphasis on teaching LE self care, including simple self MLD, scar massage, terapeutic exercise, skin care and compression. Will monitor circumferential measurements of head and neck for reductions, and extend Rx PRN   OT Treatment/Interventions Self-care/ADL training;Therapeutic exercise;Manual Therapy;Manual lymph drainage;Therapeutic activities;DME and/or AE instruction;Patient/family education;Other (comment)   Consider HOS compression garment aka facioplast garment for submental area   Consulted and Agree with Plan of Care Patient             Patient will benefit from skilled therapeutic intervention in order to improve the following deficits and impairments:   Body Structure / Function / Physical Skills: Edema, Skin integrity, Strength, Pain, Scar mobility, Sensation, Decreased knowledge of use of DME, Decreased knowledge of precautions       Visit Diagnosis: Lymphedema, not elsewhere classified    Problem List Patient Active Problem List   Diagnosis Date Noted   LLQ pain 02/22/2021   Decreased appetite 01/07/2021   Cancer of parotid gland ([place]) 09/11/2020   Mass of left side of neck 08/22/2020   Constipation 07/09/2020   Abnormal cardiac CT angiography 03/16/2020   Coronary artery disease involving native coronary artery of native heart without angina pectoris 03/11/2020   Mild aortic stenosis 03/11/2020   Decreased mobility and endurance 12/30/2019   ICAO (internal carotid artery occlusion), left 07/01/2019   Seasonal allergic rhinitis due to pollen 09/24/2018   Diabetic neuropathy ([place]) 06/21/2017   Class 1 obesity with serious comorbidity and body mass index (BMI) of 32.0 to 32.9 in adult 06/21/2017   Diabetic  retinopathy associated with type 2 diabetes mellitus ([place]) 03/12/2015   Counseling regarding advanced care planning and goals of care 12/08/2014   Diabetes mellitus with ophthalmic complication ([place]) 16/03/9603   Renal insufficiency 11/25/2010   Testicular hypofunction 11/17/2009   Hyperlipidemia associated with type 2 diabetes mellitus (  [place]) 07/05/2007   CARCINOMA, BASAL CELL, FACE 07/03/2007   Hypertension associated with diabetes ([place]) 07/03/2007    Andrey Spearman, MS, OTR/L, [facility] 05/24/21 3:19 PM   [place] MAIN [facility] SERVICES [address] [place], [place], [address] Phone: [phone]   Fax:  [phone]  Name: TYQUAN CARMICKLE MRN: [id number] Date of Birth: 1940-10-05

## 2021-05-31 ENCOUNTER — Other Ambulatory Visit: Payer: Self-pay

## 2021-05-31 ENCOUNTER — Ambulatory Visit: Payer: PPO | Admitting: Occupational Therapy

## 2021-05-31 DIAGNOSIS — I890 Lymphedema, not elsewhere classified: Secondary | ICD-10-CM

## 2021-05-31 NOTE — Therapy (Signed)
[place] MAIN [facility] SERVICES [address] [place], [place], [address] Phone: [phone]   Fax:  [phone]  Occupational Therapy Treatment  Bradley Details  Name: Miguel Bradley MRN: [id number] Date of Birth: 09-Jul-1940 Referring Provider (OT): Cammie Sickle, MD   Encounter Date: 05/31/2021   OT End of Session - 05/31/21 1308     Visit Number 8   Number of Visits 36    Date for OT Re-Evaluation 08/08/21    OT Start Time 0100    OT Stop Time 0200    OT Time Calculation (min) 60 min    Activity Tolerance Bradley tolerated treatment well;No increased pain    Behavior During Therapy [facility] for tasks assessed/performed             Past Medical History:  Diagnosis Date   Anemia    Basal cell carcinoma of face    Cataract    Diabetes mellitus without complication ([place])    Erectile dysfunction 06/11/2014   Glaucoma    Hypercalcemia    Hyperparathyroidism, unspecified ([place])    Impotence of organic origin    Bradley testicular hypofunction    Pure hyperglyceridemia    Special screening for malignant neoplasm of prostate    Special screening for malignant neoplasms, colon    Unspecified essential hypertension     Past Surgical History:  Procedure Laterality Date   COLONOSCOPY  06/13/2018   Dr. Lucio Edward   LEFT HEART CATH AND CORONARY ANGIOGRAPHY Left 03/16/2020   Procedure: LEFT HEART CATH AND CORONARY ANGIOGRAPHY;  Surgeon: Nelva Bush, MD;  Location: [place] [facility];  Service: Cardiovascular;  Laterality: Left;   PARATHYROIDECTOMY  2012   skin cancer removal     Dr. Sharlett Iles   TRIGGER FINGER RELEASE Left 2008    There were no vitals filed for this visit.   Subjective Assessment - 05/31/21 1552     Subjective  Miguel Bradley presents for OT Rx visit 8/36 to address L head and neck lymphedema 2/2 surgical intervention and XRT for salivary duct carcinoma. Miguel Bradley denies pain in head and neck today. He tells me  he is doing ther ex daily as direvcted.    Pertinent History S/p 07/13/10 RIGHT PARATHYROIDECTOMY, 08/24/20 L TOTAL PAROTIDECTOMY ; Hx Parotid gland Ca, LN levels 1-5: metastatic carcinoma 11/26 LN. Lateral temporal bone resection and reconstruction with L anterolateral thigh flap. 11/24/20 Completed adjuvant XRT at [facility], 02/03/21- present Began Eligard for androgen deprovation therapy. DM, Diabetic neuropathy, CAD, Basal Cell Carcinoma , face, HTN, Renal insufficiency, ICAO-Left, mild aortic stenosis, glaucoma    Limitations weakness and paralysis of facial muscles-L , xerostomia, taste dysfunction, head forwared posture, neck tightness w/ decreased AROM, chronic head / neck swelling    Repetition Increases Symptoms    Special Tests Intake FOTO: 98/100    Pain Onset More than a month ago   s/p cancer surgery and XRT                         OT Treatments/Exercises (OP) - 05/31/21 1553       ADLs   ADL Education Given Yes      Manual Therapy   Manual Therapy Edema management;Manual Lymphatic Drainage (MLD);Taping    Edema Management Kinesiotape: Two, 2" wide x ~ 8" long 4-finger fan cuts anchored  distal to L clavical and extending accross submental region , terminating at pre and post auricular region.  Manual Lymphatic Drainage (MLD) MLD to left head and neck avoiding deep strokes to carotid region in keeping with precautions for this Bradley. Directed lymph congestion using  proximal to distal method  working segmentally back wards from L axila  to chest, then from clavicular region, then from chin, submental and jawline towards clavicle,  then stimulating pre and post auricular nodes,  and finally using light J strokes accross face towards ears.. Unfortunately we did not have  time to perform fibrosis techniques or scar massage, but was able to demonstrate and review "marching soldiers" for Miguel Bradley and Miguel Bradley.                    OT Education - 05/31/21 1556     Education  Details Continued Miguel Bradley/ CG edu for lymphedema self care  and home program throughout session. Topics include compression options, simple self-MLD, therapeutic lymphatic pumping exercises, skin/nail care, risk reduction factors and LE precautions. Emphasis on Miguel Bradley and Miguel Bradley training for lymphatic map of head and neck and simplified sequencing for self MLD using axillary watershed. All questions answered to the Miguel Bradley's satisfaction, and Miguel Bradley demonstrates understanding by report.    Miguel Bradley    Methods Explanation;Demonstration;Handout    Comprehension Verbalized understanding;Returned demonstration                 OT Long Term Goals - 05/11/21 1239       OT LONG TERM GOAL #1   Title Miguel Bradley will be modified independent with all lymphedema home program companents using printed reference for rference, including simple self-MLD, therapeutic pumping exercises, stretching and AROM ther ex, skin care  and compression PRN.    Baseline Max A    Time 6    Period Days    Status New    Target Date --   6th OT Rx visit     OT LONG TERM GOAL #2   Title Given this patients Intake score of TBA/100 on the functional outcomes FOTO tool, Bradley will experience an increase in function of 3 points to improve basic and instrumental ADLs performance, including lymphedema self-care.    Baseline TBA Rx visit 1    Time 12    Period Weeks    Status New    Target Date 08/08/21      OT LONG TERM GOAL #3   Title Miguel Bradley will demonstrate a 5% girth reduction at upper and lower neck landmarks and at circumferential measurement of head (under chin) to limit LE progression and to regain maximum AROM .    Baseline MaX A    Time 12    Period Weeks    Status New      OT LONG TERM GOAL #4   Title Miguel Bradley will be independent with head and upper body postural corrections by DC to maximise breathing and limit negative impact on balance.    Baseline Max A    Time 12    Period Weeks    Status New    Target Date  08/08/21      OT LONG TERM GOAL #5   Title Miguel Bradley will demonstrate knowlwdge and understanding of lymphedema precautions and prevention strategies by discussing 5 precautions/ strategies using a printed reference ( modified independent)  to imit progression of chronic lymphedema.    Baseline Max A    Time 6    Period Days    Status New    Target Date --   6th LE Rx visit  Plan - 05/31/21 1546     Clinical Impression Statement Miguel Bradley reports he is doing lymphatic pumping and stretching ther ex at home as directed at least once, sometimes 2 x daily . Miguel Bradley tolerated MLD to L head and neck today without increased pain. Miguel Bradley educated with drawing as to typical lymphatic flow  of face head and neck to axilla. Utilized two, 4 finger fan cuts 2" wide kinesio tape anchored distal to L clavical and extending accross scar and submental region to L preauricular and post auricular region. Miguel Bradley instructed on proper removal technique to avoid skin irritation .Miguel Bradley will leave in place for 2-3 days if well tolerated. We're hoping passive skin massage in direction of clavicular and regional axillary LN will assist with releving submental swelling. Cont as per POC.    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 Edema;Skin integrity;Strength;Pain;Scar mobility;Sensation;Decreased knowledge of use of DME;Decreased knowledge of precautions    Rehab Potential Good    Clinical Decision Making Several treatment options, min-mod task modification necessary    Comorbidities Affecting Occupational Performance: Presence of comorbidities impacting occupational performance    Modification or Assistance to Complete  Evaluation  Min-Moderate modification of tasks or assist with assess necessary to complete eval    OT Frequency 1x / week    OT Duration 6 weeks   1 x weekly for 6 weeks initially w emphasis on teaching LE self care, including simple self MLD, scar massage, terapeutic exercise, skin care and compression. Will monitor circumferential measurements of head and neck for reductions, and extend Rx PRN   OT Treatment/Interventions Self-care/ADL training;Therapeutic exercise;Manual Therapy;Manual lymph drainage;Therapeutic activities;DME and/or AE instruction;Bradley/family education;Bradley (comment)   Consider HOS compression garment aka facioplast garment for submental area   Consulted and Agree with Plan of Care Bradley             Bradley will benefit from skilled therapeutic intervention in order to improve the following deficits and impairments:   Body Structure / Function / Physical Skills: Edema, Skin integrity, Strength, Pain, Scar mobility, Sensation, Decreased knowledge of use of DME, Decreased knowledge of precautions       Visit Diagnosis: Lymphedema, not elsewhere classified    Problem List Bradley Active Problem List   Diagnosis Date Noted   LLQ pain 02/22/2021   Decreased appetite 01/07/2021   Cancer of parotid gland ([place]) 09/11/2020   Mass of left side of neck 08/22/2020   Constipation 07/09/2020   Abnormal cardiac CT angiography 03/16/2020   Coronary artery disease involving native coronary artery of native heart without angina pectoris 03/11/2020   Mild aortic stenosis 03/11/2020   Decreased mobility and endurance 12/30/2019   ICAO (internal carotid artery occlusion), left 07/01/2019   Seasonal allergic rhinitis due to pollen 09/24/2018   Diabetic neuropathy ([place]) 06/21/2017   Class 1 obesity with serious comorbidity and body mass index (BMI) of 32.0 to 32.9 in adult 06/21/2017   Diabetic retinopathy associated with type 2 diabetes mellitus ([place]) 03/12/2015    Counseling regarding advanced care planning and goals of care 12/08/2014   Diabetes mellitus with ophthalmic complication ([place]) 69/62/9528   Renal insufficiency 11/25/2010   Testicular hypofunction 11/17/2009   Hyperlipidemia associated with type 2 diabetes mellitus ([place]) 07/05/2007   CARCINOMA,  BASAL CELL, FACE 07/03/2007   Hypertension associated with diabetes ([place]) 07/03/2007    Andrey Spearman, MS, OTR/L, [facility] 05/31/21 3:57 PM    [place] MAIN [facility] SERVICES [address] [place], [place], [address] Phone: [phone]   Fax:  [phone]  Name: OSMAR HOWTON MRN: [id number] Date of Birth: 11-09-1940

## 2021-06-01 DIAGNOSIS — C089 Malignant neoplasm of major salivary gland, unspecified: Secondary | ICD-10-CM | POA: Diagnosis not present

## 2021-06-01 DIAGNOSIS — C760 Malignant neoplasm of head, face and neck: Secondary | ICD-10-CM | POA: Diagnosis not present

## 2021-06-01 DIAGNOSIS — G510 Bell's palsy: Secondary | ICD-10-CM | POA: Diagnosis not present

## 2021-06-15 ENCOUNTER — Ambulatory Visit: Payer: PPO | Admitting: Occupational Therapy

## 2021-06-20 ENCOUNTER — Other Ambulatory Visit: Payer: Self-pay

## 2021-06-20 ENCOUNTER — Ambulatory Visit: Payer: PPO | Attending: Internal Medicine | Admitting: Occupational Therapy

## 2021-06-20 DIAGNOSIS — I890 Lymphedema, not elsewhere classified: Secondary | ICD-10-CM | POA: Insufficient documentation

## 2021-06-20 NOTE — Therapy (Signed)
[place] MAIN [facility] SERVICES [address] [place], [place], [address] Phone: [phone]   Fax:  [phone]  Occupational Therapy Treatment  Patient Details  Name: Miguel Bradley MRN: [id number] Date of Birth: 21-Mar-1941 Referring Provider (OT): Cammie Sickle, MD   Encounter Date: 06/20/2021   OT End of Session - 06/20/21 1113     Visit Number 6    Number of Visits 36    OT Start Time 1100    OT Stop Time 1200    OT Time Calculation (min) 60 min    Activity Tolerance Patient tolerated treatment well;No increased pain    Behavior During Therapy [facility] for tasks assessed/performed             Past Medical History:  Diagnosis Date   Anemia    Basal cell carcinoma of face    Cataract    Diabetes mellitus without complication ([place])    Erectile dysfunction 06/11/2014   Glaucoma    Hypercalcemia    Hyperparathyroidism, unspecified ([place])    Impotence of organic origin    Other testicular hypofunction    Pure hyperglyceridemia    Special screening for malignant neoplasm of prostate    Special screening for malignant neoplasms, colon    Unspecified essential hypertension     Past Surgical History:  Procedure Laterality Date   COLONOSCOPY  06/13/2018   Dr. Lucio Edward   LEFT HEART CATH AND CORONARY ANGIOGRAPHY Left 03/16/2020   Procedure: LEFT HEART CATH AND CORONARY ANGIOGRAPHY;  Surgeon: Nelva Bush, MD;  Location: [place];  Service: Cardiovascular;  Laterality: Left;   PARATHYROIDECTOMY  2012   skin cancer removal     Dr. Sharlett Iles   TRIGGER FINGER RELEASE Left 2008    There were no vitals filed for this visit.   Subjective Assessment - 06/20/21 1246     Subjective  Miguel Miguel Bradley presents for OT Rx visit 6/36 to address L head and neck lymphedema 2/2 surgical intervention and XRT for salivary duct carcinoma. Miguel Bradley was last seen on 05/31/21. He tells me that he does lymphatic pumping ther ex daily as  recommended throughout long holiday break. He tells me he he saw his dentist recently and he has a tooth extraction pending. We may consider increasing Rx frequency for a week or 2 during that time to limit LE exacerbation. Pt tolerated MLD with fibrosis techniques to submental and submandibular regions without difficulty . Minimum paplapble change in tissue density in neck, but slight reduction in fluid volume palpated today. Cont as per POC.    Pertinent History S/p 07/13/10 RIGHT PARATHYROIDECTOMY, 08/24/20 L TOTAL PAROTIDECTOMY ; Hx Parotid gland Ca, LN levels 1-5: metastatic carcinoma 11/26 LN. Lateral temporal bone resection and reconstruction with L anterolateral thigh flap. 11/24/20 Completed adjuvant XRT at [facility], 02/03/21- present Began Eligard for androgen deprovation therapy. DM, Diabetic neuropathy, CAD, Basal Cell Carcinoma , face, HTN, Renal insufficiency, ICAO-Left, mild aortic stenosis, glaucoma    Limitations weakness and paralysis of facial muscles-L , xerostomia, taste dysfunction, head forwared posture, neck tightness w/ decreased AROM, chronic head / neck swelling    Repetition Increases Symptoms    Special Tests Intake FOTO: 98/100    Pain Onset More than a month ago   s/p cancer surgery and XRT                         OT Treatments/Exercises (OP) - 06/20/21 1254  ADLs   ADL Education Given Yes      Manual Therapy   Manual Therapy Edema management;Manual Lymphatic Drainage (MLD);Taping    Manual Lymphatic Drainage (MLD) MLD to left head and neck avoiding deep strokes to carotid region in keeping with precautions for this patient. Directed lymph congestion using  proximal to distal method  working segmentally back wards from L axila  to chest, then from clavicular region, then from chin, submental and jawline towards clavicle,  then stimulating pre and post auricular nodes,  and finally using light J strokes accross face towards ears.. Unfortunately we did not  have  time to perform fibrosis techniques or scar massage, but was able to demonstrate and review "marching soldiers" for Pt and friend.                    OT Education - 06/20/21 1255     Education Details Continued Pt/ CG edu for lymphedema self care  and home program throughout session. Topics include compression options, simple self-MLD, therapeutic lymphatic pumping exercises, skin/nail care, risk reduction factors and LE precautions. Emphasis on Pt and sig other training for lymphatic map of head and neck and simplified sequencing for self MLD using axillary watershed. All questions answered to the Pt's satisfaction, and Pt demonstrates understanding by report.    Person(s) Educated Patient    Methods Explanation;Demonstration;Handout    Comprehension Verbalized understanding;Returned demonstration                 OT Long Term Goals - 05/11/21 1239       OT LONG TERM GOAL #1   Title Pt will be modified independent with all lymphedema home program companents using printed reference for rference, including simple self-MLD, therapeutic pumping exercises, stretching and AROM ther ex, skin care  and compression PRN.    Baseline Max A    Time 6    Period Days    Status New    Target Date --   6th OT Rx visit     OT LONG TERM GOAL #2   Title Given this patients Intake score of TBA/100 on the functional outcomes FOTO tool, patient will experience an increase in function of 3 points to improve basic and instrumental ADLs performance, including lymphedema self-care.    Baseline TBA Rx visit 1    Time 12    Period Weeks    Status New    Target Date 08/08/21      OT LONG TERM GOAL #3   Title Pt will demonstrate a 5% girth reduction at upper and lower neck landmarks and at circumferential measurement of head (under chin) to limit LE progression and to regain maximum AROM .    Baseline MaX A    Time 12    Period Weeks    Status New      OT LONG TERM GOAL #4   Title  Pt will be independent with head and upper body postural corrections by DC to maximise breathing and limit negative impact on balance.    Baseline Max A    Time 12    Period Weeks    Status New    Target Date 08/08/21      OT LONG TERM GOAL #5   Title Pt will demonstrate knowlwdge and understanding of lymphedema precautions and prevention strategies by discussing 5 precautions/ strategies using a printed reference ( modified independent)  to imit progression of chronic lymphedema.    Baseline Max A  Time 6    Period Days    Status New    Target Date --   6th LE Rx visit                   Patient will benefit from skilled therapeutic intervention in order to improve the following deficits and impairments:           Visit Diagnosis: Lymphedema, not elsewhere classified    Problem List Patient Active Problem List   Diagnosis Date Noted   LLQ pain 02/22/2021   Decreased appetite 01/07/2021   Cancer of parotid gland ([place]) 09/11/2020   Mass of left side of neck 08/22/2020   Constipation 07/09/2020   Abnormal cardiac CT angiography 03/16/2020   Coronary artery disease involving native coronary artery of native heart without angina pectoris 03/11/2020   Mild aortic stenosis 03/11/2020   Decreased mobility and endurance 12/30/2019   ICAO (internal carotid artery occlusion), left 07/01/2019   Seasonal allergic rhinitis due to pollen 09/24/2018   Diabetic neuropathy ([place]) 06/21/2017   Class 1 obesity with serious comorbidity and body mass index (BMI) of 32.0 to 32.9 in adult 06/21/2017   Diabetic retinopathy associated with type 2 diabetes mellitus ([place]) 03/12/2015   Counseling regarding advanced care planning and goals of care 12/08/2014   Diabetes mellitus with ophthalmic complication ([place]) 25/42/7062   Renal insufficiency 11/25/2010   Testicular hypofunction 11/17/2009   Hyperlipidemia associated with type 2 diabetes mellitus ([place]) 07/05/2007   CARCINOMA, BASAL  CELL, FACE 07/03/2007   Hypertension associated with diabetes ([place]) 07/03/2007    Miguel Spearman, MS, OTR/L, [facility] 06/20/21 12:56 PM    [place] MAIN [facility] SERVICES [place], [place], [address] Phone: [phone]   Fax:  [phone]  Name: BRANTON EINSTEIN MRN: [id number] Date of Birth: 11/30/1940

## 2021-06-21 ENCOUNTER — Ambulatory Visit: Payer: PPO | Admitting: Family Medicine

## 2021-06-22 DIAGNOSIS — H40153 Residual stage of open-angle glaucoma, bilateral: Secondary | ICD-10-CM | POA: Diagnosis not present

## 2021-06-23 DIAGNOSIS — G510 Bell's palsy: Secondary | ICD-10-CM | POA: Diagnosis not present

## 2021-06-23 DIAGNOSIS — C07 Malignant neoplasm of parotid gland: Secondary | ICD-10-CM | POA: Diagnosis not present

## 2021-06-27 ENCOUNTER — Ambulatory Visit: Payer: PPO | Admitting: Occupational Therapy

## 2021-07-04 ENCOUNTER — Ambulatory Visit: Payer: PPO | Admitting: Occupational Therapy

## 2021-07-04 ENCOUNTER — Other Ambulatory Visit: Payer: Self-pay

## 2021-07-04 DIAGNOSIS — I890 Lymphedema, not elsewhere classified: Secondary | ICD-10-CM

## 2021-07-04 NOTE — Therapy (Signed)
[place] MAIN [facility] SERVICES [address] [place], [place], [address] Phone: [phone]   Fax:  [phone]  Occupational Therapy Treatment  Patient Details  Name: Miguel Bradley MRN: [id number] Date of Birth: Jun 27, 1940 Referring Provider (OT): Cammie Sickle, MD   Encounter Date: 07/04/2021   OT End of Session - 07/04/21 1108     Visit Number 7    Number of Visits 36    Date for OT Re-Evaluation 08/08/21    OT Start Time 1100    OT Stop Time 1200    OT Time Calculation (min) 60 min    Activity Tolerance Patient tolerated treatment well;No increased pain    Behavior During Therapy [facility] for tasks assessed/performed             Past Medical History:  Diagnosis Date   Anemia    Basal cell carcinoma of face    Cataract    Diabetes mellitus without complication ([place])    Erectile dysfunction 06/11/2014   Glaucoma    Hypercalcemia    Hyperparathyroidism, unspecified ([place])    Impotence of organic origin    Other testicular hypofunction    Pure hyperglyceridemia    Special screening for malignant neoplasm of prostate    Special screening for malignant neoplasms, colon    Unspecified essential hypertension     Past Surgical History:  Procedure Laterality Date   COLONOSCOPY  06/13/2018   Dr. Lucio Edward   LEFT HEART CATH AND CORONARY ANGIOGRAPHY Left 03/16/2020   Procedure: LEFT HEART CATH AND CORONARY ANGIOGRAPHY;  Surgeon: Nelva Bush, MD;  Location: [place] [facility];  Service: Cardiovascular;  Laterality: Left;   PARATHYROIDECTOMY  2012   skin cancer removal     Dr. Sharlett Iles   TRIGGER FINGER RELEASE Left 2008    There were no vitals filed for this visit.   Subjective Assessment - 07/04/21 1232     Subjective  [name] presents for OT Rx visit 7/36 to address L head and neck lymphedema 2/2 surgical intervention and XRT for salivary duct carcinoma. [name] was last seen on  1/9. He missed one scheduled  visit due to scheduling conflict and another due to OT% illness.  He tells me that he continues to do lymphatic pumping ther ex daily. He gfeels like he is doing very well.  He tells me he he saw his dentist recently and he has a tooth extraction pending. We may consider increasing Rx frequency for a week or 2 during that time to limit LE exacerbation. Pt tolerated MLD with fibrosis techniques to submental and submandibular regions without difficulty . Minimum paplapble change in tissue density in neck, but slight reduction in fluid volume palpated today. Cont as per POC.    Pertinent History S/p 07/13/10 RIGHT PARATHYROIDECTOMY, 08/24/20 L TOTAL PAROTIDECTOMY ; Hx Parotid gland Ca, LN levels 1-5: metastatic carcinoma 11/26 LN. Lateral temporal bone resection and reconstruction with L anterolateral thigh flap. 11/24/20 Completed adjuvant XRT at [facility], 02/03/21- present Began Eligard for androgen deprovation therapy. DM, Diabetic neuropathy, CAD, Basal Cell Carcinoma , face, HTN, Renal insufficiency, ICAO-Left, mild aortic stenosis, glaucoma    Limitations weakness and paralysis of facial muscles-L , xerostomia, taste dysfunction, head forwared posture, neck tightness w/ decreased AROM, chronic head / neck swelling    Repetition Increases Symptoms    Special Tests Intake FOTO: 98/100    Pain Onset More than a month ago   s/p cancer surgery and XRT  OT Treatments/Exercises (OP) - 07/04/21 1504       ADLs   ADL Education Given Yes      Manual Therapy   Manual Therapy Edema management;Manual Lymphatic Drainage (MLD);Taping    Edema Management Kinesiotape: Two, 2" wide x ~ 8" long 4-finger fan cuts anchored  distal to L clavical and extending accross submental region , terminating at pre and post auricular region.    Manual Lymphatic Drainage (MLD) MLD to left head and neck avoiding deep strokes to carotid region in keeping with precautions for this patient. Directed  lymph congestion using  proximal to distal method  working segmentally back wards from L axila  to chest, then from clavicular region, then from chin, submental and jawline towards clavicle,  then stimulating pre and post auricular nodes,  and finally using light J strokes accross face towards ears.. Unfortunately we did not have  time to perform fibrosis techniques or scar massage, but was able to demonstrate and review "marching soldiers" for Pt and friend.                    OT Education - 07/04/21 1505     Education Details Continued Pt/ CG edu for lymphedema self care  and home program throughout session. Topics include compression options, simple self-MLD, therapeutic lymphatic pumping exercises, skin/nail care, risk reduction factors and LE precautions. Emphasis on Pt and sig other training for lymphatic map of head and neck and simplified sequencing for self MLD using axillary watershed. All questions answered to the Pt's satisfaction, and Pt demonstrates understanding by report.    Person(s) Educated Patient    Methods Explanation;Demonstration;Handout    Comprehension Verbalized understanding;Returned demonstration                 OT Long Term Goals - 05/11/21 1239       OT LONG TERM GOAL #1   Title Pt will be modified independent with all lymphedema home program companents using printed reference for rference, including simple self-MLD, therapeutic pumping exercises, stretching and AROM ther ex, skin care  and compression PRN.    Baseline Max A    Time 6    Period Days    Status New    Target Date --   6th OT Rx visit     OT LONG TERM GOAL #2   Title Given this patients Intake score of TBA/100 on the functional outcomes FOTO tool, patient will experience an increase in function of 3 points to improve basic and instrumental ADLs performance, including lymphedema self-care.    Baseline TBA Rx visit 1    Time 12    Period Weeks    Status New    Target Date  08/08/21      OT LONG TERM GOAL #3   Title Pt will demonstrate a 5% girth reduction at upper and lower neck landmarks and at circumferential measurement of head (under chin) to limit LE progression and to regain maximum AROM .    Baseline MaX A    Time 12    Period Weeks    Status New      OT LONG TERM GOAL #4   Title Pt will be independent with head and upper body postural corrections by DC to maximise breathing and limit negative impact on balance.    Baseline Max A    Time 12    Period Weeks    Status New    Target Date 08/08/21  OT LONG TERM GOAL #5   Title Pt will demonstrate knowlwdge and understanding of lymphedema precautions and prevention strategies by discussing 5 precautions/ strategies using a printed reference ( modified independent)  to imit progression of chronic lymphedema.    Baseline Max A    Time 6    Period Days    Status New    Target Date --   6th LE Rx visit                  Plan - 07/04/21 1207     Clinical Impression Statement Pt last seen 05/31/21.He missed last 2 scheduled sessions due to scheduling conflict and OT illness.  Mr Zuckerman reports he continues to perform daily lymphatic pumping and stretching ther ex at home as directed. We continued with MLD to L head and neck today towards L axilla and L clavicular LN. After manual therapy skin was cleaned with mild soap and water Utilized two, 3 finger fan cuts 2" wide kinesio tape were applied with one strip anchored slightly L of midline at clavical and 2nd strip anchored lateral to that. Fan cuts axtended over submental area and left neck to jaw line,Pt agrees to leave in place leave for 2-3 days if well tolerated. We're hoping passive skin massage in direction of clavicular and regional axillary LN will assist with releving submental swelling. Cont OT 1 x weekly thru Feb as per POC.    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 Edema;Skin integrity;Strength;Pain;Scar mobility;Sensation;Decreased knowledge of use of DME;Decreased knowledge of precautions    Rehab Potential Good    Clinical Decision Making Several treatment options, min-mod task modification necessary    Comorbidities Affecting Occupational Performance: Presence of comorbidities impacting occupational performance    Modification or Assistance to Complete Evaluation  Min-Moderate modification of tasks or assist with assess necessary to complete eval    OT Frequency 1x / week    OT Duration 6 weeks   1 x weekly for 6 weeks initially w emphasis on teaching LE self care, including simple self MLD, scar massage, terapeutic exercise, skin care and compression. Will monitor circumferential measurements of head and neck for reductions, and extend Rx PRN   OT Treatment/Interventions Self-care/ADL training;Therapeutic exercise;Manual Therapy;Manual lymph drainage;Therapeutic activities;DME and/or AE instruction;Patient/family education;Other (comment)   Consider HOS compression garment aka facioplast garment for submental area   Consulted and Agree with Plan of Care Patient             Patient will benefit from skilled therapeutic intervention in order to improve the following deficits and impairments:   Body Structure / Function / Physical Skills: Edema, Skin integrity, Strength, Pain, Scar mobility, Sensation, Decreased knowledge of use of DME, Decreased knowledge of precautions       Visit Diagnosis: Lymphedema, not elsewhere classified    Problem List Patient Active Problem List   Diagnosis Date Noted   LLQ pain 02/22/2021   Decreased appetite 01/07/2021   Cancer of parotid gland ([place]) 09/11/2020   Mass  of left side of neck 08/22/2020   Constipation 07/09/2020   Abnormal cardiac CT angiography 03/16/2020   Coronary artery disease involving native coronary artery of native heart without angina pectoris 03/11/2020   Mild aortic stenosis  03/11/2020   Decreased mobility and endurance 12/30/2019   ICAO (internal carotid artery occlusion), left 07/01/2019   Seasonal allergic rhinitis due to pollen 09/24/2018   Diabetic neuropathy ([place]) 06/21/2017   Class 1 obesity with serious comorbidity and body mass index (BMI) of 32.0 to 32.9 in adult 06/21/2017   Diabetic retinopathy associated with type 2 diabetes mellitus ([place]) 03/12/2015   Counseling regarding advanced care planning and goals of care 12/08/2014   Diabetes mellitus with ophthalmic complication ([place]) 21/19/4174   Renal insufficiency 11/25/2010   Testicular hypofunction 11/17/2009   Hyperlipidemia associated with type 2 diabetes mellitus ([place]) 07/05/2007   CARCINOMA, BASAL CELL, FACE 07/03/2007   Hypertension associated with diabetes ([place]) 07/03/2007    Andrey Spearman, MS, OTR/L, CLT-LANA 07/04/21 3:05 PM   [place] MAIN [facility] SERVICES [place], [place], [address] Phone: [phone]   Fax:  [phone]  Name: Miguel Bradley MRN: [id number] Date of Birth: 1941-01-27

## 2021-07-08 DIAGNOSIS — H903 Sensorineural hearing loss, bilateral: Secondary | ICD-10-CM | POA: Diagnosis not present

## 2021-07-11 ENCOUNTER — Ambulatory Visit: Payer: PPO | Admitting: Occupational Therapy

## 2021-07-11 ENCOUNTER — Other Ambulatory Visit: Payer: Self-pay

## 2021-07-11 DIAGNOSIS — I890 Lymphedema, not elsewhere classified: Secondary | ICD-10-CM

## 2021-07-11 NOTE — Therapy (Signed)
[place] MAIN [facility] SERVICES [address] [place], [place], [address] Phone: [phone]   Fax:  [phone]  Occupational Therapy Treatment  Patient Details  Name: Miguel Bradley MRN: [id number] Date of Birth: 16-Apr-1941 Referring Provider (OT): Cammie Sickle, MD   Encounter Date: 07/11/2021   OT End of Session - 07/11/21 1305     Visit Number 9    Number of Visits 36    Date for OT Re-Evaluation 08/08/21    OT Start Time 1105    OT Stop Time 1205    OT Time Calculation (min) 60 min    Activity Tolerance Patient tolerated treatment well;No increased pain    Behavior During Therapy [facility]  for tasks assessed/performed             Past Medical History:  Diagnosis Date   Anemia    Basal cell carcinoma of face    Cataract    Diabetes mellitus without complication ([place])    Erectile dysfunction 06/11/2014   Glaucoma    Hypercalcemia    Hyperparathyroidism, unspecified ([place])    Impotence of organic origin    Other testicular hypofunction    Pure hyperglyceridemia    Special screening for malignant neoplasm of prostate    Special screening for malignant neoplasms, colon    Unspecified essential hypertension     Past Surgical History:  Procedure Laterality Date   COLONOSCOPY  06/13/2018   Dr. Lucio Edward   LEFT HEART CATH AND CORONARY ANGIOGRAPHY Left 03/16/2020   Procedure: LEFT HEART CATH AND CORONARY ANGIOGRAPHY;  Surgeon: Nelva Bush, MD;  Location: [place] [facility];  Service: Cardiovascular;  Laterality: Left;   PARATHYROIDECTOMY  2012   skin cancer removal     Dr. Sharlett Iles   TRIGGER FINGER RELEASE Left 2008    There were no vitals filed for this visit.   Subjective Assessment - 07/11/21 1311     Subjective  Miguel Bradley presents for OT Rx visit 8/36 to address L head and neck lymphedema 2/2 surgical intervention and XRT for salivary duct carcinoma. Miguel Bradley tells me he continues to perform ther ex 2 x  daily. He denies LR related pain today. He trells me that his friend thinks his neck feels softer.    Pertinent History S/p 07/13/10 RIGHT PARATHYROIDECTOMY, 08/24/20 L TOTAL PAROTIDECTOMY ; Hx Parotid gland Ca, LN levels 1-5: metastatic carcinoma 11/26 LN. Lateral temporal bone resection and reconstruction with L anterolateral thigh flap. 11/24/20 Completed adjuvant XRT at [facility], 02/03/21- present Began Eligard for androgen deprovation therapy. DM, Diabetic neuropathy, CAD, Basal Cell Carcinoma , face, HTN, Renal insufficiency, ICAO-Left, mild aortic stenosis, glaucoma    Limitations weakness and paralysis of facial muscles-L , xerostomia, taste dysfunction, head forwared posture, neck tightness w/ decreased AROM, chronic head / neck swelling    Repetition Increases Symptoms    Special Tests Intake FOTO: 98/100    Pain Onset More than a month ago   s/p cancer surgery and XRT                                      OT Long Term Goals - 05/11/21 1239       OT LONG TERM GOAL #1   Title Pt will be modified independent with all lymphedema home program companents using printed reference for rference, including simple self-MLD, therapeutic pumping exercises, stretching and AROM ther ex,  skin care  and compression PRN.    Baseline Max A    Time 6    Period Days    Status New    Target Date --   6th OT Rx visit     OT LONG TERM GOAL #2   Title Given this patients Intake score of TBA/100 on the functional outcomes FOTO tool, patient will experience an increase in function of 3 points to improve basic and instrumental ADLs performance, including lymphedema self-care.    Baseline TBA Rx visit 1    Time 12    Period Weeks    Status New    Target Date 08/08/21      OT LONG TERM GOAL #3   Title Pt will demonstrate a 5% girth reduction at upper and lower neck landmarks and at circumferential measurement of head (under chin) to limit LE progression and to regain maximum AROM .     Baseline MaX A    Time 12    Period Weeks    Status New      OT LONG TERM GOAL #4   Title Pt will be independent with head and upper body postural corrections by DC to maximise breathing and limit negative impact on balance.    Baseline Max A    Time 12    Period Weeks    Status New    Target Date 08/08/21      OT LONG TERM GOAL #5   Title Pt will demonstrate knowlwdge and understanding of lymphedema precautions and prevention strategies by discussing 5 precautions/ strategies using a printed reference ( modified independent)  to imit progression of chronic lymphedema.    Baseline Max A    Time 6    Period Days    Status New    Target Date --   6th LE Rx visit                  Plan - 07/11/21 1306     Clinical Impression Statement By gross assessment Pt has increased nevck AROM in all planes. We did not measure formally but will at upcoming progress assessment (visit 10). Provided skilled teaching for simple self-MLD for head and neck throughout much of session. Pt taught anatomy of lymphatics in this region and typical drainage pattern to axillary LN. Used demonstration and drawings, verbal and physical cues. By end of session Pt able to perform J stroke using correct techniques.With additional practice he will master the complete sequence. Cont MLD to LUQ and L head and neck today        as established. Pt has increased skin flexibility at lateral neck and additionaol wrinkles at submental area, suggesting improfved decongestion. Cont as per POC.    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;Skin integrity;Strength;Pain;Scar mobility;Sensation;Decreased knowledge of use  of DME;Decreased knowledge of precautions    Rehab Potential Good    Clinical Decision Making Several treatment options, min-mod task modification necessary    Comorbidities Affecting Occupational Performance: Presence of comorbidities impacting occupational performance    Modification or Assistance to Complete Evaluation  Min-Moderate modification of tasks or assist with assess necessary to complete eval    OT Frequency 1x / week    OT Duration  6 weeks   1 x weekly for 6 weeks initially w emphasis on teaching LE self care, including simple self MLD, scar massage, terapeutic exercise, skin care and compression. Will monitor circumferential measurements of head and neck for reductions, and extend Rx PRN   OT Treatment/Interventions Self-care/ADL training;Therapeutic exercise;Manual Therapy;Manual lymph drainage;Therapeutic activities;DME and/or AE instruction;Patient/family education;Other (comment)   Consider HOS compression garment aka facioplast garment for submental area   Consulted and Agree with Plan of Care Patient             Patient will benefit from skilled therapeutic intervention in order to improve the following deficits and impairments:   Body Structure / Function / Physical Skills: Edema, Skin integrity, Strength, Pain, Scar mobility, Sensation, Decreased knowledge of use of DME, Decreased knowledge of precautions       Visit Diagnosis: Lymphedema, not elsewhere classified    Problem List Patient Active Problem List   Diagnosis Date Noted   LLQ pain 02/22/2021   Decreased appetite 01/07/2021   Cancer of parotid gland ([place]) 09/11/2020   Mass of left side of neck 08/22/2020   Constipation 07/09/2020   Abnormal cardiac CT angiography 03/16/2020   Coronary artery disease involving native coronary artery of native heart without angina pectoris 03/11/2020   Mild aortic stenosis 03/11/2020   Decreased mobility and endurance 12/30/2019   ICAO (internal carotid artery  occlusion), left 07/01/2019   Seasonal allergic rhinitis due to pollen 09/24/2018   Diabetic neuropathy ([place]) 06/21/2017   Class 1 obesity with serious comorbidity and body mass index (BMI) of 32.0 to 32.9 in adult 06/21/2017   Diabetic retinopathy associated with type 2 diabetes mellitus ([place]) 03/12/2015   Counseling regarding advanced care planning and goals of care 12/08/2014   Diabetes mellitus with ophthalmic complication ([place]) 16/03/9603   Renal insufficiency 11/25/2010   Testicular hypofunction 11/17/2009   Hyperlipidemia associated with type 2 diabetes mellitus ([place]) 07/05/2007   CARCINOMA, BASAL CELL, FACE 07/03/2007   Hypertension associated with diabetes ([place]) 07/03/2007    Andrey Spearman, MS, OTR/L, CLT-LANA 07/11/21 3:59 PM   [place] MAIN [facility] SERVICES [address] [place], [place], [address] Phone: [phone]   Fax:  [phone]  Name: Miguel Bradley MRN: [id number] Date of Birth: [birth year]

## 2021-07-16 ENCOUNTER — Other Ambulatory Visit: Payer: Self-pay | Admitting: Family Medicine

## 2021-07-18 ENCOUNTER — Ambulatory Visit: Payer: PPO | Attending: Internal Medicine | Admitting: Occupational Therapy

## 2021-07-18 ENCOUNTER — Other Ambulatory Visit: Payer: Self-pay

## 2021-07-18 DIAGNOSIS — I890 Lymphedema, not elsewhere classified: Secondary | ICD-10-CM | POA: Diagnosis not present

## 2021-07-18 NOTE — Therapy (Signed)
[place] MAIN [facility] SERVICES [address] [place], [place], [address] Phone: [phone]   Fax:  [phone]  Occupational Therapy Treatment Note and Progress Report: Lymphedema Care of Head and Neck  Patient Details  Name: Miguel Bradley MRN: [id number] Date of Birth: 07/14/1940 Referring Provider (OT): Cammie Sickle, MD   Encounter Date: 07/18/2021   OT End of Session - 07/18/21 1506     Visit Number 10    Number of Visits 36    Date for OT Re-Evaluation 08/08/21    OT Start Time 0300    OT Stop Time 0406    OT Time Calculation (min) 66 min    Activity Tolerance Patient tolerated treatment well;No increased pain    Behavior During Therapy [facility] for tasks assessed/performed             Past Medical History:  Diagnosis Date   Anemia    Basal cell carcinoma of face    Cataract    Diabetes mellitus without complication ([place])    Erectile dysfunction 06/11/2014   Glaucoma    Hypercalcemia    Hyperparathyroidism, unspecified ([place])    Impotence of organic origin    Other testicular hypofunction    Pure hyperglyceridemia    Special screening for malignant neoplasm of prostate    Special screening for malignant neoplasms, colon    Unspecified essential hypertension     Past Surgical History:  Procedure Laterality Date   COLONOSCOPY  06/13/2018   Dr. Lucio Edward   LEFT HEART CATH AND CORONARY ANGIOGRAPHY Left 03/16/2020   Procedure: LEFT HEART CATH AND CORONARY ANGIOGRAPHY;  Surgeon: Nelva Bush, MD;  Location: [facility];  Service: Cardiovascular;  Laterality: Left;   PARATHYROIDECTOMY  2012   skin cancer removal     Dr. Sharlett Iles   TRIGGER FINGER RELEASE Left 2008    There were no vitals filed for this visit.   Subjective Assessment - 07/18/21 1617     Subjective  Miguel Bradley presents for OT Rx visit 10/36 to address L head and neck lymphedema 2/2 surgical intervention and XRT for salivary duct  carcinoma. Miguel Bradley tells me he continues to perform ther ex 2 x daily. He denies LR related pain today.    Pertinent History S/p 07/13/10 RIGHT PARATHYROIDECTOMY, 08/24/20 L TOTAL PAROTIDECTOMY ; Hx Parotid gland Ca, LN levels 1-5: metastatic carcinoma 11/26 LN. Lateral temporal bone resection and reconstruction with L anterolateral thigh flap. 11/24/20 Completed adjuvant XRT at [facility], 02/03/21- present Began Eligard for androgen deprovation therapy. DM, Diabetic neuropathy, CAD, Basal Cell Carcinoma , face, HTN, Renal insufficiency, ICAO-Left, mild aortic stenosis, glaucoma    Limitations weakness and paralysis of facial muscles-L , xerostomia, taste dysfunction, head forwared posture, neck tightness w/ decreased AROM, chronic head / neck swelling    Repetition Increases Symptoms    Special Tests Intake FOTO: 98/100    Pain Onset More than a month ago   s/p cancer surgery and XRT                LYMPHEDEMA/ONCOLOGY QUESTIONNAIRE - 07/18/21 1618       Surgeries   Other Surgery Date --   08/24/20 L total parotidectomyw facial nerve sacrifice, temporal bonme resection, and reconstruction with L anterolateral thigh free flap\   Number Lymph Nodes Removed 0.42      Date Lymphedema/Swelling Started   Date 08/24/20      Treatment   Past Radiation Treatment Yes  Date 11/24/20    Current Hormone Treatment --   Eligard for androgen deprovation     What other symptoms do you have   Are you Having Heaviness or Tightness Yes    Are you having Pain Yes    Do you have infections No      Head and Neck   Other superior neck 38.6 cm    Other Inferior neck37.4 cm    Other Circ chin to apex of head = 69                     OT Treatments/Exercises (OP) - 07/18/21 1617       ADLs   ADL Education Given Yes      Manual Therapy   Manual Therapy Edema management;Manual Lymphatic Drainage (MLD);Taping    Manual Lymphatic Drainage (MLD) MLD to left head and neck avoiding deep strokes  to carotid region in keeping with precautions for this patient. Directed lymph congestion using  proximal to distal method  working segmentally back wards from L axila  to chest, then from clavicular region, then from chin, submental and jawline towards clavicle,  then stimulating pre and post auricular nodes,  and finally using light J strokes accross face towards ears.. Unfortunately we did not have  time to perform fibrosis techniques or scar massage, but was able to demonstrate and review "marching soldiers" for Miguel Bradley and friend.                    OT Education - 07/18/21 1620     Education Details Continued Miguel Bradley/ CG edu for lymphedema self care  and home program throughout session. Topics include compression options, simple self-MLD, therapeutic lymphatic pumping exercises, skin/nail care, risk reduction factors and LE precautions. Emphasis on Miguel Bradley and sig other training for lymphatic map of head and neck and simplified sequencing for self MLD using axillary watershed. All questions answered to the Miguel Bradley's satisfaction, and Miguel Bradley demonstrates understanding by report.    Person(s) Educated Patient    Methods Explanation;Demonstration;Handout    Comprehension Verbalized understanding;Returned demonstration                 OT Long Term Goals - 07/18/21 1615       OT LONG TERM GOAL #1   Title Miguel Bradley will be modified independent with all lymphedema home program companents using printed reference for rference, including simple self-MLD, therapeutic pumping exercises, stretching and AROM ther ex, skin care  and compression PRN.    Baseline Max A    Time 6    Period Days    Status Achieved    Target Date --   6th OT Rx visit     OT LONG TERM GOAL #2   Title Given this patients Intake score of TBA/100 on the functional outcomes FOTO tool, patient will experience an increase in function of 3 points to improve basic and instrumental ADLs performance, including lymphedema self-care.    Baseline  TBA Rx visit 1    Time 12    Period Weeks    Status On-going    Target Date 10/16/21      OT LONG TERM GOAL #3   Title Miguel Bradley will demonstrate a 5% girth reduction at upper and lower neck landmarks and at circumferential measurement of head (under chin) to limit LE progression and to regain maximum AROM .   10th visit 07/18/21: Partially met w/ 15.95% reduction in girth of lower neck since commencing OT  in Nov 2022.   Baseline MaX A    Time 12    Period Weeks    Status Partially Met    Target Date 10/16/21      OT LONG TERM GOAL #4   Title Miguel Bradley will be independent with head and upper body postural corrections by DC to maximise breathing and limit negative impact on balance.    Baseline Max A    Time 12    Period Weeks    Status Achieved    Target Date 08/08/21      OT LONG TERM GOAL #5   Title Miguel Bradley will demonstrate knowlwdge and understanding of lymphedema precautions and prevention strategies by discussing 5 precautions/ strategies using a printed reference ( modified independent)  to imit progression of chronic lymphedema.    Baseline Max A    Time 6    Period Days    Status Achieved    Target Date --   6th LE Rx visit                  Plan - 07/18/21 1608     Clinical Impression Statement Cpmpleted circumferential measurements of upper and lower neck and chin-apex of head to compare with initial measurements. Circ chin    -apex ogf head is decreased from initial circumference of 70 cm to 60 cm today. Circ of upper neck is unchanged from initial 38.6 cm. Notable change in circ of lower neck is measured today. Circ of this landmark is decreased 7 cm from 44.5 on 05/12/21 to 37.4 cm today. This is a 15.95% volume reduction, which meets 10% reduction goal for this landmark. Submental and "wattle" region remain full, but skin wrinkles  and losened skin are evidence of decongestion in this region. Skin  from chin to colarbone is substantially loser and more pliable with mild decrease in  grainy fibrosis. By gross assessment Miguel Bradley has increased nevck AROM in all planes. We did not measure formally but will at upcoming progress assessment (visit 10). Provided skilled teaching for simple self-MLD for head and neck throughout much of session. Cont MLD to LUQ and L head and neck today as established. Miguel Bradley has increased skin flexibility at lateral neck and additionaol wrinkles at submental area, suggesting improfved decongestion. Cont as per POC.    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;Skin integrity;Strength;Pain;Scar mobility;Sensation;Decreased knowledge of use of DME;Decreased knowledge of precautions    Rehab Potential Good    Clinical Decision Making Several treatment options, min-mod task modification necessary    Comorbidities Affecting Occupational Performance: Presence of comorbidities impacting occupational performance    Modification or Assistance to Complete Evaluation  Min-Moderate modification of tasks or assist with assess necessary to complete eval    OT Frequency 1x / week    OT Duration 6 weeks   1 x weekly for 6 weeks initially w emphasis on teaching LE self care, including simple self MLD, scar massage, terapeutic exercise, skin care and compression. Will monitor circumferential measurements of head and neck for reductions, and extend Rx PRN   OT Treatment/Interventions Self-care/ADL training;Therapeutic exercise;Manual Therapy;Manual lymph drainage;Therapeutic activities;DME and/or AE instruction;Patient/family education;Other (comment)   Consider HOS compression garment aka facioplast garment for submental area   Consulted  and Agree with Plan of Care Patient              Patient will benefit from skilled therapeutic intervention in order to improve the following deficits and impairments:   Body Structure / Function / Physical Skills: Edema, Skin integrity, Strength, Pain, Scar mobility, Sensation, Decreased knowledge of use of DME, Decreased knowledge of precautions       Visit Diagnosis: Lymphedema, not elsewhere classified    Problem List Patient Active Problem List   Diagnosis Date Noted   LLQ pain 02/22/2021   Decreased appetite 01/07/2021   Cancer of parotid gland ([place]) 09/11/2020   Mass of left side of neck 08/22/2020   Constipation 07/09/2020   Abnormal cardiac CT angiography 03/16/2020   Coronary artery disease involving native coronary artery of native heart without angina pectoris 03/11/2020   Mild aortic stenosis 03/11/2020   Decreased mobility and endurance 12/30/2019   ICAO (internal carotid artery occlusion), left 07/01/2019   Seasonal allergic rhinitis due to pollen 09/24/2018   Diabetic neuropathy ([place]) 06/21/2017   Class 1 obesity with serious comorbidity and body mass index (BMI) of 32.0 to 32.9 in adult 06/21/2017   Diabetic retinopathy associated with type 2 diabetes mellitus ([place]) 03/12/2015   Counseling regarding advanced care planning and goals of care 12/08/2014   Diabetes mellitus with ophthalmic complication ([place]) 01/58/6825   Renal insufficiency 11/25/2010   Testicular hypofunction 11/17/2009   Hyperlipidemia associated with type 2 diabetes mellitus ([place]) 07/05/2007   CARCINOMA, BASAL CELL, FACE 07/03/2007   Hypertension associated with diabetes ([place]) 07/03/2007    Miguel Spearman, MS, OTR/L, CLT-LANA 07/18/21 4:21 PM    [place] MAIN [facility] SERVICES [address] [place], [place], [address] Phone: [phone]   Fax:  [phone]  Name: GAYLAND NICOL MRN: [id number] Date of Birth: Apr 15, 1941

## 2021-07-19 ENCOUNTER — Telehealth: Payer: Self-pay | Admitting: *Deleted

## 2021-07-19 NOTE — Telephone Encounter (Signed)
Patient called reporting that he is habving intermittent dizziness since 07/17/21 and is asking who he should see about this. He states that he has an appointment with Dr End his cardiologist tomorrow and with Dr Jacinto Reap and [place] on the 20th. Please advise

## 2021-07-19 NOTE — Progress Notes (Signed)
Follow-up Outpatient Visit Date: 07/20/2021  Primary Care Provider: Jinny Sanders, MD [name] [address]  Chief Complaint: Follow-up coronary artery disease  HPI:  Miguel Bradley is a 81 y.o. male with history of severe single-vessel coronary artery disease (80-90% distal RCA stenosis), left carotid artery occlusion, hypertension, hyperlipidemia, type 2 diabetes mellitus, and parotid cancer, who presents for follow-up of coronary artery disease as well as assessment of recent dizziness.  He was last seen in 06/2020 by Marrianne Mood, PA, at which time he was feeling well.  No medication changes or additional testing were pursued.  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  Today, Miguel Bradley reports that he is doing fairly well from a heart standpoint.  He has not had any chest pain or shortness of breath.  He also denies palpitations and edema.  He feels unstable on his feet and describes some difficulties with depth perception.  Of note, he is unable to independently close his left eye related to his left facial surgery last year.  He also had an episode of dizziness during which it seemed like things were spinning around him 2 days ago.  It occurred while he was lying in bed but resolved when he sat up.  --------------------------------------------------------------------------------------------------  Past Medical History:  Diagnosis Date   Anemia    Basal cell carcinoma of face    Cancer of parotid gland ([place]) 08/2020   s/p radical left parotidectomy and neck dissection and radiation   Cataract    Diabetes mellitus without complication ([place])    Erectile dysfunction 06/11/2014   Glaucoma    Hypercalcemia     Hyperparathyroidism, unspecified ([place])    Impotence of organic origin    Other testicular hypofunction    Pure hyperglyceridemia    Special screening for malignant neoplasm of prostate    Special screening for malignant neoplasms, colon    Unspecified essential hypertension    Past Surgical History:  Procedure Laterality Date   COLONOSCOPY  06/13/2018   Dr. Lucio Edward   LEFT HEART CATH AND CORONARY ANGIOGRAPHY Left 03/16/2020   Procedure: LEFT HEART CATH AND CORONARY ANGIOGRAPHY;  Surgeon: Nelva Bush, MD;  Location: [place] [facility];  Service: Cardiovascular;  Laterality: Left;   PARATHYROIDECTOMY  2012   PAROTIDECTOMY W/ NECK DISSECTION TOTAL Left 08/2020   [facility]   skin cancer removal     Dr. Sharlett Iles   TRIGGER FINGER RELEASE Left 2008     Recent CV Pertinent Labs: Lab Results  Component Value Date   CHOL 131 12/31/2020   HDL 37.10 (L) 12/31/2020   LDLCALC 54 11/28/2013   LDLDIRECT 70.0 12/31/2020   TRIG 214.0 (H) 12/31/2020   CHOLHDL 4 12/31/2020   K 3.6 04/27/2021   BUN 19 04/27/2021   BUN 17 02/23/2020   CREATININE 1.08 04/27/2021    Past medical and surgical history were reviewed and updated in EPIC.  Current Meds  Medication Sig   aspirin EC 81 MG tablet Take 81 mg by mouth daily.   dorzolamide-timolol (COSOPT) 22.3-6.8 MG/ML ophthalmic solution Place 1 drop into both eyes 2 (two) times daily.    fenofibrate 160 MG tablet TAKE 1 TABLET BY MOUTH EVERY DAY   Ferrous Sulfate (IRON PO) Take by  mouth every other day.   ferrous sulfate 325 (65 FE) MG tablet Take 325 mg by mouth daily with breakfast.   hydrochlorothiazide (HYDRODIURIL) 25 MG tablet TAKE 1 TABLET BY MOUTH EVERY DAY   latanoprost (XALATAN) 0.005 % ophthalmic solution Place 1 drop into both eyes at bedtime.    metFORMIN (GLUCOPHAGE) 500 MG tablet TAKE ONE TABLET BY MOUTH TWICE DAILY WITH A MEAL   Multiple Vitamin (MULTIVITAMIN) tablet Take 1 tablet by mouth daily.   nitroGLYCERIN (NITROSTAT)  0.4 MG SL tablet Place 1 tablet (0.4 mg total) under the tongue every 5 (five) minutes as needed for chest pain.   Omega-3 Fatty Acids (FISH OIL) 1000 MG CAPS Take 1 capsule by mouth daily.   rosuvastatin (CRESTOR) 20 MG tablet TAKE 1 TABLET BY MOUTH EVERY DAY    Allergies: Patient has no known allergies.  Social History   Tobacco Use   Smoking status: Former    Years: 25.00    Types: Cigarettes    Quit date: 06/12/1978    Years since quitting: 43.1   Smokeless tobacco: Never  Vaping Use   Vaping Use: Never used  Substance Use Topics   Alcohol use: Not Currently    Comment: 1 beer every few weeks   Drug use: No    Family History  Problem Relation Age of Onset   Hypertension Mother    Dementia Mother    Diabetes Other        aunt   Hypertension Brother    Hypertension Brother    Hypertension Brother    Hyperlipidemia Brother    Hyperlipidemia Brother    Hyperlipidemia Brother    Hypertension Sister    Bladder Cancer Sister    Hyperlipidemia Sister    Colon cancer Neg Hx    Esophageal cancer Neg Hx    Rectal cancer Neg Hx    Stomach cancer Neg Hx     Review of Systems: A 12-system review of systems was performed and was negative except as noted in the HPI.  --------------------------------------------------------------------------------------------------  Physical Exam: BP (!) 160/84 (BP Location: Left Arm, Patient Position: Sitting, Cuff Size: Large)    Pulse (!) 51    Ht 5\' 10"  (1.778 m)    Wt 198 lb (89.8 kg)    SpO2 98%    BMI 28.41 kg/m   General:  NAD. Neck: No JVD or HJR. Lungs: Clear to auscultation bilaterally without wheezes or crackles. Heart: Bradycardic but regular with 2/6 systolic murmur.  No rubs or gallops. Abdomen: Soft, nontender, nondistended. Extremities: No lower extremity edema.  EKG: Sinus bradycardia with left axis deviation and nonspecific intraventricular conduction delay.  No significant change from prior tracing on  03/22/2020.  Lab Results  Component Value Date   WBC 6.4 04/27/2021   HGB 15.1 04/27/2021   HCT 45.8 04/27/2021   MCV 93.9 04/27/2021   PLT 247 04/27/2021    Lab Results  Component Value Date   NA 136 04/27/2021   K 3.6 04/27/2021   CL 101 04/27/2021   CO2 24 04/27/2021   BUN 19 04/27/2021   CREATININE 1.08 04/27/2021   GLUCOSE 135 (H) 04/27/2021   ALT 17 04/27/2021    Lab Results  Component Value Date   CHOL 131 12/31/2020   HDL 37.10 (L) 12/31/2020   LDLCALC 54 11/28/2013   LDLDIRECT 70.0 12/31/2020   TRIG 214.0 (H) 12/31/2020   CHOLHDL 4 12/31/2020    --------------------------------------------------------------------------------------------------  ASSESSMENT AND PLAN: Coronary artery disease without angina:  No chest pain reported.  Continue current medications to prevent progression of disease.  Aortic stenosis: Mild aortic stenosis previously noted on echocardiogram.  Systolic murmur again noted today.  Given report of intermittent lightheadedness, we will repeat an echo to ensure that aortic stenosis has not increased significantly from our prior study.  Carotid artery stenosis: Prior carotid Doppler in 2021 showed complete occlusion of left ICA and mild disease involving the right ICA.  We will repeat a carotid Doppler to ensure stability at Miguel Bradley convenience.  Continue secondary prevention with aspirin and statin therapy.  Hypertension: Blood pressure moderately elevated today.  It generally had been well controlled at other provider visits over the last 6 months.  We will defer medication changes today.  I have asked Miguel Bradley to monitor his blood pressure at home and to bring a log with him to our next visit.  Mixed hyperlipidemia associated with type 2 diabetes mellitus: Continue current regimen of rosuvastatin, fenofibrate, and fish oil to target LDL and triglycerides less than 70 and 150, respectively.  Ongoing monitoring and management of  diabetes and lipids per Dr. Diona Browner.  Left parotid cancer: Continue follow-up with oncology and ENT.  Follow-up: Return to clinic in 2 months.  Nelva Bush, MD 07/20/2021 1:32 PM

## 2021-07-19 NOTE — Telephone Encounter (Signed)
Pt called and offered appointment in [facility] this afternoon.Pt states that the "light headedness" is intermittent, but he did not feel that he needed to come in this afternoon. has decided that he would just wait and talk to Dr End tomorrow morning at his cardiology appointment. Pt stated he would update [place] after speaking to cardiology.

## 2021-07-20 ENCOUNTER — Encounter: Payer: Self-pay | Admitting: Internal Medicine

## 2021-07-20 ENCOUNTER — Ambulatory Visit: Payer: PPO | Admitting: Internal Medicine

## 2021-07-20 ENCOUNTER — Other Ambulatory Visit: Payer: Self-pay

## 2021-07-20 VITALS — BP 160/84 | HR 51 | Ht 70.0 in | Wt 198.0 lb

## 2021-07-20 DIAGNOSIS — I6523 Occlusion and stenosis of bilateral carotid arteries: Secondary | ICD-10-CM | POA: Diagnosis not present

## 2021-07-20 DIAGNOSIS — I10 Essential (primary) hypertension: Secondary | ICD-10-CM

## 2021-07-20 DIAGNOSIS — I350 Nonrheumatic aortic (valve) stenosis: Secondary | ICD-10-CM

## 2021-07-20 DIAGNOSIS — E1169 Type 2 diabetes mellitus with other specified complication: Secondary | ICD-10-CM | POA: Diagnosis not present

## 2021-07-20 DIAGNOSIS — E785 Hyperlipidemia, unspecified: Secondary | ICD-10-CM

## 2021-07-20 DIAGNOSIS — I2510 Atherosclerotic heart disease of native coronary artery without angina pectoris: Secondary | ICD-10-CM

## 2021-07-20 DIAGNOSIS — C07 Malignant neoplasm of parotid gland: Secondary | ICD-10-CM

## 2021-07-20 NOTE — Patient Instructions (Signed)
Medication Instructions:   Your physician recommends that you continue on your current medications as directed. Please refer to the Current Medication list given to you today.  *If you need a refill on your cardiac medications before your next appointment, please call your pharmacy*   Lab Work:  None ordered  Testing/Procedures:  1) Your physician has requested that you have an echocardiogram. Echocardiography is a painless test that uses sound waves to create images of your heart. It provides your doctor with information about the size and shape of your heart and how well your hearts chambers and valves are working. This procedure takes approximately one hour. There are no restrictions for this procedure.  2) Your physician has requested that you have a carotid duplex. This test is an ultrasound of the carotid arteries in your neck. It looks at blood flow through these arteries that supply the brain with blood. Allow one hour for this exam. There are no restrictions or special instructions.   Follow-Up: At [facility], you and your health needs are our priority.  As part of our continuing mission to provide you with exceptional heart care, we have created designated Provider Care Teams.  These Care Teams include your primary Cardiologist (physician) and Advanced Practice Providers (APPs -  Physician Assistants and Nurse Practitioners) who all work together to provide you with the care you need, when you need it.  We recommend signing up for the patient portal called "MyChart".  Sign up information is provided on this After Visit Summary.  MyChart is used to connect with patients for Virtual Visits (Telemedicine).  Patients are able to view lab/test results, encounter notes, upcoming appointments, etc.  Non-urgent messages can be sent to your provider as well.   To learn more about what you can do with MyChart, go to [email or web address].    Your next appointment:   2 month(s)  The  format for your next appointment:   In Person  Provider:   You may see Nelva Bush, MD or one of the following Advanced Practice Providers on your designated Care Team:   Murray Hodgkins, NP Christell Faith, PA-C Cadence Kathlen Mody, [place]

## 2021-07-25 ENCOUNTER — Encounter: Payer: PPO | Admitting: Occupational Therapy

## 2021-08-01 ENCOUNTER — Encounter: Payer: Self-pay | Admitting: Internal Medicine

## 2021-08-01 ENCOUNTER — Other Ambulatory Visit: Payer: Self-pay

## 2021-08-01 ENCOUNTER — Ambulatory Visit
Admission: RE | Admit: 2021-08-01 | Discharge: 2021-08-01 | Disposition: A | Discharge status: Home / Self Care | Payer: PPO | Source: Ambulatory Visit | Attending: Radiation Oncology | Admitting: Radiation Oncology

## 2021-08-01 ENCOUNTER — Inpatient Hospital Stay: Payer: PPO | Admitting: Internal Medicine

## 2021-08-01 ENCOUNTER — Inpatient Hospital Stay: Payer: PPO | Attending: Internal Medicine

## 2021-08-01 ENCOUNTER — Encounter: Payer: Self-pay | Admitting: Radiation Oncology

## 2021-08-01 ENCOUNTER — Inpatient Hospital Stay: Payer: PPO

## 2021-08-01 VITALS — BP 128/66 | HR 46 | Temp 98.0°F | Resp 19 | Wt 197.0 lb

## 2021-08-01 VITALS — BP 144/76 | HR 52 | Temp 97.7°F | Resp 18 | Ht 70.0 in | Wt 196.8 lb

## 2021-08-01 DIAGNOSIS — Z87891 Personal history of nicotine dependence: Secondary | ICD-10-CM | POA: Diagnosis not present

## 2021-08-01 DIAGNOSIS — I890 Lymphedema, not elsewhere classified: Secondary | ICD-10-CM | POA: Insufficient documentation

## 2021-08-01 DIAGNOSIS — Z85818 Personal history of malignant neoplasm of other sites of lip, oral cavity, and pharynx: Secondary | ICD-10-CM | POA: Insufficient documentation

## 2021-08-01 DIAGNOSIS — R42 Dizziness and giddiness: Secondary | ICD-10-CM | POA: Diagnosis not present

## 2021-08-01 DIAGNOSIS — Z79899 Other long term (current) drug therapy: Secondary | ICD-10-CM | POA: Insufficient documentation

## 2021-08-01 DIAGNOSIS — N183 Chronic kidney disease, stage 3 unspecified: Secondary | ICD-10-CM | POA: Insufficient documentation

## 2021-08-01 DIAGNOSIS — I129 Hypertensive chronic kidney disease with stage 1 through stage 4 chronic kidney disease, or unspecified chronic kidney disease: Secondary | ICD-10-CM | POA: Diagnosis not present

## 2021-08-01 DIAGNOSIS — Z7982 Long term (current) use of aspirin: Secondary | ICD-10-CM | POA: Insufficient documentation

## 2021-08-01 DIAGNOSIS — Z191 Hormone sensitive malignancy status: Secondary | ICD-10-CM | POA: Insufficient documentation

## 2021-08-01 DIAGNOSIS — Z923 Personal history of irradiation: Secondary | ICD-10-CM | POA: Insufficient documentation

## 2021-08-01 DIAGNOSIS — C07 Malignant neoplasm of parotid gland: Secondary | ICD-10-CM

## 2021-08-01 DIAGNOSIS — Z7984 Long term (current) use of oral hypoglycemic drugs: Secondary | ICD-10-CM | POA: Diagnosis not present

## 2021-08-01 DIAGNOSIS — E1122 Type 2 diabetes mellitus with diabetic chronic kidney disease: Secondary | ICD-10-CM | POA: Insufficient documentation

## 2021-08-01 DIAGNOSIS — E291 Testicular hypofunction: Secondary | ICD-10-CM | POA: Insufficient documentation

## 2021-08-01 DIAGNOSIS — C770 Secondary and unspecified malignant neoplasm of lymph nodes of head, face and neck: Secondary | ICD-10-CM | POA: Insufficient documentation

## 2021-08-01 DIAGNOSIS — Z08 Encounter for follow-up examination after completed treatment for malignant neoplasm: Secondary | ICD-10-CM | POA: Diagnosis not present

## 2021-08-01 LAB — CBC WITH DIFFERENTIAL/PLATELET
Abs Immature Granulocytes: 0.02 10*3/uL (ref 0.00–0.07)
Basophils Absolute: 0.1 10*3/uL (ref 0.0–0.1)
Basophils Relative: 1 %
Eosinophils Absolute: 0.3 10*3/uL (ref 0.0–0.5)
Eosinophils Relative: 4 %
HCT: 45.4 % (ref 39.0–52.0)
Hemoglobin: 15 g/dL (ref 13.0–17.0)
Immature Granulocytes: 0 %
Lymphocytes Relative: 18 %
Lymphs Abs: 1.2 10*3/uL (ref 0.7–4.0)
MCH: 31 pg (ref 26.0–34.0)
MCHC: 33 g/dL (ref 30.0–36.0)
MCV: 93.8 fL (ref 80.0–100.0)
Monocytes Absolute: 0.6 10*3/uL (ref 0.1–1.0)
Monocytes Relative: 9 %
Neutro Abs: 4.6 10*3/uL (ref 1.7–7.7)
Neutrophils Relative %: 68 %
Platelets: 258 10*3/uL (ref 150–400)
RBC: 4.84 MIL/uL (ref 4.22–5.81)
RDW: 13.1 % (ref 11.5–15.5)
WBC: 6.8 10*3/uL (ref 4.0–10.5)
nRBC: 0 % (ref 0.0–0.2)

## 2021-08-01 LAB — COMPREHENSIVE METABOLIC PANEL
ALT: 16 U/L (ref 0–44)
AST: 25 U/L (ref 15–41)
Albumin: 4.4 g/dL (ref 3.5–5.0)
Alkaline Phosphatase: 56 U/L (ref 38–126)
Anion gap: 9 (ref 5–15)
BUN: 20 mg/dL (ref 8–23)
CO2: 23 mmol/L (ref 22–32)
Calcium: 9.6 mg/dL (ref 8.9–10.3)
Chloride: 103 mmol/L (ref 98–111)
Creatinine, Ser: 1.07 mg/dL (ref 0.61–1.24)
GFR, Estimated: >60 mL/min (ref >60)
Glucose, Bld: 116 mg/dL — ABNORMAL HIGH (ref 70–99)
Potassium: 3.5 mmol/L (ref 3.5–5.1)
Sodium: 135 mmol/L (ref 135–145)
Total Bilirubin: 0.3 mg/dL (ref 0.3–1.2)
Total Protein: 7.7 g/dL (ref 6.5–8.1)

## 2021-08-01 MED ORDER — LEUPROLIDE ACETATE (6 MONTH) 45 MG ~~LOC~~ KIT
45.0000 mg | PACK | Freq: Once | SUBCUTANEOUS | Status: AC
  Administered 2021-08-01: 45 mg via SUBCUTANEOUS
  Filled 2021-08-01: qty 45

## 2021-08-01 NOTE — Progress Notes (Signed)
[place] NOTE  Patient Care Team: Jinny Sanders, MD as PCP - General End, Harrell Gave, MD as PCP - Cardiology (Cardiology) Hayden Pedro, MD as Consulting Physician (Ophthalmology) Lorelee Cover., MD as Referring Physician (Ophthalmology) Oneta Rack, MD as Referring Physician (Dermatology) Ladene Artist, MD as Consulting Physician (Gastroenterology) Rosalene Billings., MD as Referring Physician (Dentistry) Debbora Dus, [facility] as Pharmacist (Pharmacist) Cammie Sickle, MD as Consulting Physician (Hematology and Oncology)  CHIEF COMPLAINTS/PURPOSE OF CONSULTATION: parotid cancer  Oncology History Overview Note  Addendum    Upon clinician request, additional immunostains are performed at [facility] on block F2, with the following results in the tumor cells:   HER2 ([place], clone [id number]) Interpretation: Positive IHC Score: 3+ (using the ASCO/CAP breast biomarker scoring guidelines; there are no consensus guideline for scoring HER2 in salivary duct carcinoma)   This slide has also been reviewed by Dr. [facility] breast pathology) who concurs.  Addendum electronically signed by Tomasa Blase, MD on 09/03/2020 at  3:21 PM  Diagnosis    A. "Tissue around stylomastoid foramen"  - Fibrovascular tissue extensively involved by carcinoma   B. "Main trunk facial nerve"  - Nerve and fibrous tissue extensively involved by carcinoma     C. "Distal facial nerve" - Nerve tissue; negative for carcinoma     D. "Proximal mastoid nerve margin" - Nerve tissue; negative for carcinoma   E: Left upper superficial parotid, parotidectomy - Benign parotid parenchyma with focal acute and chronic inflammation - Negative for carcinoma   F: Left inferior parotid with sternocleidomastoid muscle, resection - High-grade salivary gland carcinoma, most consistent with salivary duct carcinoma (see comment and synoptic report for further details) - Cauterized  tumor present at inked deep surgical margin within fibrous tissue adjacent to skeletal muscle - Metastatic carcinoma in 1.1 cm nodal conglomerate and 4 of 4 intact lymph nodes (at least 5/5); positive for extranodal extension    G: Lymph node, left neck level 5, lymphadenectomy - Benign adipose; no lymph nodes or carcinoma identified   H: Lymph node, left neck level 1, lymphadenectomy - Metastatic carcinoma in 1 of 5 lymph nodes (1/5); maximum size of metastasis 2 mm; negative for extranodal extension.   I: Left omohyoid, excision - Benign skeletal muscle; negative for carcinoma   J: Left deep lobe parotid, excision - Benign parotid tissue; negative for carcinoma   K: Parotid tissue, biopsy - Benign parotid tissue - No metastatic carcinoma identified in 1 lymph nodes (0/1)   L: Left tissue over mastoid, biopsy - Benign parotid parenchyma, dense fibrous tissue, and bone; negative for carcinoma   M: "Stylomastoid foramen stitch on proximal nerve", excision - Large nerve, negative for perineural/intraneural carcinoma; stitched margin negative - Attached fibrous tissue and parotid parenchyma positive for carcinoma, focally invading around but not within large nerve segment, with crushed/cauterized tumor present at tissue edges   N: Tissue over styloid process, biopsy - Benign parotid tissue; negative for carcinoma   O: Posterior digastric margin, biopsy - Benign skeletal muscle; negative for carcinoma   P: Left neck dissections level 1 through 5, lymphadenectomy - Metastatic carcinoma in 11 of 26 lymph nodes (11/26); maximum size of metastasis 20 mm; two nodes (16 mm and 14 mm) positive for extranodal extension (ENEma, up to 4 mm beyond capsule)   Diagnosis: 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 -----------------------------------------------------------------   07/13/10: Right parathyroidectomy Pathology: Hypercellular parathyroid gland   -------------------------------------------------------------------------------------   # MARCH 2022- CT chest- [[facility]] Subcentimeter pulmonary nodules measuring up to 0.8 cm, indeterminate. No comparison available. Attention on follow-up is recommended.  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 Stage: pT4aN3b-stage IVb.  HER-2 positive; androgen receptor positive.]  PET scan-negative for distant metastatic disease.   # CAD [card]; DM- 2; CKD-III     Cancer of parotid gland ([place])  09/11/2020 Initial Diagnosis   Cancer of parotid gland ([place])   09/16/2020 Cancer Staging   Staging form: Major Salivary Glands, AJCC 8th Edition - Pathologic: Stage IVB (pT4a, pN3b, cM0) - Signed by Cammie Sickle, MD on 09/16/2020       HISTORY OF PRESENTING ILLNESS: Accompanied by his daughter ambulating independently.  Miguel Bradley 81 y.o.  male pleasant patient with locally advanced high-grade salivary gland/parotid cancer HER2 positive;AR positive-T4AN3-stage IVb is currently s/p adjuvant radiation; on Eligard q6M is here for follow-up.  Patient had recently called because of dizziness.  Evaluated with cardiology.  Symptoms improved status post Dramamine.  Denies any weight loss.  No hot flashes.  Denies any significant joint pains or bone pain.  Chronic mild fatigue.  Denies any worsening swelling in the neck.  S/p physical therapy.  Review of Systems  Constitutional:   Positive for malaise/fatigue. Negative for chills, diaphoresis and fever.  HENT:  Negative for nosebleeds and sore throat.   Eyes:  Negative for double vision.  Respiratory:  Negative for cough, hemoptysis, sputum production, shortness of breath and wheezing.   Cardiovascular:  Negative for chest pain, palpitations, orthopnea and leg swelling.  Gastrointestinal:  Negative for abdominal pain, blood in stool, constipation, diarrhea, heartburn, melena, nausea and vomiting.  Genitourinary:  Negative for dysuria, frequency and urgency.  Musculoskeletal:  Positive for joint pain. Negative for back pain.  Skin: Negative.  Negative for itching and rash.  Neurological:  Negative for dizziness, tingling, focal weakness, weakness and headaches.  Endo/Heme/Allergies:  Does not bruise/bleed easily.  Psychiatric/Behavioral:  Negative for depression. The patient is not nervous/anxious and does not have insomnia.     MEDICAL HISTORY:  Past Medical History:  Diagnosis Date   Anemia    Basal cell carcinoma of face    Cancer of parotid gland ([place]) 08/2020   s/p radical left parotidectomy and neck dissection and radiation   Cataract    Diabetes mellitus without complication ([place])    Erectile dysfunction 06/11/2014   Glaucoma    Hypercalcemia    Hyperparathyroidism, unspecified ([place])    Impotence of organic origin    Other testicular hypofunction    Pure hyperglyceridemia    Special screening for malignant neoplasm of prostate    Special screening for malignant neoplasms, colon    Unspecified essential hypertension     SURGICAL HISTORY: Past Surgical History:  Procedure Laterality Date   COLONOSCOPY  06/13/2018   Dr. Lucio Edward   LEFT HEART CATH AND CORONARY ANGIOGRAPHY Left 03/16/2020   Procedure: LEFT HEART CATH AND CORONARY ANGIOGRAPHY;  Surgeon: Nelva Bush, MD;  Location: [facility];  Service: Cardiovascular;  Laterality: Left;  PARATHYROIDECTOMY  2012   PAROTIDECTOMY W/  NECK DISSECTION TOTAL Left 08/2020   [facility]   skin cancer removal     Dr. Sharlett Iles   TRIGGER FINGER RELEASE Left 2008    SOCIAL HISTORY: Social History   Socioeconomic History   Marital status: Married    Spouse name: Not on file   Number of children: 1   Years of education: Not on file   Highest education level: Not on file  Occupational History   Occupation: Water quality scientist at [employer] Use   Smoking status: Former    Years: 25.00    Types: Cigarettes    Quit date: 06/12/1978    Years since quitting: 43.1   Smokeless tobacco: Never  Vaping Use   Vaping Use: Never used  Substance and Sexual Activity   Alcohol use: Not Currently    Comment: 1 beer every few weeks   Drug use: No   Sexual activity: Not Currently  Other Topics Concern   Not on file  Social History Narrative   No exercise, plays golf. Lives in [place]; self- [place]; daughter. puchasing dept in [place]. Rare alcohol. Quit smoking in 1980.    Social Determinants of Health   Financial Resource Strain: Low Risk    Difficulty of Paying Living Expenses: Not very hard  Food Insecurity: Not on file  Transportation Needs: Not on file  Physical Activity: Not on file  Stress: Not on file  Social Connections: Not on file  Intimate Partner Violence: Not on file    FAMILY HISTORY: Family History  Problem Relation Age of Onset   Hypertension Mother    Dementia Mother    Diabetes Other        aunt   Hypertension Brother    Hypertension Brother    Hypertension Brother    Hyperlipidemia Brother    Hyperlipidemia Brother    Hyperlipidemia Brother    Hypertension Sister    Bladder Cancer Sister    Hyperlipidemia Sister    Colon cancer Neg Hx    Esophageal cancer Neg Hx    Rectal cancer Neg Hx    Stomach cancer Neg Hx     ALLERGIES:  has No Known Allergies.  MEDICATIONS:  Current Outpatient Medications  Medication Sig Dispense Refill   aspirin EC 81 MG tablet Take 81 mg by mouth daily.      dorzolamide-timolol (COSOPT) 22.3-6.8 MG/ML ophthalmic solution Place 1 drop into both eyes 2 (two) times daily.      fenofibrate 160 MG tablet TAKE 1 TABLET BY MOUTH EVERY DAY 90 tablet 3   Ferrous Sulfate (IRON PO) Take by mouth every other day.     ferrous sulfate 325 (65 FE) MG tablet Take 325 mg by mouth daily with breakfast.     hydrochlorothiazide (HYDRODIURIL) 25 MG tablet TAKE 1 TABLET BY MOUTH EVERY DAY 90 tablet 1   latanoprost (XALATAN) 0.005 % ophthalmic solution Place 1 drop into both eyes at bedtime.      metFORMIN (GLUCOPHAGE) 500 MG tablet TAKE ONE TABLET BY MOUTH TWICE DAILY WITH A MEAL 180 tablet 1   Multiple Vitamin (MULTIVITAMIN) tablet Take 1 tablet by mouth daily.     nitroGLYCERIN (NITROSTAT) 0.4 MG SL tablet Place 1 tablet (0.4 mg total) under the tongue every 5 (five) minutes as needed for chest pain. 25 tablet prn   Omega-3 Fatty Acids (FISH OIL) 1000 MG CAPS Take 1 capsule by mouth daily.     rosuvastatin (CRESTOR) 20 MG  tablet TAKE 1 TABLET BY MOUTH EVERY DAY 90 tablet 1   No current facility-administered medications for this visit.      Marland Kitchen  PHYSICAL EXAMINATION: ECOG PERFORMANCE STATUS: 0 - Asymptomatic  Vitals:   08/01/21 1047  BP: 128/66  Pulse: (!) 46  Resp: 19  Temp: 98 F (36.7 C)  SpO2: 98%   Filed Weights   08/01/21 1047  Weight: 197 lb (89.4 kg)    Physical Exam HENT:     Head: Normocephalic and atraumatic.     Mouth/Throat:     Pharynx: No oropharyngeal exudate.  Eyes:     Pupils: Pupils are equal, round, and reactive to light.  Neck:     Comments: Incision from the left neck dissection; parotidectomy noted.  Well-healing.  No evidence of any infection. Cardiovascular:     Rate and Rhythm: Normal rate and regular rhythm.  Pulmonary:     Effort: No respiratory distress.     Breath sounds: No wheezing.  Abdominal:     General: Bowel sounds are normal. There is no distension.     Palpations: Abdomen is soft. There is no mass.      Tenderness: There is no abdominal tenderness. There is no guarding or rebound.  Musculoskeletal:        General: No tenderness. Normal range of motion.     Cervical back: Normal range of motion and neck supple.  Skin:    General: Skin is warm.  Neurological:     Mental Status: He is alert and oriented to person, place, and time.     Comments: Left facial droop noted-post surgery.  Psychiatric:        Mood and Affect: Affect normal.     LABORATORY DATA:  I have reviewed the data as listed Lab Results  Component Value Date   WBC 6.8 08/01/2021   HGB 15.0 08/01/2021   HCT 45.4 08/01/2021   MCV 93.8 08/01/2021   PLT 258 08/01/2021   Recent Labs    01/12/21 1019 04/27/21 0934 08/01/21 1024  NA 140 136 135  K 4.5 3.6 3.5  CL 103 101 103  CO2 _0 GLUCOSE 182* 135* 116*  BUN _1 CREATININE 1.18 1.08 1.07  CALCIUM 9.0 9.0 9.6  GFRNONAA >60 >60 >60  PROT 7.1 7.5 7.7  ALBUMIN 3.9 3.9 4.4  AST _2 ALT _3 ALKPHOS 55 60 56  BILITOT 0.6 0.2* 0.3    RADIOGRAPHIC STUDIES: I have personally reviewed the radiological images as listed and agreed with the findings in the report. No results found.  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 Stage: pT4aN3b-stage IVb.  HER-2 positive; androgen receptor positive.] Patient currently s/p adjuvant radiation-[finished July 16th. 2022]. STABLE.   #Clinically no evidence of recurrence.  PET scan October 2022 negative.  Continue Eligard ADT every 6 months; Eligard today. Awaiting PET in April, 2023 [facility]]  # ? Vertigo [improved with meclizine]; aortic stenosis/ Carotid [2021 showed complete occlusion of left ICA and mild  disease involving the right ICA.] -S/p cardiology evaluation [Dr.End].   Continue secondary prevention with aspirin and statin therapy.  # CKD- stage III-  [1 BID; tylenol]-STABLE  #Lymphedema  neck s/p surgery-s/p  lymphedema treatment/prevention- STABLE.   # dental clearance: ok with dental extraction  # DISPOSITION:  # Proceed with Eligard # follow up in 3 months- MD labs- cbc/cmp-Dr.B     All questions were answered. The patient knows to call the clinic with any problems, questions or concerns.    Cammie Sickle, MD 08/01/2021 12:47 PM

## 2021-08-01 NOTE — Assessment & Plan Note (Addendum)
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 Stage: pT4aN3b-stage IVb.  HER-2 positive; androgen receptor positive.] Patient currently s/p adjuvant radiation-[finished July 16th. 2022]. STABLE.   #Clinically no evidence of recurrence.  PET scan October 2022 negative.  Continue Eligard ADT every 6 months; Eligard today. Awaiting PET in April, 2023 [facility]]  # ? Vertigo [improved with meclizine]; aortic stenosis/ Carotid [2021 showed complete occlusion of left ICA and mild disease involving the right ICA.] -S/p cardiology evaluation [Dr.End].   Continue secondary prevention with aspirin and statin therapy.  # CKD- stage III-  [1 BID; tylenol]-STABLE  #Lymphedema neck s/p surgery-s/p  lymphedema treatment/prevention- STABLE.   # dental clearance: ok with dental extraction  # DISPOSITION:  # Proceed with Eligard # follow up in 3 months- MD labs- cbc/cmp-Dr.B

## 2021-08-01 NOTE — Progress Notes (Signed)
Radiation Oncology Follow up Note  Name: Miguel Bradley   Date:   08/01/2021 MRN:  [id number] DOB: May 24, 1941    This 81 y.o. male presents to the clinic today for 75-month follow-up status post adjuvant radiation therapy to his left parotid bed and neck for locally advanced salivary duct carcinoma of the parotid status post resection with multiple lymph nodes involved.Marland Kitchen  REFERRING PROVIDER: Jinny Sanders, MD  HPI: Patient is an 81 year old male now out 8 months having completed adjuvant radiation therapy status post resection of his left parotid and neck nodes with an ALT flap reconstruction and sacrifice of the facial nerve for salivary gland malignancy.  Seen today in routine follow-up he is doing well specifically denies any head and neck pain or dysphagia.  He is receiving therapy for for some lymphedema in his neck related to his past surgery and radiation..  He had a PET CT scan back in October which I have reviewed which was negative for hypermetabolic activity.  COMPLICATIONS OF TREATMENT: none  FOLLOW UP COMPLIANCE: keeps appointments   PHYSICAL EXAM:  BP (!) 144/76    Pulse (!) 52    Temp 97.7 F (36.5 C)    Resp 18    Ht 5\' 10"  (1.778 m)    Wt 196 lb 12.8 oz (89.3 kg)    BMI 28.24 kg/m  There is some swelling in the left parotid bed as well as left neck consistent with prior surgery.  No discernible adenopathy is identified.  Well-developed well-nourished patient in NAD. HEENT reveals PERLA, EOMI, discs not visualized.  Oral cavity is clear. No oral mucosal lesions are identified. Neck is clear without evidence of cervical or supraclavicular adenopathy. Lungs are clear to A&P. Cardiac examination is essentially unremarkable with regular rate and rhythm without murmur rub or thrill. Abdomen is benign with no organomegaly or masses noted. Motor sensory and DTR levels are equal and symmetric in the upper and lower extremities. Cranial nerves II through XII are grossly intact.  Proprioception is intact. No peripheral adenopathy or edema is identified. No motor or sensory levels are noted. Crude visual fields are within normal range.  RADIOLOGY RESULTS: PET scan reviewed compatible with above-stated findings showing no evidence of disease.  PLAN: Present time patient is now 8 months out with no evidence of disease.  And pleased with his overall progress.  I have asked to see him back in 6 months and then will start once year follow-up visits.  I have asked him to make follow-up appointment with ENT.  Patient knows to call with any concerns.  I would like to take this opportunity to thank you for allowing me to participate in the care of your patient.Noreene Filbert, MD

## 2021-08-02 ENCOUNTER — Encounter: Payer: Self-pay | Admitting: Family Medicine

## 2021-08-02 ENCOUNTER — Ambulatory Visit (INDEPENDENT_AMBULATORY_CARE_PROVIDER_SITE_OTHER): Payer: PPO | Admitting: Family Medicine

## 2021-08-02 VITALS — BP 128/78 | HR 54 | Temp 98.1°F | Ht 70.0 in | Wt 200.0 lb

## 2021-08-02 DIAGNOSIS — E785 Hyperlipidemia, unspecified: Secondary | ICD-10-CM | POA: Diagnosis not present

## 2021-08-02 DIAGNOSIS — I6522 Occlusion and stenosis of left carotid artery: Secondary | ICD-10-CM

## 2021-08-02 DIAGNOSIS — I152 Hypertension secondary to endocrine disorders: Secondary | ICD-10-CM

## 2021-08-02 DIAGNOSIS — Z6828 Body mass index (BMI) 28.0-28.9, adult: Secondary | ICD-10-CM | POA: Diagnosis not present

## 2021-08-02 DIAGNOSIS — E1142 Type 2 diabetes mellitus with diabetic polyneuropathy: Secondary | ICD-10-CM

## 2021-08-02 DIAGNOSIS — E1159 Type 2 diabetes mellitus with other circulatory complications: Secondary | ICD-10-CM | POA: Diagnosis not present

## 2021-08-02 DIAGNOSIS — E1169 Type 2 diabetes mellitus with other specified complication: Secondary | ICD-10-CM | POA: Diagnosis not present

## 2021-08-02 DIAGNOSIS — E11319 Type 2 diabetes mellitus with unspecified diabetic retinopathy without macular edema: Secondary | ICD-10-CM

## 2021-08-02 DIAGNOSIS — C07 Malignant neoplasm of parotid gland: Secondary | ICD-10-CM

## 2021-08-02 DIAGNOSIS — E663 Overweight: Secondary | ICD-10-CM | POA: Diagnosis not present

## 2021-08-02 DIAGNOSIS — E113393 Type 2 diabetes mellitus with moderate nonproliferative diabetic retinopathy without macular edema, bilateral: Secondary | ICD-10-CM

## 2021-08-02 LAB — POCT GLYCOSYLATED HEMOGLOBIN (HGB A1C): Hemoglobin A1C: 6.1 % — AB (ref 4.0–5.6)

## 2021-08-02 LAB — HM DIABETES FOOT EXAM

## 2021-08-02 NOTE — Assessment & Plan Note (Signed)
Associated with diabetes. No pain

## 2021-08-02 NOTE — Assessment & Plan Note (Signed)
Has follow up imaging with cardiology planned.  LDL previously at goal < 70  Lab Results  Component Value Date   CHOL 131 12/31/2020   HDL 37.10 (L) 12/31/2020   LDLCALC 54 11/28/2013   LDLDIRECT 70.0 12/31/2020   TRIG 214.0 (H) 12/31/2020   CHOLHDL 4 12/31/2020

## 2021-08-02 NOTE — Patient Instructions (Addendum)
Keep working on healthy eating and regular activity.  Continue current medication, but call if blood sugars dropping < 60!

## 2021-08-02 NOTE — Progress Notes (Signed)
Patient ID: Miguel Bradley, male    DOB: 10-Feb-1941, 81 y.o.   MRN: [id number]  This visit was conducted in person.  BP 128/78 (BP Location: Left Arm, Patient Position: Sitting, Cuff Size: Normal)    Pulse (!) 54    Temp 98.1 F (36.7 C) (Oral)    Ht 5\' 10"  (1.778 m)    Wt 200 lb (90.7 kg)    SpO2 98%    BMI 28.70 kg/m    CC:  Chief Complaint  Patient presents with   Diabetes    Subjective:   HPI: Miguel Bradley is a 81 y.o. male presenting on 08/02/2021 for Diabetes  Diabetes:  excellent control on metformin 500 mg BID Lab Results  Component Value Date   HGBA1C 6.1 (A) 08/02/2021  Using medications without difficulties: Hypoglycemic episodes: none Hyperglycemic episodes:none Feet problems: no ulcers Blood Sugars averaging:  120-130 eye exam within last year:   Yes.. will get records Associated with retinopathy and retinopathy    He has first golf game set up next week.. first time in next year.  Weight loss has stopped following parotid gland cancer resection  Reviewed recent OV note from Dr. Burlene Arnt.  Wt Readings from Last 3 Encounters:  08/02/21 200 lb (90.7 kg)  08/01/21 196 lb 12.8 oz (89.3 kg)  08/01/21 197 lb (89.4 kg)   HAs upcoming appt with cardiology to follow caotid stenosis on left.    Relevant past medical, surgical, family and social history reviewed and updated as indicated. Interim medical history since our last visit reviewed. Allergies and medications reviewed and updated. Outpatient Medications Prior to Visit  Medication Sig Dispense Refill   aspirin EC 81 MG tablet Take 81 mg by mouth daily.     dorzolamide-timolol (COSOPT) 22.3-6.8 MG/ML ophthalmic solution Place 1 drop into both eyes 2 (two) times daily.      fenofibrate 160 MG tablet TAKE 1 TABLET BY MOUTH EVERY DAY 90 tablet 3   ferrous sulfate 325 (65 FE) MG tablet Take 325 mg by mouth daily with breakfast.     hydrochlorothiazide (HYDRODIURIL) 25 MG tablet TAKE 1 TABLET BY MOUTH EVERY  DAY 90 tablet 1   latanoprost (XALATAN) 0.005 % ophthalmic solution Place 1 drop into both eyes at bedtime.      metFORMIN (GLUCOPHAGE) 500 MG tablet TAKE ONE TABLET BY MOUTH TWICE DAILY WITH A MEAL 180 tablet 1   Multiple Vitamin (MULTIVITAMIN) tablet Take 1 tablet by mouth daily.     nitroGLYCERIN (NITROSTAT) 0.4 MG SL tablet Place 1 tablet (0.4 mg total) under the tongue every 5 (five) minutes as needed for chest pain. 25 tablet prn   Omega-3 Fatty Acids (FISH OIL) 1000 MG CAPS Take 1 capsule by mouth daily.     rosuvastatin (CRESTOR) 20 MG tablet TAKE 1 TABLET BY MOUTH EVERY DAY 90 tablet 1   Ferrous Sulfate (IRON PO) Take by mouth every other day. (Patient not taking: Reported on 08/02/2021)     No facility-administered medications prior to visit.     Per HPI unless specifically indicated in ROS section below Review of Systems  Constitutional:  Negative for fatigue and fever.  HENT:  Negative for ear pain.   Eyes:  Negative for pain.  Respiratory:  Negative for cough and shortness of breath.   Cardiovascular:  Negative for chest pain, palpitations and leg swelling.  Gastrointestinal:  Negative for abdominal pain.  Genitourinary:  Negative for dysuria.  Musculoskeletal:  Negative for arthralgias.  Neurological:  Negative for syncope, light-headedness and headaches.  Psychiatric/Behavioral:  Negative for dysphoric mood.   Objective:  BP 128/78 (BP Location: Left Arm, Patient Position: Sitting, Cuff Size: Normal)    Pulse (!) 54    Temp 98.1 F (36.7 C) (Oral)    Ht 5\' 10"  (1.778 m)    Wt 200 lb (90.7 kg)    SpO2 98%    BMI 28.70 kg/m   Wt Readings from Last 3 Encounters:  08/02/21 200 lb (90.7 kg)  08/01/21 196 lb 12.8 oz (89.3 kg)  08/01/21 197 lb (89.4 kg)      Physical Exam Constitutional:      Appearance: He is well-developed.  HENT:     Head: Normocephalic.     Comments: Post surgical changes to left side of face.. cannot fully close left eye, lip droop and some trouble  breathing though ;eft nostril    Right Ear: Hearing normal.     Left Ear: Hearing normal.     Nose: Nose normal.  Neck:     Thyroid: No thyroid mass or thyromegaly.     Vascular: No carotid bruit.     Trachea: Trachea normal.  Cardiovascular:     Rate and Rhythm: Normal rate and regular rhythm.     Pulses: Normal pulses.     Heart sounds: Heart sounds not distant. No murmur heard.   No friction rub. No gallop.     Comments: No peripheral edema Pulmonary:     Effort: Pulmonary effort is normal. No respiratory distress.     Breath sounds: Normal breath sounds.  Skin:    General: Skin is warm and dry.     Findings: No rash.  Psychiatric:        Speech: Speech normal.        Behavior: Behavior normal.        Thought Content: Thought content normal.      Diabetic foot exam: Normal inspection No skin breakdown No calluses  Normal DP pulses Normal sensation to light touch and monofilament Nails normal  Results for orders placed or performed in visit on 08/02/21  POCT glycosylated hemoglobin (Hb A1C)  Result Value Ref Range   Hemoglobin A1C 6.1 (A) 4.0 - 5.6 %   HbA1c POC (<> result, manual entry)     HbA1c, POC (prediabetic range)     HbA1c, POC (controlled diabetic range)      This visit occurred during the SARS-CoV-2 public health emergency.  Safety protocols were in place, including screening questions prior to the visit, additional usage of staff PPE, and extensive cleaning of exam room while observing appropriate contact time as indicated for disinfecting solutions.   COVID 19 screen:  No recent travel or known exposure to COVID19 The patient denies respiratory symptoms of COVID 19 at this time. The importance of social distancing was discussed today.   Body mass index is 28.7 kg/m.  Assessment and Plan Problem List Items Addressed This Visit     Diabetes mellitus with ophthalmic complication ([place]) - Primary (Chronic)    Chronic, improved control. Discussed  hypoglycemia... appetite is improved so he is back to normal diet. If CBGs dropping will consider stopping medication.  metformin 500 mg BID      Relevant Orders   POCT glycosylated hemoglobin (Hb A1C) (Completed)   Diabetic neuropathy (HCC) (Chronic)    Associated with diabetes. No pain       Diabetic retinopathy associated with type 2 diabetes mellitus (HCC) (Chronic)  Will obtain eye exam record from retinal specialist. Associated with DM.      Cancer of parotid gland ([place])   Hyperlipidemia associated with type 2 diabetes mellitus ([place])   Hypertension associated with diabetes ([place])    Stable, chronic.  Continue current medication.   HCTZ 25 mg daily.  12/2020 urine microalbumin      ICAO (internal carotid artery occlusion), left    Has follow up imaging with cardiology planned.  LDL previously at goal < 70  Lab Results  Component Value Date   CHOL 131 12/31/2020   HDL 37.10 (L) 12/31/2020   LDLCALC 54 11/28/2013   LDLDIRECT 70.0 12/31/2020   TRIG 214.0 (H) 12/31/2020   CHOLHDL 4 12/31/2020         Overweight with body mass index (BMI) of 28 to 28.9 in adult    Encouraged exercise, weight loss, healthy eating habits.            Eliezer Lofts, MD

## 2021-08-02 NOTE — Assessment & Plan Note (Signed)
Stable, chronic.  Continue current medication.   HCTZ 25 mg daily.  12/2020 urine microalbumin

## 2021-08-02 NOTE — Assessment & Plan Note (Signed)
Encouraged exercise, weight loss, healthy eating habits. ? ?

## 2021-08-02 NOTE — Assessment & Plan Note (Signed)
Will obtain eye exam record from retinal specialist. Associated with DM.

## 2021-08-02 NOTE — Assessment & Plan Note (Signed)
Chronic, improved control. Discussed hypoglycemia... appetite is improved so he is back to normal diet. If CBGs dropping will consider stopping medication.  metformin 500 mg BID

## 2021-08-04 ENCOUNTER — Ambulatory Visit: Payer: PPO | Admitting: Occupational Therapy

## 2021-08-04 ENCOUNTER — Other Ambulatory Visit: Payer: Self-pay

## 2021-08-04 DIAGNOSIS — I890 Lymphedema, not elsewhere classified: Secondary | ICD-10-CM

## 2021-08-04 NOTE — Therapy (Signed)
[place] [facility] SERVICES [address] [place], [place], [address] Phone: [phone]   Fax:  [phone]  Occupational Therapy Treatment  Patient Details  Name: Miguel Bradley MRN: [id number] Date of Birth: 08/02/1940 Referring Provider (OT): Cammie Sickle, MD   Encounter Date: 08/04/2021   OT End of Session - 08/04/21 1511     Visit Number 11    Number of Visits 36    Date for OT Re-Evaluation 08/08/21    OT Start Time 0305    OT Stop Time 0405    OT Time Calculation (min) 60 min    Activity Tolerance Patient tolerated treatment well;No increased pain    Behavior During Therapy [facility] for tasks assessed/performed             Past Medical History:  Diagnosis Date   Anemia    Basal cell carcinoma of face    Cancer of parotid gland ([place]) 08/2020   s/p radical left parotidectomy and neck dissection and radiation   Cataract    Diabetes mellitus without complication ([place])    Erectile dysfunction 06/11/2014   Glaucoma    Hypercalcemia    Hyperparathyroidism, unspecified ([place])    Impotence of organic origin    Other testicular hypofunction    Pure hyperglyceridemia    Special screening for malignant neoplasm of prostate    Special screening for malignant neoplasms, colon    Unspecified essential hypertension     Past Surgical History:  Procedure Laterality Date   COLONOSCOPY  06/13/2018   Dr. Lucio Edward   LEFT HEART CATH AND CORONARY ANGIOGRAPHY Left 03/16/2020   Procedure: LEFT HEART CATH AND CORONARY ANGIOGRAPHY;  Surgeon: Nelva Bush, MD;  Location: [place] [facility];  Service: Cardiovascular;  Laterality: Left;   PARATHYROIDECTOMY  2012   PAROTIDECTOMY W/ NECK DISSECTION TOTAL Left 08/2020   [facility]   skin cancer removal     Dr. Sharlett Iles   TRIGGER FINGER RELEASE Left 2008    There were no vitals filed for this visit.   Subjective Assessment - 08/04/21 1604     Subjective  Miguel Bradley presents for OT  Rx visit 11/36 to address L head and neck lymphedema 2/2 surgical intervention and XRT for salivary duct carcinoma. Miguel Bradley  denies LR related pain today. He reports he played 18 holes of golf this morning with friends and really enjoyed it.    Pertinent History S/p 07/13/10 RIGHT PARATHYROIDECTOMY, 08/24/20 L TOTAL PAROTIDECTOMY ; Hx Parotid gland Ca, LN levels 1-5: metastatic carcinoma 11/26 LN. Lateral temporal bone resection and reconstruction with L anterolateral thigh flap. 11/24/20 Completed adjuvant X[facility] at [facility], 02/03/21- present Began Eligard for androgen deprovation therapy. DM, Diabetic neuropathy, CAD, Basal Cell Carcinoma , face, HTN, Renal insufficiency, ICAO-Left, mild aortic stenosis, glaucoma    Limitations weakness and paralysis of facial muscles-L , xerostomia, taste dysfunction, head forwared posture, neck tightness w/ decreased AROM, chronic head / neck swelling    Repetition Increases Symptoms    Special Tests Intake FOTO: 98/100    Currently in Pain? No/denies    Pain Onset More than a month ago   s/p cancer surgery and XRT                         OT Treatments/Exercises (OP) - 08/04/21 1606       ADLs   ADL Education Given Yes      Manual Therapy   Manual  Therapy Edema management;Manual Lymphatic Drainage (MLD);Taping    Manual Lymphatic Drainage (MLD) MLD to left head and neck avoiding deep strokes to carotid region in keeping with precautions for this patient. Directed lymph congestion using  proximal to distal method  working segmentally back wards from L axila  to chest, then from clavicular region, then from chin, submental and jawline towards clavicle,  then stimulating pre and post auricular nodes,  and finally using light J strokes accross face towards ears.. Unfortunately we did not have  time to perform fibrosis techniques or scar massage, but was able to demonstrate and review "marching soldiers" for Miguel Bradley and friend.                    OT  Education - 08/04/21 1606     Education Details Continued Miguel Bradley/ CG edu for lymphedema self care  and home program throughout session. Topics include compression options, simple self-MLD, therapeutic lymphatic pumping exercises, skin/nail care, risk reduction factors and LE precautions. Emphasis on Miguel Bradley and sig other training for lymphatic map of head and neck and simplified sequencing for self MLD using axillary watershed. All questions answered to the Miguel Bradley satisfaction, and Miguel Bradley demonstrates understanding by report.    Person(s) Educated Patient    Methods Explanation;Demonstration;Handout    Comprehension Verbalized understanding;Returned demonstration                 OT Long Term Goals - 07/18/21 1615       OT LONG TERM GOAL #1   Title Miguel Bradley will be modified independent with all lymphedema home program companents using printed reference for rference, including simple self-MLD, therapeutic pumping exercises, stretching and AROM ther ex, skin care  and compression PRN.    Baseline Max A    Time 6    Period Days    Status Achieved    Target Date --   6th OT Rx visit     OT LONG TERM GOAL #2   Title Given this patients Intake score of TBA/100 on the functional outcomes FOTO tool, patient will experience an increase in function of 3 points to improve basic and instrumental ADLs performance, including lymphedema self-care.    Baseline TBA Rx visit 1    Time 12    Period Weeks    Status On-going    Target Date 10/16/21      OT LONG TERM GOAL #3   Title Miguel Bradley will demonstrate a 5% girth reduction at upper and lower neck landmarks and at circumferential measurement of head (under chin) to limit LE progression and to regain maximum AROM .   10th visit 07/18/21: Partially met w/ 15.95% reduction in girth of lower neck since commencing OT in Nov 2022.   Baseline MaX A    Time 12    Period Weeks    Status Partially Met    Target Date 10/16/21      OT LONG TERM GOAL #4   Title Miguel Bradley will be  independent with head and upper body postural corrections by DC to maximise breathing and limit negative impact on balance.    Baseline Max A    Time 12    Period Weeks    Status Achieved    Target Date 08/08/21      OT LONG TERM GOAL #5   Title Miguel Bradley will demonstrate knowlwdge and understanding of lymphedema precautions and prevention strategies by discussing 5 precautions/ strategies using a printed reference ( modified independent)  to imit progression  of chronic lymphedema.    Baseline Max A    Time 6    Period Days    Status Achieved    Target Date --   6th LE Rx visit                  Plan - 08/04/21 1559     Clinical Impression Statement Continued MLD to L head and neck       as established today. Provided myofacial stretch to L lateral neck  flexors. L neck     inferior to ear is more swollen and dense to palpation. Miguel Bradley reports he played 18 hyoles of golf this morning, and that, no doubt, was more exercise requiring constant head turning than he is used to. The outdoor temperature was also elevated to nearly 80 degrees today. Thjose 2 factors combined likely increased lymphatic load above level Miguel Bradley body can handle with decreased muscle pump ability in L neck and face. Miguel Bradley encouraged to stretch     slowly for  minutes  vs seconds at a time, and to continue ther ex and vibrating massager daily. If swelling in L neck is reduced necxt visit, we may reduce OT frequency to follow along status. If not we will cont 1 x weekly thru mid March and re-assess.    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;Skin integrity;Strength;Pain;Scar mobility;Sensation;Decreased knowledge of use of  DME;Decreased knowledge of precautions    Rehab Potential Good    Clinical Decision Making Several treatment options, min-mod task modification necessary    Comorbidities Affecting Occupational Performance: Presence of comorbidities impacting occupational performance    Modification or Assistance to Complete Evaluation  Min-Moderate modification of tasks or assist with assess necessary to complete eval    OT Frequency 1x / week    OT Duration 6 weeks   1 x weekly for 6 weeks initially w emphasis on teaching LE self care, including simple self MLD, scar massage, terapeutic exercise, skin care and compression. Will monitor circumferential measurements of head and neck for reductions, and extend Rx PRN   OT Treatment/Interventions Self-care/ADL training;Therapeutic exercise;Manual Therapy;Manual lymph drainage;Therapeutic activities;DME and/or AE instruction;Patient/family education;Other (comment)   Consider HOS compression garment aka facioplast garment for submental area   Consulted and Agree with Plan of Care Patient             Patient will benefit from skilled therapeutic intervention in order to improve the following deficits and impairments:   Body Structure / Function / Physical Skills: Edema, Skin integrity, Strength, Pain, Scar mobility, Sensation, Decreased knowledge of use of DME, Decreased knowledge of precautions       Visit Diagnosis: Lymphedema, not elsewhere classified    Problem List Patient Active Problem List   Diagnosis Date Noted   Bilateral carotid artery stenosis 07/20/2021   LLQ pain 02/22/2021   Cancer of parotid gland ([place]) 09/11/2020   Mass of left side of neck 08/22/2020   Constipation 07/09/2020   Abnormal cardiac CT angiography 03/16/2020   Coronary artery disease involving native coronary artery of native heart without angina pectoris 03/11/2020   Mild aortic stenosis 03/11/2020   ICAO (internal carotid artery occlusion), left 07/01/2019   Seasonal  allergic rhinitis due to pollen 09/24/2018  Diabetic neuropathy ([place]) 06/21/2017   Overweight with body mass index (BMI) of 28 to 28.9 in adult 06/21/2017   Diabetic retinopathy associated with type 2 diabetes mellitus ([place]) 03/12/2015   Counseling regarding advanced care planning and goals of care 12/08/2014   Diabetes mellitus with ophthalmic complication ([place]) 64/33/2951   Renal insufficiency 11/25/2010   Testicular hypofunction 11/17/2009   Hyperlipidemia associated with type 2 diabetes mellitus ([place]) 07/05/2007   CARCINOMA, BASAL CELL, FACE 07/03/2007   Hypertension associated with diabetes ([place]) 07/03/2007    Andrey Spearman, MS, OTR/L, CLT-LANA 08/04/21 4:07 PM    [place] [facility] SERVICES [place], [place], [address] Phone: [phone]   Fax:  [phone]  Name: Miguel Bradley MRN: [id number] Date of Birth: 08/30/1940

## 2021-08-08 ENCOUNTER — Ambulatory Visit: Payer: PPO | Admitting: Occupational Therapy

## 2021-08-10 ENCOUNTER — Encounter: Payer: PPO | Admitting: Occupational Therapy

## 2021-08-15 ENCOUNTER — Other Ambulatory Visit: Payer: Self-pay

## 2021-08-15 ENCOUNTER — Ambulatory Visit: Payer: PPO | Attending: Internal Medicine | Admitting: Occupational Therapy

## 2021-08-15 DIAGNOSIS — I890 Lymphedema, not elsewhere classified: Secondary | ICD-10-CM | POA: Diagnosis not present

## 2021-08-16 ENCOUNTER — Ambulatory Visit (INDEPENDENT_AMBULATORY_CARE_PROVIDER_SITE_OTHER): Payer: PPO

## 2021-08-16 DIAGNOSIS — I6523 Occlusion and stenosis of bilateral carotid arteries: Secondary | ICD-10-CM

## 2021-08-16 DIAGNOSIS — I350 Nonrheumatic aortic (valve) stenosis: Secondary | ICD-10-CM

## 2021-08-16 MED ORDER — PERFLUTREN LIPID MICROSPHERE
1.0000 mL | INTRAVENOUS | Status: AC | PRN
  Administered 2021-08-16: 2 mL via INTRAVENOUS

## 2021-08-16 NOTE — Therapy (Signed)
[place] MAIN [facility] SERVICES [address] [place], [place], [address] Phone: [phone]   Fax:  [phone]  Occupational Therapy Treatment  Patient Details  Name: Miguel Bradley MRN: [id number] Date of Birth: September 08, 1940 Referring Provider (OT): Cammie Sickle, MD   Encounter Date: 08/15/2021   OT End of Session - 08/16/21 0904     Visit Number 12    Number of Visits 36    Date for OT Re-Evaluation 08/08/21    OT Start Time 0300    OT Stop Time 0400    OT Time Calculation (min) 60 min    Activity Tolerance Patient tolerated treatment well;No increased pain    Behavior During Therapy [facility] for tasks assessed/performed             Past Medical History:  Diagnosis Date   Anemia    Basal cell carcinoma of face    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 Lucio Edward   LEFT HEART CATH AND CORONARY ANGIOGRAPHY Left 03/16/2020   Procedure: LEFT HEART CATH AND CORONARY ANGIOGRAPHY;  Surgeon: Nelva Bush, MD;  Location: [place] [facility];  Service: Cardiovascular;  Laterality: Left;   PARATHYROIDECTOMY  2012   PAROTIDECTOMY W/ NECK DISSECTION TOTAL Left 08/2020   [facility]   skin cancer removal     Dr. Sharlett Iles   TRIGGER FINGER RELEASE Left 2008    There were no vitals filed for this visit.   Subjective Assessment - 08/16/21 0910     Subjective  Miguel Bradley presents for OT  Rx visit 12/36 to address L head and neck lymphedema 2/2 surgical intervention and XRT for salivary duct carcinoma. Miguel Bradley  denies LR related pain today. 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    Pertinent History S/p 07/13/10 RIGHT PARATHYROIDECTOMY, 08/24/20 L TOTAL PAROTIDECTOMY ; Hx Parotid gland Ca, LN levels 1-5: metastatic carcinoma 11/26 LN. Lateral temporal bone resection and reconstruction with L anterolateral thigh flap. 11/24/20 Completed adjuvant XRT at [facility], 02/03/21- present Began Eligard for androgen deprovation therapy. DM, Diabetic neuropathy, CAD, Basal Cell Carcinoma , face, HTN, Renal insufficiency, ICAO-Left, mild aortic stenosis, glaucoma    Limitations weakness and paralysis of facial muscles-L , xerostomia, taste dysfunction, head forwared posture, neck tightness w/ decreased AROM, chronic head / neck swelling    Repetition Increases Symptoms    Special Tests Intake FOTO: 98/100    Pain Onset More than a month ago   s/p cancer surgery and XRT                         OT Treatments/Exercises (OP) - 08/16/21 0912       ADLs   ADL Education Given Yes      Manual Therapy  Manual Therapy Edema management;Manual Lymphatic Drainage (MLD)    Manual Lymphatic Drainage (MLD) MLD to left head and neck avoiding deep strokes to carotid region in keeping with precautions for this patient. Directed lymph congestion using  proximal to distal method  working segmentally back wards from L axila  to chest, then from clavicular region, then from chin, submental and jawline towards clavicle,  then stimulating pre and post auricular nodes,  and finally using light J strokes accross face towards ears.. Unfortunately we did not have  time to perform fibrosis techniques or scar massage, but was able to demonstrate and review "marching soldiers" for Pt and friend.                     OT Education - 08/16/21 0912     Education Details Continued Pt/ CG edu for lymphedema self care  and home program throughout session. Topics include compression options, simple self-MLD, therapeutic lymphatic pumping exercises, skin/nail care, risk reduction factors and LE precautions. Emphasis on Pt and sig other training for lymphatic map of head and neck and simplified sequencing for self MLD using axillary watershed. All questions answered to the Pt's satisfaction, and Pt demonstrates understanding by report.    Person(s) Educated Patient    Methods Explanation;Demonstration;Handout    Comprehension Verbalized understanding;Returned demonstration                 OT Long Term Goals - 07/18/21 1615       OT LONG TERM GOAL #1   Title Pt will be modified independent with all lymphedema home program companents using printed reference for rference, including simple self-MLD, therapeutic pumping exercises, stretching and AROM ther ex, skin care  and compression PRN.    Baseline Max A    Time 6    Period Days    Status Achieved    Target Date --   6th OT Rx visit     OT LONG TERM GOAL #2   Title Given this patients Intake score of TBA/100 on the functional outcomes FOTO tool, patient will experience an increase in function of 3 points to improve basic and instrumental ADLs performance, including lymphedema self-care.    Baseline TBA Rx visit 1    Time 12    Period Weeks    Status On-going    Target Date 10/16/21      OT LONG TERM GOAL #3   Title Pt will demonstrate a 5% girth reduction at upper and lower neck landmarks and at circumferential measurement of head (under chin) to limit LE progression and to regain maximum AROM .   10th visit 07/18/21: Partially met w/ 15.95% reduction in girth of lower neck since commencing OT in Nov 2022.   Baseline MaX A    Time 12    Period Weeks    Status Partially Met    Target Date 10/16/21      OT LONG TERM  GOAL #4   Title Pt will be independent with head and upper body postural corrections by DC to maximise breathing and limit negative impact on balance.    Baseline Max A    Time 12    Period Weeks    Status Achieved    Target Date 08/08/21      OT LONG TERM GOAL #5   Title Pt will demonstrate knowlwdge and understanding of lymphedema precautions and prevention strategies by discussing 5 precautions/ strategies using a printed reference ( modified independent)  to imit  progression of chronic lymphedema.    Baseline Max A    Time 6    Period Days    Status Achieved    Target Date --   6th LE Rx visit                  Plan - 08/16/21 0905     Clinical Impression Statement Reviewed simple self-MLD throughout session while providing manual therapy. Pt practiced strokes and sequences with ongoing verbal and tactile cues. Reviewed scar massage and fibrosis techniques. Pt encouraged to perform MLD, skin care, ther ex daily. We discussed sometimes exacerbating effect of hot weather on lymphatic swelling and sensory symptoms. Because he has reached clinical plateau re progress towards goals and tissue response to therapy he agrees with plan to reduce Rx frequncy and return in 6 weeks for a follow along. Pt will call PRN during interval. Pt transitions to self-management phase of CDT this date. Cont as per POC.    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;Skin integrity;Strength;Pain;Scar mobility;Sensation;Decreased knowledge of use of DME;Decreased knowledge of precautions    Rehab Potential Good    Clinical Decision Making Several treatment options, min-mod task modification  necessary    Comorbidities Affecting Occupational Performance: Presence of comorbidities impacting occupational performance    Modification or Assistance to Complete Evaluation  Min-Moderate modification of tasks or assist with assess necessary to complete eval    OT Frequency 1x / week    OT Duration 6 weeks   1 x weekly for 6 weeks initially w emphasis on teaching LE self care, including simple self MLD, scar massage, terapeutic exercise, skin care and compression. Will monitor circumferential measurements of head and neck for reductions, and extend Rx PRN   OT Treatment/Interventions Self-care/ADL training;Therapeutic exercise;Manual Therapy;Manual lymph drainage;Therapeutic activities;DME and/or AE instruction;Patient/family education;Other (comment)   Consider HOS compression garment aka facioplast garment for submental area   Consulted and Agree with Plan of Care Patient             Patient will benefit from skilled therapeutic intervention in order to improve the following deficits and impairments:   Body Structure / Function / Physical Skills: Edema, Skin integrity, Strength, Pain, Scar mobility, Sensation, Decreased knowledge of use of DME, Decreased knowledge of precautions       Visit Diagnosis: Lymphedema, not elsewhere classified    Problem List Patient Active Problem List   Diagnosis Date Noted   Bilateral carotid artery stenosis 07/20/2021   LLQ pain 02/22/2021   Cancer of parotid gland ([place]) 09/11/2020   Mass of left side of neck 08/22/2020   Constipation 07/09/2020   Abnormal cardiac CT angiography 03/16/2020   Coronary artery disease involving native coronary artery of native heart without angina pectoris 03/11/2020   Mild aortic stenosis 03/11/2020   ICAO (internal carotid artery occlusion), left 07/01/2019   Seasonal allergic rhinitis due to pollen 09/24/2018   Diabetic neuropathy ([place]) 06/21/2017   Overweight with body mass index (BMI) of 28 to 28.9 in  adult 06/21/2017   Diabetic retinopathy associated with type 2 diabetes mellitus ([place]) 03/12/2015   Counseling regarding advanced care planning and goals of care 12/08/2014   Diabetes mellitus with ophthalmic complication ([place]) 46/80/3212   Renal insufficiency  11/25/2010   Testicular hypofunction 11/17/2009   Hyperlipidemia associated with type 2 diabetes mellitus ([place]) 07/05/2007   CARCINOMA, BASAL CELL, FACE 07/03/2007   Hypertension associated with diabetes ([place]) 07/03/2007   Andrey Spearman, MS, OTR/L, [facility] 08/16/21 9:13 AM   [place] MAIN [facility] SERVICES [place], [place], [address] Phone: [phone]   Fax:  [phone]  Name: JUANDIEGO KOLENOVIC MRN: [id number] Date of Birth: Nov 16, 1940

## 2021-08-17 LAB — ECHOCARDIOGRAM COMPLETE
AR max vel: 2.19 cm2
AV Area VTI: 2.08 cm2
AV Area mean vel: 2.13 cm2
AV Mean grad: 11.5 mmHg
AV Peak grad: 20.1 mmHg
Ao pk vel: 2.24 m/s
Area-P 1/2: 5.97 cm2
S' Lateral: 3 cm

## 2021-08-22 ENCOUNTER — Encounter: Payer: PPO | Admitting: Occupational Therapy

## 2021-08-25 ENCOUNTER — Telehealth: Payer: Self-pay

## 2021-08-25 NOTE — Progress Notes (Signed)
? ? ?Chronic Care Management ?Pharmacy Assistant  ? ?Name: Miguel Bradley  MRN: [id number] DOB: 1941-05-31 ? ?Reason for Encounter: CCM (Hypertension Disease State) ?  ?Recent office visits:  ?08/02/2021 - Eliezer Lofts, MD - Diabetes - Abnormal Labs: No provider notes. Stop due to patient not taking: IRON PO. No med changes.  ? ?Recent consult visits:  ?08/01/2021 - Charlaine Dalton, MD - Oncology - Infusion: Sharyn Dross injection 45 mg ?08/01/2021 - Noreene Filbert, MD - Oncology - [place] of parotid gland. 8 months out with no evidence of disease.  ?07/20/2021 Nelva Bush, MD - Cardiology - Coronary artery disease. Ordered: US Carotid, Echocardiogram and EKG. No med changes.  ?06/23/2021 - Jacques Navy, MD - Facial nerve check. No med changes. ?06/01/2021 - PET CT Skull Base to Thigh ? ?Occupation Therapy Visits: ?05/24/2021 - 08/15/2021 ? ?Hospital visits:  ?None in previous 6 months ? ?Medications: ?Outpatient Encounter Medications as of 08/25/2021  ?Medication Sig  ? aspirin EC 81 MG tablet Take 81 mg by mouth daily.  ? dorzolamide-timolol (COSOPT) 22.3-6.8 MG/ML ophthalmic solution Place 1 drop into both eyes 2 (two) times daily.   ? fenofibrate 160 MG tablet TAKE 1 TABLET BY MOUTH EVERY DAY  ? ferrous sulfate 325 (65 FE) MG tablet Take 325 mg by mouth daily with breakfast.  ? hydrochlorothiazide (HYDRODIURIL) 25 MG tablet TAKE 1 TABLET BY MOUTH EVERY DAY  ? latanoprost (XALATAN) 0.005 % ophthalmic solution Place 1 drop into both eyes at bedtime.   ? metFORMIN (GLUCOPHAGE) 500 MG tablet TAKE ONE TABLET BY MOUTH TWICE DAILY WITH A MEAL  ? Multiple Vitamin (MULTIVITAMIN) tablet Take 1 tablet by mouth daily.  ? nitroGLYCERIN (NITROSTAT) 0.4 MG SL tablet Place 1 tablet (0.4 mg total) under the tongue every 5 (five) minutes as needed for chest pain.  ? Omega-3 Fatty Acids (FISH OIL) 1000 MG CAPS Take 1 capsule by mouth daily.  ? rosuvastatin (CRESTOR) 20 MG tablet TAKE 1 TABLET BY MOUTH EVERY DAY  ? ?No  facility-administered encounter medications on file as of 08/25/2021.  ? ? ?Recent Office Vitals: ?BP Readings from Last 3 Encounters:  ?08/02/21 128/78  ?08/01/21 (!) 144/76  ?08/01/21 128/66  ? ?Pulse Readings from Last 3 Encounters:  ?08/02/21 (!) 54  ?08/01/21 (!) 52  ?08/01/21 (!) 46  ?  ?Wt Readings from Last 3 Encounters:  ?08/02/21 200 lb (90.7 kg)  ?08/01/21 196 lb 12.8 oz (89.3 kg)  ?08/01/21 197 lb (89.4 kg)  ?  ? ?Kidney Function ?Lab Results  ?Component Value Date/Time  ? CREATININE 1.07 08/01/2021 10:24 AM  ? CREATININE 1.08 04/27/2021 09:34 AM  ? GFR 62.51 12/31/2020 10:03 AM  ? GFRNONAA >60 08/01/2021 10:24 AM  ? GFRAA >60 03/10/2020 04:36 PM  ? ? ?BMP Latest Ref Rng & Units 08/01/2021 04/27/2021 01/12/2021  ?Glucose 70 - 99 mg/dL 116(H) 135(H) 182(H)  ?BUN 8 - 23 mg/dL '20 19 18  '$ ?Creatinine 0.61 - 1.24 mg/dL 1.07 1.08 1.18  ?BUN/Creat Ratio 10 - 24 - - -  ?Sodium 135 - 145 mmol/L 135 136 140  ?Potassium 3.5 - 5.1 mmol/L 3.5 3.6 4.5  ?Chloride 98 - 111 mmol/L 103 101 103  ?CO2 22 - 32 mmol/L '23 24 30  '$ ?Calcium 8.9 - 10.3 mg/dL 9.6 9.0 9.0  ? ? ?Attempted contact with patient 3 times on 03/16, 03/29 and 03/30. Unsuccessful outreach. Will atttempt contact next month. ? ?Current antihypertensive regimen:  ?HCTZ 25 mg - 1 tablet daily ? ?Adherence  Review: ?Is the patient currently on ACE/ARB medication? No ?Does the patient have >5 day gap between last estimated fill dates? No ? ?Star Rating Drugs:  ?Medication:  Last Fill: Day Supply ?Metformin 500 mg 08/14/2021 90 ?Rosuvastatin 20 mg 06/19/2021 90  ? ?Care Gaps: ?Annual wellness visit in last year? No ?Most Recent BP reading: 128/78 on 08/02/2021 ? ?If Diabetic: ?Most recent A1C reading: 6.1 on 08/02/2021 ?Last eye exam / retinopathy screening: Up to date ?Last diabetic foot exam: Up to date ? ?Upcoming appointments: ?No appointments scheduled within the next 30 days. ? ?Charlene Brooke, CPP notified ? ?Marijean Niemann, RMA ?Clinical Pharmacy  Assistant ?[phone] ? ? ? ?

## 2021-08-29 ENCOUNTER — Encounter: Payer: PPO | Admitting: Occupational Therapy

## 2021-08-29 ENCOUNTER — Encounter: Payer: Self-pay | Admitting: Internal Medicine

## 2021-08-30 ENCOUNTER — Other Ambulatory Visit: Payer: Self-pay | Admitting: Otolaryngology

## 2021-08-30 ENCOUNTER — Other Ambulatory Visit (HOSPITAL_COMMUNITY): Payer: Self-pay | Admitting: Otolaryngology

## 2021-08-30 DIAGNOSIS — C760 Malignant neoplasm of head, face and neck: Secondary | ICD-10-CM

## 2021-08-31 DIAGNOSIS — D0472 Carcinoma in situ of skin of left lower limb, including hip: Secondary | ICD-10-CM | POA: Diagnosis not present

## 2021-09-05 ENCOUNTER — Encounter: Payer: PPO | Admitting: Occupational Therapy

## 2021-09-12 ENCOUNTER — Encounter: Payer: PPO | Admitting: Occupational Therapy

## 2021-09-13 ENCOUNTER — Telehealth: Payer: Self-pay

## 2021-09-13 NOTE — Progress Notes (Signed)
? ? ?Chronic Care Management ?Pharmacy Assistant  ? ?Name: Miguel Bradley  MRN: [id number] DOB: 03/10/1941 ? ?Reason for Encounter: CCM (Hypertension Disease State) ?  ?Recent office visits:  ?None since last CCM contact ? ?Recent consult visits:  ?None since last CCM contact ? ?Hospital visits:  ?None in previous 6 months ? ?Medications: ?Outpatient Encounter Medications as of 09/13/2021  ?Medication Sig  ? aspirin EC 81 MG tablet Take 81 mg by mouth daily.  ? dorzolamide-timolol (COSOPT) 22.3-6.8 MG/ML ophthalmic solution Place 1 drop into both eyes 2 (two) times daily.   ? fenofibrate 160 MG tablet TAKE 1 TABLET BY MOUTH EVERY DAY  ? ferrous sulfate 325 (65 FE) MG tablet Take 325 mg by mouth daily with breakfast.  ? hydrochlorothiazide (HYDRODIURIL) 25 MG tablet TAKE 1 TABLET BY MOUTH EVERY DAY  ? latanoprost (XALATAN) 0.005 % ophthalmic solution Place 1 drop into both eyes at bedtime.   ? metFORMIN (GLUCOPHAGE) 500 MG tablet TAKE ONE TABLET BY MOUTH TWICE DAILY WITH A MEAL  ? Multiple Vitamin (MULTIVITAMIN) tablet Take 1 tablet by mouth daily.  ? nitroGLYCERIN (NITROSTAT) 0.4 MG SL tablet Place 1 tablet (0.4 mg total) under the tongue every 5 (five) minutes as needed for chest pain.  ? Omega-3 Fatty Acids (FISH OIL) 1000 MG CAPS Take 1 capsule by mouth daily.  ? rosuvastatin (CRESTOR) 20 MG tablet TAKE 1 TABLET BY MOUTH EVERY DAY  ? ?No facility-administered encounter medications on file as of 09/13/2021.  ? ?Recent Office Vitals: ?BP Readings from Last 3 Encounters:  ?08/02/21 128/78  ?08/01/21 (!) 144/76  ?08/01/21 128/66  ? ?Pulse Readings from Last 3 Encounters:  ?08/02/21 (!) 54  ?08/01/21 (!) 52  ?08/01/21 (!) 46  ?  ?Wt Readings from Last 3 Encounters:  ?08/02/21 200 lb (90.7 kg)  ?08/01/21 196 lb 12.8 oz (89.3 kg)  ?08/01/21 197 lb (89.4 kg)  ?  ?Kidney Function ?Lab Results  ?Component Value Date/Time  ? CREATININE 1.07 08/01/2021 10:24 AM  ? CREATININE 1.08 04/27/2021 09:34 AM  ? GFR 62.51 12/31/2020 10:03  AM  ? GFRNONAA >60 08/01/2021 10:24 AM  ? GFRAA >60 03/10/2020 04:36 PM  ? ? ?  Latest Ref Rng & Units 08/01/2021  ? 10:24 AM 04/27/2021  ?  9:34 AM 01/12/2021  ? 10:19 AM  ?BMP  ?Glucose 70 - 99 mg/dL 116   135   182    ?BUN 8 - 23 mg/dL '20   19   18    '$ ?Creatinine 0.61 - 1.24 mg/dL 1.07   1.08   1.18    ?Sodium 135 - 145 mmol/L 135   136   140    ?Potassium 3.5 - 5.1 mmol/L 3.5   3.6   4.5    ?Chloride 98 - 111 mmol/L 103   101   103    ?CO2 22 - 32 mmol/L '23   24   30    '$ ?Calcium 8.9 - 10.3 mg/dL 9.6   9.0   9.0    ? ?Attempted contact with patient 4 times on 04/05, 04/06, 04/07 and 04/10. Unsuccessful outreach. Will atttempt contact next month. ? ?Current antihypertensive regimen:  ?HCTZ 25 mg - 1 tablet daily ?  ?Adherence Review: ?Is the patient currently on ACE/ARB medication? No ?Does the patient have >5 day gap between last estimated fill dates? No ?  ?Star Rating Drugs:  ?Medication:  Last Fill:         Day Supply ?Metformin 500 mg       08/14/2021      90 ?Rosuvastatin 20 mg    06/19/2021      90         ?  ?Care Gaps: ?Annual wellness visit in last year? No ?Most Recent BP reading: 128/78 on 08/02/2021 ?  ?If Diabetic: ?Most recent A1C reading: 6.1 on 08/02/2021 ?Last eye exam / retinopathy screening: Up to date ?Last diabetic foot exam: Up to date ?  ?Upcoming appointments: ?No appointments scheduled within the next 30 days. ?  ?Charlene Brooke, CPP notified ?  ?Marijean Niemann, RMA ?Clinical Pharmacy Assistant ?[phone] ? ? ? ? ?

## 2021-09-14 ENCOUNTER — Other Ambulatory Visit: Payer: Self-pay | Admitting: Family Medicine

## 2021-09-15 ENCOUNTER — Encounter: Payer: Self-pay | Admitting: Internal Medicine

## 2021-09-16 ENCOUNTER — Encounter: Payer: Self-pay | Admitting: Family Medicine

## 2021-09-19 ENCOUNTER — Ambulatory Visit (HOSPITAL_COMMUNITY)
Admission: RE | Admit: 2021-09-19 | Discharge: 2021-09-19 | Disposition: A | Discharge status: Home / Self Care | Payer: PPO | Source: Ambulatory Visit | Attending: Otolaryngology | Admitting: Otolaryngology

## 2021-09-19 ENCOUNTER — Encounter: Payer: PPO | Admitting: Occupational Therapy

## 2021-09-19 DIAGNOSIS — C760 Malignant neoplasm of head, face and neck: Secondary | ICD-10-CM | POA: Diagnosis not present

## 2021-09-19 DIAGNOSIS — Z85818 Personal history of malignant neoplasm of other sites of lip, oral cavity, and pharynx: Secondary | ICD-10-CM | POA: Diagnosis not present

## 2021-09-19 DIAGNOSIS — I2510 Atherosclerotic heart disease of native coronary artery without angina pectoris: Secondary | ICD-10-CM | POA: Diagnosis not present

## 2021-09-19 DIAGNOSIS — I6529 Occlusion and stenosis of unspecified carotid artery: Secondary | ICD-10-CM | POA: Diagnosis not present

## 2021-09-19 LAB — GLUCOSE, CAPILLARY: Glucose-Capillary: 90 mg/dL (ref 70–99)

## 2021-09-19 MED ORDER — FLUDEOXYGLUCOSE F - 18 (FDG) INJECTION
9.7600 | Freq: Once | INTRAVENOUS | Status: AC
  Administered 2021-09-19: 9.76 via INTRAVENOUS

## 2021-09-21 ENCOUNTER — Ambulatory Visit: Payer: PPO | Admitting: Internal Medicine

## 2021-09-22 DIAGNOSIS — C760 Malignant neoplasm of head, face and neck: Secondary | ICD-10-CM | POA: Diagnosis not present

## 2021-09-26 ENCOUNTER — Encounter: Payer: PPO | Admitting: Occupational Therapy

## 2021-09-28 DIAGNOSIS — C089 Malignant neoplasm of major salivary gland, unspecified: Secondary | ICD-10-CM | POA: Diagnosis not present

## 2021-09-28 DIAGNOSIS — G510 Bell's palsy: Secondary | ICD-10-CM | POA: Diagnosis not present

## 2021-09-29 ENCOUNTER — Other Ambulatory Visit: Payer: Self-pay | Admitting: Family Medicine

## 2021-10-03 ENCOUNTER — Encounter: Payer: PPO | Admitting: Occupational Therapy

## 2021-10-07 ENCOUNTER — Encounter: Payer: Self-pay | Admitting: Internal Medicine

## 2021-10-07 ENCOUNTER — Ambulatory Visit: Payer: PPO | Admitting: Internal Medicine

## 2021-10-07 VITALS — BP 126/60 | HR 62 | Ht 70.0 in | Wt 192.0 lb

## 2021-10-07 DIAGNOSIS — E785 Hyperlipidemia, unspecified: Secondary | ICD-10-CM | POA: Diagnosis not present

## 2021-10-07 DIAGNOSIS — I6522 Occlusion and stenosis of left carotid artery: Secondary | ICD-10-CM

## 2021-10-07 DIAGNOSIS — I10 Essential (primary) hypertension: Secondary | ICD-10-CM | POA: Diagnosis not present

## 2021-10-07 DIAGNOSIS — I350 Nonrheumatic aortic (valve) stenosis: Secondary | ICD-10-CM

## 2021-10-07 DIAGNOSIS — E1169 Type 2 diabetes mellitus with other specified complication: Secondary | ICD-10-CM

## 2021-10-07 DIAGNOSIS — I2510 Atherosclerotic heart disease of native coronary artery without angina pectoris: Secondary | ICD-10-CM | POA: Diagnosis not present

## 2021-10-07 NOTE — Patient Instructions (Signed)

## 2021-10-07 NOTE — Progress Notes (Signed)
? ?Follow-up Outpatient Visit ?Date: 10/07/2021 ? ?Primary Care Provider: ?Jinny Sanders, MD ?[address] ?[place] [address] ? ?Chief Complaint: Follow-up coronary artery disease and aortic stenosis ? ?HPI:  Miguel Bradley is a 81 y.o. male with history of severe single-vessel coronary artery disease (80-90% distal RCA stenosis), mild aortic stenosis, left carotid artery occlusion, hypertension, hyperlipidemia, type 2 diabetes mellitus, and parotid cancer, who presents for follow-up of coronary artery disease.  I last saw him in February, at which time he was most concerned about gait instability and difficulties with depth perception.  Of note, he had difficulty independently closing his left eye following extensive left facial surgery for treatment of his parotid cancer last year.  We agreed to obtain an echocardiogram and carotid Doppler; these studies showed preserved LVEF with stable mild aortic stenosis as well as stable chronic left ICA occlusion, respectively. ? ?Today, Miguel Bradley reports that he is feeling better.  He has not had any further episodes of dizziness or gait instability.  He denies chest pain, shortness of breath, palpitations, and edema.  Home blood pressures are typically well controlled with only rare readings above 366 systolic.  Recent PET/CT did not show any evidence of recurrent malignancy following his head and neck surgery last year. ? ?-------------------------------------------------------------------------------------------------- ? ?Past Medical History:  ?Diagnosis Date  ? Anemia   ? Aortic stenosis   ? Mild by most recent echo in 08/2021  ? Basal cell carcinoma of face   ? Cancer of parotid gland ([place]) 08/2020  ? s/p radical left parotidectomy and neck dissection and radiation  ? Cataract   ? Diabetes mellitus without complication ([place])   ? Erectile dysfunction 06/11/2014  ? Glaucoma   ? Hypercalcemia   ? Hyperparathyroidism, unspecified ([place])   ? Impotence of organic  origin   ? Other testicular hypofunction   ? Pure hyperglyceridemia   ? Special screening for malignant neoplasm of prostate   ? Special screening for malignant neoplasms, colon   ? Unspecified essential hypertension   ? ?Past Surgical History:  ?Procedure Laterality Date  ? COLONOSCOPY  06/13/2018  ? Dr. Lucio Edward  ? LEFT HEART CATH AND CORONARY ANGIOGRAPHY Left 03/16/2020  ? Procedure: LEFT HEART CATH AND CORONARY ANGIOGRAPHY;  Surgeon: Nelva Bush, MD;  Location: [facility];  Service: Cardiovascular;  Laterality: Left;  ? PARATHYROIDECTOMY  2012  ? PAROTIDECTOMY W/ NECK DISSECTION TOTAL Left 08/2020  ? [facility]  ? skin cancer removal    ? Dr. Sharlett Iles  ? TRIGGER FINGER RELEASE Left 2008  ? ? ? ?Recent CV Pertinent Labs: ?Lab Results  ?Component Value Date  ? CHOL 131 12/31/2020  ? HDL 37.10 (L) 12/31/2020  ? LDLCALC 54 11/28/2013  ? LDLDIRECT 70.0 12/31/2020  ? TRIG 214.0 (H) 12/31/2020  ? CHOLHDL 4 12/31/2020  ? K 3.5 08/01/2021  ? BUN 20 08/01/2021  ? BUN 17 02/23/2020  ? CREATININE 1.07 08/01/2021  ? ? ?Past medical and surgical history were reviewed and updated in EPIC. ? ?Current Meds  ?Medication Sig  ? aspirin EC 81 MG tablet Take 81 mg by mouth daily.  ? dorzolamide-timolol (COSOPT) 22.3-6.8 MG/ML ophthalmic solution Place 1 drop into both eyes 2 (two) times daily.   ? fenofibrate 160 MG tablet TAKE 1 TABLET BY MOUTH EVERY DAY  ? hydrochlorothiazide (HYDRODIURIL) 25 MG tablet TAKE 1 TABLET BY MOUTH EVERY DAY  ? latanoprost (XALATAN) 0.005 % ophthalmic solution Place 1 drop into both eyes at  bedtime.   ? metFORMIN (GLUCOPHAGE) 500 MG tablet TAKE ONE TABLET BY MOUTH TWICE DAILY WITH A MEAL  ? Multiple Vitamin (MULTIVITAMIN) tablet Take 1 tablet by mouth daily.  ? nitroGLYCERIN (NITROSTAT) 0.4 MG SL tablet Place 1 tablet (0.4 mg total) under the tongue every 5 (five) minutes as needed for chest pain.  ? Omega-3 Fatty Acids (FISH OIL) 1000 MG CAPS Take 1 capsule by mouth daily.  ? rosuvastatin  (CRESTOR) 20 MG tablet TAKE 1 TABLET BY MOUTH EVERY DAY  ? ? ?Allergies: Patient has no known allergies. ? ?Social History  ? ?Tobacco Use  ? Smoking status: Former  ?  Years: 25.00  ?  Types: Cigarettes  ?  Quit date: 06/12/1978  ?  Years since quitting: 43.3  ? Smokeless tobacco: Never  ?Vaping Use  ? Vaping Use: Never used  ?Substance Use Topics  ? Alcohol use: Not Currently  ?  Comment: 1 beer every few weeks  ? Drug use: No  ? ? ?Family History  ?Problem Relation Age of Onset  ? Hypertension Mother   ? Dementia Mother   ? Diabetes Other   ?     aunt  ? Hypertension Brother   ? Hypertension Brother   ? Hypertension Brother   ? Hyperlipidemia Brother   ? Hyperlipidemia Brother   ? Hyperlipidemia Brother   ? Hypertension Sister   ? Bladder Cancer Sister   ? Hyperlipidemia Sister   ? Colon cancer Neg Hx   ? Esophageal cancer Neg Hx   ? Rectal cancer Neg Hx   ? Stomach cancer Neg Hx   ? ? ?Review of Systems: ?A 12-system review of systems was performed and was negative except as noted in the HPI. ? ?-------------------------------------------------------------------------------------------------- ? ?Physical Exam: ?BP 126/60 (BP Location: Left Arm, Patient Position: Sitting, Cuff Size: Normal)   Pulse 62   Ht '5\' 10"'$  (1.778 m)   Wt 192 lb (87.1 kg)   SpO2 98%   BMI 27.55 kg/m?  ? ?General:  NAD. ?Neck: No JVD or HJR. ?Lungs: Clear to auscultation bilaterally without wheezes or crackles. ?Heart: Regular rate and rhythm with 2/6 systolic murmur.  No rubs or gallops.Marland Kitchen ?Abdomen: Soft, nontender, nondistended. ?Extremities: No lower extremity edema. ? ?Lab Results  ?Component Value Date  ? WBC 6.8 08/01/2021  ? HGB 15.0 08/01/2021  ? HCT 45.4 08/01/2021  ? MCV 93.8 08/01/2021  ? PLT 258 08/01/2021  ? ? ?Lab Results  ?Component Value Date  ? NA 135 08/01/2021  ? K 3.5 08/01/2021  ? CL 103 08/01/2021  ? CO2 23 08/01/2021  ? BUN 20 08/01/2021  ? CREATININE 1.07 08/01/2021  ? GLUCOSE 116 (H) 08/01/2021  ? ALT 16 08/01/2021   ? ? ?Lab Results  ?Component Value Date  ? CHOL 131 12/31/2020  ? HDL 37.10 (L) 12/31/2020  ? LDLCALC 54 11/28/2013  ? LDLDIRECT 70.0 12/31/2020  ? TRIG 214.0 (H) 12/31/2020  ? CHOLHDL 4 12/31/2020  ? ? ?-------------------------------------------------------------------------------------------------- ? ?ASSESSMENT AND PLAN: ?Coronary artery disease: ?Miguel Bradley does not have any symptoms to suggest worsening coronary insufficiency in the setting of his severe single-vessel coronary artery disease involving the RCA.  We will continue aggressive medical therapy to prevent progression of disease including aspirin, rosuvastatin, fenofibrate, and omega-3 fatty acids.  Most recent lipid panel in 12/2020 showed reasonable LDL of 70 and mildly elevated triglycerides at 214. ? ?Aortic stenosis: ?Mild aortic stenosis again seen on recent echo.  Continue clinical follow-up and consider  repeat echo in 3-5 years. ? ?Carotid artery occlusion: ?Chronic occlusion of the left internal carotid artery again noted.  Mild plaque seen on the right.  No symptoms reported by Mr. Miguel Bradley.  Continue aspirin and aggressive lipid therapy with plans to repeat a carotid Doppler in about 1 year. ? ?Hyperlipidemia associated with type 2 diabetes mellitus: ?As above, continue current combination of rosuvastatin, fenofibrate, and omega-3 fatty acids.  Ongoing management of DM per Dr. Diona Browner. ? ?Hypertension: ?Blood pressure well controlled today and typically quite good at home as well.  Continue HCTZ. ? ?Follow-up: Return to clinic in 6 months. ? ?Nelva Bush, MD ?10/07/2021 ?9:39 AM ? ?

## 2021-10-10 ENCOUNTER — Encounter: Payer: PPO | Admitting: Occupational Therapy

## 2021-10-12 ENCOUNTER — Ambulatory Visit: Payer: PPO | Admitting: Occupational Therapy

## 2021-10-17 ENCOUNTER — Ambulatory Visit: Payer: PPO | Admitting: Occupational Therapy

## 2021-10-17 DIAGNOSIS — H40153 Residual stage of open-angle glaucoma, bilateral: Secondary | ICD-10-CM | POA: Diagnosis not present

## 2021-10-17 LAB — HM DIABETES EYE EXAM

## 2021-10-19 ENCOUNTER — Ambulatory Visit: Payer: PPO | Admitting: Occupational Therapy

## 2021-10-21 ENCOUNTER — Other Ambulatory Visit: Payer: Self-pay | Admitting: Internal Medicine

## 2021-10-21 DIAGNOSIS — I6523 Occlusion and stenosis of bilateral carotid arteries: Secondary | ICD-10-CM

## 2021-10-24 ENCOUNTER — Telehealth: Payer: Self-pay

## 2021-10-24 ENCOUNTER — Inpatient Hospital Stay: Payer: PPO | Admitting: Internal Medicine

## 2021-10-24 ENCOUNTER — Encounter: Payer: Self-pay | Admitting: Internal Medicine

## 2021-10-24 ENCOUNTER — Telehealth: Payer: Self-pay | Admitting: Family Medicine

## 2021-10-24 ENCOUNTER — Inpatient Hospital Stay: Payer: PPO | Attending: Internal Medicine

## 2021-10-24 ENCOUNTER — Ambulatory Visit: Payer: PPO | Admitting: Occupational Therapy

## 2021-10-24 DIAGNOSIS — Z85818 Personal history of malignant neoplasm of other sites of lip, oral cavity, and pharynx: Secondary | ICD-10-CM | POA: Diagnosis not present

## 2021-10-24 DIAGNOSIS — Z7984 Long term (current) use of oral hypoglycemic drugs: Secondary | ICD-10-CM | POA: Insufficient documentation

## 2021-10-24 DIAGNOSIS — Z79899 Other long term (current) drug therapy: Secondary | ICD-10-CM | POA: Diagnosis not present

## 2021-10-24 DIAGNOSIS — N183 Chronic kidney disease, stage 3 unspecified: Secondary | ICD-10-CM | POA: Insufficient documentation

## 2021-10-24 DIAGNOSIS — Z87891 Personal history of nicotine dependence: Secondary | ICD-10-CM | POA: Insufficient documentation

## 2021-10-24 DIAGNOSIS — Z7982 Long term (current) use of aspirin: Secondary | ICD-10-CM | POA: Insufficient documentation

## 2021-10-24 DIAGNOSIS — I2510 Atherosclerotic heart disease of native coronary artery without angina pectoris: Secondary | ICD-10-CM | POA: Insufficient documentation

## 2021-10-24 DIAGNOSIS — C07 Malignant neoplasm of parotid gland: Secondary | ICD-10-CM | POA: Diagnosis not present

## 2021-10-24 DIAGNOSIS — E11319 Type 2 diabetes mellitus with unspecified diabetic retinopathy without macular edema: Secondary | ICD-10-CM

## 2021-10-24 DIAGNOSIS — I890 Lymphedema, not elsewhere classified: Secondary | ICD-10-CM

## 2021-10-24 DIAGNOSIS — E1122 Type 2 diabetes mellitus with diabetic chronic kidney disease: Secondary | ICD-10-CM | POA: Insufficient documentation

## 2021-10-24 LAB — CBC WITH DIFFERENTIAL/PLATELET
Abs Immature Granulocytes: 0.02 10*3/uL (ref 0.00–0.07)
Basophils Absolute: 0.1 10*3/uL (ref 0.0–0.1)
Basophils Relative: 1 %
Eosinophils Absolute: 0.2 10*3/uL (ref 0.0–0.5)
Eosinophils Relative: 4 %
HCT: 41.3 % (ref 39.0–52.0)
Hemoglobin: 13.6 g/dL (ref 13.0–17.0)
Immature Granulocytes: 0 %
Lymphocytes Relative: 21 %
Lymphs Abs: 1.2 10*3/uL (ref 0.7–4.0)
MCH: 30.2 pg (ref 26.0–34.0)
MCHC: 32.9 g/dL (ref 30.0–36.0)
MCV: 91.6 fL (ref 80.0–100.0)
Monocytes Absolute: 0.5 10*3/uL (ref 0.1–1.0)
Monocytes Relative: 9 %
Neutro Abs: 3.6 10*3/uL (ref 1.7–7.7)
Neutrophils Relative %: 65 %
Platelets: 228 10*3/uL (ref 150–400)
RBC: 4.51 MIL/uL (ref 4.22–5.81)
RDW: 13.2 % (ref 11.5–15.5)
WBC: 5.5 10*3/uL (ref 4.0–10.5)
nRBC: 0 % (ref 0.0–0.2)

## 2021-10-24 LAB — COMPREHENSIVE METABOLIC PANEL
ALT: 18 U/L (ref 0–44)
AST: 25 U/L (ref 15–41)
Albumin: 3.8 g/dL (ref 3.5–5.0)
Alkaline Phosphatase: 52 U/L (ref 38–126)
Anion gap: 7 (ref 5–15)
BUN: 24 mg/dL — ABNORMAL HIGH (ref 8–23)
CO2: 23 mmol/L (ref 22–32)
Calcium: 8.9 mg/dL (ref 8.9–10.3)
Chloride: 104 mmol/L (ref 98–111)
Creatinine, Ser: 1.23 mg/dL (ref 0.61–1.24)
GFR, Estimated: 59 mL/min — ABNORMAL LOW (ref >60)
Glucose, Bld: 139 mg/dL — ABNORMAL HIGH (ref 70–99)
Potassium: 3.6 mmol/L (ref 3.5–5.1)
Sodium: 134 mmol/L — ABNORMAL LOW (ref 135–145)
Total Bilirubin: 0.6 mg/dL (ref 0.3–1.2)
Total Protein: 7.1 g/dL (ref 6.5–8.1)

## 2021-10-24 MED ORDER — LIDOCAINE-PRILOCAINE 2.5-2.5 % EX CREA: TOPICAL_CREAM | CUTANEOUS | 3 refills | Status: DC

## 2021-10-24 NOTE — Assessment & Plan Note (Addendum)
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 Stage: pT4aN3b-stage IVb.  HER-2 positive; androgen receptor positive.] Patient currently s/p adjuvant radiation-[finished July 16th. 2022]. April 2023- PET scan- Clinically no evidence of recurrence;  Stable postsurgical changes in the left neck status post parotidectomy and nodal dissection. No evidence of local recurrence or metastatic disease;  No suspicious hypermetabolic activity in the chest, abdomen or pelvis. ?? ?# CKD- stage III-  [1 BID; tylenol]- STABLE.  ? ?# pain from eliagrd- plan prior emla cream.  Emla cream prescribed. ? ?#Lymphedema neck s/p surgery-s/p  lymphedema treatment/prevention- STABLE.  ? ?#Incidental findings on Imaging  PET scan, 2023: Coronary and Aortic Atherosclerosis; aortic valvular calcifications. I reviewed/discussed/counseled the patient.  ? ?# DISPOSITION:  ?# follow up in 3 months- MD labs- cbc/cmp;Eligard -Dr.B ? ?# I reviewed the blood work- with the patient in detail; also reviewed the imaging independently [as summarized above]; and with the patient in detail.  ? ? ? ? ? ?

## 2021-10-24 NOTE — Progress Notes (Signed)
[facility] ?CONSULT NOTE ? ?Patient Care Team: ?Bedsole, Amy E, MD as PCP - General ?End, Christopher, MD as PCP - Cardiology (Cardiology) ?Matthews, John D, MD as Consulting Physician (Ophthalmology) ?Bell, Phillip T., MD as Referring Physician (Ophthalmology) ?Isenstein, Arin L, MD as Referring Physician (Dermatology) ?Stark, Malcolm T, MD as Consulting Physician (Gastroenterology) ?Hargis, Gerald W., MD as Referring Physician (Dentistry) ?Adams, Michelle, RPH as Pharmacist (Pharmacist) ?Brahmanday, Govinda R, MD as Consulting Physician (Hematology and Oncology) ? ?CHIEF COMPLAINTS/PURPOSE OF CONSULTATION: parotid cancer ? ?Oncology History Overview Note  ?Addendum    ?Upon clinician request, additional immunostains are performed at [facility] on block F2, with the following results in the tumor cells: ?  ?HER2 ([place], clone [id number]) ?Interpretation: Positive ?IHC Score: 3+ (using the ASCO/CAP breast biomarker scoring guidelines; there are no consensus guideline for scoring HER2 in salivary duct carcinoma) ?  ?This slide has also been reviewed by Dr. S. O'Connor ([facility] breast pathology) who concurs.  ?Addendum electronically signed by Steven Michael Johnson, MD on 09/03/2020 at  3:21 PM  ?Diagnosis    ?A. "Tissue around stylomastoid foramen"  ?- Fibrovascular tissue extensively involved by carcinoma ?  ?B. "Main trunk facial nerve"  ?- Nerve and fibrous tissue extensively involved by carcinoma ?    ?C. "Distal facial nerve" ?- Nerve tissue; negative for carcinoma ?    ?D. "Proximal mastoid nerve margin" ?- Nerve tissue; negative for carcinoma ?  ?E: Left upper superficial parotid, parotidectomy ?- Benign parotid parenchyma with focal acute and chronic inflammation ?- Negative for carcinoma ?  ?F: Left inferior parotid with sternocleidomastoid muscle, resection ?- High-grade salivary gland carcinoma, most consistent with salivary duct carcinoma (see comment and synoptic report for further details) ?- Cauterized  tumor present at inked deep surgical margin within fibrous tissue adjacent to skeletal muscle ?- Metastatic carcinoma in 1.1 cm nodal conglomerate and 4 of 4 intact lymph nodes (at least 5/5); positive for extranodal extension  ?  ?G: Lymph node, left neck level 5, lymphadenectomy ?- Benign adipose; no lymph nodes or carcinoma identified ?  ?H: Lymph node, left neck level 1, lymphadenectomy ?- Metastatic carcinoma in 1 of 5 lymph nodes (1/5); maximum size of metastasis 2 mm; negative for extranodal extension. ?  ?I: Left omohyoid, excision ?- Benign skeletal muscle; negative for carcinoma ?  ?J: Left deep lobe parotid, excision ?- Benign parotid tissue; negative for carcinoma ?  ?K: Parotid tissue, biopsy ?- Benign parotid tissue ?- No metastatic carcinoma identified in 1 lymph nodes (0/1) ?  ?L: Left tissue over mastoid, biopsy ?- Benign parotid parenchyma, dense fibrous tissue, and bone; negative for carcinoma ?  ?M: "Stylomastoid foramen stitch on proximal nerve", excision ?- Large nerve, negative for perineural/intraneural carcinoma; stitched margin negative ?- Attached fibrous tissue and parotid parenchyma positive for carcinoma, focally invading around but not within large nerve segment, with crushed/cauterized tumor present at tissue edges ?  ?N: Tissue over styloid process, biopsy ?- Benign parotid tissue; negative for carcinoma ?  ?O: Posterior digastric margin, biopsy ?- Benign skeletal muscle; negative for carcinoma ?  ?P: Left neck dissections level 1 through 5, lymphadenectomy ?- Metastatic carcinoma in 11 of 26 lymph nodes (11/26); maximum size of metastasis 20 mm; two nodes (16 mm and 14 mm) positive for extranodal extension (ENEma, up to 4 mm beyond capsule)  ? ?Diagnosis: Salivary duct carcinoma of the left parotid gland ?Stage: pT4aN3b; Treatment/Date Completed: 08/24/20: Left total parotidectomy with facial nerve sacrifice, lateral temporal bone resection,   and reconstruction with left anterolateral  thigh free flap ?Pathology: 4.2 cm salivary duct carcinoma, positive LVI/PNI, 17/36 LN involved with ENE present. ?-----------------------------------------------------------------  ? ?07/13/10: Right parathyroidectomy ?Pathology: Hypercellular parathyroid gland  ? ?-------------------------------------------------------------------------------------   ?# MARCH 2022- CT chest- [[facility]] Subcentimeter pulmonary nodules measuring up to 0.8 cm, indeterminate. No comparison available. Attention on follow-up is recommended. ? 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 Stage: pT4aN3b-stage IVb.  HER-2 positive; androgen receptor positive.]  PET scan-negative for distant metastatic disease.  ? ?# CAD [card]; DM- 2; CKD-III ? ? ?  ?Cancer of parotid gland ([place])  ?09/11/2020 Initial Diagnosis  ? Cancer of parotid gland [facility]) ?  ?09/16/2020 Cancer Staging  ? Staging form: Major Salivary Glands, AJCC 8th Edition ?- Pathologic: Stage IVB (pT4a, pN3b, cM0) - Signed by Cammie Sickle, MD on 09/16/2020 ? ?  ? ?HISTORY OF PRESENTING ILLNESS: Accompanied by his daughter ambulating independently. ? ?Miguel Bradley 81 y.o.  male pleasant patient with locally advanced high-grade salivary gland/parotid cancer HER2 positive;AR positive-T4AN3-stage IVb is currently s/p adjuvant radiation; on Eligard q6M is here for follow-up/review results of the PET scan. ? ?Denies any weight loss.  No hot flashes.  Denies any significant joint pains or bone pain.  Chronic mild fatigue.  Denies any worsening swelling in the neck.  S/p physical therapy. ? ?Review of Systems  ?Constitutional:  Positive for malaise/fatigue. Negative for chills, diaphoresis and fever.  ?HENT:  Negative for  nosebleeds and sore throat.   ?Eyes:  Negative for double vision.  ?Respiratory:  Negative for cough, hemoptysis, sputum production, shortness of breath and wheezing.   ?Cardiovascular:  Negative for chest pain, palpitations, orthopnea and leg swelling.  ?Gastrointestinal:  Negative for abdominal pain, blood in stool, constipation, diarrhea, heartburn, melena, nausea and vomiting.  ?Genitourinary:  Negative for dysuria, frequency and urgency.  ?Musculoskeletal:  Positive for joint pain. Negative for back pain.  ?Skin: Negative.  Negative for itching and rash.  ?Neurological:  Negative for dizziness, tingling, focal weakness, weakness and headaches.  ?Endo/Heme/Allergies:  Does not bruise/bleed easily.  ?Psychiatric/Behavioral:  Negative for depression. The patient is not nervous/anxious and does not have insomnia.    ? ?MEDICAL HISTORY:  ?Past Medical History:  ?Diagnosis Date  ?? Anemia   ?? Aortic stenosis   ? Mild by most recent echo in 08/2021  ?? Basal cell carcinoma of face   ?? Cancer of parotid gland ([place]) 08/2020  ? s/p radical left parotidectomy and neck dissection and radiation  ?? Cataract   ?? Diabetes mellitus without complication ([place])   ?? Erectile dysfunction 06/11/2014  ?? Glaucoma   ?? Hypercalcemia   ?? Hyperparathyroidism, unspecified ([place])   ?? Impotence of organic origin   ?? Other testicular hypofunction   ?? Pure hyperglyceridemia   ?? Special screening for malignant neoplasm of prostate   ?? Special screening for malignant neoplasms, colon   ?? Unspecified essential hypertension   ? ? ?SURGICAL HISTORY: ?Past Surgical History:  ?Procedure Laterality Date  ?? COLONOSCOPY  06/13/2018  ? Dr. Lucio Edward  ?? LEFT HEART CATH AND CORONARY ANGIOGRAPHY Left 03/16/2020  ? Procedure: LEFT HEART CATH AND CORONARY ANGIOGRAPHY;  Surgeon: Nelva Bush, MD;  Location: [facility];  Service: Cardiovascular;  Laterality: Left;  ??  PARATHYROIDECTOMY  2012  ?? PAROTIDECTOMY W/ NECK DISSECTION  TOTAL Left 08/2020  ? [facility]  ?? skin cancer removal    ? Dr. Sharlett Iles  ?? TRIGGER FINGER RELEASE Left 2008  ? ? ?SOCIAL HISTORY: ?Social History  ? ?Socioeconomic History  ?? Marital status: Married  ?  Sp

## 2021-10-24 NOTE — Telephone Encounter (Signed)
PA for EMLA cream submitted and approved via Cover My Meds ? ?Approved 15-MAY-23:13-AUG-23 Lidocaine-Prilocaine 2.5-2.5% EX CREA Quantity:30 ?

## 2021-10-24 NOTE — Telephone Encounter (Signed)
-----   Message from Velna Hatchet, RT sent at 10/10/2021  9:23 AM EDT ----- ?Regarding: Lab Tue 10/25/21 ?Patient is scheduled for cpx, please order future labs.  Thanks, Anda Kraft ? ? ?

## 2021-10-25 ENCOUNTER — Other Ambulatory Visit (INDEPENDENT_AMBULATORY_CARE_PROVIDER_SITE_OTHER): Payer: PPO

## 2021-10-25 ENCOUNTER — Ambulatory Visit: Payer: PPO

## 2021-10-25 ENCOUNTER — Other Ambulatory Visit: Payer: PPO

## 2021-10-25 DIAGNOSIS — E11319 Type 2 diabetes mellitus with unspecified diabetic retinopathy without macular edema: Secondary | ICD-10-CM

## 2021-10-25 LAB — LIPID PANEL
Cholesterol: 130 mg/dL (ref 0–200)
HDL: 38.7 mg/dL — ABNORMAL LOW (ref >39.00)
LDL Cholesterol: 52 mg/dL (ref 0–99)
NonHDL: 91
Total CHOL/HDL Ratio: 3
Triglycerides: 194 mg/dL — ABNORMAL HIGH (ref 0.0–149.0)
VLDL: 38.8 mg/dL (ref 0.0–40.0)

## 2021-10-25 LAB — HEMOGLOBIN A1C: Hgb A1c MFr Bld: 6.1 % (ref 4.6–6.5)

## 2021-10-25 NOTE — Progress Notes (Signed)
No critical labs need to be addressed urgently. We will discuss labs in detail at upcoming office visit.   

## 2021-10-26 ENCOUNTER — Ambulatory Visit: Payer: PPO | Admitting: Occupational Therapy

## 2021-10-31 ENCOUNTER — Ambulatory Visit: Payer: PPO | Admitting: Occupational Therapy

## 2021-11-01 ENCOUNTER — Encounter: Payer: Self-pay | Admitting: Family Medicine

## 2021-11-01 ENCOUNTER — Ambulatory Visit (INDEPENDENT_AMBULATORY_CARE_PROVIDER_SITE_OTHER): Payer: PPO | Admitting: Family Medicine

## 2021-11-01 VITALS — BP 110/60 | HR 51 | Temp 97.7°F | Ht 69.25 in | Wt 197.2 lb

## 2021-11-01 DIAGNOSIS — I2510 Atherosclerotic heart disease of native coronary artery without angina pectoris: Secondary | ICD-10-CM

## 2021-11-01 DIAGNOSIS — C07 Malignant neoplasm of parotid gland: Secondary | ICD-10-CM | POA: Diagnosis not present

## 2021-11-01 DIAGNOSIS — I10 Essential (primary) hypertension: Secondary | ICD-10-CM

## 2021-11-01 DIAGNOSIS — E1169 Type 2 diabetes mellitus with other specified complication: Secondary | ICD-10-CM

## 2021-11-01 DIAGNOSIS — E1142 Type 2 diabetes mellitus with diabetic polyneuropathy: Secondary | ICD-10-CM | POA: Diagnosis not present

## 2021-11-01 DIAGNOSIS — E1149 Type 2 diabetes mellitus with other diabetic neurological complication: Secondary | ICD-10-CM

## 2021-11-01 DIAGNOSIS — I6523 Occlusion and stenosis of bilateral carotid arteries: Secondary | ICD-10-CM | POA: Diagnosis not present

## 2021-11-01 DIAGNOSIS — Z0000 Encounter for general adult medical examination without abnormal findings: Secondary | ICD-10-CM

## 2021-11-01 DIAGNOSIS — E785 Hyperlipidemia, unspecified: Secondary | ICD-10-CM | POA: Diagnosis not present

## 2021-11-01 NOTE — Progress Notes (Signed)
Patient ID: Miguel Bradley, male    DOB: [birth year], 81 y.o.   MRN: [id number]  This visit was conducted in person.  BP 110/60   Pulse (!) 51   Temp 97.7 F (36.5 C) (Oral)   Ht 5' 9.25" (1.759 m)   Wt 197 lb 3 oz (89.4 kg)   SpO2 98%   BMI 28.91 kg/m    CC: Chief Complaint  Patient presents with   Medicare Wellness    Subjective:   HPI: Miguel Bradley is a 81 y.o. male presenting on 11/01/2021 for Medicare Wellness  The patient presents for annual medicare wellness, complete physical and review of chronic health problems. He/She also has the following acute concerns today:  I have personally reviewed the Medicare Annual Wellness questionnaire and have noted 1. The patient's medical and social history 2. Their use of alcohol, tobacco or illicit drugs 3. Their current medications and supplements 4. The patient's functional ability including ADL's, fall risks, home safety risks and hearing or visual             impairment. 5. Diet and physical activities 6. Evidence for depression or mood disorders 7.         Updated provider list Cognitive evaluation was performed and recorded on pt medicare questionnaire form. The patients weight, height, BMI and visual acuity have been recorded in the chart   I have made referrals, counseling and provided education to the patient based review of the above and I have provided the pt with a written personalized care plan for preventive services.   Documentation of this information was scanned into the electronic record under the media tab.  No falls in last 12 months.  Cardiac CTA showed multivessel CAD, including FFR positive LAD disease and chronic total occlusion of the mid RCA.  He underwent cardiac catheterization in 03/2020 that showed 80 to 90% distal RCA stenosis and nonobstructive disease in the left coronary artery.  Given that he was totally asymptomatic, they opted to continue with medical therapy.  Followed by cardiology.  Reviewed last OPV from 10/07/21  Cancer of parotid, S/P resection, current on eliguard  Reviewed last OV Oncology Dr. Burlene Arnt  Recent PET scan 4/23  : No evidence of local recurrence or metastatic disease;  No suspicious hypermetabolic activity in the chest, abdomen or pelvis.  Elevated Cholesterol:  LDL at goal < 70 on crestor and fenofibrate Lab Results  Component Value Date   CHOL 130 10/25/2021   HDL 38.70 (L) 10/25/2021   LDLCALC 52 10/25/2021   LDLDIRECT 70.0 12/31/2020   TRIG 194.0 (H) 10/25/2021   CHOLHDL 3 10/25/2021  Using medications without problems: Muscle aches:  Diet compliance: good Exercise: playing golf again. Other complaints:  Carotid stenosis: last doppler per cardiology  scheduled 6/23  Hypertension:    At goal on HCTZ 25 mg daily BP Readings from Last 3 Encounters:  11/01/21 110/60  10/24/21 (!) 147/72  10/07/21 126/60  Using medication without problems or lightheadedness:  none Chest pain with exertion: none Edema:none Short of breath: none Average home BPs: Other issues:  Diabetes:   Improved with weight loss, on metformin 500 mg BID Lab Results  Component Value Date   HGBA1C 6.1 10/25/2021  Using medications without difficulties: Hypoglycemic episodes: Hyperglycemic episodes: Feet problems: none Blood Sugars averaging: not checking eye exam within last year: yes     Wt Readings from Last 3 Encounters:  11/01/21 197 lb 3 oz (89.4 kg)  10/24/21  196 lb 9.6 oz (89.2 kg)  10/07/21 192 lb (87.1 kg)     Relevant past medical, surgical, family and social history reviewed and updated as indicated. Interim medical history since our last visit reviewed. Allergies and medications reviewed and updated. Outpatient Medications Prior to Visit  Medication Sig Dispense Refill   aspirin EC 81 MG tablet Take 81 mg by mouth daily.     dorzolamide-timolol (COSOPT) 22.3-6.8 MG/ML ophthalmic solution Place 1 drop into both eyes 2 (two) times daily.       fenofibrate 160 MG tablet TAKE 1 TABLET BY MOUTH EVERY DAY 90 tablet 0   hydrochlorothiazide (HYDRODIURIL) 25 MG tablet TAKE 1 TABLET BY MOUTH EVERY DAY 90 tablet 1   latanoprost (XALATAN) 0.005 % ophthalmic solution Place 1 drop into both eyes at bedtime.      metFORMIN (GLUCOPHAGE) 500 MG tablet TAKE ONE TABLET BY MOUTH TWICE DAILY WITH A MEAL 180 tablet 1   Multiple Vitamin (MULTIVITAMIN) tablet Take 1 tablet by mouth daily.     nitroGLYCERIN (NITROSTAT) 0.4 MG SL tablet Place 1 tablet (0.4 mg total) under the tongue every 5 (five) minutes as needed for chest pain. 25 tablet prn   Omega-3 Fatty Acids (FISH OIL) 1000 MG CAPS Take 1 capsule by mouth daily.     rosuvastatin (CRESTOR) 20 MG tablet TAKE 1 TABLET BY MOUTH EVERY DAY 90 tablet 0   lidocaine-prilocaine (EMLA) cream Apply on the port. 30 -45 min  prior to injection, 30 g 3   No facility-administered medications prior to visit.     Per HPI unless specifically indicated in ROS section below Review of Systems  Constitutional:  Negative for fatigue and fever.  HENT:  Negative for ear pain.   Eyes:  Negative for pain.  Respiratory:  Negative for cough and shortness of breath.   Cardiovascular:  Negative for chest pain, palpitations and leg swelling.  Gastrointestinal:  Negative for abdominal pain.  Genitourinary:  Negative for dysuria.  Musculoskeletal:  Negative for arthralgias.  Neurological:  Negative for syncope, light-headedness and headaches.  Psychiatric/Behavioral:  Negative for dysphoric mood.   Objective:  BP 110/60   Pulse (!) 51   Temp 97.7 F (36.5 C) (Oral)   Ht 5' 9.25" (1.759 m)   Wt 197 lb 3 oz (89.4 kg)   SpO2 98%   BMI 28.91 kg/m   Wt Readings from Last 3 Encounters:  11/01/21 197 lb 3 oz (89.4 kg)  10/24/21 196 lb 9.6 oz (89.2 kg)  10/07/21 192 lb (87.1 kg)      Physical Exam Constitutional:      General: He is not in acute distress.    Appearance: Normal appearance. He is well-developed. He is not  ill-appearing or toxic-appearing.  HENT:     Head: Normocephalic and atraumatic.     Comments: Post surgical changes to left side of face.. cannot fully close left eye, lip droop and some trouble breathing though ;eft nostril    Right Ear: Hearing, tympanic membrane, ear canal and external ear normal.     Left Ear: Hearing, tympanic membrane, ear canal and external ear normal.     Nose: Nose normal.     Mouth/Throat:     Pharynx: Uvula midline.  Eyes:     General: Lids are normal. Lids are everted, no foreign bodies appreciated.     Conjunctiva/sclera: Conjunctivae normal.     Pupils: Pupils are equal, round, and reactive to light.  Neck:  Thyroid: No thyroid mass or thyromegaly.     Vascular: No carotid bruit.     Trachea: Trachea and phonation normal.     Comments: Abnormal musculature of left neck from surgical changes. Cardiovascular:     Rate and Rhythm: Normal rate and regular rhythm.     Pulses: Normal pulses.     Heart sounds: S1 normal and S2 normal. Heart sounds not distant. No murmur heard.   No friction rub. No gallop.     Comments: No peripheral edema Pulmonary:     Effort: Pulmonary effort is normal. No respiratory distress.     Breath sounds: Normal breath sounds. No wheezing, rhonchi or rales.  Abdominal:     General: Bowel sounds are normal.     Palpations: Abdomen is soft.     Tenderness: There is no abdominal tenderness. There is no guarding or rebound.     Hernia: No hernia is present.  Musculoskeletal:     Cervical back: Normal range of motion and neck supple.  Lymphadenopathy:     Cervical: No cervical adenopathy.  Skin:    General: Skin is warm and dry.     Findings: No rash.  Neurological:     Mental Status: He is alert.     Cranial Nerves: No cranial nerve deficit.     Sensory: No sensory deficit.     Gait: Gait normal.     Deep Tendon Reflexes: Reflexes are normal and symmetric.  Psychiatric:        Speech: Speech normal.        Behavior:  Behavior normal.        Thought Content: Thought content normal.        Judgment: Judgment normal.      Results for orders placed or performed in visit on 11/01/21  HM DIABETES EYE EXAM  Result Value Ref Range   HM Diabetic Eye Exam No Retinopathy No Retinopathy     COVID 19 screen:  No recent travel or known exposure to COVID19 The patient denies respiratory symptoms of COVID 19 at this time. The importance of social distancing was discussed today.   Assessment and Plan The patient's preventative maintenance and recommended screening tests for an annual wellness exam were reviewed in full today. Brought up to date unless services declined.  Counselled on the importance of diet, exercise, and its role in overall health and mortality. The patient's FH and SH was reviewed, including their home life, tobacco status, and drug and alcohol status.    Vaccines:  COVID, PNA, flu and Td uptodate, refuses shingles vaccine...says he has never had chicken pox   Former smoker.Quit 34 years ago.   Colon: 06/2018 Dr. Fuller Plan, tubular adenoma, repeat 5 years if healthy. Prostate: not indicated after age 48.    Has had yearly eye exam.  Problem List Items Addressed This Visit     Coronary artery disease involving native coronary artery of native heart without angina pectoris (Chronic)     Chronic , followed by cardiology       Diabetic neuropathy ([place]) (Chronic)    Chronic, minimally symptomatic.       Bilateral carotid artery stenosis    Chronic, pending dopplers.        Cancer of parotid gland [facility])     Active treatment with Eligard. Reviewed last OV Oncology Dr. Burlene Arnt  Recent PET scan 4/23  : No evidence of local recurrence or metastatic disease;  No suspicious hypermetabolic activity in the chest, abdomen  or pelvis.      Essential hypertension    Stable, chronic.  Continue current medication.  HCTZ 25 mg daily      Hyperlipidemia associated with type 2 diabetes mellitus  (HCC)    Stable, chronic.  Continue current medication.   Crestor 20 mg daily  fenofibrate 160 mg daily      Type 2 diabetes mellitus with neurological complications (HCC)    Chronic, improved control s/p weihgt loss.  Continue metformin 500 mg BID       Other Visit Diagnoses     Medicare annual wellness visit, subsequent    -  Primary       Eliezer Lofts, MD

## 2021-11-01 NOTE — Assessment & Plan Note (Signed)
Chronic, minimally symptomatic.

## 2021-11-01 NOTE — Assessment & Plan Note (Addendum)
Active treatment with Eligard. Reviewed last OV Oncology Dr. Burlene Arnt  Recent PET scan 4/23  : No evidence of local recurrence or metastatic disease;  No suspicious hypermetabolic activity in the chest, abdomen or pelvis.

## 2021-11-01 NOTE — Assessment & Plan Note (Signed)
Chronic, improved control s/p weihgt loss.  Continue metformin 500 mg BID

## 2021-11-01 NOTE — Assessment & Plan Note (Signed)
Stable, chronic.  Continue current medication.   Crestor 20 mg daily  fenofibrate 160 mg daily

## 2021-11-01 NOTE — Patient Instructions (Signed)
Keep working on healthy eating and regular exercise! 

## 2021-11-01 NOTE — Assessment & Plan Note (Signed)
Stable, chronic.  Continue current medication.  HCTZ 25 mg daily 

## 2021-11-01 NOTE — Assessment & Plan Note (Signed)
Chronic, pending dopplers.

## 2021-11-01 NOTE — Assessment & Plan Note (Signed)
Chronic, followed by cardiology 

## 2021-11-02 ENCOUNTER — Ambulatory Visit: Payer: PPO | Admitting: Occupational Therapy

## 2021-11-07 ENCOUNTER — Other Ambulatory Visit: Payer: Self-pay | Admitting: Family Medicine

## 2021-11-09 ENCOUNTER — Ambulatory Visit: Payer: PPO | Admitting: Occupational Therapy

## 2021-11-14 ENCOUNTER — Ambulatory Visit: Payer: PPO | Attending: Internal Medicine | Admitting: Occupational Therapy

## 2021-11-14 DIAGNOSIS — I890 Lymphedema, not elsewhere classified: Secondary | ICD-10-CM | POA: Diagnosis not present

## 2021-11-16 ENCOUNTER — Ambulatory Visit: Payer: PPO | Admitting: Occupational Therapy

## 2021-11-17 NOTE — Therapy (Signed)
[place] MAIN [facility] SERVICES [address] [place], [place], [address] Phone: [phone]   Fax:  [phone]  Occupational Therapy Treatment Note and Progress Report: Head and Neck Lymphedema Care  Patient Details  Name: Miguel Bradley MRN: [id number] Date of Birth: [birth year] Referring Provider (OT): Cammie Sickle, MD   Encounter Date: 11/14/2021   OT End of Session - 11/17/21 0958     Visit Number 13    Number of Visits 36    Date for OT Re-Evaluation 02/12/22    OT Start Time 1105    OT Stop Time 1205    OT Time Calculation (min) 60 min    Activity Tolerance Patient tolerated treatment well;No increased pain    Behavior During Therapy [facility] for tasks assessed/performed             Past Medical History:  Diagnosis Date   Anemia    Aortic stenosis    Mild by most recent echo in 08/2021   Basal cell carcinoma of face    Cancer of parotid gland ([place]) 08/2020   s/p radical left parotidectomy and neck dissection and radiation   Cataract    Diabetes mellitus without complication ([place])    Erectile dysfunction 06/11/2014   Glaucoma    Hypercalcemia    Hyperparathyroidism, unspecified ([place])    Impotence of organic origin    Other testicular hypofunction    Pure hyperglyceridemia    Special screening for malignant neoplasm of prostate    Special screening for malignant neoplasms, colon    Unspecified essential hypertension     Past Surgical History:  Procedure Laterality Date   COLONOSCOPY  06/13/2018   Dr. Lucio Edward   LEFT HEART CATH AND CORONARY ANGIOGRAPHY Left 03/16/2020   Procedure: LEFT HEART CATH AND CORONARY ANGIOGRAPHY;  Surgeon: Nelva Bush, MD;  Location: [place] [facility];  Service: Cardiovascular;  Laterality: Left;   PARATHYROIDECTOMY  2012   PAROTIDECTOMY W/ NECK DISSECTION TOTAL Left 08/2020   [facility]   skin cancer removal     Dr. Sharlett Iles   TRIGGER FINGER RELEASE Left 2008    There were no  vitals filed for this visit.   Subjective Assessment - 11/17/21 0958     Subjective  Miguel Bradley presents for 3 month follow along OT Rx visit to address L head and neck lymphedema 2/2 surgical intervention and XRT for salivary duct carcinoma. Miguel Bradley reports ongoing lymphedema related pain on the L side of his face and neck, including tightness, denies L related pain today. 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    Pertinent History S/p 07/13/10 RIGHT PARATHYROIDECTOMY, 08/24/20 L TOTAL PAROTIDECTOMY ; Hx Parotid gland Ca, LN levels 1-5: metastatic carcinoma 11/26 LN. Lateral temporal bone resection and reconstruction with L anterolateral thigh flap. 11/24/20 Completed adjuvant XRT at [facility], 02/03/21- present Began Eligard for androgen deprovation therapy. DM, Diabetic neuropathy, CAD, Basal Cell Carcinoma , face, HTN, Renal insufficiency, ICAO-Left, mild aortic stenosis, glaucoma    Limitations weakness and paralysis of facial muscles-L , xerostomia, taste dysfunction, head forwared posture, neck tightness w/ decreased AROM, chronic head / neck swelling    Repetition Increases Symptoms    Special Tests Intake FOTO: 98/100    Currently in Pain? No/denies    Pain Onset More than a month ago   s/p cancer surgery and XRT  LYMPHEDEMA/ONCOLOGY QUESTIONNAIRE - 11/17/21 0959       Head and Neck   Other superior neck 38.6 cm. No change s8ince ladst measured on 07/18/21.No change since initial on 05/12/21 (P)     Other Inferior neck 37.4 cm. No change since 07/18/21. Decreased from 44.5 initially, by 7.1 cm. (P)     Other Circ chin to apex of head =69. No change since 07/18/21. 1 cm decreased since initial on 05/12/21+ (P)                      OT Treatments/Exercises (OP) - 11/17/21 0959       ADLs   ADL Education Given Yes                     OT Education - 11/17/21 1336     Education Details Continued Pt/ CG edu for lymphedema self care  and home program throughout session. Topics include compression options, simple self-MLD, therapeutic lymphatic pumping exercises, skin/nail care, risk reduction factors and LE precautions. Emphasis on Pt and sig other training for lymphatic map of head and neck and simplified sequencing for self MLD using axillary watershed. All questions answered to the Pt's satisfaction, and Pt demonstrates understanding by report.    Person(s) Educated Patient    Methods Explanation;Demonstration;Handout    Comprehension Verbalized understanding;Returned demonstration                 OT Long Term Goals - 11/17/21 1326       OT LONG TERM GOAL #1   Title Pt will be modified independent with all lymphedema home program companents using printed reference for rference, including simple self-MLD, therapeutic pumping exercises, stretching and AROM ther ex, skin care  and compression PRN.    Baseline Max A    Time 6    Period Days    Status Achieved    Target Date --   6th OT Rx visit     OT LONG TERM GOAL #2   Title Given this patient's Intake score of 98/100 on the functional outcomes FOTO tool, patient will experience an increase in function of 3 points to improve basic and instrumental ADLs performance, including lymphedema self-care.    Baseline TBA Rx visit 1    Time 12    Period Weeks    Status Not Met   Final FOTO score unchanged at DC- 98.48/100%   Target Date 10/16/21      OT LONG TERM GOAL #3   Title Pt will demonstrate a 5% girth reduction at upper and lower neck landmarks and at circumferential measurement of head (under chin) to limit LE progression and to regain maximum AROM .    Baseline MaX A    Time 12    Period Weeks    Status Partially Met   Achieved for inferior neck circumference onlt with 16% reduction. ISuperior neck and Circ of chin to head apex essentially unchanged snce  07/2021   Target Date --      OT LONG TERM GOAL #4   Title Pt will be independent with head and upper body postural corrections by DC to maximise breathing and limit negative impact on balance.    Baseline Max A    Time 12    Period Weeks    Status Achieved    Target Date --      OT LONG TERM GOAL #5   Title Pt will demonstrate knowlwdge and understanding of  lymphedema precautions and prevention strategies by discussing 5 precautions/ strategies using a printed reference ( modified independent)  to imit progression of chronic lymphedema.    Baseline Max A    Time 6    Period Days    Status Achieved    Target Date --   6th LE Rx visit                  Plan - 11/17/21 1304     Clinical Impression Statement Miguel Bradley was last seen by OT for lymphedema care on 08/15/21. He denies lymphedema related pain initially, but later admits to some neck stiffness.when driving. He has resumed all typical activities and is enjoying an active lifestyle again, including playing golf with friends frequently. Pt remains limited in L neck rotation by ~ 20 degrees and R lateral flexion is lacking  ~20 degrees by gross assessment.He tells me he compensates for AROM limitations when driving by increasing trunk rotation, and this allows him full safe view for backing up, changing lanes and monitoring traffic behind him.Scar skin tightness at L lateral neck is more obvious today. Pt encouraged to perrform scar massage daily to limit progression . Reviewed scar massage techniques with finger circles, and encouraged Pt to increase depth when working with snall battery operated massager he is using at home. Pt conmtinues to eat slowly and to take small bites, which enables him to swallow food without difficulty.  Circumferential measurements at 3 landmarks, including chin to apex of head, upper and lower neck are essentially unchanged since last visit. The most dramatic overall change is observed in reduction of  lower neck circumference by 7.1 cm since commencing OT for CDT on 05/12/21.Pt is pleased with this reduction. I encouraged him to increase self massage and AROM ther ex at home as these 2 important components of LE self-care are essential for limitting progression. Pt in agreement with plan to return to OT for follow along visit in 3 months. He'll call PRN during the interval. Please refer to [place] section for detailed progress report.    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;Skin integrity;Strength;Pain;Scar mobility;Sensation;Decreased knowledge of use of DME;Decreased knowledge of precautions    Rehab Potential Good    Clinical Decision Making Several treatment options, min-mod task modification necessary    Comorbidities Affecting Occupational Performance: Presence of comorbidities impacting occupational performance    Modification or Assistance to Complete Evaluation  Min-Moderate modification of tasks or assist with assess necessary to complete eval    OT Frequency 1x / week    OT Duration 6 weeks   1 x weekly for 6 weeks initially w emphasis on teaching LE self care, including simple self MLD, scar massage, terapeutic exercise, skin care and compression. Will monitor circumferential measurements of head and neck for reductions, and extend Rx PRN   OT Treatment/Interventions Self-care/ADL training;Therapeutic exercise;Manual Therapy;Manual lymph drainage;Therapeutic activities;DME and/or AE instruction;Patient/family education;Other (comment)   Consider HOS compression garment aka facioplast garment for submental area   Consulted and Agree with Plan of Care Patient              Patient will benefit from skilled therapeutic intervention in order to improve the following deficits and impairments:  Body Structure / Function / Physical Skills: Edema, Skin integrity, Strength, Pain, Scar mobility, Sensation, Decreased knowledge of use of DME, Decreased knowledge of precautions       Visit Diagnosis: Lymphedema, not elsewhere classified - Plan: Ot plan of care cert/re-cert    Problem List Patient Active Problem List   Diagnosis Date Noted   Bilateral carotid artery stenosis 07/20/2021   Cancer of parotid gland ([place]) 09/11/2020   Constipation 07/09/2020   Abnormal cardiac CT angiography 03/16/2020   Coronary artery disease involving native coronary artery of native heart without angina pectoris 03/11/2020   Mild aortic stenosis 03/11/2020   Occlusion of left carotid artery 07/01/2019   Seasonal allergic rhinitis due to pollen 09/24/2018   Diabetic neuropathy ([place]) 06/21/2017   Overweight with body mass index (BMI) of 28 to 28.9 in adult 06/21/2017   Counseling regarding advanced care planning and goals of care 12/08/2014   Type 2 diabetes mellitus with neurological complications ([place]) 28/04/8866   Renal insufficiency 11/25/2010   Testicular hypofunction 11/17/2009   Hyperlipidemia associated with type 2 diabetes mellitus ([place]) 07/05/2007   CARCINOMA, BASAL CELL, FACE 07/03/2007   Essential hypertension 07/03/2007   Andrey Spearman, MS, OTR/L, CLT-LANA 11/17/21 1:41 PM    [place] MAIN [facility] SERVICES [address] [place], [place], [address] Phone: [phone]   Fax:  [phone]  Name: Miguel Bradley MRN: [id number] Date of Birth: 1941/05/12

## 2021-11-21 ENCOUNTER — Ambulatory Visit: Payer: PPO | Admitting: Occupational Therapy

## 2021-11-23 ENCOUNTER — Ambulatory Visit: Payer: PPO | Admitting: Occupational Therapy

## 2021-11-24 DIAGNOSIS — C07 Malignant neoplasm of parotid gland: Secondary | ICD-10-CM | POA: Diagnosis not present

## 2021-11-24 DIAGNOSIS — G510 Bell's palsy: Secondary | ICD-10-CM | POA: Diagnosis not present

## 2021-11-24 DIAGNOSIS — H02402 Unspecified ptosis of left eyelid: Secondary | ICD-10-CM | POA: Diagnosis not present

## 2021-11-24 DIAGNOSIS — H57812 Brow ptosis, left: Secondary | ICD-10-CM | POA: Diagnosis not present

## 2021-11-25 ENCOUNTER — Telehealth: Payer: Self-pay | Admitting: Internal Medicine

## 2021-11-25 NOTE — Telephone Encounter (Signed)
Attempted to schedule.  LMOV to call office.  ° °

## 2021-11-25 NOTE — Telephone Encounter (Signed)
Patient scheduled for carotid too soon.  Needs to cancel and reschedule to 1 yr from last scan.

## 2021-11-28 ENCOUNTER — Ambulatory Visit: Payer: PPO | Admitting: Occupational Therapy

## 2021-11-28 NOTE — Telephone Encounter (Signed)
Pt called back and rescheduled to 08/17/2022.

## 2021-11-30 ENCOUNTER — Ambulatory Visit: Payer: PPO | Admitting: Occupational Therapy

## 2021-12-06 IMAGING — CT CT HEART MORP W/ CTA COR W/ SCORE W/ CA W/CM &/OR W/O CM
1 of 14 series · 3 of 20 positions shown, 4 images · non-contrast
Comparison: None.
COMPARISON: None.

Addendum:
EXAM:
OVER-READ INTERPRETATION  CT CHEST

The following report is an over-read performed by radiologist Dr.
Bee Lay Mendung [REDACTED] on 03/04/2020. This
over-read does not include interpretation of cardiac or coronary
anatomy or pathology. The coronary calcium score/coronary CTA
interpretation by the cardiologist is attached.
CLINICAL DATA: Dyspnea on exertion, angina equivalent
Cardiac/Coronary  CTA
TECHNIQUE: The patient was scanned on a Siemens Somatoform go.Top scanner.

[Series 45: ms multiphase cta coronary 0.60 · axial · 0.44mm/px · z∈[+1542,+1601]mm · 3 of 2664 slices shown, 4 images]
[im 666/2664  vessel]
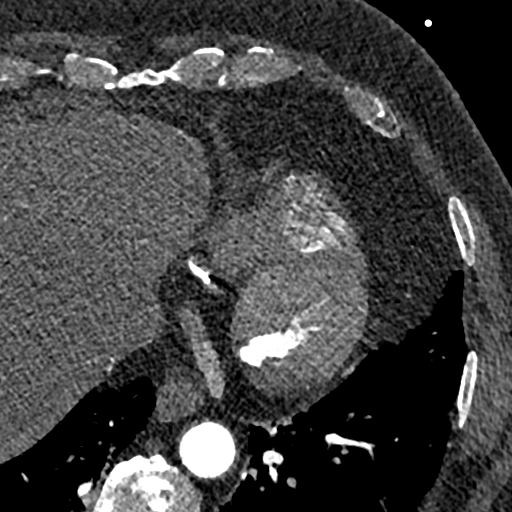
[im 666/2664  lung]
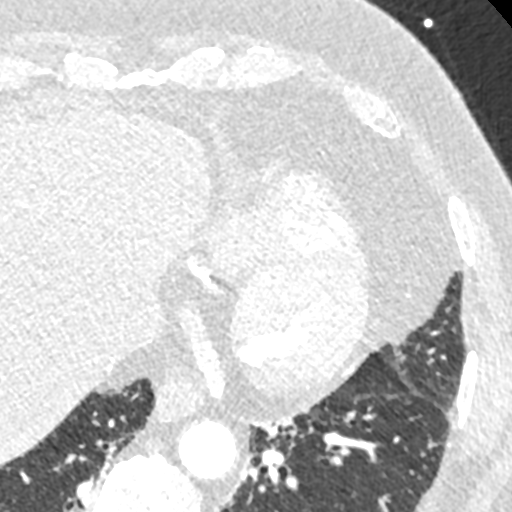
[im 1332/2664  vessel]
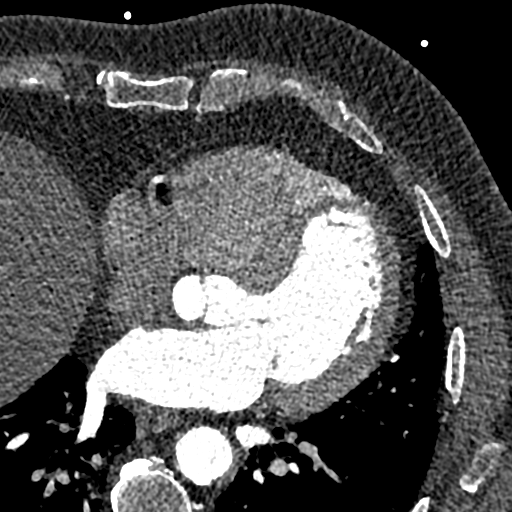
[im 1998/2664  vessel]
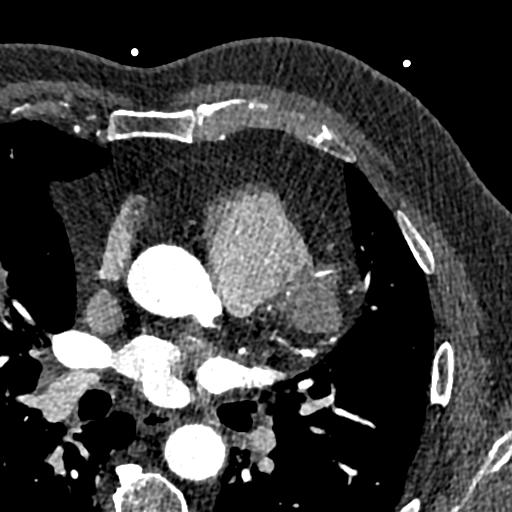

[3 of 20 positions shown; findings below may reference images not displayed]

FINDINGS: Aortic atherosclerosis. Mild scarring in the medial aspect of the
right lower lobe. Within the visualized portions of the thorax there
are no suspicious appearing pulmonary nodules or masses, there is no
acute consolidative airspace disease, no pleural effusions, no
pneumothorax and no lymphadenopathy. Visualized portions of the
upper abdomen are unremarkable. There are no aggressive appearing
lytic or blastic lesions noted in the visualized portions of the
skeleton.
IMPRESSION: 1.  Aortic Atherosclerosis (I3TTY-U6P.P).
FINDINGS: A retrospective scan was triggered in the descending thoracic aorta.
Axial non-contrast 3 mm slices were carried out through the heart.
The data set was analyzed on a dedicated work station and scored
using the Agatson method. Gantry rotation speed was 330 msecs and
collimation was .6 mm. 0.8 mg of sl NTG was given. The 3D data set
was reconstructed in 5% intervals of the 50-95 % of the R-R cycle.
Diastolic phases were analyzed on a dedicated work station using
MPR, MIP and VRT modes. The patient received 90 cc of contrast.

Aorta: Normal size. Mild ascending and descending aortic wall
calcifications. No dissection.

Aortic Valve:  Trileaflet.  Mild calcifications.

Coronary Arteries:  Normal coronary origin.  Right dominance.

RCA is a large dominant artery that gives rise to PDA and PLA. There
is calcified and noncalcified plaque in the mid RCA causing severe
stenosis (70-99%). There is calcified plaque in the proximal and
distal RCA causing mild (25-49% stenosis).

Left main is a large artery that gives rise to LAD and LCX arteries.

LAD is a medium vessel that has heavily calcified plaque in the
proximal segment causing severe stenosis (70-99%). There is
calcified plaque in the proximal first diagonal branch causing
moderate stenosis (50-69%)

LCX is a small non-dominant artery that has no plaque.

Other findings:

Normal pulmonary vein drainage into the left atrium.

Normal left atrial appendage without a thrombus.

Normal size of the pulmonary artery.
IMPRESSION: 1. High coronary calcium score of 1576. This was 90th percentile for
age and sex matched control (MESA data).

2. Normal coronary origin with right dominance.

3. Calcified and noncalcified plaque in the mid RCA causing severe
stenosis (70-99%)

4. Calcified plaque in the proximal LAD causing severe stenosis
(70-99%)

5. Moderate stenosis in the first diagonal branch of the LAD, mild
stenosis in the proximal and distal RCA

6. CAD-RADS 4 Severe stenosis. (70-99% or > 50% left main). Cardiac
catheterization or CT FFR is recommended. Consider symptom-guided
anti-ischemic pharmacotherapy as well as risk factor modification
per guideline directed care. Additional analysis with CT FFR will be
submitted and reported separately.

*** End of Addendum ***
EXAM:
OVER-READ INTERPRETATION  CT CHEST

The following report is an over-read performed by radiologist Dr.
Bee Lay Mendung [REDACTED] on 03/04/2020. This
over-read does not include interpretation of cardiac or coronary
anatomy or pathology. The coronary calcium score/coronary CTA
interpretation by the cardiologist is attached.
FINDINGS: Aortic atherosclerosis. Mild scarring in the medial aspect of the
right lower lobe. Within the visualized portions of the thorax there
are no suspicious appearing pulmonary nodules or masses, there is no
acute consolidative airspace disease, no pleural effusions, no
pneumothorax and no lymphadenopathy. Visualized portions of the
upper abdomen are unremarkable. There are no aggressive appearing
lytic or blastic lesions noted in the visualized portions of the
skeleton.
IMPRESSION: 1.  Aortic Atherosclerosis (I3TTY-U6P.P).

## 2021-12-09 ENCOUNTER — Encounter (INDEPENDENT_AMBULATORY_CARE_PROVIDER_SITE_OTHER): Payer: PPO | Admitting: Ophthalmology

## 2021-12-09 DIAGNOSIS — H35372 Puckering of macula, left eye: Secondary | ICD-10-CM

## 2021-12-09 DIAGNOSIS — I10 Essential (primary) hypertension: Secondary | ICD-10-CM | POA: Diagnosis not present

## 2021-12-09 DIAGNOSIS — H35033 Hypertensive retinopathy, bilateral: Secondary | ICD-10-CM | POA: Diagnosis not present

## 2021-12-09 DIAGNOSIS — H43813 Vitreous degeneration, bilateral: Secondary | ICD-10-CM | POA: Diagnosis not present

## 2021-12-10 ENCOUNTER — Other Ambulatory Visit: Payer: Self-pay | Admitting: Family Medicine

## 2021-12-21 ENCOUNTER — Other Ambulatory Visit: Payer: Self-pay | Admitting: Family Medicine

## 2021-12-28 DIAGNOSIS — G510 Bell's palsy: Secondary | ICD-10-CM | POA: Diagnosis not present

## 2021-12-28 DIAGNOSIS — C089 Malignant neoplasm of major salivary gland, unspecified: Secondary | ICD-10-CM | POA: Diagnosis not present

## 2021-12-28 DIAGNOSIS — H02402 Unspecified ptosis of left eyelid: Secondary | ICD-10-CM | POA: Diagnosis not present

## 2022-01-16 DIAGNOSIS — H40153 Residual stage of open-angle glaucoma, bilateral: Secondary | ICD-10-CM | POA: Diagnosis not present

## 2022-01-17 ENCOUNTER — Encounter: Payer: Self-pay | Admitting: Internal Medicine

## 2022-01-24 ENCOUNTER — Encounter: Payer: Self-pay | Admitting: Internal Medicine

## 2022-01-24 ENCOUNTER — Inpatient Hospital Stay: Payer: PPO

## 2022-01-24 ENCOUNTER — Inpatient Hospital Stay: Payer: PPO | Attending: Internal Medicine

## 2022-01-24 ENCOUNTER — Inpatient Hospital Stay (HOSPITAL_BASED_OUTPATIENT_CLINIC_OR_DEPARTMENT_OTHER): Payer: PPO | Admitting: Internal Medicine

## 2022-01-24 DIAGNOSIS — Z191 Hormone sensitive malignancy status: Secondary | ICD-10-CM | POA: Insufficient documentation

## 2022-01-24 DIAGNOSIS — C07 Malignant neoplasm of parotid gland: Secondary | ICD-10-CM

## 2022-01-24 DIAGNOSIS — Z79899 Other long term (current) drug therapy: Secondary | ICD-10-CM | POA: Insufficient documentation

## 2022-01-24 DIAGNOSIS — C770 Secondary and unspecified malignant neoplasm of lymph nodes of head, face and neck: Secondary | ICD-10-CM | POA: Diagnosis not present

## 2022-01-24 DIAGNOSIS — Z7982 Long term (current) use of aspirin: Secondary | ICD-10-CM | POA: Diagnosis not present

## 2022-01-24 DIAGNOSIS — I700 Atherosclerosis of aorta: Secondary | ICD-10-CM | POA: Diagnosis not present

## 2022-01-24 DIAGNOSIS — N183 Chronic kidney disease, stage 3 unspecified: Secondary | ICD-10-CM | POA: Diagnosis not present

## 2022-01-24 DIAGNOSIS — Z87891 Personal history of nicotine dependence: Secondary | ICD-10-CM | POA: Insufficient documentation

## 2022-01-24 LAB — COMPREHENSIVE METABOLIC PANEL
ALT: 18 U/L (ref 0–44)
AST: 27 U/L (ref 15–41)
Albumin: 3.8 g/dL (ref 3.5–5.0)
Alkaline Phosphatase: 57 U/L (ref 38–126)
Anion gap: 8 (ref 5–15)
BUN: 18 mg/dL (ref 8–23)
CO2: 24 mmol/L (ref 22–32)
Calcium: 9.3 mg/dL (ref 8.9–10.3)
Chloride: 106 mmol/L (ref 98–111)
Creatinine, Ser: 1.13 mg/dL (ref 0.61–1.24)
GFR, Estimated: >60 mL/min (ref >60)
Glucose, Bld: 126 mg/dL — ABNORMAL HIGH (ref 70–99)
Potassium: 3.8 mmol/L (ref 3.5–5.1)
Sodium: 138 mmol/L (ref 135–145)
Total Bilirubin: 0.3 mg/dL (ref 0.3–1.2)
Total Protein: 7.3 g/dL (ref 6.5–8.1)

## 2022-01-24 LAB — CBC WITH DIFFERENTIAL/PLATELET
Abs Immature Granulocytes: 0.02 10*3/uL (ref 0.00–0.07)
Basophils Absolute: 0.1 10*3/uL (ref 0.0–0.1)
Basophils Relative: 1 %
Eosinophils Absolute: 0.2 10*3/uL (ref 0.0–0.5)
Eosinophils Relative: 4 %
HCT: 41.4 % (ref 39.0–52.0)
Hemoglobin: 13.4 g/dL (ref 13.0–17.0)
Immature Granulocytes: 0 %
Lymphocytes Relative: 22 %
Lymphs Abs: 1.4 10*3/uL (ref 0.7–4.0)
MCH: 29.6 pg (ref 26.0–34.0)
MCHC: 32.4 g/dL (ref 30.0–36.0)
MCV: 91.6 fL (ref 80.0–100.0)
Monocytes Absolute: 0.5 10*3/uL (ref 0.1–1.0)
Monocytes Relative: 8 %
Neutro Abs: 4 10*3/uL (ref 1.7–7.7)
Neutrophils Relative %: 65 %
Platelets: 225 10*3/uL (ref 150–400)
RBC: 4.52 MIL/uL (ref 4.22–5.81)
RDW: 13.6 % (ref 11.5–15.5)
WBC: 6.1 10*3/uL (ref 4.0–10.5)
nRBC: 0 % (ref 0.0–0.2)

## 2022-01-24 MED ORDER — LEUPROLIDE ACETATE (6 MONTH) 45 MG ~~LOC~~ KIT
45.0000 mg | PACK | Freq: Once | SUBCUTANEOUS | Status: AC
  Administered 2022-01-24: 45 mg via SUBCUTANEOUS
  Filled 2022-01-24: qty 45

## 2022-01-24 NOTE — Assessment & Plan Note (Addendum)
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 Stage: pT4aN3b-stage IVb.  HER-2 positive; androgen receptor positive.] Patient currently s/p adjuvant radiation-[finished July 16th. 2022].   # April 2023- PET scan- Clinically no evidence of recurrence;  Stable postsurgical changes in the left neck status post parotidectomy and nodal dissection. No evidence of local recurrence or metastatic disease; follow-up PET CT to be done in 06/2022 [facility].  Stable.  # proceed with Eligard today- every 6 months; premedication with Emla cream.   # CKD- stage III-  [1 BID; tylenol]- STABLE.   # Lymphedema neck s/p surgery-s/p  lymphedema treatment/prevention- STABLE.   *eligard q 6 M with josh down stairs  # DISPOSITION:  # Eligard today with Josh downstairs # follow up in 3 months- MD labs- cbc/cmp;  -Dr.B       

## 2022-01-24 NOTE — Progress Notes (Signed)
[facility] NOTE  Patient Care Team: Jinny Sanders, MD as PCP - General (Family Medicine) End, Harrell Gave, MD as PCP - Cardiology (Cardiology) Hayden Pedro, MD as Consulting Physician (Ophthalmology) Lorelee Cover., MD as Referring Physician (Ophthalmology) Oneta Rack, MD as Referring Physician (Dermatology) Ladene Artist, MD as Consulting Physician (Gastroenterology) Rosalene Billings., MD as Referring Physician (Dentistry) Debbora Dus, [facility] as Pharmacist (Pharmacist) Cammie Sickle, MD as Consulting Physician (Hematology and Oncology) Jinny Sanders, MD as Consulting Physician (Family Medicine)  CHIEF COMPLAINTS/PURPOSE OF CONSULTATION: parotid cancer  Oncology History Overview Note  Addendum    Upon clinician request, additional immunostains are performed at [facility] on block F2, with the following results in the tumor cells:   HER2 ([place], clone [id number]) Interpretation: Positive IHC Score: 3+ (using the ASCO/CAP breast biomarker scoring guidelines; there are no consensus guideline for scoring HER2 in salivary duct carcinoma)   This slide has also been reviewed by Dr. Shelby Mattocks [facility] breast pathology) who concurs.  Addendum electronically signed by Tomasa Blase, MD on 09/03/2020 at  3:21 PM  Diagnosis    A. "Tissue around stylomastoid foramen"  - Fibrovascular tissue extensively involved by carcinoma   B. "Main trunk facial nerve"  - Nerve and fibrous tissue extensively involved by carcinoma     C. "Distal facial nerve" - Nerve tissue; negative for carcinoma     D. "Proximal mastoid nerve margin" - Nerve tissue; negative for carcinoma   E: Left upper superficial parotid, parotidectomy - Benign parotid parenchyma with focal acute and chronic inflammation - Negative for carcinoma   F: Left inferior parotid with sternocleidomastoid muscle, resection - High-grade salivary gland carcinoma, most consistent with salivary  duct carcinoma (see comment and synoptic report for further details) - Cauterized tumor present at inked deep surgical margin within fibrous tissue adjacent to skeletal muscle - Metastatic carcinoma in 1.1 cm nodal conglomerate and 4 of 4 intact lymph nodes (at least 5/5); positive for extranodal extension    G: Lymph node, left neck level 5, lymphadenectomy - Benign adipose; no lymph nodes or carcinoma identified   H: Lymph node, left neck level 1, lymphadenectomy - Metastatic carcinoma in 1 of 5 lymph nodes (1/5); maximum size of metastasis 2 mm; negative for extranodal extension.   I: Left omohyoid, excision - Benign skeletal muscle; negative for carcinoma   J: Left deep lobe parotid, excision - Benign parotid tissue; negative for carcinoma   K: Parotid tissue, biopsy - Benign parotid tissue - No metastatic carcinoma identified in 1 lymph nodes (0/1)   L: Left tissue over mastoid, biopsy - Benign parotid parenchyma, dense fibrous tissue, and bone; negative for carcinoma   M: "Stylomastoid foramen stitch on proximal nerve", excision - Large nerve, negative for perineural/intraneural carcinoma; stitched margin negative - Attached fibrous tissue and parotid parenchyma positive for carcinoma, focally invading around but not within large nerve segment, with crushed/cauterized tumor present at tissue edges   N: Tissue over styloid process, biopsy - Benign parotid tissue; negative for carcinoma   O: Posterior digastric margin, biopsy - Benign skeletal muscle; negative for carcinoma   P: Left neck dissections level 1 through 5, lymphadenectomy - Metastatic carcinoma in 11 of 26 lymph nodes (11/26); maximum size of metastasis 20 mm; two nodes (16 mm and 14 mm) positive for extranodal extension (ENEma, up to 4 mm beyond capsule)   Diagnosis: 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 -----------------------------------------------------------------   07/13/10: Right parathyroidectomy Pathology: Hypercellular parathyroid gland   -------------------------------------------------------------------------------------   # MARCH 2022- CT chest- [[facility]] Subcentimeter pulmonary nodules measuring up to 0.8 cm, indeterminate. No comparison available. Attention on follow-up is recommended.  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 Stage: pT4aN3b-stage IVb.  HER-2 positive; androgen receptor positive.]  PET scan-negative for distant metastatic disease.   # CAD [card]; DM- 2; CKD-III     Cancer of parotid gland ([place])  09/11/2020 Initial Diagnosis   Cancer of parotid gland ([place])   09/16/2020 Cancer Staging   Staging form: Major Salivary Glands, AJCC 8th Edition - Pathologic: Stage IVB (pT4a, pN3b, cM0) - Signed by Cammie Sickle, MD on 09/16/2020    HISTORY OF PRESENTING ILLNESS: Accompanied by his daughter ambulating independently.  Miguel Bradley 81 y.o.  male pleasant patient with locally advanced high-grade salivary gland/parotid cancer HER2 positive;AR positive-T4AN3-stage IVb is currently s/p adjuvant radiation; on Eligard q6M is here for follow-up.   Denies any weight loss.  No hot flashes.  Denies any significant joint pains or bone pain.  Chronic mild fatigue.  Denies any worsening swelling in the neck.  S/p physical therapy.  Review of Systems  Constitutional:  Positive for malaise/fatigue. Negative for  chills, diaphoresis and fever.  HENT:  Negative for nosebleeds and sore throat.   Eyes:  Negative for double vision.  Respiratory:  Negative for cough, hemoptysis, sputum production, shortness of breath and wheezing.   Cardiovascular:  Negative for chest pain, palpitations, orthopnea and leg swelling.  Gastrointestinal:  Negative for abdominal pain, blood in stool, constipation, diarrhea, heartburn, melena, nausea and vomiting.  Genitourinary:  Negative for dysuria, frequency and urgency.  Musculoskeletal:  Positive for joint pain. Negative for back pain.  Skin: Negative.  Negative for itching and rash.  Neurological:  Negative for dizziness, tingling, focal weakness, weakness and headaches.  Endo/Heme/Allergies:  Does not bruise/bleed easily.  Psychiatric/Behavioral:  Negative for depression. The patient is not nervous/anxious and does not have insomnia.     MEDICAL HISTORY:  Past Medical History:  Diagnosis Date   Anemia    Aortic stenosis    Mild by most recent echo in 08/2021   Basal cell carcinoma of face    Cancer of parotid gland ([place]) 08/2020   s/p radical left parotidectomy and neck dissection and radiation   Cataract    Diabetes mellitus without complication ([place])    Erectile dysfunction 06/11/2014   Glaucoma    Hypercalcemia    Hyperparathyroidism, unspecified ([place])    Impotence of organic origin    Other testicular hypofunction    Pure hyperglyceridemia    Special screening for malignant neoplasm of prostate    Special screening for malignant neoplasms, colon    Unspecified essential hypertension     SURGICAL HISTORY: Past Surgical History:  Procedure Laterality Date   COLONOSCOPY  06/13/2018   Dr. Lucio Edward   LEFT HEART CATH AND CORONARY ANGIOGRAPHY Left 03/16/2020   Procedure: LEFT HEART CATH AND CORONARY ANGIOGRAPHY;  Surgeon: Nelva Bush, MD;  Location: [facility];  Service:  Cardiovascular;  Laterality: Left;   PARATHYROIDECTOMY  2012    PAROTIDECTOMY W/ NECK DISSECTION TOTAL Left 08/2020   [facility]   skin cancer removal     Dr. Sharlett Iles   TRIGGER FINGER RELEASE Left 2008    SOCIAL HISTORY: Social History   Socioeconomic History   Marital status: Married    Spouse name: Not on file   Number of children: 1   Years of education: Not on file   Highest education level: Not on file  Occupational History   Occupation: Water quality scientist at [employer]   Smoking status: Former    Years: 25.00    Types: Cigarettes    Quit date: 06/12/1978    Years since quitting: 43.6   Smokeless tobacco: Never  Vaping Use   Vaping Use: Never used  Substance and Sexual Activity   Alcohol use: Not Currently    Comment: 1 beer every few weeks   Drug use: No   Sexual activity: Not Currently  Other Topics Concern   Not on file  Social History Narrative   No exercise, plays golf. Lives in [place]; self- [place]; daughter. 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 Rare alcohol. Quit smoking in 1980.    Social Determinants of Health   Financial Resource Strain: Low Risk  (04/26/2021)   Overall Financial Resource Strain (CARDIA)    Difficulty of Paying Living Expenses: Not very hard  Food Insecurity: Not on file  Transportation Needs: Not on file  Physical Activity: Not on file  Stress: Not on file  Social Connections: Not on file  Intimate Partner Violence: Not on file    FAMILY HISTORY: Family History  Problem Relation Age of Onset   Hypertension Mother    Dementia Mother    Diabetes Other        aunt   Hypertension Brother    Hypertension Brother    Hypertension Brother    Hyperlipidemia Brother    Hyperlipidemia Brother    Hyperlipidemia Brother    Hypertension Sister    Bladder Cancer Sister    Hyperlipidemia Sister    Colon cancer Neg Hx    Esophageal cancer Neg Hx    Rectal cancer Neg Hx    Stomach cancer Neg Hx     ALLERGIES:  has No Known Allergies.  MEDICATIONS:  Current Outpatient Medications  Medication Sig  Dispense Refill   aspirin EC 81 MG tablet Take 81 mg by mouth daily.     dorzolamide-timolol (COSOPT) 22.3-6.8 MG/ML ophthalmic solution Place 1 drop into both eyes 2 (two) times daily.      fenofibrate 160 MG tablet TAKE 1 TABLET BY MOUTH EVERY DAY 90 tablet 3   hydrochlorothiazide (HYDRODIURIL) 25 MG tablet TAKE 1 TABLET BY MOUTH EVERY DAY 90 tablet 3   latanoprost (XALATAN) 0.005 % ophthalmic solution Place 1 drop into both eyes at bedtime.      metFORMIN (GLUCOPHAGE) 500 MG tablet TAKE ONE TABLET BY MOUTH TWICE DAILY WITH A MEAL 180 tablet 1   Multiple Vitamin (MULTIVITAMIN) tablet Take 1 tablet by mouth daily.     nitroGLYCERIN (NITROSTAT) 0.4 MG SL tablet Place 1 tablet (0.4 mg total) under the tongue every 5 (five) minutes as needed for chest pain. 25 tablet prn   Omega-3 Fatty Acids (FISH OIL) 1000 MG CAPS Take 1 capsule by mouth daily.     rosuvastatin (CRESTOR) 20 MG tablet TAKE 1 TABLET BY MOUTH EVERY DAY 90 tablet 3   No current facility-administered medications for  this visit.      Marland Kitchen  PHYSICAL EXAMINATION: ECOG PERFORMANCE STATUS: 0 - Asymptomatic  There were no vitals filed for this visit.  There were no vitals filed for this visit.   Physical Exam HENT:     Head: Normocephalic and atraumatic.     Mouth/Throat:     Pharynx: No oropharyngeal exudate.  Eyes:     Pupils: Pupils are equal, round, and reactive to light.  Neck:     Comments: Incision from the left neck dissection; parotidectomy noted.  Well-healing.  No evidence of any infection. Cardiovascular:     Rate and Rhythm: Normal rate and regular rhythm.  Pulmonary:     Effort: No respiratory distress.     Breath sounds: No wheezing.  Abdominal:     General: Bowel sounds are normal. There is no distension.     Palpations: Abdomen is soft. There is no mass.     Tenderness: There is no abdominal tenderness. There is no guarding or rebound.  Musculoskeletal:        General: No tenderness. Normal range of  motion.     Cervical back: Normal range of motion and neck supple.  Skin:    General: Skin is warm.  Neurological:     Mental Status: He is alert and oriented to person, place, and time.     Comments: Left facial droop noted-post surgery.  Psychiatric:        Mood and Affect: Affect normal.     LABORATORY DATA:  I have reviewed the data as listed Lab Results  Component Value Date   WBC 5.5 10/24/2021   HGB 13.6 10/24/2021   HCT 41.3 10/24/2021   MCV 91.6 10/24/2021   PLT 228 10/24/2021   Recent Labs    04/27/21 0934 08/01/21 1024 10/24/21 0947  NA 136 135 134*  K 3.6 3.5 3.6  CL 101 103 104  CO2 _0 GLUCOSE 135* 116* 139*  BUN 19 20 24*  CREATININE 1.08 1.07 1.23  CALCIUM 9.0 9.6 8.9  GFRNONAA >60 >60 59*  PROT 7.5 7.7 7.1  ALBUMIN 3.9 4.4 3.8  AST _1 ALT _2 ALKPHOS 60 56 52  BILITOT 0.2* 0.3 0.6     RADIOGRAPHIC STUDIES: I have personally reviewed the radiological images as listed and agreed with the findings in the report. No results found.  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 Stage: pT4aN3b-stage IVb.  HER-2 positive; androgen receptor positive.] Patient currently s/p adjuvant radiation-[finished July 16th. 2022]. April 2023- PET scan- Clinically no evidence of recurrence;  Stable postsurgical changes in the left neck status post parotidectomy and nodal dissection. No evidence of local recurrence or metastatic disease;  No suspicious hypermetabolic activity in the chest, abdomen or pelvis.   # CKD- stage III-  [1 BID; tylenol]- STABLE.   # pain from eliagrd- plan prior emla cream.  Emla cream prescribed.  #Lymphedema neck s/p surgery-s/p  lymphedema  treatment/prevention- STABLE.   #Incidental findings on Imaging  PET scan, 2023: Coronary and Aortic Atherosclerosis; aortic valvular calcifications. I reviewed/discussed/counseled the patient.   # DISPOSITION:  # follow up in 3 months- MD labs- cbc/cmp;Eligard -Dr.B  # I reviewed the blood work-  with the patient in detail; also reviewed the imaging independently [as summarized above]; and with the patient in detail.          All questions were answered. The patient knows to call the clinic with any problems, questions or concerns.    Cammie Sickle, MD 01/24/2022 8:47 AM

## 2022-02-02 ENCOUNTER — Ambulatory Visit
Admission: RE | Admit: 2022-02-02 | Discharge: 2022-02-02 | Disposition: A | Discharge status: Home / Self Care | Payer: PPO | Source: Ambulatory Visit | Attending: Radiation Oncology | Admitting: Radiation Oncology

## 2022-02-02 VITALS — BP 121/65 | HR 62 | Temp 95.6°F | Resp 16 | Ht 69.25 in | Wt 197.0 lb

## 2022-02-02 DIAGNOSIS — Z85818 Personal history of malignant neoplasm of other sites of lip, oral cavity, and pharynx: Secondary | ICD-10-CM | POA: Diagnosis not present

## 2022-02-02 DIAGNOSIS — C07 Malignant neoplasm of parotid gland: Secondary | ICD-10-CM

## 2022-02-02 DIAGNOSIS — Z923 Personal history of irradiation: Secondary | ICD-10-CM | POA: Insufficient documentation

## 2022-02-02 NOTE — Progress Notes (Signed)
Radiation Oncology Follow up Note  Name: Miguel Bradley   Date:   02/02/2022 MRN:  [id number] DOB: Jan 21, 1941    This 81 y.o. male presents to the clinic today for 75-monthfollow-up status post adjuvant radiation therapy to his left parotid bed and neck for locally advanced salivary duct carcinoma of the parotid status post resection with multiple lymph nodes involved..Marland Kitchen REFERRING PROVIDER: BJinny Sanders MD  HPI: Patient is a 81year old male now about 14 months having completed adjuvant radiation therapy to his left parotid bed and neck for local advanced salivary duct carcinoma.  Seen today in routine follow-up he is doing well specifically denies dysphagia or head and neck pain.  He had a PET CT scan which I have reviewed.  Back in April showing no evidence of disease.  No other evidence of distant disease was noted.  He continues to have sequela of facial nerve involvement from his prior surgery.  COMPLICATIONS OF TREATMENT: none  FOLLOW UP COMPLIANCE: keeps appointments   PHYSICAL EXAM:  BP 121/65   Pulse 62   Temp (!) 95.6 F (35.3 C)   Resp 16   Ht 5' 9.25" (1.759 m)   Wt 197 lb (89.4 kg)   BMI 28.88 kg/m  Patient is a facial droop on the left secondary to facial nerve damage from tumor involvement and surgery.  He has some swelling in his left neck which is unchanged over time.  No other adenopathy is noted.  No mass in the parotid bed is noted.  Well-developed well-nourished patient in NAD. HEENT reveals PERLA, EOMI, discs not visualized.  Oral cavity is clear. No oral mucosal lesions are identified. Neck is clear without evidence of cervical or supraclavicular adenopathy. Lungs are clear to A&P. Cardiac examination is essentially unremarkable with regular rate and rhythm without murmur rub or thrill. Abdomen is benign with no organomegaly or masses noted. Motor sensory and DTR levels are equal and symmetric in the upper and lower extremities. Cranial nerves II through XII are  grossly intact. Proprioception is intact. No peripheral adenopathy or edema is identified. No motor or sensory levels are noted. Crude visual fields are within normal range.  RADIOLOGY RESULTS: PET scan is reviewed compatible with above-stated findings  PLAN: Present time patient continues to do well now out 14 months with no evidence of disease.  I have pleased with his overall progress.  Of asked to see him back in 1 year for follow-up.  Patient is to call with any concerns.  I would like to take this opportunity to thank you for allowing me to participate in the care of your patient..Noreene Filbert MD

## 2022-02-09 DIAGNOSIS — C07 Malignant neoplasm of parotid gland: Secondary | ICD-10-CM | POA: Diagnosis not present

## 2022-02-09 DIAGNOSIS — H02402 Unspecified ptosis of left eyelid: Secondary | ICD-10-CM | POA: Diagnosis not present

## 2022-02-09 DIAGNOSIS — G510 Bell's palsy: Secondary | ICD-10-CM | POA: Diagnosis not present

## 2022-02-14 ENCOUNTER — Ambulatory Visit: Payer: PPO | Attending: Internal Medicine | Admitting: Occupational Therapy

## 2022-02-14 DIAGNOSIS — I890 Lymphedema, not elsewhere classified: Secondary | ICD-10-CM | POA: Diagnosis not present

## 2022-02-24 NOTE — Therapy (Signed)
[place] [facility] SERVICES [address] [place], [place], [address] Phone: [phone]   Fax:  [phone]  Occupational Therapy Treatment Note  Head and Neck Lymphedema Follow Up  Patient Details  Name: Miguel Bradley MRN: [id number] Date of Birth: 09/03/1940 Referring Provider (OT): Cammie Sickle, MD   Encounter Date: 02/14/2022   OT End of Session - 02/24/22 1303     Visit Number 14    Number of Visits 36    Date for OT Re-Evaluation 02/14/22    OT Start Time 0210    OT Stop Time 0255    OT Time Calculation (min) 45 min    Activity Tolerance Patient tolerated treatment well;No increased pain    Behavior During Therapy [facility] for tasks assessed/performed             Past Medical History:  Diagnosis Date   Anemia    Aortic stenosis    Mild by most recent echo in 08/2021   Basal cell carcinoma of face    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 Lucio Edward   LEFT HEART CATH AND CORONARY ANGIOGRAPHY Left 03/16/2020   Procedure: LEFT HEART CATH AND CORONARY ANGIOGRAPHY;  Surgeon: Nelva Bush, MD;  Location: [place] [facility];  Service: Cardiovascular;  Laterality: Left;   PARATHYROIDECTOMY  2012   PAROTIDECTOMY W/ NECK DISSECTION TOTAL Left 08/2020   [facility]   skin cancer removal     Dr. Sharlett Iles   TRIGGER FINGER RELEASE Left 2008    There were no vitals filed  for this visit.                 OT Treatments/Exercises (OP) - 02/24/22 1304       ADLs   ADL Education Given Yes      Manual Therapy   Manual Therapy Edema management;Manual Lymphatic Drainage (MLD)    Edema Management anatomical measurements of head and nec circumferences                    OT Education - 02/24/22 1304     Education Details Continued Pt/ CG edu for lymphedema self care  and home program throughout session. Topics include compression options, simple self-MLD, therapeutic lymphatic pumping exercises, skin/nail care, risk reduction factors and LE precautions. Emphasis on Pt and sig other training for lymphatic map of head and neck and simplified sequencing for self MLD using axillary watershed. All questions answered to the Pt's satisfaction, and Pt demonstrates understanding by report.    Person(s) Educated Patient    Methods Explanation;Demonstration;Handout    Comprehension Verbalized understanding;Returned demonstration                 OT Long Term Goals - 02/24/22 1304       OT LONG TERM GOAL #1   Title Pt will be modified independent with all lymphedema  home program companents using printed reference for rference, including simple self-MLD, therapeutic pumping exercises, stretching and AROM ther ex, skin care  and compression PRN.    Baseline Max A    Time 6    Period Days    Status Achieved    Target Date --   6th OT Rx visit     OT LONG TERM GOAL #2   Title Given this patient's Intake score of 98/100 on the functional outcomes FOTO tool, patient will experience an increase in function of 3 points to improve basic and instrumental ADLs performance, including lymphedema self-care.    Baseline TBA Rx visit 1    Time 12    Period Weeks    Status Not Met   Final FOTO score unchanged at DC- 98.48/100%. nO CHANGE.     OT LONG TERM GOAL #3   Title Pt will demonstrate a 5% girth reduction at upper and lower neck landmarks and at  circumferential measurement of head (under chin) to limit LE progression and to regain maximum AROM .    Baseline MaX A    Time 12    Period Weeks    Status Partially Met   Achieved for inferior neck circumference onlt with 16% reduction. ISuperior neck and Circ of chin to head apex essentially unchanged snce 07/2021     OT LONG TERM GOAL #4   Title Pt will be independent with head and upper body postural corrections by DC to maximise breathing and limit negative impact on balance.    Baseline Max A    Time 12    Period Weeks    Status Achieved      OT LONG TERM GOAL #5   Title Pt will demonstrate knowlwdge and understanding of lymphedema precautions and prevention strategies by discussing 5 precautions/ strategies using a printed reference ( modified independent)  to imit progression of chronic lymphedema.    Baseline Max A    Time 6    Period Days    Status Achieved    Target Date --   6th LE Rx visit                  Plan - 02/24/22 1304     Clinical Impression Statement Miguel Bradley was last seen by OT for lymphedema care on 08/15/21. He denies lymphedema related pain initially, but later admits to some neck stiffness.when driving. He has resumed all typical activities and is enjoying an active lifestyle again, including playing golf with friends frequently. Pt remains limited in L neck rotation by ~ 20 degrees and R lateral flexion is lacking  ~20 degrees by gross assessment.He tells me he compensates for AROM limitations when driving by increasing trunk rotation, and this allows him full safe view for backing up, changing lanes and monitoring traffic behind him.Scar skin tightness at L lateral neck is more obvious today. Pt encouraged to perrform scar massage daily to limit progression . Reviewed scar massage techniques with finger circles, and encouraged Pt to increase depth when working with snall battery operated massager he is using at home. Pt conmtinues to eat slowly and to  take small bites, which enables him to swallow food without difficulty.  Circumferential measurements at 3 landmarks, including chin to apex of head, upper and lower neck are essentially unchanged since last visit. The most dramatic overall change is observed in reduction of lower neck circumference by 7.1 cm since commencing OT for CDT on 05/12/21.Pt is pleased with this  reduction. I encouraged him to increase self massage and AROM ther ex at home as these 2 important components of LE self-care are essential for limitting progression. Pt in agreement with plan to return to OT for follow along visit in 3 months. He'll call PRN during the interval. Please refer to [place] section for detailed progress report.    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;Skin integrity;Strength;Pain;Scar mobility;Sensation;Decreased knowledge of use of DME;Decreased knowledge of precautions    Rehab Potential Good    Clinical Decision Making Several treatment options, min-mod task modification necessary    Comorbidities Affecting Occupational Performance: Presence of comorbidities impacting occupational performance    Modification or Assistance to Complete Evaluation  Min-Moderate modification of tasks or assist with assess necessary to complete eval    OT Frequency 1x / week    OT Duration 6 weeks   1 x weekly for 6 weeks initially w emphasis on teaching LE self care, including simple self MLD, scar massage, terapeutic exercise, skin care and compression. Will monitor circumferential measurements of head and neck for reductions, and extend Rx PRN   OT Treatment/Interventions Self-care/ADL  training;Therapeutic exercise;Manual Therapy;Manual lymph drainage;Therapeutic activities;DME and/or AE instruction;Patient/family education;Other (comment)   Consider HOS compression garment aka facioplast garment for submental area   Consulted and Agree with Plan of Care Patient             Patient will benefit from skilled therapeutic intervention in order to improve the following deficits and impairments:   Body Structure / Function / Physical Skills: Edema, Skin integrity, Strength, Pain, Scar mobility, Sensation, Decreased knowledge of use of DME, Decreased knowledge of precautions       Visit Diagnosis: Lymphedema, not elsewhere classified - Plan: Ot plan of care cert/re-cert    Problem List Patient Active Problem List   Diagnosis Date Noted   Bilateral carotid artery stenosis 07/20/2021   Cancer of parotid gland ([place]) 09/11/2020   Constipation 07/09/2020   Abnormal cardiac CT angiography 03/16/2020   Coronary artery disease involving native coronary artery of native heart without angina pectoris 03/11/2020   Mild aortic stenosis 03/11/2020   Occlusion of left carotid artery 07/01/2019   Seasonal allergic rhinitis due to pollen 09/24/2018   Diabetic neuropathy ([place]) 06/21/2017   Overweight with body mass index (BMI) of 28 to 28.9 in adult 06/21/2017   Counseling regarding advanced care planning and goals of care 12/08/2014   Type 2 diabetes mellitus with neurological complications ([place]) 03/28/5101   Renal insufficiency 11/25/2010   Testicular hypofunction 11/17/2009   Hyperlipidemia associated with type 2 diabetes mellitus ([place]) 07/05/2007   CARCINOMA, BASAL CELL, FACE 07/03/2007   Essential hypertension 07/03/2007    Miguel Spearman, MS, OTR/L, CLT-LANA 02/24/22 1:09 PM   [place] [facility] SERVICES [place], [place], [address] Phone: [phone]   Fax:  [phone]  Name: Miguel Bradley MRN:  [id number] Date of Birth: [birth year]

## 2022-04-05 DIAGNOSIS — Z923 Personal history of irradiation: Secondary | ICD-10-CM | POA: Diagnosis not present

## 2022-04-05 DIAGNOSIS — Z85858 Personal history of malignant neoplasm of other endocrine glands: Secondary | ICD-10-CM | POA: Diagnosis not present

## 2022-04-05 DIAGNOSIS — C760 Malignant neoplasm of head, face and neck: Secondary | ICD-10-CM | POA: Diagnosis not present

## 2022-04-05 DIAGNOSIS — C089 Malignant neoplasm of major salivary gland, unspecified: Secondary | ICD-10-CM | POA: Diagnosis not present

## 2022-04-26 ENCOUNTER — Other Ambulatory Visit: Payer: PPO

## 2022-04-26 ENCOUNTER — Ambulatory Visit: Payer: PPO | Admitting: Medical Oncology

## 2022-04-29 ENCOUNTER — Telehealth: Payer: Self-pay | Admitting: Family Medicine

## 2022-04-29 DIAGNOSIS — E1149 Type 2 diabetes mellitus with other diabetic neurological complication: Secondary | ICD-10-CM

## 2022-04-29 NOTE — Telephone Encounter (Signed)
-----   Message from Velna Hatchet, RT sent at 04/24/2022  2:28 PM EST ----- Regarding: Tue 11/21 labs Patient is scheduled for cpx, please order future labs.  Thanks, Anda Kraft

## 2022-05-02 ENCOUNTER — Other Ambulatory Visit (INDEPENDENT_AMBULATORY_CARE_PROVIDER_SITE_OTHER): Payer: PPO

## 2022-05-02 DIAGNOSIS — E1149 Type 2 diabetes mellitus with other diabetic neurological complication: Secondary | ICD-10-CM | POA: Diagnosis not present

## 2022-05-02 LAB — COMPREHENSIVE METABOLIC PANEL
ALT: 12 U/L (ref 0–53)
AST: 20 U/L (ref 0–37)
Albumin: 4.1 g/dL (ref 3.5–5.2)
Alkaline Phosphatase: 70 U/L (ref 39–117)
BUN: 21 mg/dL (ref 6–23)
CO2: 27 mEq/L (ref 19–32)
Calcium: 9.4 mg/dL (ref 8.4–10.5)
Chloride: 105 mEq/L (ref 96–112)
Creatinine, Ser: 1.17 mg/dL (ref 0.40–1.50)
GFR: 58.76 mL/min — ABNORMAL LOW (ref >60.00)
Glucose, Bld: 125 mg/dL — ABNORMAL HIGH (ref 70–99)
Potassium: 4 mEq/L (ref 3.5–5.1)
Sodium: 139 mEq/L (ref 135–145)
Total Bilirubin: 0.4 mg/dL (ref 0.2–1.2)
Total Protein: 6.6 g/dL (ref 6.0–8.3)

## 2022-05-02 LAB — LIPID PANEL
Cholesterol: 136 mg/dL (ref 0–200)
HDL: 42.9 mg/dL (ref >39.00)
LDL Cholesterol: 58 mg/dL (ref 0–99)
NonHDL: 92.92
Total CHOL/HDL Ratio: 3
Triglycerides: 177 mg/dL — ABNORMAL HIGH (ref 0.0–149.0)
VLDL: 35.4 mg/dL (ref 0.0–40.0)

## 2022-05-02 LAB — MICROALBUMIN / CREATININE URINE RATIO
Creatinine,U: 58.5 mg/dL
Microalb Creat Ratio: 1.2 mg/g (ref 0.0–30.0)
Microalb, Ur: <0.7 mg/dL (ref 0.0–1.9)

## 2022-05-02 LAB — HEMOGLOBIN A1C: Hgb A1c MFr Bld: 6.6 % — ABNORMAL HIGH (ref 4.6–6.5)

## 2022-05-02 NOTE — Progress Notes (Signed)
No critical labs need to be addressed urgently. We will discuss labs in detail at upcoming office visit.   

## 2022-05-10 ENCOUNTER — Other Ambulatory Visit: Payer: Self-pay | Admitting: Family Medicine

## 2022-05-12 ENCOUNTER — Encounter: Payer: Self-pay | Admitting: Family Medicine

## 2022-05-12 ENCOUNTER — Ambulatory Visit (INDEPENDENT_AMBULATORY_CARE_PROVIDER_SITE_OTHER): Payer: PPO | Admitting: Family Medicine

## 2022-05-12 VITALS — BP 110/62 | HR 54 | Temp 97.8°F | Ht 69.25 in | Wt 202.2 lb

## 2022-05-12 DIAGNOSIS — I10 Essential (primary) hypertension: Secondary | ICD-10-CM

## 2022-05-12 DIAGNOSIS — E785 Hyperlipidemia, unspecified: Secondary | ICD-10-CM | POA: Diagnosis not present

## 2022-05-12 DIAGNOSIS — E1142 Type 2 diabetes mellitus with diabetic polyneuropathy: Secondary | ICD-10-CM

## 2022-05-12 DIAGNOSIS — E1149 Type 2 diabetes mellitus with other diabetic neurological complication: Secondary | ICD-10-CM | POA: Diagnosis not present

## 2022-05-12 DIAGNOSIS — E1169 Type 2 diabetes mellitus with other specified complication: Secondary | ICD-10-CM | POA: Diagnosis not present

## 2022-05-12 DIAGNOSIS — R0989 Other specified symptoms and signs involving the circulatory and respiratory systems: Secondary | ICD-10-CM | POA: Insufficient documentation

## 2022-05-12 DIAGNOSIS — E1159 Type 2 diabetes mellitus with other circulatory complications: Secondary | ICD-10-CM

## 2022-05-12 DIAGNOSIS — I152 Hypertension secondary to endocrine disorders: Secondary | ICD-10-CM

## 2022-05-12 NOTE — Progress Notes (Signed)
Patient ID: Miguel Bradley, male    DOB: 06-11-1941, 81 y.o.   MRN: [id number]  This visit was conducted in person.  BP 110/62   Pulse (!) 54   Temp 97.8 F (36.6 C) (Oral)   Ht 5' 9.25" (1.759 m)   Wt 202 lb 4 oz (91.7 kg)   SpO2 95%   BMI 29.65 kg/m    CC:  Chief Complaint  Patient presents with   Diabetes    Subjective:   HPI: Miguel Bradley is a 81 y.o. male presenting on 05/12/2022 for Diabetes  Diabetes: Good control on metformin 500 mg p.o. twice daily Lab Results  Component Value Date   HGBA1C 6.6 (H) 05/02/2022  Using medications without difficulties: Hypoglycemic episodes: Hyperglycemic episodes: Feet problems: none Blood Sugars averaging: not checking eye exam within last year: yes  Hypertension:  Well-controlled on hydrochlorothiazide 25 mg daily BP Readings from Last 3 Encounters:  05/12/22 110/62  02/02/22 121/65  01/24/22 (!) 140/63  Using medication without problems or lightheadedness: none Chest pain with exertion: none Edema:none Short of breath: Slight, lots of virtual visit Other issues:  Elevated Cholesterol: Good control on Crestor 20 mg daily  fenofibrate 160 mg daily Lab Results  Component Value Date   CHOL 136 05/02/2022   HDL 42.90 05/02/2022   LDLCALC 58 05/02/2022   LDLDIRECT 70.0 12/31/2020   TRIG 177.0 (H) 05/02/2022   CHOLHDL 3 05/02/2022  Using medications without problems: Muscle aches:  Diet compliance: moderate Exercise: once a week golfing, trying to walk more Other complaints:        Relevant past medical, surgical, family and social history reviewed and updated as indicated. Interim medical history since our last visit reviewed. Allergies and medications reviewed and updated. Outpatient Medications Prior to Visit  Medication Sig Dispense Refill   aspirin EC 81 MG tablet Take 81 mg by mouth daily.     dorzolamide-timolol (COSOPT) 22.3-6.8 MG/ML ophthalmic solution Place 1 drop into both eyes 2 (two) times  daily.      fenofibrate 160 MG tablet TAKE 1 TABLET BY MOUTH EVERY DAY 90 tablet 3   hydrochlorothiazide (HYDRODIURIL) 25 MG tablet TAKE 1 TABLET BY MOUTH EVERY DAY 90 tablet 3   latanoprost (XALATAN) 0.005 % ophthalmic solution Place 1 drop into both eyes at bedtime.      metFORMIN (GLUCOPHAGE) 500 MG tablet TAKE ONE TABLET BY MOUTH TWICE DAILY WITH A MEAL 180 tablet 1   Multiple Vitamin (MULTIVITAMIN) tablet Take 1 tablet by mouth daily.     nitroGLYCERIN (NITROSTAT) 0.4 MG SL tablet Place 1 tablet (0.4 mg total) under the tongue every 5 (five) minutes as needed for chest pain. 25 tablet prn   Omega-3 Fatty Acids (FISH OIL) 1000 MG CAPS Take 1 capsule by mouth daily.     rosuvastatin (CRESTOR) 20 MG tablet TAKE 1 TABLET BY MOUTH EVERY DAY 90 tablet 3   No facility-administered medications prior to visit.     Per HPI unless specifically indicated in ROS section below Review of Systems  Constitutional:  Negative for fatigue and fever.  HENT:  Negative for ear pain.   Eyes:  Negative for pain.  Respiratory:  Negative for cough and shortness of breath.   Cardiovascular:  Negative for chest pain, palpitations and leg swelling.  Gastrointestinal:  Negative for abdominal pain.  Genitourinary:  Negative for dysuria.  Musculoskeletal:  Negative for arthralgias.  Neurological:  Negative for syncope, light-headedness and headaches.  Psychiatric/Behavioral:  Negative for dysphoric mood.    Objective:  BP 110/62   Pulse (!) 54   Temp 97.8 F (36.6 C) (Oral)   Ht 5' 9.25" (1.759 m)   Wt 202 lb 4 oz (91.7 kg)   SpO2 95%   BMI 29.65 kg/m   Wt Readings from Last 3 Encounters:  05/12/22 202 lb 4 oz (91.7 kg)  02/02/22 197 lb (89.4 kg)  01/24/22 197 lb 3.2 oz (89.4 kg)      Physical Exam Constitutional:      Appearance: He is well-developed.     Comments: Surgical changes to left face from parotid and lymph node removal  HENT:     Head: Normocephalic.     Right Ear: Hearing normal.      Left Ear: Hearing normal.     Nose: Nose normal.  Neck:     Thyroid: No thyroid mass or thyromegaly.     Vascular: No carotid bruit.     Trachea: Trachea normal.  Cardiovascular:     Rate and Rhythm: Normal rate and regular rhythm.     Pulses: Normal pulses.     Heart sounds: Heart sounds not distant. No murmur heard.    No friction rub. No gallop.     Comments: No peripheral edema Pulmonary:     Effort: Pulmonary effort is normal. No respiratory distress.     Breath sounds: Examination of the right-lower field reveals rales. Rales present.     Comments: Rales heard at base on right did not clear with deep breaths, Skin:    General: Skin is warm and dry.     Findings: No rash.  Psychiatric:        Speech: Speech normal.        Behavior: Behavior normal.        Thought Content: Thought content normal.       Results for orders placed or performed in visit on 05/02/22  Microalbumin / creatinine urine ratio  Result Value Ref Range   Microalb, Ur <0.7 0.0 - 1.9 mg/dL   Creatinine,U 58.5 mg/dL   Microalb Creat Ratio 1.2 0.0 - 30.0 mg/g  Comprehensive metabolic panel  Result Value Ref Range   Sodium 139 135 - 145 mEq/L   Potassium 4.0 3.5 - 5.1 mEq/L   Chloride 105 96 - 112 mEq/L   CO2 27 19 - 32 mEq/L   Glucose, Bld 125 (H) 70 - 99 mg/dL   BUN 21 6 - 23 mg/dL   Creatinine, Ser 1.17 0.40 - 1.50 mg/dL   Total Bilirubin 0.4 0.2 - 1.2 mg/dL   Alkaline Phosphatase 70 39 - 117 U/L   AST 20 0 - 37 U/L   ALT 12 0 - 53 U/L   Total Protein 6.6 6.0 - 8.3 g/dL   Albumin 4.1 3.5 - 5.2 g/dL   GFR 58.76 (L) >60.00 mL/min   Calcium 9.4 8.4 - 10.5 mg/dL  Lipid panel  Result Value Ref Range   Cholesterol 136 0 - 200 mg/dL   Triglycerides 177.0 (H) 0.0 - 149.0 mg/dL   HDL 42.90 >39.00 mg/dL   VLDL 35.4 0.0 - 40.0 mg/dL   LDL Cholesterol 58 0 - 99 mg/dL   Total CHOL/HDL Ratio 3    NonHDL 92.92   Hemoglobin A1c  Result Value Ref Range   Hgb A1c MFr Bld 6.6 (H) 4.6 - 6.5 %      COVID 19 screen:  No recent travel or known exposure to COVID19 The  patient denies respiratory symptoms of COVID 19 at this time. The importance of social distancing was discussed today.   Assessment and Plan  Problem List Items Addressed This Visit     Diabetic neuropathy ([place]) (Chronic)    Chronic, stable control, minimally symptomatic  Secondary to diabetes      Essential hypertension (Chronic)    Stable, chronic.  Continue current medication.  HCTZ 25 mg daily      Hyperlipidemia associated with type 2 diabetes mellitus ([facility]) (Chronic)    Stable, chronic.  Continue current medication.  LDL at goal less than 70  Crestor 20 mg daily  fenofibrate 160 mg daily      Type 2 diabetes mellitus with neurological complications ([facility]) - Primary (Chronic)    Stable, chronic.  Continue current medication.  Metformin 500 mg p.o. twice daily      Rales   Other Visit Diagnoses     Hypertension associated with diabetes ([place])            Noted rales  in left base of lung.. pt asymptomatic... will follow, no sign of fluid overload, no sign of infection etc.  Past CT chest from 08/2020 reviewed from [facility] on care everywhere.Marland Kitchen  He has upcoming PET scan with ONC Impression Subcentimeter pulmonary nodules measuring up to 0.8 cm, indeterminate. No comparison available.   09/2021 PET SCAN: Stable appearance of incidentally noted endobronchial pulmonary nodule of the right upper lobe measuring up to 6 mm compared to CT dated 08/13/2020. In a patient with history of smoking, and given that this is below the resolution of PET, recommend continued attention to on follow-up imaging as primary pulmonary neoplasm cannot be excluded.     Eliezer Lofts, MD

## 2022-05-12 NOTE — Patient Instructions (Signed)
Work on decreasing sweets and increase water intake.

## 2022-05-12 NOTE — Assessment & Plan Note (Signed)
Stable, chronic.  Continue current medication.  Metformin 500 mg p.o. twice daily

## 2022-05-12 NOTE — Assessment & Plan Note (Addendum)
Stable, chronic.  Continue current medication.  LDL at goal less than 70  Crestor 20 mg daily  fenofibrate 160 mg daily

## 2022-05-12 NOTE — Assessment & Plan Note (Signed)
Chronic, stable control, minimally symptomatic  Secondary to diabetes

## 2022-05-12 NOTE — Assessment & Plan Note (Signed)
Stable, chronic.  Continue current medication.  HCTZ 25 mg daily 

## 2022-05-18 DIAGNOSIS — H40153 Residual stage of open-angle glaucoma, bilateral: Secondary | ICD-10-CM | POA: Diagnosis not present

## 2022-05-19 ENCOUNTER — Other Ambulatory Visit: Payer: Self-pay

## 2022-05-19 DIAGNOSIS — I890 Lymphedema, not elsewhere classified: Secondary | ICD-10-CM

## 2022-05-19 DIAGNOSIS — C07 Malignant neoplasm of parotid gland: Secondary | ICD-10-CM

## 2022-05-22 ENCOUNTER — Inpatient Hospital Stay (HOSPITAL_BASED_OUTPATIENT_CLINIC_OR_DEPARTMENT_OTHER): Payer: PPO | Admitting: Internal Medicine

## 2022-05-22 ENCOUNTER — Inpatient Hospital Stay: Payer: PPO | Attending: Internal Medicine

## 2022-05-22 DIAGNOSIS — Z85858 Personal history of malignant neoplasm of other endocrine glands: Secondary | ICD-10-CM | POA: Diagnosis not present

## 2022-05-22 DIAGNOSIS — I129 Hypertensive chronic kidney disease with stage 1 through stage 4 chronic kidney disease, or unspecified chronic kidney disease: Secondary | ICD-10-CM | POA: Diagnosis not present

## 2022-05-22 DIAGNOSIS — Z87891 Personal history of nicotine dependence: Secondary | ICD-10-CM | POA: Insufficient documentation

## 2022-05-22 DIAGNOSIS — N183 Chronic kidney disease, stage 3 unspecified: Secondary | ICD-10-CM | POA: Insufficient documentation

## 2022-05-22 DIAGNOSIS — C07 Malignant neoplasm of parotid gland: Secondary | ICD-10-CM | POA: Diagnosis not present

## 2022-05-22 DIAGNOSIS — Z923 Personal history of irradiation: Secondary | ICD-10-CM | POA: Diagnosis not present

## 2022-05-22 DIAGNOSIS — E1122 Type 2 diabetes mellitus with diabetic chronic kidney disease: Secondary | ICD-10-CM | POA: Diagnosis not present

## 2022-05-22 DIAGNOSIS — I890 Lymphedema, not elsewhere classified: Secondary | ICD-10-CM

## 2022-05-22 DIAGNOSIS — Z7982 Long term (current) use of aspirin: Secondary | ICD-10-CM | POA: Diagnosis not present

## 2022-05-22 DIAGNOSIS — Z79899 Other long term (current) drug therapy: Secondary | ICD-10-CM | POA: Diagnosis not present

## 2022-05-22 DIAGNOSIS — Z7984 Long term (current) use of oral hypoglycemic drugs: Secondary | ICD-10-CM | POA: Insufficient documentation

## 2022-05-22 LAB — COMPREHENSIVE METABOLIC PANEL
ALT: 16 U/L (ref 0–44)
AST: 26 U/L (ref 15–41)
Albumin: 3.6 g/dL (ref 3.5–5.0)
Alkaline Phosphatase: 68 U/L (ref 38–126)
Anion gap: 9 (ref 5–15)
BUN: 22 mg/dL (ref 8–23)
CO2: 26 mmol/L (ref 22–32)
Calcium: 8.6 mg/dL — ABNORMAL LOW (ref 8.9–10.3)
Chloride: 100 mmol/L (ref 98–111)
Creatinine, Ser: 1.08 mg/dL (ref 0.61–1.24)
GFR, Estimated: >60 mL/min (ref >60)
Glucose, Bld: 131 mg/dL — ABNORMAL HIGH (ref 70–99)
Potassium: 3.8 mmol/L (ref 3.5–5.1)
Sodium: 135 mmol/L (ref 135–145)
Total Bilirubin: 0.3 mg/dL (ref 0.3–1.2)
Total Protein: 6.8 g/dL (ref 6.5–8.1)

## 2022-05-22 LAB — CBC WITH DIFFERENTIAL/PLATELET
Abs Immature Granulocytes: 0.02 10*3/uL (ref 0.00–0.07)
Basophils Absolute: 0.1 10*3/uL (ref 0.0–0.1)
Basophils Relative: 1 %
Eosinophils Absolute: 0.3 10*3/uL (ref 0.0–0.5)
Eosinophils Relative: 5 %
HCT: 41 % (ref 39.0–52.0)
Hemoglobin: 13.1 g/dL (ref 13.0–17.0)
Immature Granulocytes: 0 %
Lymphocytes Relative: 23 %
Lymphs Abs: 1.6 10*3/uL (ref 0.7–4.0)
MCH: 29.6 pg (ref 26.0–34.0)
MCHC: 32 g/dL (ref 30.0–36.0)
MCV: 92.8 fL (ref 80.0–100.0)
Monocytes Absolute: 0.7 10*3/uL (ref 0.1–1.0)
Monocytes Relative: 10 %
Neutro Abs: 4.4 10*3/uL (ref 1.7–7.7)
Neutrophils Relative %: 61 %
Platelets: 279 10*3/uL (ref 150–400)
RBC: 4.42 MIL/uL (ref 4.22–5.81)
RDW: 13.8 % (ref 11.5–15.5)
WBC: 7.1 10*3/uL (ref 4.0–10.5)
nRBC: 0 % (ref 0.0–0.2)

## 2022-05-22 NOTE — Assessment & Plan Note (Addendum)
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 Stage: pT4aN3b-stage IVb.  HER-2 positive; androgen receptor positive.] Patient currently s/p adjuvant radiation-[finished July 16th. 2022].   # April 2023- PET scan- Clinically no evidence of recurrence;  Stable postsurgical changes in the left neck status post parotidectomy and nodal dissection. No evidence of local recurrence or metastatic disease; follow-up PET CT to be done in 06/2022 [facility].  Stable.  # proceed with Eligard today- every 6 months; premedication with Emla cream.    # CKD- stage III-  [1 BID; tylenol]- STABLE.   # Lymphedema neck s/p surgery-s/p  lymphedema treatment/prevention- STABLE.   *eligard q 6 M with josh down stairs; last 01/24/2022  # DISPOSITION:  # follow up in 2 months- MD labs- cbc/cmp; Eligard [down stairs with Josh]-Dr.B

## 2022-05-22 NOTE — Progress Notes (Signed)
[place] NOTE  Patient Care Team: Jinny Sanders, MD as PCP - General (Family Medicine) End, Harrell Gave, MD as PCP - Cardiology (Cardiology) Hayden Pedro, MD as Consulting Physician (Ophthalmology) Lorelee Cover., MD as Referring Physician (Ophthalmology) Oneta Rack, MD as Referring Physician (Dermatology) Ladene Artist, MD as Consulting Physician (Gastroenterology) Rosalene Billings., MD as Referring Physician (Dentistry) Debbora Dus, [facility] as Pharmacist (Pharmacist) Cammie Sickle, MD as Consulting Physician (Hematology and Oncology) Jinny Sanders, MD as Consulting Physician (Family Medicine)  CHIEF COMPLAINTS/PURPOSE OF CONSULTATION: parotid cancer  Oncology History Overview Note  Addendum    Upon clinician request, additional immunostains are performed at [facility] on block F2, with the following results in the tumor cells:   HER2 ([place], clone [id number]) Interpretation: Positive IHC Score: 3+ (using the ASCO/CAP breast biomarker scoring guidelines; there are no consensus guideline for scoring HER2 in salivary duct carcinoma)   This slide has also been reviewed by Dr. Shelby Mattocks [facility] breast pathology) who concurs.  Addendum electronically signed by Tomasa Blase, MD on 09/03/2020 at  3:21 PM  Diagnosis    A. "Tissue around stylomastoid foramen"  - Fibrovascular tissue extensively involved by carcinoma   B. "Main trunk facial nerve"  - Nerve and fibrous tissue extensively involved by carcinoma     C. "Distal facial nerve" - Nerve tissue; negative for carcinoma     D. "Proximal mastoid nerve margin" - Nerve tissue; negative for carcinoma   E: Left upper superficial parotid, parotidectomy - Benign parotid parenchyma with focal acute and chronic inflammation - Negative for carcinoma   F: Left inferior parotid with sternocleidomastoid muscle, resection - High-grade salivary gland carcinoma, most consistent with salivary  duct carcinoma (see comment and synoptic report for further details) - Cauterized tumor present at inked deep surgical margin within fibrous tissue adjacent to skeletal muscle - Metastatic carcinoma in 1.1 cm nodal conglomerate and 4 of 4 intact lymph nodes (at least 5/5); positive for extranodal extension    G: Lymph node, left neck level 5, lymphadenectomy - Benign adipose; no lymph nodes or carcinoma identified   H: Lymph node, left neck level 1, lymphadenectomy - Metastatic carcinoma in 1 of 5 lymph nodes (1/5); maximum size of metastasis 2 mm; negative for extranodal extension.   I: Left omohyoid, excision - Benign skeletal muscle; negative for carcinoma   J: Left deep lobe parotid, excision - Benign parotid tissue; negative for carcinoma   K: Parotid tissue, biopsy - Benign parotid tissue - No metastatic carcinoma identified in 1 lymph nodes (0/1)   L: Left tissue over mastoid, biopsy - Benign parotid parenchyma, dense fibrous tissue, and bone; negative for carcinoma   M: "Stylomastoid foramen stitch on proximal nerve", excision - Large nerve, negative for perineural/intraneural carcinoma; stitched margin negative - Attached fibrous tissue and parotid parenchyma positive for carcinoma, focally invading around but not within large nerve segment, with crushed/cauterized tumor present at tissue edges   N: Tissue over styloid process, biopsy - Benign parotid tissue; negative for carcinoma   O: Posterior digastric margin, biopsy - Benign skeletal muscle; negative for carcinoma   P: Left neck dissections level 1 through 5, lymphadenectomy - Metastatic carcinoma in 11 of 26 lymph nodes (11/26); maximum size of metastasis 20 mm; two nodes (16 mm and 14 mm) positive for extranodal extension (ENEma, up to 4 mm beyond capsule)   Diagnosis: 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 -----------------------------------------------------------------   07/13/10: Right parathyroidectomy Pathology: Hypercellular parathyroid gland   -------------------------------------------------------------------------------------   # MARCH 2022- CT chest- [[facility]] Subcentimeter pulmonary nodules measuring up to 0.8 cm, indeterminate. No comparison available. Attention on follow-up is recommended.  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 Stage: pT4aN3b-stage IVb.  HER-2 positive; androgen receptor positive.]  PET scan-negative for distant metastatic disease.   # CAD [card]; DM- 2; CKD-III     Cancer of parotid gland ([place])  09/11/2020 Initial Diagnosis   Cancer of parotid gland ([place])   09/16/2020 Cancer Staging   Staging form: Major Salivary Glands, AJCC 8th Edition - Pathologic: Stage IVB (pT4a, pN3b, cM0) - Signed by Cammie Sickle, MD on 09/16/2020    HISTORY OF PRESENTING ILLNESS: Accompanied by his friend. Ambulating independently.  Miguel Bradley 81 y.o.  male pleasant patient with locally advanced high-grade salivary gland/parotid cancer HER2 positive;AR positive-T4AN3-stage IVb is currently s/p adjuvant radiation; on Eligard q6M is here for follow-up.   Patient denies any concerns at this time .   Denies any weight loss.  No hot flashes.  Denies any significant joint pains or bone pain.  Chronic mild fatigue.  Denies any worsening swelling in the neck.  S/p physical therapy.  Review of Systems  Constitutional:   Positive for malaise/fatigue. Negative for chills, diaphoresis and fever.  HENT:  Negative for nosebleeds and sore throat.   Eyes:  Negative for double vision.  Respiratory:  Negative for cough, hemoptysis, sputum production, shortness of breath and wheezing.   Cardiovascular:  Negative for chest pain, palpitations, orthopnea and leg swelling.  Gastrointestinal:  Negative for abdominal pain, blood in stool, constipation, diarrhea, heartburn, melena, nausea and vomiting.  Genitourinary:  Negative for dysuria, frequency and urgency.  Musculoskeletal:  Positive for joint pain. Negative for back pain.  Skin: Negative.  Negative for itching and rash.  Neurological:  Negative for dizziness, tingling, focal weakness, weakness and headaches.  Endo/Heme/Allergies:  Does not bruise/bleed easily.  Psychiatric/Behavioral:  Negative for depression. The patient is not nervous/anxious and does not have insomnia.      MEDICAL HISTORY:  Past Medical History:  Diagnosis Date   Anemia    Aortic stenosis    Mild by most recent echo in 08/2021   Basal cell carcinoma of face    Cancer of parotid gland ([place]) 08/2020   s/p radical left parotidectomy and neck dissection and radiation   Cataract    Diabetes mellitus without complication ([place])    Erectile dysfunction 06/11/2014   Glaucoma    Hypercalcemia    Hyperparathyroidism, unspecified ([place])    Impotence of organic origin    Other testicular hypofunction    Pure hyperglyceridemia    Special screening for malignant neoplasm of prostate    Special screening for malignant neoplasms, colon    Unspecified essential hypertension     SURGICAL HISTORY: Past Surgical History:  Procedure Laterality Date   COLONOSCOPY  06/13/2018   Dr. Lucio Edward   LEFT HEART CATH AND CORONARY ANGIOGRAPHY Left 03/16/2020   Procedure: LEFT HEART CATH AND CORONARY ANGIOGRAPHY;  Surgeon:  End, Harrell Gave, MD;  Location: [facility];  Service: Cardiovascular;   Laterality: Left;   PARATHYROIDECTOMY  2012   PAROTIDECTOMY W/ NECK DISSECTION TOTAL Left 08/2020   [facility]   skin cancer removal     Dr. Sharlett Iles   TRIGGER FINGER RELEASE Left 2008    SOCIAL HISTORY: Social History   Socioeconomic History   Marital status: Married    Spouse name: Not on file   Number of children: 1   Years of education: Not on file   Highest education level: Not on file  Occupational History   Occupation: Water quality scientist at [employer] Use   Smoking status: Former    Years: 25.00    Types: Cigarettes    Quit date: 06/12/1978    Years since quitting: 43.9   Smokeless tobacco: Never  Vaping Use   Vaping Use: Never used  Substance and Sexual Activity   Alcohol use: Not Currently    Comment: 1 beer every few weeks   Drug use: No   Sexual activity: Not Currently  Other Topics Concern   Not on file  Social History Narrative   No exercise, plays golf. Lives in [place]; self- [place]; daughter. 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 Rare alcohol. Quit smoking in 1980.    Social Determinants of Health   Financial Resource Strain: Low Risk  (04/26/2021)   Overall Financial Resource Strain (CARDIA)    Difficulty of Paying Living Expenses: Not very hard  Food Insecurity: Not on file  Transportation Needs: Not on file  Physical Activity: Not on file  Stress: Not on file  Social Connections: Not on file  Intimate Partner Violence: Not on file    FAMILY HISTORY: Family History  Problem Relation Age of Onset   Hypertension Mother    Dementia Mother    Diabetes Other        aunt   Hypertension Brother    Hypertension Brother    Hypertension Brother    Hyperlipidemia Brother    Hyperlipidemia Brother    Hyperlipidemia Brother    Hypertension Sister    Bladder Cancer Sister    Hyperlipidemia Sister    Colon cancer Neg Hx    Esophageal cancer Neg Hx    Rectal cancer Neg Hx    Stomach cancer Neg Hx     ALLERGIES:  has No Known Allergies.  MEDICATIONS:  Current  Outpatient Medications  Medication Sig Dispense Refill   aspirin EC 81 MG tablet Take 81 mg by mouth daily.     dorzolamide-timolol (COSOPT) 22.3-6.8 MG/ML ophthalmic solution Place 1 drop into both eyes 2 (two) times daily.      fenofibrate 160 MG tablet TAKE 1 TABLET BY MOUTH EVERY DAY 90 tablet 3   hydrochlorothiazide (HYDRODIURIL) 25 MG tablet TAKE 1 TABLET BY MOUTH EVERY DAY 90 tablet 3   latanoprost (XALATAN) 0.005 % ophthalmic solution Place 1 drop into both eyes at bedtime.      metFORMIN (GLUCOPHAGE) 500 MG tablet TAKE ONE TABLET BY MOUTH TWICE DAILY WITH A MEAL 180 tablet 1   Multiple Vitamin (MULTIVITAMIN) tablet Take 1 tablet by mouth daily.     nitroGLYCERIN (NITROSTAT) 0.4 MG SL tablet Place 1 tablet (0.4 mg total) under the tongue every 5 (five) minutes as needed for chest pain. 25 tablet prn   Omega-3 Fatty Acids (FISH OIL) 1000 MG CAPS Take 1 capsule by mouth daily.     rosuvastatin (CRESTOR) 20 MG tablet TAKE 1 TABLET BY MOUTH EVERY  DAY 90 tablet 3   No current facility-administered medications for this visit.      Marland Kitchen  PHYSICAL EXAMINATION: ECOG PERFORMANCE STATUS: 0 - Asymptomatic  Vitals:   05/22/22 1024  BP: (!) 156/70  Pulse: (!) 46  Temp: 97.6 F (36.4 C)   Filed Weights   05/22/22 1024  Weight: 207 lb (93.9 kg)   Left neck sided soft  swelling noted.   Physical Exam HENT:     Head: Normocephalic and atraumatic.     Mouth/Throat:     Pharynx: No oropharyngeal exudate.  Eyes:     Pupils: Pupils are equal, round, and reactive to light.  Neck:     Comments: Incision from the left neck dissection; parotidectomy noted.  Well-healing.  No evidence of any infection. Cardiovascular:     Rate and Rhythm: Normal rate and regular rhythm.  Pulmonary:     Effort: No respiratory distress.     Breath sounds: No wheezing.  Abdominal:     General: Bowel sounds are normal. There is no distension.     Palpations: Abdomen is soft. There is no mass.      Tenderness: There is no abdominal tenderness. There is no guarding or rebound.  Musculoskeletal:        General: No tenderness. Normal range of motion.     Cervical back: Normal range of motion and neck supple.  Skin:    General: Skin is warm.  Neurological:     Mental Status: He is alert and oriented to person, place, and time.     Comments: Left facial droop noted-post surgery.  Psychiatric:        Mood and Affect: Affect normal.      LABORATORY DATA:  I have reviewed the data as listed Lab Results  Component Value Date   WBC 7.1 05/22/2022   HGB 13.1 05/22/2022   HCT 41.0 05/22/2022   MCV 92.8 05/22/2022   PLT 279 05/22/2022   Recent Labs    10/24/21 0947 01/24/22 1014 05/02/22 0811 05/22/22 0947  NA 134* 138 139 135  K 3.6 3.8 4.0 3.8  CL 104 106 105 100  CO2 _0 GLUCOSE 139* 126* 125* 131*  BUN 24* _1 CREATININE 1.23 1.13 1.17 1.08  CALCIUM 8.9 9.3 9.4 8.6*  GFRNONAA 59* >60  --  >60  PROT 7.1 7.3 6.6 6.8  ALBUMIN 3.8 3.8 4.1 3.6  AST _2 ALT _3 ALKPHOS 52 57 70 68  BILITOT 0.6 0.3 0.4 0.3    RADIOGRAPHIC STUDIES: I have personally reviewed the radiological images as listed and agreed with the findings in the report. No results found.  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 Stage: pT4aN3b-stage IVb.  HER-2 positive; androgen receptor positive.] Patient currently s/p adjuvant radiation-[finished July 16th. 2022].   # April 2023- PET scan- Clinically no evidence of recurrence;  Stable postsurgical changes in the left neck status post parotidectomy and nodal dissection. No evidence of local recurrence or metastatic disease; follow-up PET CT to be  done in 06/2022 [facility].  Stable.  # proceed with Eligard today- every 6 months; premedication with Emla cream.    # CKD- stage III-  [  1 BID; tylenol]- STABLE.   # Lymphedema neck s/p surgery-s/p  lymphedema treatment/prevention- STABLE.   *eligard q 26 M with josh down stairs; last 01/24/2022  # DISPOSITION:  # follow up in 2 months- MD labs- cbc/cmp; Eligard [down stairs with Josh]-Dr.B          All questions were answered. The patient knows to call the clinic with any problems, questions or concerns.    Cammie Sickle, MD 05/22/2022 2:39 PM

## 2022-05-22 NOTE — Progress Notes (Signed)
Patient denies any concerns at this time 

## 2022-06-08 DIAGNOSIS — R471 Dysarthria and anarthria: Secondary | ICD-10-CM | POA: Diagnosis not present

## 2022-06-08 DIAGNOSIS — C07 Malignant neoplasm of parotid gland: Secondary | ICD-10-CM | POA: Diagnosis not present

## 2022-06-08 DIAGNOSIS — G510 Bell's palsy: Secondary | ICD-10-CM | POA: Diagnosis not present

## 2022-06-08 DIAGNOSIS — K117 Disturbances of salivary secretion: Secondary | ICD-10-CM | POA: Diagnosis not present

## 2022-06-08 DIAGNOSIS — K131 Cheek and lip biting: Secondary | ICD-10-CM | POA: Diagnosis not present

## 2022-06-16 ENCOUNTER — Ambulatory Visit: Payer: PPO | Attending: Cardiology | Admitting: Cardiology

## 2022-06-16 ENCOUNTER — Encounter: Payer: Self-pay | Admitting: Cardiology

## 2022-06-16 VITALS — BP 130/60 | HR 48 | Ht 70.0 in | Wt 203.5 lb

## 2022-06-16 DIAGNOSIS — E785 Hyperlipidemia, unspecified: Secondary | ICD-10-CM

## 2022-06-16 DIAGNOSIS — E1169 Type 2 diabetes mellitus with other specified complication: Secondary | ICD-10-CM

## 2022-06-16 DIAGNOSIS — I2510 Atherosclerotic heart disease of native coronary artery without angina pectoris: Secondary | ICD-10-CM | POA: Diagnosis not present

## 2022-06-16 DIAGNOSIS — I350 Nonrheumatic aortic (valve) stenosis: Secondary | ICD-10-CM | POA: Diagnosis not present

## 2022-06-16 DIAGNOSIS — I10 Essential (primary) hypertension: Secondary | ICD-10-CM

## 2022-06-16 NOTE — Patient Instructions (Signed)
Medication Instructions:  No changes *If you need a refill on your cardiac medications before your next appointment, please call your pharmacy*   Lab Work: None ordered If you have labs (blood work) drawn today and your tests are completely normal, you will receive your results only by: [place] (if you have MyChart) OR A paper copy in the mail If you have any lab test that is abnormal or we need to change your treatment, we will call you to review the results.   Testing/Procedures: None ordered   Follow-Up: At [facility], you and your health needs are our priority.  As part of our continuing mission to provide you with exceptional heart care, we have created designated Provider Care Teams.  These Care Teams include your primary Cardiologist (physician) and Advanced Practice Providers (APPs -  Physician Assistants and Nurse Practitioners) who all work together to provide you with the care you need, when you need it.  We recommend signing up for the patient portal called "MyChart".  Sign up information is provided on this After Visit Summary.  MyChart is used to connect with patients for Virtual Visits (Telemedicine).  Patients are able to view lab/test results, encounter notes, upcoming appointments, etc.  Non-urgent messages can be sent to your provider as well.   To learn more about what you can do with MyChart, go to [email or web address].    Your next appointment:   6 month(s)  The format for your next appointment:   In Person  Provider:   You may see Nelva Bush, MD or one of the following Advanced Practice Providers on your designated Care Team:   Murray Hodgkins, NP Christell Faith, PA-C Cadence Kathlen Mody, PA-C Gerrie Nordmann, NP

## 2022-06-16 NOTE — Progress Notes (Signed)
Cardiology Clinic Note   Patient Name: Miguel Bradley Date of Encounter: 06/16/2022  Primary Care Provider:  Jinny Sanders, MD Primary Cardiologist:  Nelva Bush, MD  Patient Profile    82 year old male with a past medical history of severe single-vessel coronary artery disease, mild aortic stenosis, left carotid artery occlusion, and parotid cancer, who presents for follow-up of his coronary artery disease.  Past Medical History    Past Medical History:  Diagnosis Date   Anemia    Aortic stenosis    Mild by most recent echo in 08/2021   Basal cell carcinoma of face    Cancer of parotid gland ([place]) 08/2020   s/p radical left parotidectomy and neck dissection and radiation   Cataract    Diabetes mellitus without complication ([place])    Erectile dysfunction 06/11/2014   Glaucoma    Hypercalcemia    Hyperparathyroidism, unspecified ([place])    Impotence of organic origin    Other testicular hypofunction    Pure hyperglyceridemia    Special screening for malignant neoplasm of prostate    Special screening for malignant neoplasms, colon    Unspecified essential hypertension    Past Surgical History:  Procedure Laterality Date   COLONOSCOPY  06/13/2018   Dr. Lucio Edward   LEFT HEART CATH AND CORONARY ANGIOGRAPHY Left 03/16/2020   Procedure: LEFT HEART CATH AND CORONARY ANGIOGRAPHY;  Surgeon: Nelva Bush, MD;  Location: [facility];  Service: Cardiovascular;  Laterality: Left;   PARATHYROIDECTOMY  2012   PAROTIDECTOMY W/ NECK DISSECTION TOTAL Left 08/2020   [facility]   skin cancer removal     Dr. Sharlett Iles   TRIGGER FINGER RELEASE Left 2008    Allergies  No Known Allergies  History of Present Illness    Miguel Bradley is an 82 year old male with previously mentioned past medical history of severe single vessel coronary artery disease with an 80-90% distal RCA stenosis, mild aortic stenosis, left carotid artery occlusion, hypertension, hyperlipidemia,  type 2 diabetes, and parotid cancer.   Echocardiogram completed 08/16/2021 with an LVEF of 55 to 60%, no regional wall motion abnormalities, G1 DD, mild to moderately dilated left atrial size, severe calcification of the aortic valve, aortic valve regurgitation is visualized but he has mild aortic valve stenosis.  Carotid duplex revealed stable chronic left ICA occlusion.  He was last seen in clinic 10/07/2021 by Dr. Saunders Revel.  He stated overall he was feeling.  Blood pressures are typically well-controlled with rare findings of anything above 591 systolic.  Recent PET/CT scan did not show any evidence of recurrent malignancy.  The medication changes made or any further testing.  He returns to clinic today stating that he has been feeling fairly well.  Patient recently had procedure done on his face and is finished up antibiotic therapy and still has stitches intact.  He denies any chest pain, shortness of breath, palpitations, or peripheral edema.  He denies any emergency department visits overnight hospital stays.  He does have upcoming PET scan to be completed and to [facility] as he stated his oncologist wanted his scan done there at this time but he was unsure of the reason. Home Medications    Current Outpatient Medications  Medication Sig Dispense Refill   aspirin EC 81 MG tablet Take 81 mg by mouth daily.     cephALEXin (KEFLEX) 500 MG capsule Take 500 mg by mouth 2 (two) times daily.     dorzolamide-timolol (COSOPT) 22.3-6.8 MG/ML ophthalmic solution  Place 1 drop into both eyes 2 (two) times daily.      fenofibrate 160 MG tablet TAKE 1 TABLET BY MOUTH EVERY DAY 90 tablet 3   hydrochlorothiazide (HYDRODIURIL) 25 MG tablet TAKE 1 TABLET BY MOUTH EVERY DAY 90 tablet 3   latanoprost (XALATAN) 0.005 % ophthalmic solution Place 1 drop into both eyes at bedtime.      metFORMIN (GLUCOPHAGE) 500 MG tablet TAKE ONE TABLET BY MOUTH TWICE DAILY WITH A MEAL 180 tablet 1   Multiple Vitamin (MULTIVITAMIN) tablet Take 1  tablet by mouth daily.     nitroGLYCERIN (NITROSTAT) 0.4 MG SL tablet Place 1 tablet (0.4 mg total) under the tongue every 5 (five) minutes as needed for chest pain. 25 tablet prn   Omega-3 Fatty Acids (FISH OIL) 1000 MG CAPS Take 1 capsule by mouth daily.     rosuvastatin (CRESTOR) 20 MG tablet TAKE 1 TABLET BY MOUTH EVERY DAY 90 tablet 3   No current facility-administered medications for this visit.     Family History    Family History  Problem Relation Age of Onset   Hypertension Mother    Dementia Mother    Diabetes Other        aunt   Hypertension Brother    Hypertension Brother    Hypertension Brother    Hyperlipidemia Brother    Hyperlipidemia Brother    Hyperlipidemia Brother    Hypertension Sister    Bladder Cancer Sister    Hyperlipidemia Sister    Colon cancer Neg Hx    Esophageal cancer Neg Hx    Rectal cancer Neg Hx    Stomach cancer Neg Hx    He indicated that his mother is deceased. He indicated that his father is deceased. He indicated that the status of his neg hx is unknown. He indicated that the status of his other is unknown.  Social History    Social History   Socioeconomic History   Marital status: Married    Spouse name: Not on file   Number of children: 1   Years of education: Not on file   Highest education level: Not on file  Occupational History   Occupation: Water quality scientist at [employer]   Smoking status: Former    Years: 25.00    Types: Cigarettes    Quit date: 06/12/1978    Years since quitting: 44.0   Smokeless tobacco: Never  Vaping Use   Vaping Use: Never used  Substance and Sexual Activity   Alcohol use: Not Currently    Comment: 1 beer every few weeks   Drug use: No   Sexual activity: Not Currently  Other Topics Concern   Not on file  Social History Narrative   No exercise, plays golf. Lives in [place]; self- [place]; daughter. 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 Rare alcohol. Quit smoking in 1980.    Social Determinants of  Health   Financial Resource Strain: Low Risk  (04/26/2021)   Overall Financial Resource Strain (CARDIA)    Difficulty of Paying Living Expenses: Not very hard  Food Insecurity: Not on file  Transportation Needs: Not on file  Physical Activity: Not on file  Stress: Not on file  Social Connections: Not on file  Intimate Partner Violence: Not on file     Review of Systems    General:  No chills, fever, night sweats or weight changes.  Cardiovascular:  No chest pain, dyspnea on exertion, edema, orthopnea, palpitations, paroxysmal nocturnal dyspnea. Dermatological: No rash,  lesions/masses, endorses sutures from recent surgical procedure and and discomfort in area Respiratory: No cough, dyspnea Urologic: No hematuria, dysuria Abdominal:   No nausea, vomiting, diarrhea, bright red blood per rectum, melena, or hematemesis Neurologic:  No visual changes, wkns, changes in mental status. All other systems reviewed and are otherwise negative except as noted above.   Physical Exam    VS:  BP 130/60 (BP Location: Left Arm, Patient Position: Sitting, Cuff Size: Normal)   Pulse (!) 48   Ht '5\' 10"'$  (1.778 m)   Wt 203 lb 8 oz (92.3 kg)   SpO2 98%   BMI 29.20 kg/m  , BMI Body mass index is 29.2 kg/m.     GEN: Well nourished, well developed, in no acute distress. HEENT: normal.  Glasses on Neck: Supple, no JVD, carotid bruits, or masses. Cardiac: RRR, II/VI systolic murmur, without rubs, or gallops. No clubbing, cyanosis, edema.  Radials 2+/PT 2+ and equal bilaterally.  Respiratory:  Respirations regular and unlabored, clear to auscultation bilaterally. GI: Soft, nontender, nondistended, BS + x 4. MS: no deformity or atrophy. Skin: warm and dry, no rash.  Surgical incision to the left side of his lip with sutures intact Neuro:  Strength and sensation are intact. Psych: Normal affect.  Accessory Clinical Findings    ECG personally reviewed by me today-sinus bradycardia with a rate of 48,  nonspecific intraventricular block, LVH, left axis deviation- No acute changes  Lab Results  Component Value Date   WBC 7.1 05/22/2022   HGB 13.1 05/22/2022   HCT 41.0 05/22/2022   MCV 92.8 05/22/2022   PLT 279 05/22/2022   Lab Results  Component Value Date   CREATININE 1.08 05/22/2022   BUN 22 05/22/2022   NA 135 05/22/2022   K 3.8 05/22/2022   CL 100 05/22/2022   CO2 26 05/22/2022   Lab Results  Component Value Date   ALT 16 05/22/2022   AST 26 05/22/2022   ALKPHOS 68 05/22/2022   BILITOT 0.3 05/22/2022   Lab Results  Component Value Date   CHOL 136 05/02/2022   HDL 42.90 05/02/2022   LDLCALC 58 05/02/2022   LDLDIRECT 70.0 12/31/2020   TRIG 177.0 (H) 05/02/2022   CHOLHDL 3 05/02/2022    Lab Results  Component Value Date   HGBA1C 6.6 (H) 05/02/2022    Assessment & Plan   1.  Coronary artery disease with a history of severe single-vessel with a 80 to 90% distal RCA stenosis.  He remains chest pain-free today.  EKG reveals no ischemic changes.  Will continue with aggressive medical therapy to prevent progression of disease including aspirin, rosuvastatin, fenofibrate, and omega-3 fatty acids.  2.  Aortic stenosis with mild aortic stenosis noted on echocardiogram.  Heart murmur is appreciated on exam.  Will need repeat follow-up echoes every 3 to 5 years for surveillance unless symptomatic.  3.  Hypertension with a blood pressure today 130/60 that has remained stable.  He is continued on HCTZ 25 mg daily.  4.  Hyperlipidemia associated with type 2 diabetes he is continued on combination of rosuvastatin, fenofibrate, and omega-3 fatty acids with his last LDL 58 in 05/02/2022.  (Continue to be followed by his PCP.  5.  Carotid artery occlusion with chronic occlusion found in the left internal carotid artery on carotid duplex.  Mild plaque seen on the right without hemodynamically significant stenosis.  He is asymptomatic.  He has continued on aspirin and statin therapy  with repeat Dopplers scheduled yearly with  his upcoming duplex scheduled 08/17/2022.  6.  Disposition patient is return to clinic to see MD/APP in 6 months or sooner if needed.  Roshaun Pound, NP 06/16/2022, 2:51 PM

## 2022-07-12 DIAGNOSIS — X32XXXA Exposure to sunlight, initial encounter: Secondary | ICD-10-CM | POA: Diagnosis not present

## 2022-07-12 DIAGNOSIS — D2272 Melanocytic nevi of left lower limb, including hip: Secondary | ICD-10-CM | POA: Diagnosis not present

## 2022-07-12 DIAGNOSIS — D225 Melanocytic nevi of trunk: Secondary | ICD-10-CM | POA: Diagnosis not present

## 2022-07-12 DIAGNOSIS — L821 Other seborrheic keratosis: Secondary | ICD-10-CM | POA: Diagnosis not present

## 2022-07-12 DIAGNOSIS — D2261 Melanocytic nevi of right upper limb, including shoulder: Secondary | ICD-10-CM | POA: Diagnosis not present

## 2022-07-12 DIAGNOSIS — D0439 Carcinoma in situ of skin of other parts of face: Secondary | ICD-10-CM | POA: Diagnosis not present

## 2022-07-12 DIAGNOSIS — L570 Actinic keratosis: Secondary | ICD-10-CM | POA: Diagnosis not present

## 2022-07-12 DIAGNOSIS — D485 Neoplasm of uncertain behavior of skin: Secondary | ICD-10-CM | POA: Diagnosis not present

## 2022-07-12 DIAGNOSIS — D2262 Melanocytic nevi of left upper limb, including shoulder: Secondary | ICD-10-CM | POA: Diagnosis not present

## 2022-07-12 DIAGNOSIS — D2271 Melanocytic nevi of right lower limb, including hip: Secondary | ICD-10-CM | POA: Diagnosis not present

## 2022-07-19 DIAGNOSIS — C760 Malignant neoplasm of head, face and neck: Secondary | ICD-10-CM | POA: Diagnosis not present

## 2022-07-19 DIAGNOSIS — G510 Bell's palsy: Secondary | ICD-10-CM | POA: Diagnosis not present

## 2022-07-19 DIAGNOSIS — C089 Malignant neoplasm of major salivary gland, unspecified: Secondary | ICD-10-CM | POA: Diagnosis not present

## 2022-07-19 DIAGNOSIS — H02234 Paralytic lagophthalmos left upper eyelid: Secondary | ICD-10-CM | POA: Diagnosis not present

## 2022-07-19 DIAGNOSIS — C07 Malignant neoplasm of parotid gland: Secondary | ICD-10-CM | POA: Diagnosis not present

## 2022-07-21 ENCOUNTER — Inpatient Hospital Stay: Payer: PPO

## 2022-07-21 ENCOUNTER — Inpatient Hospital Stay: Payer: PPO | Attending: Internal Medicine

## 2022-07-21 ENCOUNTER — Inpatient Hospital Stay (HOSPITAL_BASED_OUTPATIENT_CLINIC_OR_DEPARTMENT_OTHER): Payer: PPO | Admitting: Internal Medicine

## 2022-07-21 VITALS — BP 153/71 | HR 50 | Temp 96.8°F | Resp 16 | Wt 205.2 lb

## 2022-07-21 DIAGNOSIS — Z923 Personal history of irradiation: Secondary | ICD-10-CM | POA: Diagnosis not present

## 2022-07-21 DIAGNOSIS — I129 Hypertensive chronic kidney disease with stage 1 through stage 4 chronic kidney disease, or unspecified chronic kidney disease: Secondary | ICD-10-CM | POA: Diagnosis not present

## 2022-07-21 DIAGNOSIS — Z85858 Personal history of malignant neoplasm of other endocrine glands: Secondary | ICD-10-CM | POA: Insufficient documentation

## 2022-07-21 DIAGNOSIS — Z7984 Long term (current) use of oral hypoglycemic drugs: Secondary | ICD-10-CM | POA: Insufficient documentation

## 2022-07-21 DIAGNOSIS — Z79818 Long term (current) use of other agents affecting estrogen receptors and estrogen levels: Secondary | ICD-10-CM | POA: Diagnosis not present

## 2022-07-21 DIAGNOSIS — N183 Chronic kidney disease, stage 3 unspecified: Secondary | ICD-10-CM | POA: Insufficient documentation

## 2022-07-21 DIAGNOSIS — Z79899 Other long term (current) drug therapy: Secondary | ICD-10-CM | POA: Insufficient documentation

## 2022-07-21 DIAGNOSIS — Z7982 Long term (current) use of aspirin: Secondary | ICD-10-CM | POA: Insufficient documentation

## 2022-07-21 DIAGNOSIS — E1122 Type 2 diabetes mellitus with diabetic chronic kidney disease: Secondary | ICD-10-CM | POA: Diagnosis not present

## 2022-07-21 DIAGNOSIS — C07 Malignant neoplasm of parotid gland: Secondary | ICD-10-CM

## 2022-07-21 DIAGNOSIS — R911 Solitary pulmonary nodule: Secondary | ICD-10-CM | POA: Insufficient documentation

## 2022-07-21 DIAGNOSIS — Z87891 Personal history of nicotine dependence: Secondary | ICD-10-CM | POA: Insufficient documentation

## 2022-07-21 DIAGNOSIS — Z5111 Encounter for antineoplastic chemotherapy: Secondary | ICD-10-CM | POA: Diagnosis not present

## 2022-07-21 LAB — COMPREHENSIVE METABOLIC PANEL
ALT: 17 U/L (ref 0–44)
AST: 27 U/L (ref 15–41)
Albumin: 3.9 g/dL (ref 3.5–5.0)
Alkaline Phosphatase: 67 U/L (ref 38–126)
Anion gap: 9 (ref 5–15)
BUN: 27 mg/dL — ABNORMAL HIGH (ref 8–23)
CO2: 26 mmol/L (ref 22–32)
Calcium: 9.1 mg/dL (ref 8.9–10.3)
Chloride: 100 mmol/L (ref 98–111)
Creatinine, Ser: 1.13 mg/dL (ref 0.61–1.24)
GFR, Estimated: >60 mL/min (ref >60)
Glucose, Bld: 110 mg/dL — ABNORMAL HIGH (ref 70–99)
Potassium: 4 mmol/L (ref 3.5–5.1)
Sodium: 135 mmol/L (ref 135–145)
Total Bilirubin: 0.4 mg/dL (ref 0.3–1.2)
Total Protein: 7.5 g/dL (ref 6.5–8.1)

## 2022-07-21 LAB — CBC WITH DIFFERENTIAL/PLATELET
Abs Immature Granulocytes: 0.02 10*3/uL (ref 0.00–0.07)
Basophils Absolute: 0.1 10*3/uL (ref 0.0–0.1)
Basophils Relative: 1 %
Eosinophils Absolute: 0.2 10*3/uL (ref 0.0–0.5)
Eosinophils Relative: 3 %
HCT: 39.9 % (ref 39.0–52.0)
Hemoglobin: 13.1 g/dL (ref 13.0–17.0)
Immature Granulocytes: 0 %
Lymphocytes Relative: 19 %
Lymphs Abs: 1.4 10*3/uL (ref 0.7–4.0)
MCH: 30 pg (ref 26.0–34.0)
MCHC: 32.8 g/dL (ref 30.0–36.0)
MCV: 91.5 fL (ref 80.0–100.0)
Monocytes Absolute: 0.6 10*3/uL (ref 0.1–1.0)
Monocytes Relative: 9 %
Neutro Abs: 4.9 10*3/uL (ref 1.7–7.7)
Neutrophils Relative %: 68 %
Platelets: 280 10*3/uL (ref 150–400)
RBC: 4.36 MIL/uL (ref 4.22–5.81)
RDW: 13.8 % (ref 11.5–15.5)
WBC: 7.2 10*3/uL (ref 4.0–10.5)
nRBC: 0 % (ref 0.0–0.2)

## 2022-07-21 MED ORDER — LEUPROLIDE ACETATE (6 MONTH) 45 MG ~~LOC~~ KIT
45.0000 mg | PACK | Freq: Once | SUBCUTANEOUS | Status: AC
  Administered 2022-07-21: 45 mg via SUBCUTANEOUS

## 2022-07-21 NOTE — Assessment & Plan Note (Addendum)
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 Stage: [id number]-stage IVb.  HER-2 positive; androgen receptor positive.] Patient currently s/p adjuvant radiation-[finished July 16th. 2022].   # PET scan: FEB 8th, 2024- Postsurgical changes of the left head and neck. Mild, questionably increased uptake in level 1A nodes; recommend attention on follow-up. No other suspicious hypermetabolic foci to suggest disease recurrence or metastasis. would recommend imaging in 3 months. Prefer [facility] imaging; if not will have done locally.  Recommend reaching out to Dr. Danton Sewer office regarding imaging.  # RUL lung nodule- 0.5 cm [PET FEB 2024; [facility]]; not present on PET in April, 2023- would recommend imaging in 3 months. Prefer [facility] imaging; if not will have done locally.   # Injection site pain -premedication with Emla cream.    # CKD- stage III-  [1 BID; tylenol]- stable  # Lymphedema neck s/p surgery-s/p  lymphedema treatment/prevention- STABLE.   *eligard q 6 M-last 07/21/2022  # DISPOSITION:  # Eligard today # follow up in 3 months- MD labs- cbc/cmp; Dr.B

## 2022-07-21 NOTE — Progress Notes (Signed)
[place] NOTE  Patient Care Team: Jinny Sanders, MD as PCP - General (Family Medicine) End, Harrell Gave, MD as PCP - Cardiology (Cardiology) Hayden Pedro, MD as Consulting Physician (Ophthalmology) Lorelee Cover., MD as Referring Physician (Ophthalmology) Oneta Rack, MD as Referring Physician (Dermatology) Ladene Artist, MD as Consulting Physician (Gastroenterology) Rosalene Billings., MD as Referring Physician (Dentistry) Debbora Dus, [facility] as Pharmacist (Pharmacist) Cammie Sickle, MD as Consulting Physician (Hematology and Oncology) Jinny Sanders, MD as Consulting Physician (Family Medicine)  CHIEF COMPLAINTS/PURPOSE OF CONSULTATION: parotid cancer  Oncology History Overview Note  Addendum    Upon clinician request, additional immunostains are performed at [facility] on block F2, with the following results in the tumor cells:   HER2 ([place], clone [id number]) Interpretation: Positive IHC Score: 3+ (using the ASCO/CAP breast biomarker scoring guidelines; there are no consensus guideline for scoring HER2 in salivary duct carcinoma)   This slide has also been reviewed by Dr. Shelby Mattocks [facility] breast pathology) who concurs.  Addendum electronically signed by Tomasa Blase, MD on 09/03/2020 at  3:21 PM  Diagnosis    A. "Tissue around stylomastoid foramen"  - Fibrovascular tissue extensively involved by carcinoma   B. "Main trunk facial nerve"  - Nerve and fibrous tissue extensively involved by carcinoma     C. "Distal facial nerve" - Nerve tissue; negative for carcinoma     D. "Proximal mastoid nerve margin" - Nerve tissue; negative for carcinoma   E: Left upper superficial parotid, parotidectomy - Benign parotid parenchyma with focal acute and chronic inflammation - Negative for carcinoma   F: Left inferior parotid with sternocleidomastoid muscle, resection - High-grade salivary gland carcinoma, most consistent with salivary  duct carcinoma (see comment and synoptic report for further details) - Cauterized tumor present at inked deep surgical margin within fibrous tissue adjacent to skeletal muscle - Metastatic carcinoma in 1.1 cm nodal conglomerate and 4 of 4 intact lymph nodes (at least 5/5); positive for extranodal extension    G: Lymph node, left neck level 5, lymphadenectomy - Benign adipose; no lymph nodes or carcinoma identified   H: Lymph node, left neck level 1, lymphadenectomy - Metastatic carcinoma in 1 of 5 lymph nodes (1/5); maximum size of metastasis 2 mm; negative for extranodal extension.   I: Left omohyoid, excision - Benign skeletal muscle; negative for carcinoma   J: Left deep lobe parotid, excision - Benign parotid tissue; negative for carcinoma   K: Parotid tissue, biopsy - Benign parotid tissue - No metastatic carcinoma identified in 1 lymph nodes (0/1)   L: Left tissue over mastoid, biopsy - Benign parotid parenchyma, dense fibrous tissue, and bone; negative for carcinoma   M: "Stylomastoid foramen stitch on proximal nerve", excision - Large nerve, negative for perineural/intraneural carcinoma; stitched margin negative - Attached fibrous tissue and parotid parenchyma positive for carcinoma, focally invading around but not within large nerve segment, with crushed/cauterized tumor present at tissue edges   N: Tissue over styloid process, biopsy - Benign parotid tissue; negative for carcinoma   O: Posterior digastric margin, biopsy - Benign skeletal muscle; negative for carcinoma   P: Left neck dissections level 1 through 5, lymphadenectomy - Metastatic carcinoma in 11 of 26 lymph nodes (11/26); maximum size of metastasis 20 mm; two nodes (16 mm and 14 mm) positive for extranodal extension (ENEma, up to 4 mm beyond capsule)   Diagnosis: 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 -----------------------------------------------------------------   07/13/10: Right parathyroidectomy Pathology: Hypercellular parathyroid gland   -------------------------------------------------------------------------------------   # MARCH 2022- CT chest- [[facility]] Subcentimeter pulmonary nodules measuring up to 0.8 cm, indeterminate. No comparison available. Attention on follow-up is recommended.  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 Stage: pT4aN3b-stage IVb.  HER-2 positive; androgen receptor positive.]  PET scan-negative for distant metastatic disease.   # CAD [card]; DM- 2; CKD-III     Cancer of parotid gland ([place])  09/11/2020 Initial Diagnosis   Cancer of parotid gland ([place])   09/16/2020 Cancer Staging   Staging form: Major Salivary Glands, AJCC 8th Edition - Pathologic: Stage IVB (pT4a, pN3b, cM0) - Signed by Cammie Sickle, MD on 09/16/2020   HISTORY OF PRESENTING ILLNESS: Accompanied by his friend. Ambulating independently.  Miguel Bradley 82 y.o.  male pleasant patient with locally advanced high-grade salivary gland/parotid cancer HER2 positive;AR positive-T4AN3-stage IVb is currently s/p adjuvant radiation; on Eligard q6M is here for follow-up.   Pt in for follow up, reports had PET scan yesterday at [facility]; s/p evaluation with ENT, Dr.Blumberg   Denies any weight loss.  No hot flashes.  Denies any significant joint pains or bone pain.  Chronic mild fatigue.  Denies any worsening swelling in the neck.  S/p  physical therapy.  Review of Systems  Constitutional:  Positive for malaise/fatigue. Negative for chills, diaphoresis and fever.  HENT:  Negative for nosebleeds and sore throat.   Eyes:  Negative for double vision.  Respiratory:  Negative for cough, hemoptysis, sputum production, shortness of breath and wheezing.   Cardiovascular:  Negative for chest pain, palpitations, orthopnea and leg swelling.  Gastrointestinal:  Negative for abdominal pain, blood in stool, constipation, diarrhea, heartburn, melena, nausea and vomiting.  Genitourinary:  Negative for dysuria, frequency and urgency.  Musculoskeletal:  Positive for joint pain. Negative for back pain.  Skin: Negative.  Negative for itching and rash.  Neurological:  Negative for dizziness, tingling, focal weakness, weakness and headaches.  Endo/Heme/Allergies:  Does not bruise/bleed easily.  Psychiatric/Behavioral:  Negative for depression. The patient is not nervous/anxious and does not have insomnia.      MEDICAL HISTORY:  Past Medical History:  Diagnosis Date   Anemia    Aortic stenosis    Mild by most recent echo in 08/2021   Basal cell carcinoma of face    Cancer of parotid gland ([place]) 08/2020   s/p radical left parotidectomy and neck dissection and radiation   Cataract    Diabetes mellitus without complication ([place])    Erectile dysfunction 06/11/2014   Glaucoma    Hypercalcemia    Hyperparathyroidism, unspecified ([place])    Impotence of organic origin    Other testicular hypofunction    Pure hyperglyceridemia    Special screening for malignant neoplasm of prostate    Special screening for malignant neoplasms, colon    Unspecified essential hypertension     SURGICAL HISTORY: Past Surgical History:  Procedure Laterality Date   COLONOSCOPY  06/13/2018   Dr. Lucio Edward   LEFT HEART CATH AND CORONARY ANGIOGRAPHY Left 03/16/2020   Procedure:  LEFT HEART CATH AND CORONARY ANGIOGRAPHY;  Surgeon: Nelva Bush, MD;   Location: [facility];  Service: Cardiovascular;  Laterality: Left;   PARATHYROIDECTOMY  2012   PAROTIDECTOMY W/ NECK DISSECTION TOTAL Left 08/2020   [facility]   skin cancer removal     Dr. Sharlett Iles   TRIGGER FINGER RELEASE Left 2008    SOCIAL HISTORY: Social History   Socioeconomic History   Marital status: Married    Spouse name: Not on file   Number of children: 1   Years of education: Not on file   Highest education level: Not on file  Occupational History   Occupation: Water quality scientist at [employer] Use   Smoking status: Former    Years: 25.00    Types: Cigarettes    Quit date: 06/12/1978    Years since quitting: 44.1   Smokeless tobacco: Never  Vaping Use   Vaping Use: Never used  Substance and Sexual Activity   Alcohol use: Not Currently    Comment: 1 beer every few weeks   Drug use: No   Sexual activity: Not Currently  Other Topics Concern   Not on file  Social History Narrative   No exercise, plays golf. Lives in [place]; self- [place]; daughter. puchasing dept in [place]. Rare alcohol. Quit smoking in 1980.    Social Determinants of Health   Financial Resource Strain: Low Risk  (04/26/2021)   Overall Financial Resource Strain (CARDIA)    Difficulty of Paying Living Expenses: Not very hard  Food Insecurity: Not on file  Transportation Needs: Not on file  Physical Activity: Not on file  Stress: Not on file  Social Connections: Not on file  Intimate Partner Violence: Not on file    FAMILY HISTORY: Family History  Problem Relation Age of Onset   Hypertension Mother    Dementia Mother    Diabetes Other        aunt   Hypertension Brother    Hypertension Brother    Hypertension Brother    Hyperlipidemia Brother    Hyperlipidemia Brother    Hyperlipidemia Brother    Hypertension Sister    Bladder Cancer Sister    Hyperlipidemia Sister    Colon cancer Neg Hx    Esophageal cancer Neg Hx    Rectal cancer Neg Hx    Stomach cancer Neg Hx      ALLERGIES:  has No Known Allergies.  MEDICATIONS:  Current Outpatient Medications  Medication Sig Dispense Refill   aspirin EC 81 MG tablet Take 81 mg by mouth daily.     dorzolamide-timolol (COSOPT) 22.3-6.8 MG/ML ophthalmic solution Place 1 drop into both eyes 2 (two) times daily.      fenofibrate 160 MG tablet TAKE 1 TABLET BY MOUTH EVERY DAY 90 tablet 3   hydrochlorothiazide (HYDRODIURIL) 25 MG tablet TAKE 1 TABLET BY MOUTH EVERY DAY 90 tablet 3   latanoprost (XALATAN) 0.005 % ophthalmic solution Place 1 drop into both eyes at bedtime.      metFORMIN (GLUCOPHAGE) 500 MG tablet TAKE ONE TABLET BY MOUTH TWICE DAILY WITH A MEAL 180 tablet 1   Multiple Vitamin (MULTIVITAMIN) tablet Take 1 tablet by mouth daily.     Omega-3 Fatty Acids (FISH OIL) 1000 MG CAPS Take 1 capsule by mouth daily.     rosuvastatin (CRESTOR) 20 MG tablet TAKE 1 TABLET BY MOUTH EVERY DAY 90 tablet 3   nitroGLYCERIN (NITROSTAT) 0.4 MG SL tablet Place 1 tablet (0.4 mg total) under the tongue  every 5 (five) minutes as needed for chest pain. (Patient not taking: Reported on 07/21/2022) 25 tablet prn   No current facility-administered medications for this visit.      Marland Kitchen  PHYSICAL EXAMINATION: ECOG PERFORMANCE STATUS: 0 - Asymptomatic  Vitals:   07/21/22 1115  BP: (!) 153/71  Pulse: (!) 50  Resp: 16  Temp: (!) 96.8 F (36 C)  SpO2: 99%   Filed Weights   07/21/22 1115  Weight: 205 lb 3.2 oz (93.1 kg)   Left neck sided soft  swelling noted.   Physical Exam HENT:     Head: Normocephalic and atraumatic.     Mouth/Throat:     Pharynx: No oropharyngeal exudate.  Eyes:     Pupils: Pupils are equal, round, and reactive to light.  Neck:     Comments: Incision from the left neck dissection; parotidectomy noted.  Well-healing.  No evidence of any infection. Cardiovascular:     Rate and Rhythm: Normal rate and regular rhythm.  Pulmonary:     Effort: No respiratory distress.     Breath sounds: No wheezing.   Abdominal:     General: Bowel sounds are normal. There is no distension.     Palpations: Abdomen is soft. There is no mass.     Tenderness: There is no abdominal tenderness. There is no guarding or rebound.  Musculoskeletal:        General: No tenderness. Normal range of motion.     Cervical back: Normal range of motion and neck supple.  Skin:    General: Skin is warm.  Neurological:     Mental Status: He is alert and oriented to person, place, and time.     Comments: Left facial droop noted-post surgery.  Psychiatric:        Mood and Affect: Affect normal.      LABORATORY DATA:  I have reviewed the data as listed Lab Results  Component Value Date   WBC 7.2 07/21/2022   HGB 13.1 07/21/2022   HCT 39.9 07/21/2022   MCV 91.5 07/21/2022   PLT 280 07/21/2022   Recent Labs    01/24/22 1014 05/02/22 0811 05/22/22 0947 07/21/22 1040  NA 138 139 135 135  K 3.8 4.0 3.8 4.0  CL 106 105 100 100  CO2 '24 27 26 26  '$ GLUCOSE 126* 125* 131* 110*  BUN '18 21 22 '$ 27*  CREATININE 1.13 1.17 1.08 1.13  CALCIUM 9.3 9.4 8.6* 9.1  GFRNONAA >60  --  >60 >60  PROT 7.3 6.6 6.8 7.5  ALBUMIN 3.8 4.1 3.6 3.9  AST '27 20 26 27  '$ ALT '18 12 16 17  '$ ALKPHOS 57 70 68 67  BILITOT 0.3 0.4 0.3 0.4    RADIOGRAPHIC STUDIES: I have personally reviewed the radiological images as listed and agreed with the findings in the report. No results found.  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 Stage: pT4aN3b-stage IVb.  HER-2 positive; androgen receptor positive.] Patient currently s/p adjuvant radiation-[finished July 16th. 2022].   # PET scan: FEB 8th, 2024- Postsurgical changes of the left head and neck. Mild, questionably  increased uptake in level 1A nodes; recommend attention on follow-up. No other suspicious hypermetabolic foci to suggest disease recurrence or metastasis. would recommend imaging  in 3 months. Prefer [facility] imaging; if not will have done locally.  Recommend reaching out to Dr. Danton Sewer office regarding imaging.  # RUL lung nodule- 0.5 cm [PET FEB 2024; [facility]]; not present on PET in April, 2023- would recommend imaging in 3 months. Prefer [facility] imaging; if not will have done locally.   # Injection site pain -premedication with Emla cream.    # CKD- stage III-  [1 BID; tylenol]- stable  # Lymphedema neck s/p surgery-s/p  lymphedema treatment/prevention- STABLE.   *eligard q 6 M-last 07/21/2022  # DISPOSITION:  # Eligard today # follow up in 3 months- MD labs- cbc/cmp; Dr.B      All questions were answered. The patient knows to call the clinic with any problems, questions or concerns.    Cammie Sickle, MD 07/21/2022 12:53 PM

## 2022-07-21 NOTE — Progress Notes (Signed)
Pt in for follow up, reports had PET scan yesterday.

## 2022-08-07 ENCOUNTER — Other Ambulatory Visit: Payer: Self-pay | Admitting: Family Medicine

## 2022-08-17 ENCOUNTER — Ambulatory Visit: Payer: PPO

## 2022-08-25 ENCOUNTER — Telehealth: Payer: Self-pay | Admitting: *Deleted

## 2022-08-25 NOTE — Telephone Encounter (Signed)
Patient called reporting that he is to have a tooth extraction and his dentist wanted him to call us to see if there is any problem doing that given that he get Eligard injection from [place]. Please advise

## 2022-08-25 NOTE — Telephone Encounter (Signed)
Call returned to patient and informed of doctor response that it is Ok to proceed with tooth extraction

## 2022-09-13 ENCOUNTER — Other Ambulatory Visit: Payer: Self-pay | Admitting: Family Medicine

## 2022-09-18 DIAGNOSIS — H40153 Residual stage of open-angle glaucoma, bilateral: Secondary | ICD-10-CM | POA: Diagnosis not present

## 2022-09-27 ENCOUNTER — Ambulatory Visit: Payer: PPO | Attending: Internal Medicine

## 2022-09-27 DIAGNOSIS — I6523 Occlusion and stenosis of bilateral carotid arteries: Secondary | ICD-10-CM

## 2022-09-28 ENCOUNTER — Telehealth: Payer: Self-pay

## 2022-09-28 NOTE — Telephone Encounter (Signed)
Pt made aware of MD's recommendations and verbalized understanding.  

## 2022-09-28 NOTE — Telephone Encounter (Signed)
Nurse called pt to provide Carotid results. Pt verbalized understanding. However, pt reported since last night he's been experiencing dizziness when up moving around. He denies slurred speech, blurry vision, or numbness and tingling. Pt unable to provide blood pressure reading, but stated his friend who is a nurse said it was normal. He report HR 57  Will forward to MD for review.

## 2022-09-28 NOTE — Telephone Encounter (Signed)
HR is not out of the ordinary for the patient based on prior vitals.  I recommend that he monitor his BP at home and alert [place] if it is less than 100/60.  He should try drinking a little more water.  If dizziness worsens, he should go to the ED.  Yvonne Kendall, MD [facility]

## 2022-10-19 ENCOUNTER — Other Ambulatory Visit: Payer: PPO

## 2022-10-19 ENCOUNTER — Ambulatory Visit: Payer: PPO | Admitting: Internal Medicine

## 2022-10-23 ENCOUNTER — Encounter: Payer: Self-pay | Admitting: Internal Medicine

## 2022-10-23 ENCOUNTER — Inpatient Hospital Stay: Payer: PPO | Attending: Internal Medicine

## 2022-10-23 ENCOUNTER — Telehealth: Payer: Self-pay | Admitting: *Deleted

## 2022-10-23 ENCOUNTER — Inpatient Hospital Stay: Payer: PPO | Admitting: Internal Medicine

## 2022-10-23 VITALS — BP 149/67 | HR 56 | Temp 95.6°F | Ht 70.0 in | Wt 207.2 lb

## 2022-10-23 DIAGNOSIS — E1122 Type 2 diabetes mellitus with diabetic chronic kidney disease: Secondary | ICD-10-CM | POA: Diagnosis not present

## 2022-10-23 DIAGNOSIS — Z8052 Family history of malignant neoplasm of bladder: Secondary | ICD-10-CM | POA: Insufficient documentation

## 2022-10-23 DIAGNOSIS — Z79899 Other long term (current) drug therapy: Secondary | ICD-10-CM | POA: Insufficient documentation

## 2022-10-23 DIAGNOSIS — C07 Malignant neoplasm of parotid gland: Secondary | ICD-10-CM

## 2022-10-23 DIAGNOSIS — Z79818 Long term (current) use of other agents affecting estrogen receptors and estrogen levels: Secondary | ICD-10-CM | POA: Insufficient documentation

## 2022-10-23 DIAGNOSIS — Z87891 Personal history of nicotine dependence: Secondary | ICD-10-CM | POA: Diagnosis not present

## 2022-10-23 DIAGNOSIS — N183 Chronic kidney disease, stage 3 unspecified: Secondary | ICD-10-CM | POA: Insufficient documentation

## 2022-10-23 DIAGNOSIS — E1149 Type 2 diabetes mellitus with other diabetic neurological complication: Secondary | ICD-10-CM

## 2022-10-23 DIAGNOSIS — E785 Hyperlipidemia, unspecified: Secondary | ICD-10-CM

## 2022-10-23 DIAGNOSIS — Z7982 Long term (current) use of aspirin: Secondary | ICD-10-CM | POA: Insufficient documentation

## 2022-10-23 DIAGNOSIS — Z7984 Long term (current) use of oral hypoglycemic drugs: Secondary | ICD-10-CM | POA: Diagnosis not present

## 2022-10-23 DIAGNOSIS — I129 Hypertensive chronic kidney disease with stage 1 through stage 4 chronic kidney disease, or unspecified chronic kidney disease: Secondary | ICD-10-CM | POA: Diagnosis not present

## 2022-10-23 LAB — COMPREHENSIVE METABOLIC PANEL
ALT: 14 U/L (ref 0–44)
AST: 26 U/L (ref 15–41)
Albumin: 3.6 g/dL (ref 3.5–5.0)
Alkaline Phosphatase: 63 U/L (ref 38–126)
Anion gap: 9 (ref 5–15)
BUN: 25 mg/dL — ABNORMAL HIGH (ref 8–23)
CO2: 25 mmol/L (ref 22–32)
Calcium: 8.9 mg/dL (ref 8.9–10.3)
Chloride: 105 mmol/L (ref 98–111)
Creatinine, Ser: 1.57 mg/dL — ABNORMAL HIGH (ref 0.61–1.24)
GFR, Estimated: 44 mL/min — ABNORMAL LOW (ref >60)
Glucose, Bld: 109 mg/dL — ABNORMAL HIGH (ref 70–99)
Potassium: 4.2 mmol/L (ref 3.5–5.1)
Sodium: 139 mmol/L (ref 135–145)
Total Bilirubin: 0.3 mg/dL (ref 0.3–1.2)
Total Protein: 6.6 g/dL (ref 6.5–8.1)

## 2022-10-23 LAB — CBC WITH DIFFERENTIAL/PLATELET
Abs Immature Granulocytes: 0.02 10*3/uL (ref 0.00–0.07)
Basophils Absolute: 0 10*3/uL (ref 0.0–0.1)
Basophils Relative: 1 %
Eosinophils Absolute: 0.2 10*3/uL (ref 0.0–0.5)
Eosinophils Relative: 3 %
HCT: 38.3 % — ABNORMAL LOW (ref 39.0–52.0)
Hemoglobin: 12.1 g/dL — ABNORMAL LOW (ref 13.0–17.0)
Immature Granulocytes: 0 %
Lymphocytes Relative: 22 %
Lymphs Abs: 1.4 10*3/uL (ref 0.7–4.0)
MCH: 28.9 pg (ref 26.0–34.0)
MCHC: 31.6 g/dL (ref 30.0–36.0)
MCV: 91.6 fL (ref 80.0–100.0)
Monocytes Absolute: 0.6 10*3/uL (ref 0.1–1.0)
Monocytes Relative: 10 %
Neutro Abs: 4 10*3/uL (ref 1.7–7.7)
Neutrophils Relative %: 64 %
Platelets: 241 10*3/uL (ref 150–400)
RBC: 4.18 MIL/uL — ABNORMAL LOW (ref 4.22–5.81)
RDW: 14.4 % (ref 11.5–15.5)
WBC: 6.2 10*3/uL (ref 4.0–10.5)
nRBC: 0 % (ref 0.0–0.2)

## 2022-10-23 NOTE — Patient Instructions (Signed)
#   Please update [place] about the scan to be done at [facility]

## 2022-10-23 NOTE — Telephone Encounter (Signed)
-----   Message from Alvina Chou sent at 10/23/2022 12:52 PM EDT ----- Regarding: Lab orders for Tuesday, 5.28.24 Patient is scheduled for CPX labs, please order future labs, Thanks , Camelia Eng

## 2022-10-23 NOTE — Progress Notes (Signed)
[facility] NOTE  Patient Care Team: Jinny Sanders, MD as PCP - General (Family Medicine) End, Harrell Gave, MD as PCP - Cardiology (Cardiology) Hayden Pedro, MD as Consulting Physician (Ophthalmology) Lorelee Cover., MD as Referring Physician (Ophthalmology) Oneta Rack, MD as Referring Physician (Dermatology) Ladene Artist, MD as Consulting Physician (Gastroenterology) Rosalene Billings., MD as Referring Physician (Dentistry) Debbora Dus, [facility] as Pharmacist (Pharmacist) Cammie Sickle, MD as Consulting Physician (Hematology and Oncology) Jinny Sanders, MD as Consulting Physician (Family Medicine)  CHIEF COMPLAINTS/PURPOSE OF CONSULTATION: parotid cancer  Oncology History Overview Note  Addendum    Upon clinician request, additional immunostains are performed at [facility] on block F2, with the following results in the tumor cells:   HER2 (Ventana, clone [id number]) Interpretation: Positive IHC Score: 3+ (using the ASCO/CAP breast biomarker scoring guidelines; there are no consensus guideline for scoring HER2 in salivary duct carcinoma)   This slide has also been reviewed by Dr. Shelby Mattocks [facility] breast pathology) who concurs.  Addendum electronically signed by Tomasa Blase, MD on 09/03/2020 at  3:21 PM  Diagnosis    A. "Tissue around stylomastoid foramen"  - Fibrovascular tissue extensively involved by carcinoma   B. "Main trunk facial nerve"  - Nerve and fibrous tissue extensively involved by carcinoma     C. "Distal facial nerve" - Nerve tissue; negative for carcinoma     D. "Proximal mastoid nerve margin" - Nerve tissue; negative for carcinoma   E: Left upper superficial parotid, parotidectomy - Benign parotid parenchyma with focal acute and chronic inflammation - Negative for carcinoma   F: Left inferior parotid with sternocleidomastoid muscle, resection - High-grade salivary gland carcinoma, most consistent with salivary  duct carcinoma (see comment and synoptic report for further details) - Cauterized tumor present at inked deep surgical margin within fibrous tissue adjacent to skeletal muscle - Metastatic carcinoma in 1.1 cm nodal conglomerate and 4 of 4 intact lymph nodes (at least 5/5); positive for extranodal extension    G: Lymph node, left neck level 5, lymphadenectomy - Benign adipose; no lymph nodes or carcinoma identified   H: Lymph node, left neck level 1, lymphadenectomy - Metastatic carcinoma in 1 of 5 lymph nodes (1/5); maximum size of metastasis 2 mm; negative for extranodal extension.   I: Left omohyoid, excision - Benign skeletal muscle; negative for carcinoma   J: Left deep lobe parotid, excision - Benign parotid tissue; negative for carcinoma   K: Parotid tissue, biopsy - Benign parotid tissue - No metastatic carcinoma identified in 1 lymph nodes (0/1)   L: Left tissue over mastoid, biopsy - Benign parotid parenchyma, dense fibrous tissue, and bone; negative for carcinoma   M: "Stylomastoid foramen stitch on proximal nerve", excision - Large nerve, negative for perineural/intraneural carcinoma; stitched margin negative - Attached fibrous tissue and parotid parenchyma positive for carcinoma, focally invading around but not within large nerve segment, with crushed/cauterized tumor present at tissue edges   N: Tissue over styloid process, biopsy - Benign parotid tissue; negative for carcinoma   O: Posterior digastric margin, biopsy - Benign skeletal muscle; negative for carcinoma   P: Left neck dissections level 1 through 5, lymphadenectomy - Metastatic carcinoma in 11 of 26 lymph nodes (11/26); maximum size of metastasis 20 mm; two nodes (16 mm and 14 mm) positive for extranodal extension (ENEma, up to 4 mm beyond capsule)   Diagnosis: 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 -----------------------------------------------------------------   07/13/10: Right parathyroidectomy Pathology: Hypercellular parathyroid gland   -------------------------------------------------------------------------------------   # MARCH 2022- CT chest- [[facility]] Subcentimeter pulmonary nodules measuring up to 0.8 cm, indeterminate. No comparison available. Attention on follow-up is recommended.  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 Stage: pT4aN3b-stage IVb.  HER-2 positive; androgen receptor positive.]  PET scan-negative for distant metastatic disease.   # CAD [card]; DM- 2; CKD-III     Cancer of parotid gland (HCC)  09/11/2020 Initial Diagnosis   Cancer of parotid gland (HCC)   09/16/2020 Cancer Staging   Staging form: Major Salivary Glands, AJCC 8th Edition - Pathologic: Stage IVB (pT4a, pN3b, cM0) - Signed by Earna Coder, MD on 09/16/2020   HISTORY OF PRESENTING ILLNESS: Accompanied by his friend. Ambulating independently.  Miguel Bradley 82 y.o.  male pleasant patient with locally advanced high-grade salivary gland/parotid cancer HER2 positive;AR positive-T4AN3-stage IVb is currently s/p adjuvant radiation; on Eligard q6M is here for follow-up.   Denies any new lumps or bumps.  Denies any worsening swelling in the neck.   Denies any weight loss.  No hot flashes.  Denies any significant joint pains or bone pain.  Chronic mild fatigue.   Review of Systems  Constitutional:  Positive for malaise/fatigue.  Negative for chills, diaphoresis and fever.  HENT:  Negative for nosebleeds and sore throat.   Eyes:  Negative for double vision.  Respiratory:  Negative for cough, hemoptysis, sputum production, shortness of breath and wheezing.   Cardiovascular:  Negative for chest pain, palpitations, orthopnea and leg swelling.  Gastrointestinal:  Negative for abdominal pain, blood in stool, constipation, diarrhea, heartburn, melena, nausea and vomiting.  Genitourinary:  Negative for dysuria, frequency and urgency.  Musculoskeletal:  Positive for joint pain. Negative for back pain.  Skin: Negative.  Negative for itching and rash.  Neurological:  Negative for dizziness, tingling, focal weakness, weakness and headaches.  Endo/Heme/Allergies:  Does not bruise/bleed easily.  Psychiatric/Behavioral:  Negative for depression. The patient is not nervous/anxious and does not have insomnia.      MEDICAL HISTORY:  Past Medical History:  Diagnosis Date   Anemia    Aortic stenosis    Mild by most recent echo in 08/2021   Basal cell carcinoma of face    Cancer of parotid gland (HCC) 08/2020   s/p radical left parotidectomy and neck dissection and radiation   Cataract    Diabetes mellitus without complication (HCC)    Erectile dysfunction 06/11/2014   Glaucoma    Hypercalcemia    Hyperparathyroidism, unspecified (HCC)    Impotence of organic origin    Other testicular hypofunction    Pure hyperglyceridemia    Special screening for malignant neoplasm of prostate    Special screening for malignant neoplasms, colon    Unspecified essential hypertension     SURGICAL HISTORY: Past Surgical History:  Procedure Laterality Date   COLONOSCOPY  06/13/2018   Dr. Claudette Head   LEFT HEART CATH AND CORONARY ANGIOGRAPHY Left 03/16/2020   Procedure: LEFT HEART CATH AND CORONARY ANGIOGRAPHY;  Surgeon: Yvonne Kendall, MD;  Location[facility]  INVASIVE CV LAB;  Service: Cardiovascular;  Laterality: Left;    PARATHYROIDECTOMY  2012   PAROTIDECTOMY W/ NECK DISSECTION TOTAL Left 08/2020   [facility]   skin cancer removal     Dr. Jarold Motto   TRIGGER FINGER RELEASE Left 2008    SOCIAL HISTORY: Social History   Socioeconomic History   Marital status: Married    Spouse name: Not on file   Number of children: 1   Years of education: Not on file   Highest education level: Not on file  Occupational History   Occupation: Naval architect at [employer]  Tobacco Use   Smoking status: Former    Years: 25    Types: Cigarettes    Quit date: 06/12/1978    Years since quitting: 44.3   Smokeless tobacco: Never  Vaping Use   Vaping Use: Never used  Substance and Sexual Activity   Alcohol use: Not Currently    Comment: 1 beer every few weeks   Drug use: No   Sexual activity: Not Currently  Other Topics Concern   Not on file  Social History Narrative   No exercise, plays golf. Lives in [place]; self- [place]; daughter. 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 Rare alcohol. Quit smoking in 1980.    Social Determinants of Health   Financial Resource Strain: Low Risk  (04/26/2021)   Overall Financial Resource Strain (CARDIA)    Difficulty of Paying Living Expenses: Not very hard  Food Insecurity: Not on file  Transportation Needs: Not on file  Physical Activity: Not on file  Stress: Not on file  Social Connections: Not on file  Intimate Partner Violence: Not on file    FAMILY HISTORY: Family History  Problem Relation Age of Onset   Hypertension Mother    Dementia Mother    Diabetes Other        aunt   Hypertension Brother    Hypertension Brother    Hypertension Brother    Hyperlipidemia Brother    Hyperlipidemia Brother    Hyperlipidemia Brother    Hypertension Sister    Bladder Cancer Sister    Hyperlipidemia Sister    Colon cancer Neg Hx    Esophageal cancer Neg Hx    Rectal cancer Neg Hx    Stomach cancer Neg Hx     ALLERGIES:  has No Known Allergies.  MEDICATIONS:  Current Outpatient Medications   Medication Sig Dispense Refill   aspirin EC 81 MG tablet Take 81 mg by mouth daily.     dorzolamide-timolol (COSOPT) 22.3-6.8 MG/ML ophthalmic solution Place 1 drop into both eyes 2 (two) times daily.      fenofibrate 160 MG tablet TAKE 1 TABLET BY MOUTH EVERY DAY 90 tablet 3   hydrochlorothiazide (HYDRODIURIL) 25 MG tablet TAKE 1 TABLET BY MOUTH EVERY DAY 90 tablet 3   latanoprost (XALATAN) 0.005 % ophthalmic solution Place 1 drop into both eyes at bedtime.      metFORMIN (GLUCOPHAGE) 500 MG tablet TAKE ONE TABLET BY MOUTH TWICE DAILY WITH A MEAL 180 tablet 1   Multiple Vitamin (MULTIVITAMIN) tablet Take 1 tablet by mouth daily.     nitroGLYCERIN (NITROSTAT) 0.4 MG SL tablet Place 1 tablet (0.4 mg total) under the tongue every 5 (five) minutes as needed for chest pain. (Patient not taking: Reported on 07/21/2022) 25 tablet prn   Omega-3 Fatty Acids (FISH OIL) 1000 MG CAPS Take 1 capsule by mouth daily.     rosuvastatin (CRESTOR) 20 MG tablet TAKE 1 TABLET BY MOUTH EVERY  DAY 90 tablet 1   No current facility-administered medications for this visit.      Marland Kitchen  PHYSICAL EXAMINATION: ECOG PERFORMANCE STATUS: 0 - Asymptomatic  Vitals:   10/23/22 0956  BP: (!) 149/67  Pulse: (!) 56  Temp: (!) 95.6 F (35.3 C)  SpO2: 100%   Filed Weights   10/23/22 0956  Weight: 207 lb 3.2 oz (94 kg)   Left neck sided soft  swelling noted.   Physical Exam HENT:     Head: Normocephalic and atraumatic.     Mouth/Throat:     Pharynx: No oropharyngeal exudate.  Eyes:     Pupils: Pupils are equal, round, and reactive to light.  Neck:     Comments: Incision from the left neck dissection; parotidectomy noted.  Well-healing.  No evidence of any infection. Cardiovascular:     Rate and Rhythm: Normal rate and regular rhythm.  Pulmonary:     Effort: No respiratory distress.     Breath sounds: No wheezing.  Abdominal:     General: Bowel sounds are normal. There is no distension.     Palpations: Abdomen  is soft. There is no mass.     Tenderness: There is no abdominal tenderness. There is no guarding or rebound.  Musculoskeletal:        General: No tenderness. Normal range of motion.     Cervical back: Normal range of motion and neck supple.  Skin:    General: Skin is warm.  Neurological:     Mental Status: He is alert and oriented to person, place, and time.     Comments: Left facial droop noted-post surgery.  Psychiatric:        Mood and Affect: Affect normal.      LABORATORY DATA:  I have reviewed the data as listed Lab Results  Component Value Date   WBC 6.2 10/23/2022   HGB 12.1 (L) 10/23/2022   HCT 38.3 (L) 10/23/2022   MCV 91.6 10/23/2022   PLT 241 10/23/2022   Recent Labs    05/22/22 0947 07/21/22 1040 10/23/22 1001  NA 135 135 139  K 3.8 4.0 4.2  CL 100 100 105  CO2 26 26 25   GLUCOSE 131* 110* 109*  BUN 22 27* 25*  CREATININE 1.08 1.13 1.57*  CALCIUM 8.6* 9.1 8.9  GFRNONAA >60 >60 44*  PROT 6.8 7.5 6.6  ALBUMIN 3.6 3.9 3.6  AST 26 27 26   ALT 16 17 14   ALKPHOS 68 67 63  BILITOT 0.3 0.4 0.3    RADIOGRAPHIC STUDIES: I have personally reviewed the radiological images as listed and agreed with the findings in the report. VAS US CAROTID  Result Date: 09/28/2022 Carotid Arterial Duplex Study Patient Name:  INIOLUWA HARTNESS [facility]  Date of Exam:   09/27/2022 Medical Rec #: [id number]       Accession #:    [id number] Date of Birth: 07-Mar-1941        Patient Gender: M Patient Age:   82 years Exam Location:   Procedure:      VAS US CAROTID Referring Phys: Cristal Deer END --------------------------------------------------------------------------------  Indications:       One year. Risk Factors:      Hypertension, hyperlipidemia, Diabetes, past history of                    smoking, coronary artery disease. Comparison Study:  08/16/21 carotid duplex. RICA velocities of 95/23 cm/s and  known LICA occlusion Performing Technologist: Quentin Ore RDMS, RVT,  RDCS  Examination Guidelines: A complete evaluation includes B-mode imaging, spectral Doppler, color Doppler, and power Doppler as needed of all accessible portions of each vessel. Bilateral testing is considered an integral part of a complete examination. Limited examinations for reoccurring indications may be performed as noted.  Right Carotid Findings: +----------+--------+--------+--------+------------------+--------+           PSV cm/sEDV cm/sStenosisPlaque DescriptionComments +----------+--------+--------+--------+------------------+--------+ CCA Prox  104     16                                         +----------+--------+--------+--------+------------------+--------+ CCA Distal80      15                                         +----------+--------+--------+--------+------------------+--------+ ICA Prox  84      12      1-39%                              +----------+--------+--------+--------+------------------+--------+ ICA Mid   83      14                                         +----------+--------+--------+--------+------------------+--------+ ICA Distal68      21                                         +----------+--------+--------+--------+------------------+--------+ ECA       83      7                                          +----------+--------+--------+--------+------------------+--------+ +----------+--------+-------+----------------+-------------------+           PSV cm/sEDV cmsDescribe        Arm Pressure (mmHG) +----------+--------+-------+----------------+-------------------+ [id number]             Multiphasic, [id number]                 +----------+--------+-------+----------------+-------------------+ +---------+--------+--+--------+--+---------+ VertebralPSV cm/s76EDV cm/s13Antegrade +---------+--------+--+--------+--+---------+  Left Carotid Findings: +----------+--------+--------+--------+------------------+--------+            PSV cm/sEDV cm/sStenosisPlaque DescriptionComments +----------+--------+--------+--------+------------------+--------+ CCA Prox  134                                                +----------+--------+--------+--------+------------------+--------+ CCA Distal40                      heterogenous               +----------+--------+--------+--------+------------------+--------+ ICA Prox                  Occluded                           +----------+--------+--------+--------+------------------+--------+  ICA Mid                   Occluded                           +----------+--------+--------+--------+------------------+--------+ ICA Distal                Occluded                           +----------+--------+--------+--------+------------------+--------+ ECA       58      8                                          +----------+--------+--------+--------+------------------+--------+ +----------+--------+--------+----------------+-------------------+           PSV cm/sEDV cm/sDescribe        Arm Pressure (mmHG) +----------+--------+--------+----------------+-------------------+ [id number]             Multiphasic, FAO130                 +----------+--------+--------+----------------+-------------------+ +---------+--------+--+--------+---------+ VertebralPSV cm/s59EDV cm/sAntegrade +---------+--------+--+--------+---------+   Summary: Right Carotid: Velocities in the right ICA are consistent with a 1-39% stenosis.                Non-hemodynamically significant plaque <50% noted in the CCA. The                ECA appears <50% stenosed. Left Carotid: Evidence consistent with a total occlusion of the left ICA.               Non-hemodynamically significant plaque <50% noted in the CCA. The               ECA appears <50% stenosed. Vertebrals:  Bilateral vertebral arteries demonstrate antegrade flow. Subclavians: Normal flow hemodynamics were seen in  bilateral subclavian              arteries. *See table(s) above for measurements and observations. Suggest follow up study in 12 months. Electronically signed by Dina Rich MD on 09/28/2022 at 10:14:34 AM.    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 Stage: pT4aN3b-stage IVb.  HER-2 positive; androgen receptor positive.] Patient currently s/p adjuvant radiation-[finished July 16th. 2022].   # PET scan: FEB 8th, 2024- Postsurgical changes of the left head and neck. Mild, questionably increased uptake in level 1A nodes; recommend attention on follow-up. No other suspicious hypermetabolic foci to suggest disease recurrence or metastasis. would recommend imaging in 3 months. Prefer [facility] imaging; if not will have done locally.  Awaiting appt with  Dr. Timoteo Ace office on 5/15 for PET. Clinically stable; plan eligard at next visit.   # RUL lung nodule- 0.5 cm [PET FEB 2024; [facility]]; not present on PET in April, 2023- awaiting on PET scan from 5/15.    # Injection site pain -premedication with Emla cream. stable   # CKD- stage III- GFR 44[1 BID; tylenol]- stable; Discussed re: hydration,   # Lymphedema neck s/p surgery-s/p  lymphedema treatment/prevention- stable  *eligard q 6 M-last 07/21/2022  # DISPOSITION:  # follow  up in 3 months- MD labs- cbc/cmp; Eligard- Dr.B      All questions were answered. The patient knows to call the clinic with any problems, questions or concerns.    Earna Coder, MD 10/23/2022 10:47 AM

## 2022-10-23 NOTE — Progress Notes (Signed)
No concerns today 

## 2022-10-23 NOTE — Assessment & Plan Note (Addendum)
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 Stage: pT4aN3b-stage IVb.  HER-2 positive; androgen receptor positive.] Patient currently s/p adjuvant radiation-[finished July 16th. 2022].   # PET scan: FEB 8th, 2024- Postsurgical changes of the left head and neck. Mild, questionably increased uptake in level 1A nodes; recommend attention on follow-up. No other suspicious hypermetabolic foci to suggest disease recurrence or metastasis. would recommend imaging in 3 months. Prefer [facility] imaging; if not will have done locally.  Awaiting appt with  Dr. Timoteo Ace office on 5/15 for PET. Clinically stable; plan eligard at next visit.   # RUL lung nodule- 0.5 cm [PET FEB 2024; [facility]]; not present on PET in April, 2023- awaiting on PET scan from 5/15.    # Injection site pain -premedication with Emla cream. stable   # CKD- stage III- GFR 44[1 BID; tylenol]- stable; Discussed re: hydration,   # Lymphedema neck s/p surgery-s/p  lymphedema treatment/prevention- stable  *eligard q 6 M-last 07/21/2022  # DISPOSITION:  # follow up in 3 months- MD labs- cbc/cmp; Eligard- Dr.B

## 2022-10-25 DIAGNOSIS — Z9889 Other specified postprocedural states: Secondary | ICD-10-CM | POA: Diagnosis not present

## 2022-10-25 DIAGNOSIS — Z87891 Personal history of nicotine dependence: Secondary | ICD-10-CM | POA: Diagnosis not present

## 2022-10-25 DIAGNOSIS — C089 Malignant neoplasm of major salivary gland, unspecified: Secondary | ICD-10-CM | POA: Diagnosis not present

## 2022-10-25 DIAGNOSIS — Z923 Personal history of irradiation: Secondary | ICD-10-CM | POA: Diagnosis not present

## 2022-10-25 DIAGNOSIS — Z8589 Personal history of malignant neoplasm of other organs and systems: Secondary | ICD-10-CM | POA: Diagnosis not present

## 2022-10-25 DIAGNOSIS — Z08 Encounter for follow-up examination after completed treatment for malignant neoplasm: Secondary | ICD-10-CM | POA: Diagnosis not present

## 2022-10-25 DIAGNOSIS — I6522 Occlusion and stenosis of left carotid artery: Secondary | ICD-10-CM | POA: Diagnosis not present

## 2022-11-02 DIAGNOSIS — C07 Malignant neoplasm of parotid gland: Secondary | ICD-10-CM | POA: Diagnosis not present

## 2022-11-02 DIAGNOSIS — G510 Bell's palsy: Secondary | ICD-10-CM | POA: Diagnosis not present

## 2022-11-02 DIAGNOSIS — K117 Disturbances of salivary secretion: Secondary | ICD-10-CM | POA: Diagnosis not present

## 2022-11-07 ENCOUNTER — Other Ambulatory Visit (INDEPENDENT_AMBULATORY_CARE_PROVIDER_SITE_OTHER): Payer: PPO

## 2022-11-07 DIAGNOSIS — E785 Hyperlipidemia, unspecified: Secondary | ICD-10-CM | POA: Diagnosis not present

## 2022-11-07 DIAGNOSIS — E1149 Type 2 diabetes mellitus with other diabetic neurological complication: Secondary | ICD-10-CM | POA: Diagnosis not present

## 2022-11-07 DIAGNOSIS — E1169 Type 2 diabetes mellitus with other specified complication: Secondary | ICD-10-CM | POA: Diagnosis not present

## 2022-11-07 LAB — LIPID PANEL
Cholesterol: 120 mg/dL (ref 0–200)
HDL: 40.7 mg/dL (ref >39.00)
LDL Cholesterol: 45 mg/dL (ref 0–99)
NonHDL: 79.51
Total CHOL/HDL Ratio: 3
Triglycerides: 171 mg/dL — ABNORMAL HIGH (ref 0.0–149.0)
VLDL: 34.2 mg/dL (ref 0.0–40.0)

## 2022-11-07 LAB — HEMOGLOBIN A1C: Hgb A1c MFr Bld: 6.5 % (ref 4.6–6.5)

## 2022-11-07 NOTE — Progress Notes (Signed)
No critical labs need to be addressed urgently. We will discuss labs in detail at upcoming office visit.   

## 2022-11-08 ENCOUNTER — Other Ambulatory Visit: Payer: Self-pay | Admitting: Internal Medicine

## 2022-11-08 DIAGNOSIS — I6523 Occlusion and stenosis of bilateral carotid arteries: Secondary | ICD-10-CM

## 2022-11-14 ENCOUNTER — Encounter: Payer: Self-pay | Admitting: Family Medicine

## 2022-11-14 ENCOUNTER — Ambulatory Visit (INDEPENDENT_AMBULATORY_CARE_PROVIDER_SITE_OTHER): Payer: PPO | Admitting: Family Medicine

## 2022-11-14 VITALS — BP 130/64 | HR 65 | Temp 98.6°F | Ht 70.5 in | Wt 200.0 lb

## 2022-11-14 DIAGNOSIS — E1149 Type 2 diabetes mellitus with other diabetic neurological complication: Secondary | ICD-10-CM | POA: Diagnosis not present

## 2022-11-14 DIAGNOSIS — I6523 Occlusion and stenosis of bilateral carotid arteries: Secondary | ICD-10-CM | POA: Diagnosis not present

## 2022-11-14 DIAGNOSIS — E1169 Type 2 diabetes mellitus with other specified complication: Secondary | ICD-10-CM

## 2022-11-14 DIAGNOSIS — E1142 Type 2 diabetes mellitus with diabetic polyneuropathy: Secondary | ICD-10-CM | POA: Diagnosis not present

## 2022-11-14 DIAGNOSIS — R413 Other amnesia: Secondary | ICD-10-CM | POA: Diagnosis not present

## 2022-11-14 DIAGNOSIS — R911 Solitary pulmonary nodule: Secondary | ICD-10-CM | POA: Diagnosis not present

## 2022-11-14 DIAGNOSIS — E785 Hyperlipidemia, unspecified: Secondary | ICD-10-CM | POA: Diagnosis not present

## 2022-11-14 DIAGNOSIS — I10 Essential (primary) hypertension: Secondary | ICD-10-CM | POA: Diagnosis not present

## 2022-11-14 DIAGNOSIS — Z0000 Encounter for general adult medical examination without abnormal findings: Secondary | ICD-10-CM

## 2022-11-14 DIAGNOSIS — C07 Malignant neoplasm of parotid gland: Secondary | ICD-10-CM

## 2022-11-14 LAB — TSH: TSH: 0.83 u[IU]/mL (ref 0.35–5.50)

## 2022-11-14 LAB — VITAMIN D 25 HYDROXY (VIT D DEFICIENCY, FRACTURES): VITD: 38.35 ng/mL (ref 30.00–100.00)

## 2022-11-14 LAB — VITAMIN B12: Vitamin B-12: 148 pg/mL — ABNORMAL LOW (ref 211–911)

## 2022-11-14 NOTE — Assessment & Plan Note (Signed)
Followed by Oncology Dr. Racheal Patches. Reviewed last OV note 10/23/22

## 2022-11-14 NOTE — Assessment & Plan Note (Signed)
Chronic.  Stable dopplers 2024 chronic total occlusion of the left internal carotid artery and mild disease involving the right internal carotid artery.   Followed yearly by cardiology.

## 2022-11-14 NOTE — Patient Instructions (Addendum)
Look into changing Metformin to Jardiance.Marland Kitchen given heart disease risk reduction as well as diabetes control.  Please stop at the lab to have labs drawn  for memory loss.

## 2022-11-14 NOTE — Progress Notes (Signed)
Patient ID: Miguel Bradley, male    DOB: 04/16/1941, 82 y.o.   MRN: [id number]  This visit was conducted in person.  BP 130/64   Pulse 65   Temp 98.6 F (37 C) (Temporal)   Ht 5' 10.5" (1.791 m)   Wt 200 lb (90.7 kg)   SpO2 95%   BMI 28.29 kg/m    CC: Chief Complaint  Patient presents with   Medicare Wellness    Subjective:   HPI: Miguel Bradley is a 82 y.o. male presenting on 11/14/2022 for Medicare Wellness  The patient presents for annual medicare wellness, complete physical and review of chronic health problems. He/She also has the following acute concerns today: none  I have personally reviewed the Medicare Annual Wellness questionnaire and have noted 1. The patient's medical and social history 2. Their use of alcohol, tobacco or illicit drugs 3. Their current medications and supplements 4. The patient's functional ability including ADL's, fall risks, home safety risks and hearing or visual             impairment. 5. Diet and physical activities 6. Evidence for depression or mood disorders 7.         Updated provider list Cognitive evaluation was performed and recorded on pt medicare questionnaire form. The patients weight, height, BMI and visual acuity have been recorded in the chart   I have made referrals, counseling and provided education to the patient based review of the above and I have provided the pt with a written personalized care plan for preventive services.   Documentation of this information was scanned into the electronic record under the media tab.  No falls in last 12 months.  Cardiac CTA showed multivessel CAD, including FFR positive LAD disease and chronic total occlusion of the mid RCA.  He underwent cardiac catheterization in 03/2020 that showed 80 to 90% distal RCA stenosis and nonobstructive disease in the left coronary artery.  Given that he was totally asymptomatic, they opted to continue with medical therapy.  Followed by cardiology.    Cancer of parotid, S/P resection, current on eliguard  Reviewed last OV Oncology Dr. Racheal Patches 10/2022  PET scan: FEB 8th, 2024- Postsurgical changes of the left head and neck. Mild, questionably increased uptake in level 1A nodes; recommend attention on follow-up. No other suspicious hypermetabolic foci to suggest disease recurrence or metastasis. would recommend imaging in 3 months.   5./15 CT neck.. stable per Roma Schanz.  Next Eligard in 01/2023   RUL lung nodule- 0.5 cm [PET FEB 2024; [facility]]; not present on PET in April, 2023- awaiting on PET scan from 5/15.    Elevated Cholesterol:  LDL at goal < 70 on crestor and fenofibrate Lab Results  Component Value Date   CHOL 120 11/07/2022   HDL 40.70 11/07/2022   LDLCALC 45 11/07/2022   LDLDIRECT 70.0 12/31/2020   TRIG 171.0 (H) 11/07/2022   CHOLHDL 3 11/07/2022  Using medications without problems: Muscle aches:  Diet compliance: good Exercise: playing golf again. Other complaints:  Carotid stenosis: last doppler per cardiology  scheduled 6/23  Hypertension:    At goal on HCTZ 25 mg daily BP Readings from Last 3 Encounters:  11/14/22 130/64  10/23/22 (!) 149/67  07/21/22 (!) 153/71  Using medication without problems or lightheadedness:  none Chest pain with exertion: none Edema:none Short of breath: none Average home BPs: Other issues:  Diabetes:   Improved with weight loss, on metformin 500 mg BID Lab Results  Component Value Date   HGBA1C 6.5 11/07/2022  Using medications without difficulties: Hypoglycemic episodes: Hyperglycemic episodes: Feet problems: none Blood Sugars averaging: not checking eye exam within last year: yes     Wt Readings from Last 3 Encounters:  11/14/22 200 lb (90.7 kg)  10/23/22 207 lb 3.2 oz (94 kg)  07/21/22 205 lb 3.2 oz (93.1 kg)   Drinking more water and less soft drinks.  GFR decreased on 10/23/2022 visits.    MMSE 30/30 in office today.  Notes some  memory issues when trying to  recall names  of things.  Relevant past medical, surgical, family and social history reviewed and updated as indicated. Interim medical history since our last visit reviewed. Allergies and medications reviewed and updated. Outpatient Medications Prior to Visit  Medication Sig Dispense Refill   aspirin EC 81 MG tablet Take 81 mg by mouth daily.     dorzolamide-timolol (COSOPT) 22.3-6.8 MG/ML ophthalmic solution Place 1 drop into both eyes 2 (two) times daily.      fenofibrate 160 MG tablet TAKE 1 TABLET BY MOUTH EVERY DAY 90 tablet 3   hydrochlorothiazide (HYDRODIURIL) 25 MG tablet TAKE 1 TABLET BY MOUTH EVERY DAY 90 tablet 3   latanoprost (XALATAN) 0.005 % ophthalmic solution Place 1 drop into both eyes at bedtime.      metFORMIN (GLUCOPHAGE) 500 MG tablet TAKE ONE TABLET BY MOUTH TWICE DAILY WITH A MEAL 180 tablet 1   Multiple Vitamin (MULTIVITAMIN) tablet Take 1 tablet by mouth daily.     nitroGLYCERIN (NITROSTAT) 0.4 MG SL tablet Place 1 tablet (0.4 mg total) under the tongue every 5 (five) minutes as needed for chest pain. 25 tablet prn   Omega-3 Fatty Acids (FISH OIL) 1000 MG CAPS Take 1 capsule by mouth daily.     rosuvastatin (CRESTOR) 20 MG tablet TAKE 1 TABLET BY MOUTH EVERY DAY 90 tablet 1   No facility-administered medications prior to visit.     Per HPI unless specifically indicated in ROS section below Review of Systems  Constitutional:  Negative for fatigue and fever.  HENT:  Negative for ear pain.   Eyes:  Negative for pain.  Respiratory:  Negative for cough and shortness of breath.   Cardiovascular:  Negative for chest pain, palpitations and leg swelling.  Gastrointestinal:  Negative for abdominal pain.  Genitourinary:  Negative for dysuria.  Musculoskeletal:  Negative for arthralgias.  Neurological:  Negative for syncope, light-headedness and headaches.  Psychiatric/Behavioral:  Negative for dysphoric mood.    Objective:  BP 130/64   Pulse 65   Temp 98.6 F (37  C) (Temporal)   Ht 5' 10.5" (1.791 m)   Wt 200 lb (90.7 kg)   SpO2 95%   BMI 28.29 kg/m   Wt Readings from Last 3 Encounters:  11/14/22 200 lb (90.7 kg)  10/23/22 207 lb 3.2 oz (94 kg)  07/21/22 205 lb 3.2 oz (93.1 kg)      Physical Exam Constitutional:      General: He is not in acute distress.    Appearance: Normal appearance. He is well-developed. He is not ill-appearing or toxic-appearing.  HENT:     Head: Normocephalic and atraumatic.     Comments: Post surgical changes to left side of face.. cannot fully close left eye, lip droop and some trouble breathing though ;eft nostril    Right Ear: Hearing, tympanic membrane, ear canal and external ear normal.     Left Ear: Hearing, tympanic membrane, ear canal and external  ear normal.     Nose: Nose normal.     Mouth/Throat:     Pharynx: Uvula midline.  Eyes:     General: Lids are normal. Lids are everted, no foreign bodies appreciated.     Conjunctiva/sclera: Conjunctivae normal.     Pupils: Pupils are equal, round, and reactive to light.  Neck:     Thyroid: No thyroid mass or thyromegaly.     Vascular: No carotid bruit.     Trachea: Trachea and phonation normal.     Comments: Abnormal musculature of left neck from surgical changes. Cardiovascular:     Rate and Rhythm: Normal rate and regular rhythm.     Pulses: Normal pulses.     Heart sounds: S1 normal and S2 normal. Heart sounds not distant. No murmur heard.    No friction rub. No gallop.     Comments: No peripheral edema Pulmonary:     Effort: Pulmonary effort is normal. No respiratory distress.     Breath sounds: Normal breath sounds. No wheezing, rhonchi or rales.  Abdominal:     General: Bowel sounds are normal.     Palpations: Abdomen is soft.     Tenderness: There is no abdominal tenderness. There is no guarding or rebound.     Hernia: No hernia is present.  Musculoskeletal:     Cervical back: Normal range of motion and neck supple.  Lymphadenopathy:      Cervical: No cervical adenopathy.  Skin:    General: Skin is warm and dry.     Findings: No rash.  Neurological:     Mental Status: He is alert.     Cranial Nerves: No cranial nerve deficit.     Sensory: No sensory deficit.     Gait: Gait normal.     Deep Tendon Reflexes: Reflexes are normal and symmetric.  Psychiatric:        Speech: Speech normal.        Behavior: Behavior normal.        Thought Content: Thought content normal.        Judgment: Judgment normal.       Results for orders placed or performed in visit on 11/07/22  Lipid panel  Result Value Ref Range   Cholesterol 120 0 - 200 mg/dL   Triglycerides 161.0 (H) 0.0 - 149.0 mg/dL   HDL 96.04 >54.09 mg/dL   VLDL 81.1 0.0 - 91.4 mg/dL   LDL Cholesterol 45 0 - 99 mg/dL   Total CHOL/HDL Ratio 3    NonHDL 79.51   Hemoglobin A1c  Result Value Ref Range   Hgb A1c MFr Bld 6.5 4.6 - 6.5 %     COVID 19 screen:  No recent travel or known exposure to COVID19 The patient denies respiratory symptoms of COVID 19 at this time. The importance of social distancing was discussed today.   Assessment and Plan The patient's preventative maintenance and recommended screening tests for an annual wellness exam were reviewed in full today. Brought up to date unless services declined.  Counselled on the importance of diet, exercise, and its role in overall health and mortality. The patient's FH and SH was reviewed, including their home life, tobacco status, and drug and alcohol status.    Vaccines:  COVID, PNA, flu and Td uptodate, refuses shingles vaccine...says he has never had chicken pox   Former smoker.Quit 34 years ago.   Colon: 06/2018 Dr. Russella Dar, tubular adenoma, repeat 5 years if healthy. Prostate: not indicated after age  71.    Has had yearly eye exam.  Problem List Items Addressed This Visit     Bilateral carotid artery stenosis    Chronic.  Stable dopplers 2024 chronic total occlusion of the left internal carotid artery  and mild disease involving the right internal carotid artery.   Followed yearly by cardiology.      Cancer of parotid gland (HCC) (Chronic)     Followed by Oncology Dr. Racheal Patches. Reviewed last OV note 10/23/22      Diabetic neuropathy (HCC) (Chronic)    Chronic, stable control, minimally symptomatic  Secondary to diabetes      Essential hypertension (Chronic)    Stable, chronic.  Continue current medication.  HCTZ 25 mg daily      Hyperlipidemia associated with type 2 diabetes mellitus (HCC) (Chronic)    Stable, chronic.  Continue current medication.  LDL at goal less than 70  Crestor 20 mg daily  fenofibrate 160 mg daily      Pulmonary nodule     0.5 cm [PET FEB 2024; [facility]]; not present on PET in April, 2023-   Had head neck CT , not PET in 5/15... may need follow up lung CT.      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  Continue current medication.  Discussed possibly change metformin to Jardiance given cardiovascular benefit.  He will discuss with his daughter and let me know.  Metformin 500 mg p.o. twice daily      Other Visit Diagnoses     Medicare annual wellness visit, subsequent    -  Primary   Memory changes       Relevant Orders   VITAMIN D 25 Hydroxy (Vit-D Deficiency, Fractures)   Vitamin B12   TSH      Kerby Nora, MD

## 2022-11-14 NOTE — Assessment & Plan Note (Signed)
Stable, chronic.  Continue current medication.  HCTZ 25 mg daily 

## 2022-11-14 NOTE — Assessment & Plan Note (Addendum)
Stable, chronic.  Continue current medication.  Discussed possibly change metformin to Jardiance given cardiovascular benefit.  He will discuss with his daughter and let me know.  Metformin 500 mg p.o. twice daily

## 2022-11-14 NOTE — Assessment & Plan Note (Addendum)
0.5 cm [PET FEB 2024; [facility]]; not present on PET in April, 2023-   Had head neck CT , not PET in 5/15... may need follow up lung CT.

## 2022-11-14 NOTE — Assessment & Plan Note (Signed)
Stable, chronic.  Continue current medication.  LDL at goal less than 70  Crestor 20 mg daily  fenofibrate 160 mg daily 

## 2022-11-14 NOTE — Assessment & Plan Note (Signed)
Chronic, stable control, minimally symptomatic  Secondary to diabetes 

## 2022-11-24 DIAGNOSIS — D0439 Carcinoma in situ of skin of other parts of face: Secondary | ICD-10-CM | POA: Diagnosis not present

## 2022-12-04 ENCOUNTER — Encounter (INDEPENDENT_AMBULATORY_CARE_PROVIDER_SITE_OTHER): Payer: PPO | Admitting: Ophthalmology

## 2022-12-04 DIAGNOSIS — H43813 Vitreous degeneration, bilateral: Secondary | ICD-10-CM

## 2022-12-04 DIAGNOSIS — H35372 Puckering of macula, left eye: Secondary | ICD-10-CM | POA: Diagnosis not present

## 2022-12-04 DIAGNOSIS — H35033 Hypertensive retinopathy, bilateral: Secondary | ICD-10-CM

## 2022-12-04 DIAGNOSIS — I10 Essential (primary) hypertension: Secondary | ICD-10-CM | POA: Diagnosis not present

## 2022-12-22 ENCOUNTER — Other Ambulatory Visit: Payer: Self-pay | Admitting: Family Medicine

## 2023-01-03 DIAGNOSIS — C07 Malignant neoplasm of parotid gland: Secondary | ICD-10-CM | POA: Diagnosis not present

## 2023-01-03 DIAGNOSIS — Z9889 Other specified postprocedural states: Secondary | ICD-10-CM | POA: Diagnosis not present

## 2023-01-03 DIAGNOSIS — Z79818 Long term (current) use of other agents affecting estrogen receptors and estrogen levels: Secondary | ICD-10-CM | POA: Diagnosis not present

## 2023-01-03 DIAGNOSIS — I890 Lymphedema, not elsewhere classified: Secondary | ICD-10-CM | POA: Diagnosis not present

## 2023-01-03 DIAGNOSIS — Z923 Personal history of irradiation: Secondary | ICD-10-CM | POA: Diagnosis not present

## 2023-01-03 DIAGNOSIS — C7989 Secondary malignant neoplasm of other specified sites: Secondary | ICD-10-CM | POA: Diagnosis not present

## 2023-01-03 DIAGNOSIS — E890 Postprocedural hypothyroidism: Secondary | ICD-10-CM | POA: Diagnosis not present

## 2023-01-03 DIAGNOSIS — R911 Solitary pulmonary nodule: Secondary | ICD-10-CM | POA: Diagnosis not present

## 2023-01-03 DIAGNOSIS — M270 Developmental disorders of jaws: Secondary | ICD-10-CM | POA: Diagnosis not present

## 2023-01-03 DIAGNOSIS — Z87891 Personal history of nicotine dependence: Secondary | ICD-10-CM | POA: Diagnosis not present

## 2023-01-03 DIAGNOSIS — Z9089 Acquired absence of other organs: Secondary | ICD-10-CM | POA: Diagnosis not present

## 2023-01-03 DIAGNOSIS — Z79899 Other long term (current) drug therapy: Secondary | ICD-10-CM | POA: Diagnosis not present

## 2023-01-03 DIAGNOSIS — Z972 Presence of dental prosthetic device (complete) (partial): Secondary | ICD-10-CM | POA: Diagnosis not present

## 2023-01-08 DIAGNOSIS — H0012 Chalazion right lower eyelid: Secondary | ICD-10-CM | POA: Diagnosis not present

## 2023-01-22 NOTE — Progress Notes (Unsigned)
[facility] CONSULT NOTE  Patient Care Team: Excell Seltzer, MD as PCP - General (Family Medicine) End, Cristal Deer, MD as PCP - Cardiology (Cardiology) Sherrie George, MD as Consulting Physician (Ophthalmology) Irene Limbo., MD as Referring Physician (Ophthalmology) Debbrah Alar, MD as Referring Physician (Dermatology) Meryl Dare, MD as Consulting Physician (Gastroenterology) Wynona Meals., MD as Referring Physician (Dentistry) Vilinda Flake, [facility] (Inactive) as Pharmacist (Pharmacist) Earna Coder, MD as Consulting Physician (Hematology and Oncology) Excell Seltzer, MD as Consulting Physician (Family Medicine)  CHIEF COMPLAINTS/PURPOSE OF CONSULTATION: parotid cancer  Oncology History Overview Note  Addendum    Upon clinician request, additional immunostains are performed at [facility] on block F2, with the following results in the tumor cells:   HER2 ([place], clone [id number]) Interpretation: Positive IHC Score: 3+ (using the ASCO/CAP breast biomarker scoring guidelines; there are no consensus guideline for scoring HER2 in salivary duct carcinoma)   This slide has also been reviewed by Dr. Tobie Poet [facility] breast pathology) who concurs.  Addendum electronically signed by Alyce Pagan, MD on 09/03/2020 at  3:21 PM  Diagnosis    A. "Tissue around stylomastoid foramen"  - Fibrovascular tissue extensively involved by carcinoma   B. "Main trunk facial nerve"  - Nerve and fibrous tissue extensively involved by carcinoma     C. "Distal facial nerve" - Nerve tissue; negative for carcinoma     D. "Proximal mastoid nerve margin" - Nerve tissue; negative for carcinoma   E: Left upper superficial parotid, parotidectomy - Benign parotid parenchyma with focal acute and chronic inflammation - Negative for carcinoma   F: Left inferior parotid with sternocleidomastoid muscle, resection - High-grade salivary gland carcinoma, most consistent with  salivary duct carcinoma (see comment and synoptic report for further details) - Cauterized tumor present at inked deep surgical margin within fibrous tissue adjacent to skeletal muscle - Metastatic carcinoma in 1.1 cm nodal conglomerate and 4 of 4 intact lymph nodes (at least 5/5); positive for extranodal extension    G: Lymph node, left neck level 5, lymphadenectomy - Benign adipose; no lymph nodes or carcinoma identified   H: Lymph node, left neck level 1, lymphadenectomy - Metastatic carcinoma in 1 of 5 lymph nodes (1/5); maximum size of metastasis 2 mm; negative for extranodal extension.   I: Left omohyoid, excision - Benign skeletal muscle; negative for carcinoma   J: Left deep lobe parotid, excision - Benign parotid tissue; negative for carcinoma   K: Parotid tissue, biopsy - Benign parotid tissue - No metastatic carcinoma identified in 1 lymph nodes (0/1)   L: Left tissue over mastoid, biopsy - Benign parotid parenchyma, dense fibrous tissue, and bone; negative for carcinoma   M: "Stylomastoid foramen stitch on proximal nerve", excision - Large nerve, negative for perineural/intraneural carcinoma; stitched margin negative - Attached fibrous tissue and parotid parenchyma positive for carcinoma, focally invading around but not within large nerve segment, with crushed/cauterized tumor present at tissue edges   N: Tissue over styloid process, biopsy - Benign parotid tissue; negative for carcinoma   O: Posterior digastric margin, biopsy - Benign skeletal muscle; negative for carcinoma   P: Left neck dissections level 1 through 5, lymphadenectomy - Metastatic carcinoma in 11 of 26 lymph nodes (11/26); maximum size of metastasis 20 mm; two nodes (16 mm and 14 mm) positive for extranodal extension (ENEma, up to 4 mm beyond capsule)   Diagnosis: 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 -----------------------------------------------------------------   07/13/10: Right parathyroidectomy Pathology: Hypercellular parathyroid gland   -------------------------------------------------------------------------------------   # MARCH 2022- CT chest- [[facility]] Subcentimeter pulmonary nodules measuring up to 0.8 cm, indeterminate. No comparison available. Attention on follow-up is recommended.  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 Stage: pT4aN3b-stage IVb.  HER-2 positive; androgen receptor positive.]  PET scan-negative for distant metastatic disease.   # CAD [card]; DM- 2; CKD-III     Cancer of parotid gland (HCC)  09/11/2020 Initial Diagnosis   Cancer of parotid gland (HCC)   09/16/2020 Cancer Staging   Staging form: Major Salivary Glands, AJCC 8th Edition - Pathologic: Stage IVB (pT4a, pN3b, cM0) - Signed by Earna Coder, MD on 09/16/2020   HISTORY OF PRESENTING ILLNESS: Accompanied by his friend. Ambulating independently.  Miguel Bradley 82 y.o.  male pleasant patient with locally advanced high-grade salivary gland/parotid cancer HER2 positive;AR positive-T4AN3-stage IVb is currently s/p adjuvant radiation; on Eligard q6M is here for follow-up.   Denies any new lumps or bumps.  Denies any worsening swelling in the neck.   Denies any weight loss.  No hot flashes.  Denies any significant joint pains or bone pain.  Chronic mild fatigue.   Review of Systems  Constitutional:  Positive for  malaise/fatigue. Negative for chills, diaphoresis and fever.  HENT:  Negative for nosebleeds and sore throat.   Eyes:  Negative for double vision.  Respiratory:  Negative for cough, hemoptysis, sputum production, shortness of breath and wheezing.   Cardiovascular:  Negative for chest pain, palpitations, orthopnea and leg swelling.  Gastrointestinal:  Negative for abdominal pain, blood in stool, constipation, diarrhea, heartburn, melena, nausea and vomiting.  Genitourinary:  Negative for dysuria, frequency and urgency.  Musculoskeletal:  Positive for joint pain. Negative for back pain.  Skin: Negative.  Negative for itching and rash.  Neurological:  Negative for dizziness, tingling, focal weakness, weakness and headaches.  Endo/Heme/Allergies:  Does not bruise/bleed easily.  Psychiatric/Behavioral:  Negative for depression. The patient is not nervous/anxious and does not have insomnia.      MEDICAL HISTORY:  Past Medical History:  Diagnosis Date  . Anemia   . Aortic stenosis    Mild by most recent echo in 08/2021  . Basal cell carcinoma of face   . Cancer of parotid gland (HCC) 08/2020   s/p radical left parotidectomy and neck dissection and radiation  . Cataract   . Diabetes mellitus without complication (HCC)   . Erectile dysfunction 06/11/2014  . Glaucoma   . Hypercalcemia   . Hyperparathyroidism, unspecified (HCC)   . Impotence of organic origin   . Other testicular hypofunction   . Pure hyperglyceridemia   . Special screening for malignant neoplasm of prostate   . Special screening for malignant neoplasms, colon   . Unspecified essential hypertension     SURGICAL HISTORY: Past Surgical History:  Procedure Laterality Date  . COLONOSCOPY  06/13/2018   Dr. Claudette Head  . LEFT HEART CATH AND CORONARY ANGIOGRAPHY Left 03/16/2020   Procedure: LEFT HEART CATH AND CORONARY ANGIOGRAPHY;  Surgeon: Yvonne Kendall, MD;  Location: [facility] INVASIVE CV LAB;  Service: Cardiovascular;   Laterality: Left;  . PARATHYROIDECTOMY  2012  . PAROTIDECTOMY W/ NECK DISSECTION TOTAL Left 08/2020   [facility]  . skin cancer removal     Dr. Jarold Motto  . TRIGGER FINGER RELEASE Left 2008    SOCIAL HISTORY: Social History   Socioeconomic History  . Marital status: Married    Spouse name: Not on file  . Number of children: 1  . Years of education: Not on file  . Highest education level: Not on file  Occupational History  . Occupation: Naval architect at [employer]  . Smoking status: Former    Current packs/day: 0.00    Types: Cigarettes    Start date: 06/12/1953    Quit date: 06/12/1978    Years since quitting: 44.6  . Smokeless tobacco: Never  Vaping Use  . Vaping status: Never Used  Substance and Sexual Activity  . Alcohol use: Not Currently    Comment: 1 beer every few weeks  . Drug use: No  . Sexual activity: Not Currently  Other Topics Concern  . Not on file  Social History Narrative   No exercise, plays golf. Lives in [place]; self- [place]; daughter. puchasing dept in [place]. Rare alcohol. Quit smoking in 1980.    Social Determinants of Health   Financial Resource Strain: Low Risk  (04/26/2021)   Overall Financial Resource Strain (CARDIA)   . Difficulty of Paying Living Expenses: Not very hard  Food Insecurity: Not on file  Transportation Needs: Not on file  Physical Activity: Not on file  Stress: Not on file  Social Connections: Not on file  Intimate Partner Violence: Not on file    FAMILY HISTORY: Family History  Problem Relation Age of Onset  . Hypertension Mother   . Dementia Mother   . Diabetes Other        aunt  . Hypertension Brother   . Hypertension Brother   . Hypertension Brother   . Hyperlipidemia Brother   . Hyperlipidemia Brother   . Hyperlipidemia Brother   . Hypertension Sister   . Bladder Cancer Sister   . Hyperlipidemia Sister   . Colon cancer Neg Hx   . Esophageal cancer Neg Hx   . Rectal cancer Neg Hx   . Stomach cancer Neg  Hx     ALLERGIES:  has No Known Allergies.  MEDICATIONS:  Current Outpatient Medications  Medication Sig Dispense Refill  . aspirin EC 81 MG tablet Take 81 mg by mouth daily.    . dorzolamide-timolol (COSOPT) 22.3-6.8 MG/ML ophthalmic solution Place 1 drop into both eyes 2 (two) times daily.     . fenofibrate 160 MG tablet TAKE 1 TABLET BY MOUTH EVERY DAY 90 tablet 3  . hydrochlorothiazide (HYDRODIURIL) 25 MG tablet TAKE 1 TABLET BY MOUTH EVERY DAY 90 tablet 3  . latanoprost (XALATAN) 0.005 % ophthalmic solution Place 1 drop into both eyes at bedtime.     . metFORMIN (GLUCOPHAGE) 500 MG tablet TAKE ONE TABLET BY MOUTH TWICE DAILY WITH A MEAL 180 tablet 1  . Multiple Vitamin (MULTIVITAMIN) tablet Take 1 tablet by mouth daily.    . nitroGLYCERIN (NITROSTAT) 0.4 MG SL tablet Place 1 tablet (0.4 mg total) under the tongue every 5 (five) minutes as needed for chest pain. 25 tablet prn  . Omega-3 Fatty Acids (FISH OIL) 1000 MG CAPS Take 1 capsule by mouth daily.    . rosuvastatin (CRESTOR) 20 MG tablet TAKE 1 TABLET  BY MOUTH EVERY DAY 90 tablet 1   No current facility-administered medications for this visit.      Marland Kitchen  PHYSICAL EXAMINATION: ECOG PERFORMANCE STATUS: 0 - Asymptomatic  There were no vitals filed for this visit.  There were no vitals filed for this visit.  Left neck sided soft  swelling noted.   Physical Exam HENT:     Head: Normocephalic and atraumatic.     Mouth/Throat:     Pharynx: No oropharyngeal exudate.  Eyes:     Pupils: Pupils are equal, round, and reactive to light.  Neck:     Comments: Incision from the left neck dissection; parotidectomy noted.  Well-healing.  No evidence of any infection. Cardiovascular:     Rate and Rhythm: Normal rate and regular rhythm.  Pulmonary:     Effort: No respiratory distress.     Breath sounds: No wheezing.  Abdominal:     General: Bowel sounds are normal. There is no distension.     Palpations: Abdomen is soft. There is  no mass.     Tenderness: There is no abdominal tenderness. There is no guarding or rebound.  Musculoskeletal:        General: No tenderness. Normal range of motion.     Cervical back: Normal range of motion and neck supple.  Skin:    General: Skin is warm.  Neurological:     Mental Status: He is alert and oriented to person, place, and time.     Comments: Left facial droop noted-post surgery.  Psychiatric:        Mood and Affect: Affect normal.     LABORATORY DATA:  I have reviewed the data as listed Lab Results  Component Value Date   WBC 6.2 10/23/2022   HGB 12.1 (L) 10/23/2022   HCT 38.3 (L) 10/23/2022   MCV 91.6 10/23/2022   PLT 241 10/23/2022   Recent Labs    05/22/22 0947 07/21/22 1040 10/23/22 1001  NA 135 135 139  K 3.8 4.0 4.2  CL 100 100 105  CO2 26 26 25   GLUCOSE 131* 110* 109*  BUN 22 27* 25*  CREATININE 1.08 1.13 1.57*  CALCIUM 8.6* 9.1 8.9  GFRNONAA >60 >60 44*  PROT 6.8 7.5 6.6  ALBUMIN 3.6 3.9 3.6  AST 26 27 26   ALT 16 17 14   ALKPHOS 68 67 63  BILITOT 0.3 0.4 0.3    RADIOGRAPHIC STUDIES: I have personally reviewed the radiological images as listed and agreed with the findings in the report. No results found.  ASSESSMENT & PLAN:   No problem-specific Assessment & Plan notes found for this encounter.     All questions were answered. The patient knows to call the clinic with any problems, questions or concerns.    Earna Coder, MD 01/22/2023 7:21 PM

## 2023-01-22 NOTE — Assessment & Plan Note (Signed)
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 Stage: pT4aN3b-stage IVb.  HER-2 positive; androgen receptor positive.] Patient currently s/p adjuvant radiation-[finished July 16th. 2022].   # PET scan: FEB 8th, 2024- Postsurgical changes of the left head and neck. Mild, questionably increased uptake in level 1A nodes; recommend attention on follow-up. No other suspicious hypermetabolic foci to suggest disease recurrence or metastasis. would recommend imaging in 3 months. Prefer [facility] imaging; if not will have done locally.  Awaiting appt with  Dr. Timoteo Ace office on 5/15 for PET. Clinically stable; plan eligard at next visit.   # RUL lung nodule- 0.5 cm [PET FEB 2024; [facility]]; not present on PET in April, 2023- awaiting on PET scan from 5/15.    # Injection site pain -premedication with Emla cream. stable   # CKD- stage III- GFR 44[1 BID; tylenol]- stable; Discussed re: hydration,   # Lymphedema neck s/p surgery-s/p  lymphedema treatment/prevention- stable  *eligard q 6 M-last 07/21/2022  # DISPOSITION:  # follow up in 3 months- MD labs- cbc/cmp; Eligard- Dr.B

## 2023-01-23 ENCOUNTER — Encounter: Payer: Self-pay | Admitting: Internal Medicine

## 2023-01-23 ENCOUNTER — Inpatient Hospital Stay: Payer: PPO | Attending: Internal Medicine

## 2023-01-23 ENCOUNTER — Inpatient Hospital Stay: Payer: PPO

## 2023-01-23 ENCOUNTER — Inpatient Hospital Stay (HOSPITAL_BASED_OUTPATIENT_CLINIC_OR_DEPARTMENT_OTHER): Payer: PPO | Admitting: Internal Medicine

## 2023-01-23 VITALS — BP 136/61 | HR 54 | Temp 96.3°F | Ht 70.5 in | Wt 205.0 lb

## 2023-01-23 DIAGNOSIS — C07 Malignant neoplasm of parotid gland: Secondary | ICD-10-CM

## 2023-01-23 DIAGNOSIS — H40153 Residual stage of open-angle glaucoma, bilateral: Secondary | ICD-10-CM | POA: Diagnosis not present

## 2023-01-23 DIAGNOSIS — Z79818 Long term (current) use of other agents affecting estrogen receptors and estrogen levels: Secondary | ICD-10-CM | POA: Insufficient documentation

## 2023-01-23 LAB — CBC WITH DIFFERENTIAL (CANCER CENTER ONLY)
Abs Immature Granulocytes: 0.01 10*3/uL (ref 0.00–0.07)
Basophils Absolute: 0.1 10*3/uL (ref 0.0–0.1)
Basophils Relative: 1 %
Eosinophils Absolute: 0.3 10*3/uL (ref 0.0–0.5)
Eosinophils Relative: 5 %
HCT: 39.1 % (ref 39.0–52.0)
Hemoglobin: 12.2 g/dL — ABNORMAL LOW (ref 13.0–17.0)
Immature Granulocytes: 0 %
Lymphocytes Relative: 24 %
Lymphs Abs: 1.3 10*3/uL (ref 0.7–4.0)
MCH: 28.2 pg (ref 26.0–34.0)
MCHC: 31.2 g/dL (ref 30.0–36.0)
MCV: 90.3 fL (ref 80.0–100.0)
Monocytes Absolute: 0.5 10*3/uL (ref 0.1–1.0)
Monocytes Relative: 9 %
Neutro Abs: 3.2 10*3/uL (ref 1.7–7.7)
Neutrophils Relative %: 61 %
Platelet Count: 236 10*3/uL (ref 150–400)
RBC: 4.33 MIL/uL (ref 4.22–5.81)
RDW: 14.5 % (ref 11.5–15.5)
WBC Count: 5.3 10*3/uL (ref 4.0–10.5)
nRBC: 0 % (ref 0.0–0.2)

## 2023-01-23 LAB — CMP (CANCER CENTER ONLY)
ALT: 16 U/L (ref 0–44)
AST: 25 U/L (ref 15–41)
Albumin: 3.5 g/dL (ref 3.5–5.0)
Alkaline Phosphatase: 61 U/L (ref 38–126)
Anion gap: 7 (ref 5–15)
BUN: 20 mg/dL (ref 8–23)
CO2: 24 mmol/L (ref 22–32)
Calcium: 8.7 mg/dL — ABNORMAL LOW (ref 8.9–10.3)
Chloride: 104 mmol/L (ref 98–111)
Creatinine: 1.06 mg/dL (ref 0.61–1.24)
GFR, Estimated: >60 mL/min (ref >60)
Glucose, Bld: 170 mg/dL — ABNORMAL HIGH (ref 70–99)
Potassium: 3.5 mmol/L (ref 3.5–5.1)
Sodium: 135 mmol/L (ref 135–145)
Total Bilirubin: 0.4 mg/dL (ref 0.3–1.2)
Total Protein: 6.6 g/dL (ref 6.5–8.1)

## 2023-01-23 MED ORDER — LEUPROLIDE ACETATE (6 MONTH) 45 MG ~~LOC~~ KIT
45.0000 mg | PACK | Freq: Once | SUBCUTANEOUS | Status: AC
  Administered 2023-01-23: 45 mg via SUBCUTANEOUS

## 2023-01-23 NOTE — Progress Notes (Signed)
Had an u/s at [facility] in July. PET head/neck sch'd for 01/29/23 at [facility].  Has some trouble chewing.

## 2023-01-29 DIAGNOSIS — I700 Atherosclerosis of aorta: Secondary | ICD-10-CM | POA: Diagnosis not present

## 2023-01-29 DIAGNOSIS — I6523 Occlusion and stenosis of bilateral carotid arteries: Secondary | ICD-10-CM | POA: Diagnosis not present

## 2023-01-29 DIAGNOSIS — C07 Malignant neoplasm of parotid gland: Secondary | ICD-10-CM | POA: Diagnosis not present

## 2023-01-29 DIAGNOSIS — I2510 Atherosclerotic heart disease of native coronary artery without angina pectoris: Secondary | ICD-10-CM | POA: Diagnosis not present

## 2023-01-29 DIAGNOSIS — K5790 Diverticulosis of intestine, part unspecified, without perforation or abscess without bleeding: Secondary | ICD-10-CM | POA: Diagnosis not present

## 2023-02-01 ENCOUNTER — Other Ambulatory Visit: Payer: Self-pay | Admitting: Family Medicine

## 2023-02-06 ENCOUNTER — Other Ambulatory Visit (HOSPITAL_COMMUNITY): Payer: Self-pay | Admitting: Otolaryngology

## 2023-02-06 DIAGNOSIS — C07 Malignant neoplasm of parotid gland: Secondary | ICD-10-CM

## 2023-02-06 DIAGNOSIS — C089 Malignant neoplasm of major salivary gland, unspecified: Secondary | ICD-10-CM

## 2023-02-07 ENCOUNTER — Encounter: Payer: Self-pay | Admitting: General Practice

## 2023-02-07 NOTE — Progress Notes (Unsigned)
Simonne Come, MD  Caroleen Hamman, NT No Bx.  Most recent PET CT from 09/19/21 is negative.  If there is more recent imaging, please have it loaded to PACs and then resubmit to the pool for review.  Nedra Hai       Previous Messages    ----- Message ----- From: Caroleen Hamman, NT Sent: 02/06/2023  10:21 AM EDT To: Ir Procedure Requests Subject: [place] FNA SALIVARY GLAND/PAROTID GLAND            Procedure: [place] FNA SALIVARY GLAND/PAROTID GLAND  Reason: Cancer of Parotid Gland, Salivary duct carcinoma  History: PET in chart  Provider: Noe Gens, MD  Contact: [phone]

## 2023-02-13 ENCOUNTER — Encounter: Payer: Self-pay | Admitting: General Practice

## 2023-02-13 NOTE — Progress Notes (Unsigned)
Gilmer Mor, DO  Derin Granquist, Katheren Shams, NT OK for US guided FNA of left neck lymph tissue/possible tumor tissue.  Outside neck CT, image 67/104 CT #2.  Green arrow from outside CT.  Discussed with Dr. Roma Schanz to confirm.  Loreta Ave       Previous Messages    ----- Message ----- From: Caroleen Hamman, NT Sent: 02/09/2023   8:49 AM EDT To: Ir Procedure Requests Subject: [place] FNA SALIVARY GLAND/PAROTID GLAND            Procedure: [place] FNA SALIVARY GLAND/PAROTID GLAND  Reason: Cancer of Parotid Gland, Salivary duct carcinoma Dx: Salivary duct carcinoma (HCC) [C08.9 (ICD-10-CM)]; Cancer of parotid gland  History: CT and PET in care everywhere and PACS  Provider: Noe Gens, MD  Contact: [phone]

## 2023-02-14 ENCOUNTER — Telehealth: Payer: Self-pay | Admitting: Family Medicine

## 2023-02-14 NOTE — Telephone Encounter (Signed)
Patient called in and stated that he tested positive for covid. He just wanted to make Dr. Ermalene Searing aware, and he stated that he isn't feel bad at all.

## 2023-02-15 ENCOUNTER — Ambulatory Visit: Payer: PPO | Admitting: Radiation Oncology

## 2023-02-15 NOTE — Telephone Encounter (Signed)
Mr. Bonebrake notified as instructed by telephone,  He states so far he just has a little bit of a cough and some congestion.  Advised to quarantine 5 days then he can go out but needs to mask up for another 5 days.  Patient states understanding.  He is appreciative of Dr. Daphine Deutscher concern.  He also wanted to let Dr. Ermalene Searing know they have seen something in his neck so he is scheduled for a biopsy at [facility] 03/02/2023.

## 2023-02-15 NOTE — Telephone Encounter (Signed)
noted 

## 2023-02-15 NOTE — Telephone Encounter (Signed)
Left message for Miguel Bradley to return call to office.

## 2023-02-21 NOTE — Telephone Encounter (Signed)
I spoke with pt; pt tested + covid on 02/07/23 and retested today and still positive; pt still has prod cough ? Color of phlegm. No fever, CP,SOB or wheezing. Pt scheduled for neck biopsy later this month and pt wants to be sure he is OK for biopsy. Pt scheduled appt with Dr Alphonsus Sias on 02/22/23 at 12 noon. UC & ED precautions given and pt voiced understanding. Sending note to Dr Alphonsus Sias.

## 2023-02-21 NOTE — Telephone Encounter (Signed)
Pt requested a call back from Lupita Leash to discuss further about the virus he has. Call back # [phone]

## 2023-02-21 NOTE — Telephone Encounter (Signed)
Okay I will check him tomorrow

## 2023-02-21 NOTE — Telephone Encounter (Signed)
Please triage

## 2023-02-22 ENCOUNTER — Ambulatory Visit (INDEPENDENT_AMBULATORY_CARE_PROVIDER_SITE_OTHER): Payer: PPO | Admitting: Internal Medicine

## 2023-02-22 ENCOUNTER — Encounter: Payer: Self-pay | Admitting: Internal Medicine

## 2023-02-22 VITALS — BP 132/74 | HR 48 | Temp 97.6°F | Ht 70.5 in | Wt 199.0 lb

## 2023-02-22 DIAGNOSIS — U071 COVID-19: Secondary | ICD-10-CM | POA: Insufficient documentation

## 2023-02-22 NOTE — Assessment & Plan Note (Signed)
Mild and symptoms have resolved No action needed Okay to proceed with biopsy as planned next week Discussed that COVID tests can remain positive for a while but has limited prognostic meaning

## 2023-02-22 NOTE — Progress Notes (Signed)
Subjective:    Patient ID: Miguel Bradley, male    DOB: 1941-05-24, 82 y.o.   MRN: [id number]  HPI Here due to persistent COVID symptoms  Started with symptoms about 2 weeks ago Coughing but never felt bad--some congestion No fever No chills, sweats or body aches, etc Tested positive 8/28 due to exposure to friend who was positive (on 2nd test) No SOB  Did restest yesterday--still positive He feels fine  Current Outpatient Medications on File Prior to Visit  Medication Sig Dispense Refill   aspirin EC 81 MG tablet Take 81 mg by mouth daily.     dorzolamide-timolol (COSOPT) 22.3-6.8 MG/ML ophthalmic solution Place 1 drop into both eyes 2 (two) times daily.      fenofibrate 160 MG tablet TAKE 1 TABLET BY MOUTH EVERY DAY 90 tablet 3   hydrochlorothiazide (HYDRODIURIL) 25 MG tablet TAKE 1 TABLET BY MOUTH EVERY DAY 90 tablet 3   latanoprost (XALATAN) 0.005 % ophthalmic solution Place 1 drop into both eyes at bedtime.      metFORMIN (GLUCOPHAGE) 500 MG tablet TAKE ONE TABLET BY MOUTH TWICE DAILY WITH A MEAL 180 tablet 1   Multiple Vitamin (MULTIVITAMIN) tablet Take 1 tablet by mouth daily.     nitroGLYCERIN (NITROSTAT) 0.4 MG SL tablet Place 1 tablet (0.4 mg total) under the tongue every 5 (five) minutes as needed for chest pain. 25 tablet prn   Omega-3 Fatty Acids (FISH OIL) 1000 MG CAPS Take 1 capsule by mouth daily.     rosuvastatin (CRESTOR) 20 MG tablet TAKE 1 TABLET BY MOUTH EVERY DAY 90 tablet 1   No current facility-administered medications on file prior to visit.    No Known Allergies  Past Medical History:  Diagnosis Date   Anemia    Aortic stenosis    Mild by most recent echo in 08/2021   Basal cell carcinoma of face    Cancer of parotid gland (HCC) 08/2020   s/p radical left parotidectomy and neck dissection and radiation   Cataract    Diabetes mellitus without complication (HCC)    Erectile dysfunction 06/11/2014   Glaucoma    Hypercalcemia     Hyperparathyroidism, unspecified (HCC)    Impotence of organic origin    Other testicular hypofunction    Pure hyperglyceridemia    Special screening for malignant neoplasm of prostate    Special screening for malignant neoplasms, colon    Unspecified essential hypertension     Past Surgical History:  Procedure Laterality Date   COLONOSCOPY  06/13/2018   Dr. Claudette Head   LEFT HEART CATH AND CORONARY ANGIOGRAPHY Left 03/16/2020   Procedure: LEFT HEART CATH AND CORONARY ANGIOGRAPHY;  Surgeon: Yvonne Kendall, MD;  Location: [facility] INVASIVE CV LAB;  Service: Cardiovascular;  Laterality: Left;   PARATHYROIDECTOMY  2012   PAROTIDECTOMY W/ NECK DISSECTION TOTAL Left 08/2020   [facility]   skin cancer removal     Dr. Jarold Motto   TRIGGER FINGER RELEASE Left 2008    Family History  Problem Relation Age of Onset   Hypertension Mother    Dementia Mother    Diabetes Other        aunt   Hypertension Brother    Hypertension Brother    Hypertension Brother    Hyperlipidemia Brother    Hyperlipidemia Brother    Hyperlipidemia Brother    Hypertension Sister    Bladder Cancer Sister    Hyperlipidemia Sister    Colon cancer Neg Hx  Esophageal cancer Neg Hx    Rectal cancer Neg Hx    Stomach cancer Neg Hx     Social History   Socioeconomic History   Marital status: Married    Spouse name: Not on file   Number of children: 1   Years of education: Not on file   Highest education level: Not on file  Occupational History   Occupation: Naval architect at [employer]  Tobacco Use   Smoking status: Former    Current packs/day: 0.00    Types: Cigarettes    Start date: 06/12/1953    Quit date: 06/12/1978    Years since quitting: 44.7   Smokeless tobacco: Never  Vaping Use   Vaping status: Never Used  Substance and Sexual Activity   Alcohol use: Not Currently    Comment: 1 beer every few weeks   Drug use: No   Sexual activity: Not Currently  Other Topics Concern   Not on file  Social  History Narrative   No exercise, plays golf. Lives in [place]; self- [place]; daughter. puchasing dept in [place]. Rare alcohol. Quit smoking in 1980.    Social Determinants of Health   Financial Resource Strain: Low Risk  (04/26/2021)   Overall Financial Resource Strain (CARDIA)    Difficulty of Paying Living Expenses: Not very hard  Food Insecurity: Not on file  Transportation Needs: Not on file  Physical Activity: Not on file  Stress: Not on file  Social Connections: Not on file  Intimate Partner Violence: Not on file   Review of Systems No loss of smell or taste No N/V Eating okay Has salivary gland biopsy next week--wants to make sure he is okay to proceed    Objective:   Physical Exam Constitutional:      Appearance: Normal appearance.  HENT:     Head:     Comments: No sinus tenderness    Right Ear: Tympanic membrane and ear canal normal.     Left Ear: Tympanic membrane and ear canal normal.     Mouth/Throat:     Pharynx: No oropharyngeal exudate or posterior oropharyngeal erythema.  Neck:     Comments: Slight right parotid enlargement Left neck mass (he states chronic and muscle) No clear nodes Pulmonary:     Effort: Pulmonary effort is normal.     Breath sounds: Normal breath sounds. No wheezing or rales.  Musculoskeletal:     Cervical back: Neck supple.  Neurological:     Mental Status: He is alert.            Assessment & Plan:

## 2023-02-26 ENCOUNTER — Ambulatory Visit (HOSPITAL_COMMUNITY): Payer: PPO

## 2023-02-27 ENCOUNTER — Encounter: Payer: Self-pay | Admitting: Radiology

## 2023-03-01 ENCOUNTER — Other Ambulatory Visit: Payer: Self-pay | Admitting: Radiology

## 2023-03-01 NOTE — H&P (Signed)
Chief Complaint: Hypermetabolic left level 4 lymph node. Team is requesting a biopsy for further evaluation of reoccurrence of left salivary duct carcinoma.   Referring Physician(s): Blumberg,Jeffrey Daphine Deutscher  Supervising Physician: Roanna Banning  Patient Status: [facility] - Out-pt  History of Present Illness: Miguel Bradley is a 82 y.o. male outpatient. 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  Found to have a hypermetabolic left level 4 lymph node. per note from otolaryngology / head and neck surgery at [facility] dated 9.16.24  we discussed there was a post on his PET that looked concerning. I will plan an order to complete an ultrasound guided FNA to rule out concerns for reoccurrence. Team is requesting a left level 4 lymph node biopsy.  Miguel Bradley is at bedside.  Patient alert and laying in bed,calm. Denies any fevers, headache, chest pain, SOB, cough, abdominal pain, nausea, vomiting or bleeding. Return precautions and treatment recommendations and follow-up discussed with the patient and his Miguel Bradley who is agreeable with the plan.     Past Medical History:  Diagnosis Date   Anemia    Aortic stenosis    Mild by most recent echo in 08/2021   Basal cell carcinoma of face    Cancer of parotid gland (HCC) 08/2020   s/p radical left parotidectomy and neck dissection and radiation   Cataract    Diabetes mellitus without complication (HCC)    Erectile dysfunction 06/11/2014   Glaucoma    Hypercalcemia    Hyperparathyroidism, unspecified (HCC)    Impotence of organic origin    Other testicular hypofunction    Pure hyperglyceridemia    Special screening for malignant neoplasm of prostate    Special screening for malignant neoplasms, colon    Unspecified essential hypertension     Past Surgical History:  Procedure Laterality Date   COLONOSCOPY  06/13/2018   Dr. Claudette Head   LEFT HEART CATH  AND CORONARY ANGIOGRAPHY Left 03/16/2020   Procedure: LEFT HEART CATH AND CORONARY ANGIOGRAPHY;  Surgeon: Yvonne Kendall, MD;  Location: [facility] INVASIVE CV LAB;  Service: Cardiovascular;  Laterality: Left;   PARATHYROIDECTOMY  2012   PAROTIDECTOMY W/ NECK DISSECTION TOTAL Left 08/2020   [facility]   skin cancer removal     Dr. Jarold Motto   TRIGGER FINGER RELEASE Left 2008    Allergies: Patient has no known allergies.  Medications: Prior to Admission medications   Medication Sig Start Date End Date Taking? Authorizing Provider  aspirin EC 81 MG tablet Take 81 mg by mouth daily.    [provider]  dorzolamide-timolol (COSOPT) 22.3-6.8 MG/ML ophthalmic solution Place 1 drop into both eyes 2 (two) times daily.  08/18/15   [provider]  fenofibrate 160 MG tablet TAKE 1 TABLET BY MOUTH EVERY DAY 12/22/22   Bedsole, Amy E, MD  hydrochlorothiazide (HYDRODIURIL) 25 MG tablet TAKE 1 TABLET BY MOUTH EVERY DAY 02/01/23   Bedsole, Amy E, MD  latanoprost (XALATAN) 0.005 % ophthalmic solution Place 1 drop into both eyes at bedtime.  11/23/14   [provider]  metFORMIN (GLUCOPHAGE) 500 MG tablet TAKE ONE TABLET BY MOUTH TWICE DAILY WITH A MEAL 08/07/22   Bedsole, Amy E, MD  Multiple Vitamin (MULTIVITAMIN) tablet Take 1 tablet by mouth daily.    [provider]  nitroGLYCERIN (NITROSTAT) 0.4 MG SL tablet Place 1 tablet (0.4 mg total) under the tongue every 5 (five) minutes as needed for chest pain. 03/16/20  End, Cristal Deer, MD  Omega-3 Fatty Acids (FISH OIL) 1000 MG CAPS Take 1 capsule by mouth daily.    [provider]  rosuvastatin (CRESTOR) 20 MG tablet TAKE 1 TABLET BY MOUTH EVERY DAY 09/13/22   Excell Seltzer, MD     Family History  Problem Relation Age of Onset   Hypertension Mother    Dementia Mother    Diabetes Other        aunt   Hypertension Brother    Hypertension Brother    Hypertension Brother    Hyperlipidemia Brother    Hyperlipidemia Brother     Hyperlipidemia Brother    Hypertension Sister    Bladder Cancer Sister    Hyperlipidemia Sister    Colon cancer Neg Hx    Esophageal cancer Neg Hx    Rectal cancer Neg Hx    Stomach cancer Neg Hx     Social History   Socioeconomic History   Marital status: Married    Spouse name: Not on file   Number of children: 1   Years of education: Not on file   Highest education level: Not on file  Occupational History   Occupation: Naval architect at [employer]  Tobacco Use   Smoking status: Former    Current packs/day: 0.00    Types: Cigarettes    Start date: 06/12/1953    Quit date: 06/12/1978    Years since quitting: 44.7   Smokeless tobacco: Never  Vaping Use   Vaping status: Never Used  Substance and Sexual Activity   Alcohol use: Not Currently    Comment: 1 beer every few weeks   Drug use: No   Sexual activity: Not Currently  Other Topics Concern   Not on file  Social History Narrative   No exercise, plays golf. Lives in [place]; self- [place]; Miguel Bradley. puchasing dept in [place]. Rare alcohol. Quit smoking in 1980.    Social Determinants of Health   Financial Resource Strain: Low Risk  (04/26/2021)   Overall Financial Resource Strain (CARDIA)    Difficulty of Paying Living Expenses: Not very hard  Food Insecurity: Not on file  Transportation Needs: Not on file  Physical Activity: Not on file  Stress: Not on file  Social Connections: Not on file      Review of Systems: A 12 point ROS discussed and pertinent positives are indicated in the HPI above.  All other systems are negative.  Review of Systems  Constitutional:  Negative for fever.  HENT:  Negative for congestion.   Respiratory:  Negative for cough and shortness of breath.   Cardiovascular:  Negative for chest pain.  Gastrointestinal:  Negative for abdominal pain.  Neurological:  Negative for headaches.  Psychiatric/Behavioral:  Negative for behavioral problems and confusion.     Vital Signs: BP (!) 143/72    Pulse (!) 53   Temp (!) 97 F (36.1 C) (Temporal)   Resp 18   Ht 5\' 10"  (1.778 m)   Wt 194 lb (88 kg)   SpO2 100%   BMI 27.84 kg/m   Advance Care Plan: The advanced care plan/surrogate decision maker was discussed at the time of visit and documented in the medical record. Miguel Bradley elizabeth    Physical Exam Vitals and nursing note reviewed.  Constitutional:      Appearance: He is well-developed.  HENT:     Head: Normocephalic.  Cardiovascular:     Rate and Rhythm: Normal rate and regular rhythm.     Heart sounds: Normal  heart sounds.  Pulmonary:     Effort: Pulmonary effort is normal.     Breath sounds: Normal breath sounds.  Musculoskeletal:        General: Normal range of motion.     Cervical back: Normal range of motion.  Skin:    General: Skin is warm and dry.  Neurological:     General: No focal deficit present.     Mental Status: He is alert and oriented to person, place, and time.  Psychiatric:        Mood and Affect: Mood normal.        Behavior: Behavior normal.        Thought Content: Thought content normal.        Judgment: Judgment normal.     Imaging: No results found.  Labs:  CBC: Recent Labs    05/22/22 0947 07/21/22 1040 10/23/22 1001 01/23/23 1011  WBC 7.1 7.2 6.2 5.3  HGB 13.1 13.1 12.1* 12.2*  HCT 41.0 39.9 38.3* 39.1  PLT 279 280 241 236    COAGS: No results for input(s): "INR", "APTT" in the last 8760 hours.  BMP: Recent Labs    05/22/22 0947 07/21/22 1040 10/23/22 1001 01/23/23 1011  NA 135 135 139 135  K 3.8 4.0 4.2 3.5  CL 100 100 105 104  CO2 26 26 25 24   GLUCOSE 131* 110* 109* 170*  BUN 22 27* 25* 20  CALCIUM 8.6* 9.1 8.9 8.7*  CREATININE 1.08 1.13 1.57* 1.06  GFRNONAA >60 >60 44* >60    LIVER FUNCTION TESTS: Recent Labs    05/22/22 0947 07/21/22 1040 10/23/22 1001 01/23/23 1011  BILITOT 0.3 0.4 0.3 0.4  AST 26 27 26 25   ALT 16 17 14 16   ALKPHOS 68 67 63 61  PROT 6.8 7.5 6.6 6.6  ALBUMIN 3.6 3.9  3.6 3.5     Assessment and Plan:  82 y.o. Male outpatient. 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  Found to have a hypermetabolic left level 4 lymph node. per note from otolarygology / head and neck surgery at [facility] dated 9.16.24  we discussed there was a post on his PET that looked concerning. I will plan an order to complete an ultrasound guided FNA to rule out concerns for reoccurrence. Team is requesting a left level 4 lymph node biopsy.   No recent labs. Patient is on 81 mg of ASA last dose of . NKDA. Patient has been NPO since midnight.   Risks and benefits of left level 4 lymph node biopsy was discussed with the patient and his Miguel Bradley including, but not limited to bleeding, infection, damage to adjacent structures or low yield requiring additional tests.  All of the questions were answered and there is agreement to proceed.  Consent signed and in chart.   Thank you for this interesting consult.  I greatly enjoyed meeting DONOVON MIDDLEMISS and look forward to participating in their care.  A copy of this report was sent to the requesting provider on this date.  Electronically Signed: Alene Mires, NP 03/02/2023, 12:40 PM   I spent a total of  30 Minutes   in face to face in clinical consultation, greater than 50% of which was counseling/coordinating care for left level 4 neck lymph biopsy

## 2023-03-02 ENCOUNTER — Ambulatory Visit (HOSPITAL_COMMUNITY)
Admission: RE | Admit: 2023-03-02 | Discharge: 2023-03-02 | Disposition: A | Discharge status: Home / Self Care | Payer: PPO | Source: Ambulatory Visit | Attending: Otolaryngology | Admitting: Otolaryngology

## 2023-03-02 ENCOUNTER — Other Ambulatory Visit (HOSPITAL_COMMUNITY): Payer: Self-pay | Admitting: Otolaryngology

## 2023-03-02 ENCOUNTER — Other Ambulatory Visit: Payer: Self-pay | Admitting: Radiology

## 2023-03-02 ENCOUNTER — Other Ambulatory Visit: Payer: Self-pay

## 2023-03-02 DIAGNOSIS — R590 Localized enlarged lymph nodes: Secondary | ICD-10-CM | POA: Insufficient documentation

## 2023-03-02 DIAGNOSIS — E1136 Type 2 diabetes mellitus with diabetic cataract: Secondary | ICD-10-CM | POA: Insufficient documentation

## 2023-03-02 DIAGNOSIS — Z923 Personal history of irradiation: Secondary | ICD-10-CM | POA: Insufficient documentation

## 2023-03-02 DIAGNOSIS — D649 Anemia, unspecified: Secondary | ICD-10-CM | POA: Diagnosis not present

## 2023-03-02 DIAGNOSIS — C089 Malignant neoplasm of major salivary gland, unspecified: Secondary | ICD-10-CM

## 2023-03-02 DIAGNOSIS — I10 Essential (primary) hypertension: Secondary | ICD-10-CM | POA: Diagnosis not present

## 2023-03-02 DIAGNOSIS — Z85818 Personal history of malignant neoplasm of other sites of lip, oral cavity, and pharynx: Secondary | ICD-10-CM | POA: Diagnosis not present

## 2023-03-02 DIAGNOSIS — Z7984 Long term (current) use of oral hypoglycemic drugs: Secondary | ICD-10-CM | POA: Diagnosis not present

## 2023-03-02 DIAGNOSIS — C07 Malignant neoplasm of parotid gland: Secondary | ICD-10-CM

## 2023-03-02 DIAGNOSIS — R911 Solitary pulmonary nodule: Secondary | ICD-10-CM

## 2023-03-02 DIAGNOSIS — I350 Nonrheumatic aortic (valve) stenosis: Secondary | ICD-10-CM | POA: Diagnosis not present

## 2023-03-02 DIAGNOSIS — R599 Enlarged lymph nodes, unspecified: Secondary | ICD-10-CM | POA: Diagnosis not present

## 2023-03-02 LAB — GLUCOSE, CAPILLARY
Glucose-Capillary: 14 mg/dL — CL (ref 70–99)
Glucose-Capillary: 98 mg/dL (ref 70–99)

## 2023-03-02 MED ORDER — FENTANYL CITRATE (PF) 100 MCG/2ML IJ SOLN
INTRAMUSCULAR | Status: AC
  Filled 2023-03-02: qty 2

## 2023-03-02 MED ORDER — MIDAZOLAM HCL 2 MG/2ML IJ SOLN
INTRAMUSCULAR | Status: AC | PRN
Start: 2023-03-02 — End: 2023-03-02
  Administered 2023-03-02 (×2): .5 mg via INTRAVENOUS

## 2023-03-02 MED ORDER — FENTANYL CITRATE (PF) 100 MCG/2ML IJ SOLN
INTRAMUSCULAR | Status: AC | PRN
  Administered 2023-03-02 (×2): 25 ug via INTRAVENOUS

## 2023-03-02 MED ORDER — MIDAZOLAM HCL 2 MG/2ML IJ SOLN
INTRAMUSCULAR | Status: AC
  Filled 2023-03-02: qty 2

## 2023-03-02 MED ORDER — LIDOCAINE HCL (PF) 1 % IJ SOLN
6.0000 mL | Freq: Once | INTRAMUSCULAR | Status: AC
  Administered 2023-03-02: 6 mL via INTRADERMAL

## 2023-03-02 MED ORDER — GELATIN ABSORBABLE 12-7 MM EX MISC: 1.0000 | Freq: Once | CUTANEOUS | Status: DC

## 2023-03-02 MED ORDER — SODIUM CHLORIDE 0.9 % IV SOLN: INTRAVENOUS | Status: DC

## 2023-03-02 NOTE — Procedures (Signed)
Vascular and Interventional Radiology Procedure Note  Patient: Miguel Bradley DOB: [birth year] Medical Record Number: [id number] Note Date/Time: 03/02/23 1:27 PM   Performing Physician: Roanna Banning, MD Assistant(s): None  Diagnosis: HM L cervical (level IV) LN. Hx H&N CA   Procedure: LEFT CERVICAL LYMPH NODE BIOPSY  Anesthesia: Conscious Sedation Complications: None Estimated Blood Loss: Minimal Specimens: Sent for Pathology  Findings:  Successful Ultrasound-guided biopsy of L cervical (level IV) LN A total of 3 samples were obtained. Hemostasis of the tract was achieved using Manual Pressure.  Plan: Bed rest for 1 hours.  See detailed procedure note with images in PACS. The patient tolerated the procedure well without incident or complication and was returned to Recovery in stable condition.    Roanna Banning, MD Vascular and Interventional Radiology [facility]   Pager. [phone] Clinic. [phone]

## 2023-03-06 LAB — SURGICAL PATHOLOGY

## 2023-03-23 DIAGNOSIS — R221 Localized swelling, mass and lump, neck: Secondary | ICD-10-CM | POA: Diagnosis not present

## 2023-03-23 DIAGNOSIS — R918 Other nonspecific abnormal finding of lung field: Secondary | ICD-10-CM | POA: Diagnosis not present

## 2023-03-23 DIAGNOSIS — Z87891 Personal history of nicotine dependence: Secondary | ICD-10-CM | POA: Diagnosis not present

## 2023-03-23 DIAGNOSIS — C089 Malignant neoplasm of major salivary gland, unspecified: Secondary | ICD-10-CM | POA: Diagnosis not present

## 2023-03-23 DIAGNOSIS — R590 Localized enlarged lymph nodes: Secondary | ICD-10-CM | POA: Diagnosis not present

## 2023-03-23 DIAGNOSIS — I6522 Occlusion and stenosis of left carotid artery: Secondary | ICD-10-CM | POA: Diagnosis not present

## 2023-03-27 ENCOUNTER — Telehealth: Payer: Self-pay | Admitting: Family Medicine

## 2023-03-27 DIAGNOSIS — Z0279 Encounter for issue of other medical certificate: Secondary | ICD-10-CM

## 2023-03-27 NOTE — Telephone Encounter (Signed)
Form completed but patient needs either PPD or QuantiFERON gold test. Also please print and attach last office visit note from April 2024, problem list and med list.

## 2023-03-27 NOTE — Telephone Encounter (Signed)
Form placed in Dr. Daphine Deutscher office in box to complete.

## 2023-03-27 NOTE — Telephone Encounter (Signed)
Type of forms received: independent retirement home ppw   Routed to: bedsole's pool   Paperwork received by : Audree Camel   Individual made aware of 3-5 business day turn around (Y/N): Y   Form completed and patient made aware of charges(Y/N): Y    Faxed to : pt requested ppw be mailed back to [facility] on [address], [place] [address].   Form location: bedsole's folder   Pt asked for a call back from Dr. Ermalene Searing to discuss his upcoming surgery for his cancer.

## 2023-03-28 ENCOUNTER — Other Ambulatory Visit (INDEPENDENT_AMBULATORY_CARE_PROVIDER_SITE_OTHER): Payer: PPO

## 2023-03-28 ENCOUNTER — Other Ambulatory Visit: Payer: Self-pay | Admitting: Family Medicine

## 2023-03-28 DIAGNOSIS — Z111 Encounter for screening for respiratory tuberculosis: Secondary | ICD-10-CM

## 2023-03-28 NOTE — Telephone Encounter (Signed)
Returned Mr. Miguel Bradley.  He just wanted to make sure Dr. Ermalene Searing know he will be having cancer surgery again.  He is waiting to hear from [facility] to get scheduled.  The cancer has come back in the lymph nodes in his neck.  He states he was told the type of cancer he has in aggressive and rare.  His Prognosis was 2.5 years.  He just wanted to make sure Dr. Caffie Pinto was informed of this.

## 2023-03-28 NOTE — Telephone Encounter (Signed)
Spoke with Mr. Goedken and scheduled lab appointment for this afternoon at 3:45 pm.  Once results are back, then I will mail paperwork as requested.

## 2023-03-28 NOTE — Telephone Encounter (Signed)
Patient called in and needs to speak with Lupita Leash. He stated that he had something else to inform of her.

## 2023-03-30 LAB — QUANTIFERON-TB GOLD PLUS
Mitogen-NIL: 6.87 [IU]/mL
NIL: 0.05 [IU]/mL
QuantiFERON-TB Gold Plus: NEGATIVE
TB1-NIL: 0.02 [IU]/mL
TB2-NIL: 0.02 [IU]/mL

## 2023-04-02 NOTE — Telephone Encounter (Signed)
Quantiferron Gold test was negative.  Paperwork mailed to [facility] as requested.

## 2023-04-03 DIAGNOSIS — N1831 Chronic kidney disease, stage 3a: Secondary | ICD-10-CM | POA: Diagnosis not present

## 2023-04-03 DIAGNOSIS — E1169 Type 2 diabetes mellitus with other specified complication: Secondary | ICD-10-CM | POA: Diagnosis not present

## 2023-04-03 DIAGNOSIS — I10 Essential (primary) hypertension: Secondary | ICD-10-CM | POA: Diagnosis not present

## 2023-04-03 DIAGNOSIS — I6522 Occlusion and stenosis of left carotid artery: Secondary | ICD-10-CM | POA: Diagnosis not present

## 2023-04-03 DIAGNOSIS — E785 Hyperlipidemia, unspecified: Secondary | ICD-10-CM | POA: Diagnosis not present

## 2023-04-03 DIAGNOSIS — I350 Nonrheumatic aortic (valve) stenosis: Secondary | ICD-10-CM | POA: Diagnosis not present

## 2023-04-03 DIAGNOSIS — E1122 Type 2 diabetes mellitus with diabetic chronic kidney disease: Secondary | ICD-10-CM | POA: Diagnosis not present

## 2023-04-03 DIAGNOSIS — I2510 Atherosclerotic heart disease of native coronary artery without angina pectoris: Secondary | ICD-10-CM | POA: Diagnosis not present

## 2023-04-03 NOTE — Telephone Encounter (Signed)
Called pt- LMOM 

## 2023-04-05 DIAGNOSIS — Z87891 Personal history of nicotine dependence: Secondary | ICD-10-CM | POA: Diagnosis not present

## 2023-04-05 DIAGNOSIS — I129 Hypertensive chronic kidney disease with stage 1 through stage 4 chronic kidney disease, or unspecified chronic kidney disease: Secondary | ICD-10-CM | POA: Diagnosis not present

## 2023-04-05 DIAGNOSIS — N1831 Chronic kidney disease, stage 3a: Secondary | ICD-10-CM | POA: Diagnosis not present

## 2023-04-05 DIAGNOSIS — C089 Malignant neoplasm of major salivary gland, unspecified: Secondary | ICD-10-CM | POA: Diagnosis not present

## 2023-04-05 DIAGNOSIS — Z7952 Long term (current) use of systemic steroids: Secondary | ICD-10-CM | POA: Diagnosis not present

## 2023-04-05 DIAGNOSIS — C7989 Secondary malignant neoplasm of other specified sites: Secondary | ICD-10-CM | POA: Diagnosis not present

## 2023-04-05 DIAGNOSIS — E785 Hyperlipidemia, unspecified: Secondary | ICD-10-CM | POA: Diagnosis not present

## 2023-04-05 DIAGNOSIS — Z23 Encounter for immunization: Secondary | ICD-10-CM | POA: Diagnosis not present

## 2023-04-05 DIAGNOSIS — R221 Localized swelling, mass and lump, neck: Secondary | ICD-10-CM | POA: Diagnosis not present

## 2023-04-05 DIAGNOSIS — C07 Malignant neoplasm of parotid gland: Secondary | ICD-10-CM | POA: Diagnosis not present

## 2023-04-05 DIAGNOSIS — G8918 Other acute postprocedural pain: Secondary | ICD-10-CM | POA: Diagnosis not present

## 2023-04-05 DIAGNOSIS — Z794 Long term (current) use of insulin: Secondary | ICD-10-CM | POA: Diagnosis not present

## 2023-04-05 DIAGNOSIS — Z01818 Encounter for other preprocedural examination: Secondary | ICD-10-CM | POA: Diagnosis not present

## 2023-04-05 DIAGNOSIS — Z9889 Other specified postprocedural states: Secondary | ICD-10-CM | POA: Diagnosis not present

## 2023-04-05 DIAGNOSIS — E1165 Type 2 diabetes mellitus with hyperglycemia: Secondary | ICD-10-CM | POA: Diagnosis not present

## 2023-04-05 DIAGNOSIS — I2510 Atherosclerotic heart disease of native coronary artery without angina pectoris: Secondary | ICD-10-CM | POA: Diagnosis not present

## 2023-04-05 DIAGNOSIS — J90 Pleural effusion, not elsewhere classified: Secondary | ICD-10-CM | POA: Diagnosis not present

## 2023-04-05 DIAGNOSIS — J9811 Atelectasis: Secondary | ICD-10-CM | POA: Diagnosis not present

## 2023-04-05 DIAGNOSIS — E1122 Type 2 diabetes mellitus with diabetic chronic kidney disease: Secondary | ICD-10-CM | POA: Diagnosis not present

## 2023-04-05 DIAGNOSIS — Z79899 Other long term (current) drug therapy: Secondary | ICD-10-CM | POA: Diagnosis not present

## 2023-04-06 LAB — HEMOGLOBIN A1C: Hemoglobin A1C: 6.1

## 2023-04-18 DIAGNOSIS — C089 Malignant neoplasm of major salivary gland, unspecified: Secondary | ICD-10-CM | POA: Diagnosis not present

## 2023-04-18 DIAGNOSIS — Z09 Encounter for follow-up examination after completed treatment for conditions other than malignant neoplasm: Secondary | ICD-10-CM | POA: Diagnosis not present

## 2023-05-02 ENCOUNTER — Other Ambulatory Visit: Payer: Self-pay | Admitting: Family Medicine

## 2023-05-03 ENCOUNTER — Encounter: Payer: Self-pay | Admitting: Otolaryngology

## 2023-05-08 DIAGNOSIS — C07 Malignant neoplasm of parotid gland: Secondary | ICD-10-CM | POA: Diagnosis not present

## 2023-05-17 ENCOUNTER — Ambulatory Visit (INDEPENDENT_AMBULATORY_CARE_PROVIDER_SITE_OTHER): Payer: PPO | Admitting: Family Medicine

## 2023-05-17 ENCOUNTER — Encounter: Payer: Self-pay | Admitting: Family Medicine

## 2023-05-17 VITALS — BP 130/72 | HR 69 | Temp 98.1°F | Ht 70.5 in | Wt 200.2 lb

## 2023-05-17 DIAGNOSIS — I10 Essential (primary) hypertension: Secondary | ICD-10-CM

## 2023-05-17 DIAGNOSIS — C44309 Unspecified malignant neoplasm of skin of other parts of face: Secondary | ICD-10-CM | POA: Diagnosis not present

## 2023-05-17 DIAGNOSIS — E1142 Type 2 diabetes mellitus with diabetic polyneuropathy: Secondary | ICD-10-CM | POA: Diagnosis not present

## 2023-05-17 DIAGNOSIS — E663 Overweight: Secondary | ICD-10-CM | POA: Diagnosis not present

## 2023-05-17 DIAGNOSIS — C07 Malignant neoplasm of parotid gland: Secondary | ICD-10-CM | POA: Diagnosis not present

## 2023-05-17 DIAGNOSIS — E11319 Type 2 diabetes mellitus with unspecified diabetic retinopathy without macular edema: Secondary | ICD-10-CM | POA: Diagnosis not present

## 2023-05-17 DIAGNOSIS — E1169 Type 2 diabetes mellitus with other specified complication: Secondary | ICD-10-CM | POA: Diagnosis not present

## 2023-05-17 DIAGNOSIS — E1149 Type 2 diabetes mellitus with other diabetic neurological complication: Secondary | ICD-10-CM

## 2023-05-17 DIAGNOSIS — Z7984 Long term (current) use of oral hypoglycemic drugs: Secondary | ICD-10-CM

## 2023-05-17 DIAGNOSIS — N289 Disorder of kidney and ureter, unspecified: Secondary | ICD-10-CM | POA: Diagnosis not present

## 2023-05-17 DIAGNOSIS — E785 Hyperlipidemia, unspecified: Secondary | ICD-10-CM

## 2023-05-17 DIAGNOSIS — Z6828 Body mass index (BMI) 28.0-28.9, adult: Secondary | ICD-10-CM

## 2023-05-17 LAB — MICROALBUMIN / CREATININE URINE RATIO
Creatinine,U: 103.8 mg/dL
Microalb Creat Ratio: 1.1 mg/g (ref 0.0–30.0)
Microalb, Ur: 1.1 mg/dL (ref 0.0–1.9)

## 2023-05-17 NOTE — Assessment & Plan Note (Signed)
Now resolved  GFR > 60

## 2023-05-17 NOTE — Assessment & Plan Note (Signed)
LEFT parotid gland carcinoma s/p resection/LND followed by adjuvant RT in 2022 (70Gy) now with LEFT neck recurrence in level IV s/p neck dissection  03/2023(2.3cm, invades jugular vein, positive soft tissue margin) .  Reviewed last OV 05/08/2023 from [facility] Dr. Carles Collet.. Rad ONC... adjuvant radiation recommended x 5 weeks

## 2023-05-17 NOTE — Assessment & Plan Note (Signed)
Chronic, stable control, minimally symptomatic  Secondary to diabetes

## 2023-05-17 NOTE — Progress Notes (Signed)
Patient ID: Miguel Bradley, male    DOB: [birth year], 82 y.o.   MRN: [id number]  This visit was conducted in person.  BP 130/72 (BP Location: Left Arm, Patient Position: Sitting, Cuff Size: Large)   Pulse 69   Temp 98.1 F (36.7 C) (Temporal)   Ht 5' 10.5" (1.791 m)   Wt 200 lb 4 oz (90.8 kg)   SpO2 96%   BMI 28.33 kg/m    CC: Chief Complaint  Patient presents with   Diabetes    Subjective:   HPI: Miguel Bradley is a 82 y.o. male presenting on 05/17/2023 for Diabetes  LEFT parotid gland carcinoma s/p resection/LND followed by adjuvant RT in 2022 (70Gy) now with LEFT neck recurrence in level IV s/p neck dissection  03/2023(2.3cm, invades jugular vein, positive soft tissue margin) .  Reviewed last OV 05/08/2023 from [facility] Dr. Carles Collet.. Rad ONC... adjuvant radiation recommended x 5 weeks   Now living at retirement home: Elevated Cholesterol:  LDL at goal < 70  ( CAD) on crestor and fenofibrate Lab Results  Component Value Date   CHOL 120 11/07/2022   HDL 40.70 11/07/2022   LDLCALC 45 11/07/2022   LDLDIRECT 70.0 12/31/2020   TRIG 171.0 (H) 11/07/2022   CHOLHDL 3 11/07/2022  Using medications without problems: Muscle aches:  Diet compliance: good Exercise: playing golf again. Other complaints:   Hypertension:    At goal on HCTZ 25 mg daily BP Readings from Last 3 Encounters:  05/17/23 130/72  03/02/23 124/61  02/22/23 132/74  Using medication without problems or lightheadedness:  none Chest pain with exertion: none Edema:none Short of breath: some after surgery... but felt likely due to recent surgery... surgeon say likely to resolve. Average home BPs: Other issues:  Diabetes:    Good  control with  lifestyle change/ overall weight loss, on metformin 500 mg BID Lab Results  Component Value Date   HGBA1C 6.1 04/06/2023  Using medications without difficulties: Hypoglycemic episodes: Hyperglycemic episodes: Feet problems: none Blood Sugars averaging: not  checking eye exam within last year: yes     Wt Readings from Last 3 Encounters:  05/17/23 200 lb 4 oz (90.8 kg)  03/02/23 194 lb (88 kg)  02/22/23 199 lb (90.3 kg)    Relevant past medical, surgical, family and social history reviewed and updated as indicated. Interim medical history since our last visit reviewed. Allergies and medications reviewed and updated. Outpatient Medications Prior to Visit  Medication Sig Dispense Refill   aspirin EC 81 MG tablet Take 81 mg by mouth daily.     dorzolamide-timolol (COSOPT) 22.3-6.8 MG/ML ophthalmic solution Place 1 drop into both eyes 2 (two) times daily.      fenofibrate 160 MG tablet TAKE 1 TABLET BY MOUTH EVERY DAY 90 tablet 3   hydrochlorothiazide (HYDRODIURIL) 25 MG tablet TAKE 1 TABLET BY MOUTH EVERY DAY 90 tablet 3   latanoprost (XALATAN) 0.005 % ophthalmic solution Place 1 drop into both eyes at bedtime.      metFORMIN (GLUCOPHAGE) 500 MG tablet TAKE ONE TABLET BY MOUTH TWICE DAILY WITH A MEAL 180 tablet 1   Multiple Vitamin (MULTIVITAMIN) tablet Take 1 tablet by mouth daily.     nitroGLYCERIN (NITROSTAT) 0.4 MG SL tablet Place 1 tablet (0.4 mg total) under the tongue every 5 (five) minutes as needed for chest pain. 25 tablet prn   Omega-3 Fatty Acids (FISH OIL) 1000 MG CAPS Take 1 capsule by mouth daily.     rosuvastatin (  CRESTOR) 20 MG tablet TAKE 1 TABLET BY MOUTH EVERY DAY 90 tablet 1   No facility-administered medications prior to visit.     Per HPI unless specifically indicated in ROS section below Review of Systems  Constitutional:  Negative for fatigue and fever.  HENT:  Negative for ear pain.   Eyes:  Negative for pain.  Respiratory:  Negative for cough and shortness of breath.   Cardiovascular:  Negative for chest pain, palpitations and leg swelling.  Gastrointestinal:  Negative for abdominal pain.  Genitourinary:  Negative for dysuria.  Musculoskeletal:  Negative for arthralgias.  Neurological:  Negative for syncope,  light-headedness and headaches.  Psychiatric/Behavioral:  Negative for dysphoric mood.    Objective:  BP 130/72 (BP Location: Left Arm, Patient Position: Sitting, Cuff Size: Large)   Pulse 69   Temp 98.1 F (36.7 C) (Temporal)   Ht 5' 10.5" (1.791 m)   Wt 200 lb 4 oz (90.8 kg)   SpO2 96%   BMI 28.33 kg/m   Wt Readings from Last 3 Encounters:  05/17/23 200 lb 4 oz (90.8 kg)  03/02/23 194 lb (88 kg)  02/22/23 199 lb (90.3 kg)      Physical Exam Constitutional:      General: He is not in acute distress.    Appearance: Normal appearance. He is well-developed. He is not ill-appearing or toxic-appearing.  HENT:     Head: Normocephalic and atraumatic.     Comments: Post surgical changes to left side of face.. cannot fully close left eye, lip droop and some trouble breathing though ;eft nostril    Right Ear: Hearing, tympanic membrane, ear canal and external ear normal.     Left Ear: Hearing, tympanic membrane, ear canal and external ear normal.     Nose: Nose normal.     Mouth/Throat:     Pharynx: Uvula midline.  Eyes:     General: Lids are normal. Lids are everted, no foreign bodies appreciated.     Conjunctiva/sclera: Conjunctivae normal.     Pupils: Pupils are equal, round, and reactive to light.  Neck:     Thyroid: No thyroid mass or thyromegaly.     Vascular: No carotid bruit.     Trachea: Trachea and phonation normal.     Comments: Abnormal musculature of left neck from surgical changes. Cardiovascular:     Rate and Rhythm: Normal rate and regular rhythm.     Pulses: Normal pulses.     Heart sounds: S1 normal and S2 normal. Heart sounds not distant. No murmur heard.    No friction rub. No gallop.     Comments: No peripheral edema Pulmonary:     Effort: Pulmonary effort is normal. No respiratory distress.     Breath sounds: Normal breath sounds. No wheezing, rhonchi or rales.  Abdominal:     General: Bowel sounds are normal.     Palpations: Abdomen is soft.      Tenderness: There is no abdominal tenderness. There is no guarding or rebound.     Hernia: No hernia is present.  Musculoskeletal:     Cervical back: Normal range of motion and neck supple.  Lymphadenopathy:     Cervical: No cervical adenopathy.  Skin:    General: Skin is warm and dry.     Findings: No rash.  Neurological:     Mental Status: He is alert.     Cranial Nerves: No cranial nerve deficit.     Sensory: No sensory deficit.  Gait: Gait normal.     Deep Tendon Reflexes: Reflexes are normal and symmetric.  Psychiatric:        Speech: Speech normal.        Behavior: Behavior normal.        Thought Content: Thought content normal.        Judgment: Judgment normal.    Diabetic foot exam: Normal inspection No skin breakdown No calluses  Normal DP pulses decreaed sensation to light touch and monofilament on right great toe Nails normal     Results for orders placed or performed in visit on 05/17/23  Hemoglobin A1c  Result Value Ref Range   Hemoglobin A1C 6.1      COVID 19 screen:  No recent travel or known exposure to COVID19 The patient denies respiratory symptoms of COVID 19 at this time. The importance of social distancing was discussed today.   Assessment and Plan  Problem List Items Addressed This Visit     Cancer of parotid gland (HCC) (Chronic)    LEFT parotid gland carcinoma s/p resection/LND followed by adjuvant RT in 2022 (70Gy) now with LEFT neck recurrence in level IV s/p neck dissection  03/2023(2.3cm, invades jugular vein, positive soft tissue margin) .  [facility]. Carles Collet.. Rad ONC... adjuvant radiation recommended x 5 weeks      Diabetic neuropathy (HCC) (Chronic)    Chronic, stable control, minimally symptomatic  Secondary to diabetes      Essential hypertension (Chronic)    Stable, chronic.  Continue current medication.  HCTZ 25 mg daily      Hyperlipidemia associated with type 2 diabetes mellitus  (HCC) (Chronic)    Stable, chronic.  Continue current medication.  LDL at goal less than 70  Crestor 20 mg daily  fenofibrate 160 mg daily      Malignant neoplasm of skin of parts of face   Overweight with body mass index (BMI) of 28 to 28.9 in adult    Encouraged exercise, weight loss, healthy eating habits.       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  Continue current medication.  Discussed possibly change metformin to Jardiance given cardiovascular benefit.  He will discuss with his daughter and let me know.  Metformin 500 mg p.o. twice daily      Other Visit Diagnoses     Type 2 diabetes mellitus with retinopathy, without long-term current use of insulin, macular edema presence unspecified, unspecified laterality, unspecified retinopathy severity (HCC)    -  Primary   Relevant Orders   Microalbumin / creatinine urine ratio      Kerby Nora, MD

## 2023-05-17 NOTE — Assessment & Plan Note (Signed)
Encouraged exercise, weight loss, healthy eating habits. ? ?

## 2023-05-17 NOTE — Assessment & Plan Note (Signed)
Stable, chronic.  Continue current medication.  Discussed possibly change metformin to Jardiance given cardiovascular benefit.  He will discuss with his daughter and let me know.  Metformin 500 mg p.o. twice daily

## 2023-05-17 NOTE — Assessment & Plan Note (Signed)
Stable, chronic.  Continue current medication.  LDL at goal less than 70  Crestor 20 mg daily  fenofibrate 160 mg daily 

## 2023-05-17 NOTE — Assessment & Plan Note (Signed)
Stable, chronic.  Continue current medication.  HCTZ 25 mg daily 

## 2023-05-22 DIAGNOSIS — C07 Malignant neoplasm of parotid gland: Secondary | ICD-10-CM | POA: Diagnosis not present

## 2023-05-22 DIAGNOSIS — Z5111 Encounter for antineoplastic chemotherapy: Secondary | ICD-10-CM | POA: Diagnosis not present

## 2023-05-22 DIAGNOSIS — Z1159 Encounter for screening for other viral diseases: Secondary | ICD-10-CM | POA: Diagnosis not present

## 2023-05-25 ENCOUNTER — Inpatient Hospital Stay (HOSPITAL_BASED_OUTPATIENT_CLINIC_OR_DEPARTMENT_OTHER): Payer: PPO | Admitting: Internal Medicine

## 2023-05-25 ENCOUNTER — Encounter: Payer: Self-pay | Admitting: Internal Medicine

## 2023-05-25 ENCOUNTER — Inpatient Hospital Stay: Payer: PPO | Attending: Internal Medicine

## 2023-05-25 VITALS — BP 139/68 | HR 74 | Temp 96.5°F | Ht 70.5 in | Wt 199.6 lb

## 2023-05-25 DIAGNOSIS — C07 Malignant neoplasm of parotid gland: Secondary | ICD-10-CM

## 2023-05-25 DIAGNOSIS — Z79818 Long term (current) use of other agents affecting estrogen receptors and estrogen levels: Secondary | ICD-10-CM | POA: Insufficient documentation

## 2023-05-25 LAB — CBC WITH DIFFERENTIAL (CANCER CENTER ONLY)
Abs Immature Granulocytes: 0.02 10*3/uL (ref 0.00–0.07)
Basophils Absolute: 0.1 10*3/uL (ref 0.0–0.1)
Basophils Relative: 1 %
Eosinophils Absolute: 0.3 10*3/uL (ref 0.0–0.5)
Eosinophils Relative: 4 %
HCT: 41.6 % (ref 39.0–52.0)
Hemoglobin: 13.2 g/dL (ref 13.0–17.0)
Immature Granulocytes: 0 %
Lymphocytes Relative: 23 %
Lymphs Abs: 1.6 10*3/uL (ref 0.7–4.0)
MCH: 27.7 pg (ref 26.0–34.0)
MCHC: 31.7 g/dL (ref 30.0–36.0)
MCV: 87.4 fL (ref 80.0–100.0)
Monocytes Absolute: 0.6 10*3/uL (ref 0.1–1.0)
Monocytes Relative: 9 %
Neutro Abs: 4.6 10*3/uL (ref 1.7–7.7)
Neutrophils Relative %: 63 %
Platelet Count: 291 10*3/uL (ref 150–400)
RBC: 4.76 MIL/uL (ref 4.22–5.81)
RDW: 15.7 % — ABNORMAL HIGH (ref 11.5–15.5)
WBC Count: 7.2 10*3/uL (ref 4.0–10.5)
nRBC: 0 % (ref 0.0–0.2)

## 2023-05-25 LAB — CMP (CANCER CENTER ONLY)
ALT: 16 U/L (ref 0–44)
AST: 27 U/L (ref 15–41)
Albumin: 3.8 g/dL (ref 3.5–5.0)
Alkaline Phosphatase: 64 U/L (ref 38–126)
Anion gap: 9 (ref 5–15)
BUN: 15 mg/dL (ref 8–23)
CO2: 27 mmol/L (ref 22–32)
Calcium: 9.6 mg/dL (ref 8.9–10.3)
Chloride: 101 mmol/L (ref 98–111)
Creatinine: 1.14 mg/dL (ref 0.61–1.24)
GFR, Estimated: >60 mL/min (ref >60)
Glucose, Bld: 127 mg/dL — ABNORMAL HIGH (ref 70–99)
Potassium: 4.1 mmol/L (ref 3.5–5.1)
Sodium: 137 mmol/L (ref 135–145)
Total Bilirubin: 0.6 mg/dL (ref <1.2)
Total Protein: 7 g/dL (ref 6.5–8.1)

## 2023-05-25 NOTE — Progress Notes (Signed)
Had cancer surgery [facility] in Nov 2024. Starts radiation at [facility] 06/04/23 for 5 weeks. Dr. Rayetta Humphrey [facility] [phone] fax: [phone].  CT head and neck [facility] 05/22/23.

## 2023-05-25 NOTE — Assessment & Plan Note (Addendum)
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 Stage: pT4aN3b-stage IVb.  HER-2 positive; androgen receptor positive.] Patient currently s/p adjuvant radiation-[finished July 16th. 2022].   # AUG-PET -recurrence in neck.  OCT 2024-solitary recurrence in the neck s/p biopsy.  Also status post excisional at [facility] [NOV 2024].  # Patient s/p evaluation at [facility] radiation oncology Dr. Pearlstein-currently awaiting radiation-from 23rd, DEC to 31st JAN 2025.  Discussed regarding use of adjuvant chemotherapy- [based on retrospective data from Lottie Dawson al oncologist, 2020; weekly carboplatinum Taxol Herceptin with radiation. [Protocol: carboplatin (area under the curve, 1.5) with paclitaxel (45 mg/m2 ) and trastuzumab (TCH; 2 mg/kg after a 6 mg/kg week 1 loading dose) intravenously concurrently with adjuvant radiotherapy (RT), then continue the adjuvant [facility] for up to 12 additional weeks post-RT, followed by a total of 1 year of maintenance trastuzumab every 21 days (6 mg/kg)].   # However patient will be getting radiation at [facility] location twice a day for 5 weeks.  Given the logistics-I would recommend evaluation with [facility] oncology-for consideration of above chemotherapy with radiation.  I discussed with Dr. Carles Collet, who is in agreement with the plan.  Also discussed with Dr. Augusto Garbe.   # Injection site pain -premedication with Emla cream. stable  # Lymphedema neck s/p surgery-s/p  lymphedema treatment/prevention- stable  *eligard q 6 M-last 01/23/2023 Usually 4m-fu  # DISPOSITION:  # follow up in  2  months- MD labs- cbc/cmp; Eligard-- Dr.B  # 40 minutes face-to-face with the patient discussing the above plan of care; more than 50% of time spent on prognosis/  natural history; counseling and coordination.  Also Tried to Reach the Patient's Daughter, Cordelia Pen to reach left a voicemail with my above recommendations.

## 2023-05-25 NOTE — Progress Notes (Signed)
[facility] CONSULT NOTE  Patient Care Team: Excell Seltzer, MD as PCP - General (Family Medicine) End, Cristal Deer, MD as PCP - Cardiology (Cardiology) Sherrie George, MD as Consulting Physician (Ophthalmology) Irene Limbo., MD as Referring Physician (Ophthalmology) Debbrah Alar, MD as Referring Physician (Dermatology) Meryl Dare, MD as Consulting Physician (Gastroenterology) Wynona Meals., MD as Referring Physician (Dentistry) Vilinda Flake, [facility] (Inactive) as Pharmacist (Pharmacist) Earna Coder, MD as Consulting Physician (Hematology and Oncology) Excell Seltzer, MD as Consulting Physician (Family Medicine)  CHIEF COMPLAINTS/PURPOSE OF CONSULTATION: parotid cancer  Oncology History Overview Note  Addendum    Upon clinician request, additional immunostains are performed at [facility] on block F2, with the following results in the tumor cells:   HER2 ([place], clone [id number]) Interpretation: Positive IHC Score: 3+ (using the ASCO/CAP breast biomarker scoring guidelines; there are no consensus guideline for scoring HER2 in salivary duct carcinoma)   This slide has also been reviewed by Dr. Tobie Poet [facility] breast pathology) who concurs.  Addendum electronically signed by Alyce Pagan, MD on 09/03/2020 at  3:21 PM  Diagnosis    A. "Tissue around stylomastoid foramen"  - Fibrovascular tissue extensively involved by carcinoma   B. "Main trunk facial nerve"  - Nerve and fibrous tissue extensively involved by carcinoma     C. "Distal facial nerve" - Nerve tissue; negative for carcinoma     D. "Proximal mastoid nerve margin" - Nerve tissue; negative for carcinoma   E: Left upper superficial parotid, parotidectomy - Benign parotid parenchyma with focal acute and chronic inflammation - Negative for carcinoma   F: Left inferior parotid with sternocleidomastoid muscle, resection - High-grade salivary gland carcinoma, most consistent with  salivary duct carcinoma (see comment and synoptic report for further details) - Cauterized tumor present at inked deep surgical margin within fibrous tissue adjacent to skeletal muscle - Metastatic carcinoma in 1.1 cm nodal conglomerate and 4 of 4 intact lymph nodes (at least 5/5); positive for extranodal extension    G: Lymph node, left neck level 5, lymphadenectomy - Benign adipose; no lymph nodes or carcinoma identified   H: Lymph node, left neck level 1, lymphadenectomy - Metastatic carcinoma in 1 of 5 lymph nodes (1/5); maximum size of metastasis 2 mm; negative for extranodal extension.   I: Left omohyoid, excision - Benign skeletal muscle; negative for carcinoma   J: Left deep lobe parotid, excision - Benign parotid tissue; negative for carcinoma   K: Parotid tissue, biopsy - Benign parotid tissue - No metastatic carcinoma identified in 1 lymph nodes (0/1)   L: Left tissue over mastoid, biopsy - Benign parotid parenchyma, dense fibrous tissue, and bone; negative for carcinoma   M: "Stylomastoid foramen stitch on proximal nerve", excision - Large nerve, negative for perineural/intraneural carcinoma; stitched margin negative - Attached fibrous tissue and parotid parenchyma positive for carcinoma, focally invading around but not within large nerve segment, with crushed/cauterized tumor present at tissue edges   N: Tissue over styloid process, biopsy - Benign parotid tissue; negative for carcinoma   O: Posterior digastric margin, biopsy - Benign skeletal muscle; negative for carcinoma   P: Left neck dissections level 1 through 5, lymphadenectomy - Metastatic carcinoma in 11 of 26 lymph nodes (11/26); maximum size of metastasis 20 mm; two nodes (16 mm and 14 mm) positive for extranodal extension (ENEma, up to 4 mm beyond capsule)   Diagnosis: 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 -----------------------------------------------------------------   07/13/10: Right parathyroidectomy Pathology: Hypercellular parathyroid gland   -------------------------------------------------------------------------------------   # MARCH 2022- CT chest- [[facility]] Subcentimeter pulmonary nodules measuring up to 0.8 cm, indeterminate. No comparison available. Attention on follow-up is recommended.    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 Stage: pT4aN3b-stage IVb.  HER-2 positive; androgen receptor positive.]  PET scan-negative for distant metastatic disease.   # AUG - PET- LNbx- OCT 2024-solitary recurrence in the neck s/p biopsy.  Also status post excisional at [facility] [NOV 2024].  # CAD [card]; DM- 2; CKD-III     Cancer of parotid gland (HCC)  09/11/2020 Initial Diagnosis   Cancer of parotid gland (HCC)   09/16/2020 Cancer Staging   Staging form: Major Salivary Glands, AJCC 8th Edition - Pathologic: Stage IVB (pT4a, pN3b, cM0) - Signed by Earna Coder, MD on 09/16/2020   HISTORY OF PRESENTING ILLNESS: Alone. Ambulating independently.  Miguel Bradley 82 y.o.  male pleasant patient with locally advanced high-grade salivary gland/parotid cancer HER2 positive;AR positive-T4AN3-stage IVb is currently s/p adjuvant radiation; on Eligard q6M is here for follow-up.   Patient noted to have recurrent high-grade salivary cancer in his neck on recent PET scan in August 2024.  Patient underwent a biopsy and  subsequent underwent resection at [facility].  Patient status post evaluation with radiation oncology at [facility].  Is recommended adjuvant radiation.   Patient otherwise denies any new lumps or bumps.   Denies any weight loss.  No hot flashes.  Denies any significant joint pains or bone pain.  Chronic mild fatigue.   Review of Systems  Constitutional:  Positive for malaise/fatigue. Negative for chills, diaphoresis and fever.  HENT:  Negative for nosebleeds and sore throat.   Eyes:  Negative for double vision.  Respiratory:  Negative for cough, hemoptysis, sputum production, shortness of breath and wheezing.   Cardiovascular:  Negative for chest pain, palpitations, orthopnea and leg swelling.  Gastrointestinal:  Negative for abdominal pain, blood in stool, constipation, diarrhea, heartburn, melena, nausea and vomiting.  Genitourinary:  Negative for dysuria, frequency and urgency.  Musculoskeletal:  Positive for joint pain. Negative for back pain.  Skin: Negative.  Negative for itching and rash.  Neurological:  Negative for dizziness, tingling, focal weakness, weakness and headaches.  Endo/Heme/Allergies:  Does not bruise/bleed easily.  Psychiatric/Behavioral:  Negative for depression. The patient is not nervous/anxious and does not have insomnia.      MEDICAL HISTORY:  Past Medical History:  Diagnosis Date   Anemia    Aortic stenosis    Mild by most recent echo in 08/2021   Basal cell carcinoma of face    Cancer of parotid gland (HCC) 08/2020   s/p radical left parotidectomy and neck dissection and radiation   Cataract    Diabetes mellitus without complication (HCC)    Erectile dysfunction 06/11/2014   Glaucoma    Hypercalcemia    Hyperparathyroidism, unspecified (HCC)    Impotence of organic origin    Other testicular hypofunction    Pure hyperglyceridemia    Special screening for malignant neoplasm of prostate  Special screening for malignant neoplasms, colon    Unspecified essential  hypertension     SURGICAL HISTORY: Past Surgical History:  Procedure Laterality Date   COLONOSCOPY  06/13/2018   Dr. Claudette Head   LEFT HEART CATH AND CORONARY ANGIOGRAPHY Left 03/16/2020   Procedure: LEFT HEART CATH AND CORONARY ANGIOGRAPHY;  Surgeon: Yvonne Kendall, MD;  Location: [facility] INVASIVE CV LAB;  Service: Cardiovascular;  Laterality: Left;   PARATHYROIDECTOMY  2012   PAROTIDECTOMY W/ NECK DISSECTION TOTAL Left 08/2020   [facility]   skin cancer removal     Dr. Jarold Motto   TRIGGER FINGER RELEASE Left 2008    SOCIAL HISTORY: Social History   Socioeconomic History   Marital status: Married    Spouse name: Not on file   Number of children: 1   Years of education: Not on file   Highest education level: Not on file  Occupational History   Occupation: Naval architect at [employer]  Tobacco Use   Smoking status: Former    Current packs/day: 0.00    Types: Cigarettes    Start date: 06/12/1953    Quit date: 06/12/1978    Years since quitting: 44.9   Smokeless tobacco: Never  Vaping Use   Vaping status: Never Used  Substance and Sexual Activity   Alcohol use: Not Currently    Comment: 1 beer every few weeks   Drug use: No   Sexual activity: Not Currently  Other Topics Concern   Not on file  Social History Narrative   No exercise, plays golf. Lives in [place]; self- Reserve; daughter. 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 Rare alcohol. Quit smoking in 1980.    Social Drivers of Corporate investment banker Strain: Low Risk  (04/06/2023)   Received from [facility]   Overall Financial Resource Strain (CARDIA)    Difficulty of Paying Living Expenses: Not hard at all  Food Insecurity: No Food Insecurity (04/06/2023)   Received from [facility]   Hunger Vital Sign    Worried About Running Out of Food in the Last Year: Never true    Ran Out of Food in the Last Year: Never true  Transportation Needs: No Transportation Needs (04/06/2023)   Received from [facility] -  Transportation    Lack of Transportation (Medical): No    Lack of Transportation (Non-Medical): No  Physical Activity: Not on file  Stress: Not on file  Social Connections: Not on file  Intimate Partner Violence: Not on file    FAMILY HISTORY: Family History  Problem Relation Age of Onset   Hypertension Mother    Dementia Mother    Diabetes Other        aunt   Hypertension Brother    Hypertension Brother    Hypertension Brother    Hyperlipidemia Brother    Hyperlipidemia Brother    Hyperlipidemia Brother    Hypertension Sister    Bladder Cancer Sister    Hyperlipidemia Sister    Colon cancer Neg Hx    Esophageal cancer Neg Hx    Rectal cancer Neg Hx    Stomach cancer Neg Hx     ALLERGIES:  has no known allergies.  MEDICATIONS:  Current Outpatient Medications  Medication Sig Dispense Refill   aspirin EC 81 MG tablet Take 81 mg by mouth daily.     dorzolamide-timolol (COSOPT) 22.3-6.8 MG/ML ophthalmic solution Place 1 drop into both eyes 2 (two) times daily.      fenofibrate 160 MG  tablet TAKE 1 TABLET BY MOUTH EVERY DAY 90 tablet 3   hydrochlorothiazide (HYDRODIURIL) 25 MG tablet TAKE 1 TABLET BY MOUTH EVERY DAY 90 tablet 3   latanoprost (XALATAN) 0.005 % ophthalmic solution Place 1 drop into both eyes at bedtime.      metFORMIN (GLUCOPHAGE) 500 MG tablet TAKE ONE TABLET BY MOUTH TWICE DAILY WITH A MEAL 180 tablet 1   Multiple Vitamin (MULTIVITAMIN) tablet Take 1 tablet by mouth daily.     nitroGLYCERIN (NITROSTAT) 0.4 MG SL tablet Place 1 tablet (0.4 mg total) under the tongue every 5 (five) minutes as needed for chest pain. 25 tablet prn   Omega-3 Fatty Acids (FISH OIL) 1000 MG CAPS Take 1 capsule by mouth daily.     rosuvastatin (CRESTOR) 20 MG tablet TAKE 1 TABLET BY MOUTH EVERY DAY 90 tablet 1   No current facility-administered medications for this visit.      Marland Kitchen  PHYSICAL EXAMINATION: ECOG PERFORMANCE STATUS: 0 - Asymptomatic  Vitals:   05/25/23 1032  05/25/23 1042  BP: (!) 136/112 139/68  Pulse: 74   Temp: (!) 96.5 F (35.8 C)   SpO2: 94%     Filed Weights   05/25/23 1032  Weight: 199 lb 9.6 oz (90.5 kg)    Left neck sided soft  swelling noted.   Physical Exam HENT:     Head: Normocephalic and atraumatic.     Mouth/Throat:     Pharynx: No oropharyngeal exudate.  Eyes:     Pupils: Pupils are equal, round, and reactive to light.  Neck:     Comments: Incision from the left neck dissection; parotidectomy noted.  Well-healing.  No evidence of any infection. Cardiovascular:     Rate and Rhythm: Normal rate and regular rhythm.  Pulmonary:     Effort: No respiratory distress.     Breath sounds: No wheezing.  Abdominal:     General: Bowel sounds are normal. There is no distension.     Palpations: Abdomen is soft. There is no mass.     Tenderness: There is no abdominal tenderness. There is no guarding or rebound.  Musculoskeletal:        General: No tenderness. Normal range of motion.     Cervical back: Normal range of motion and neck supple.  Skin:    General: Skin is warm.  Neurological:     Mental Status: He is alert and oriented to person, place, and time.     Comments: Left facial droop noted-post surgery.  Psychiatric:        Mood and Affect: Affect normal.      LABORATORY DATA:  I have reviewed the data as listed Lab Results  Component Value Date   WBC 7.2 05/25/2023   HGB 13.2 05/25/2023   HCT 41.6 05/25/2023   MCV 87.4 05/25/2023   PLT 291 05/25/2023   Recent Labs    10/23/22 1001 01/23/23 1011 05/25/23 1024  NA 139 135 137  K 4.2 3.5 4.1  CL 105 104 101  CO2 25 24 27   GLUCOSE 109* 170* 127*  BUN 25* 20 15  CREATININE 1.57* 1.06 1.14  CALCIUM 8.9 8.7* 9.6  GFRNONAA 44* >60 >60  PROT 6.6 6.6 7.0  ALBUMIN 3.6 3.5 3.8  AST 26 25 27   ALT 14 16 16   ALKPHOS 63 61 64  BILITOT 0.3 0.4 0.6    RADIOGRAPHIC STUDIES: I have personally reviewed the radiological images as listed and agreed with the  findings in the  report. No results found.  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 Stage: pT4aN3b-stage IVb.  HER-2 positive; androgen receptor positive.] Patient currently s/p adjuvant radiation-[finished July 16th. 2022].   # AUG-PET -recurrence in neck.  OCT 2024-solitary recurrence in the neck s/p biopsy.  Also status post excisional at [facility] [NOV 2024].  # Patient s/p evaluation at [facility] radiation oncology Dr. Pearlstein-currently awaiting radiation-from 23rd, DEC to 31st JAN 2025.  Discussed regarding use of adjuvant chemotherapy- [based on retrospective data from Lottie Dawson al oncologist, 2020; weekly carboplatinum Taxol Herceptin with radiation. [Protocol: carboplatin (area under the curve, 1.5) with paclitaxel (45 mg/m2 ) and trastuzumab (TCH; 2 mg/kg after a 6 mg/kg week 1 loading dose) intravenously concurrently with adjuvant radiotherapy (RT), then continue the adjuvant [facility] for up to 12 additional weeks post-RT, followed by a total of 1 year of maintenance trastuzumab every 21 days (6 mg/kg)].   # However patient will be getting radiation at [facility] location twice a day for 5 weeks.  Given the logistics-I would recommend evaluation with [facility] oncology-for consideration of above chemotherapy with radiation.  I discussed with Dr. Carles Collet, who is in agreement with the plan.  Also discussed with Dr. Augusto Garbe.   # Injection site pain -premedication with Emla cream. stable  # Lymphedema neck s/p surgery-s/p  lymphedema treatment/prevention- stable  *eligard q 6 M-last 01/23/2023 Usually 67m-fu  # DISPOSITION:  # follow up in  2  months- MD labs- cbc/cmp; Eligard-- Dr.B  # 40 minutes  face-to-face with the patient discussing the above plan of care; more than 50% of time spent on prognosis/ natural history; counseling and coordination.  Also Tried to Reach the Patient's Daughter, Cordelia Pen to reach left a voicemail with my above recommendations.      All questions were answered. The patient knows to call the clinic with any problems, questions or concerns.    Earna Coder, MD 05/25/2023 12:06 PM

## 2023-05-28 DIAGNOSIS — H40153 Residual stage of open-angle glaucoma, bilateral: Secondary | ICD-10-CM | POA: Diagnosis not present

## 2023-05-29 DIAGNOSIS — Z1159 Encounter for screening for other viral diseases: Secondary | ICD-10-CM | POA: Diagnosis not present

## 2023-05-29 DIAGNOSIS — C07 Malignant neoplasm of parotid gland: Secondary | ICD-10-CM | POA: Diagnosis not present

## 2023-05-31 DIAGNOSIS — Z1159 Encounter for screening for other viral diseases: Secondary | ICD-10-CM | POA: Diagnosis not present

## 2023-05-31 DIAGNOSIS — Z5111 Encounter for antineoplastic chemotherapy: Secondary | ICD-10-CM | POA: Diagnosis not present

## 2023-05-31 DIAGNOSIS — C07 Malignant neoplasm of parotid gland: Secondary | ICD-10-CM | POA: Diagnosis not present

## 2023-06-04 DIAGNOSIS — C07 Malignant neoplasm of parotid gland: Secondary | ICD-10-CM | POA: Diagnosis not present

## 2023-06-05 DIAGNOSIS — C07 Malignant neoplasm of parotid gland: Secondary | ICD-10-CM | POA: Diagnosis not present

## 2023-06-07 DIAGNOSIS — C07 Malignant neoplasm of parotid gland: Secondary | ICD-10-CM | POA: Diagnosis not present

## 2023-06-08 DIAGNOSIS — C07 Malignant neoplasm of parotid gland: Secondary | ICD-10-CM | POA: Diagnosis not present

## 2023-06-11 DIAGNOSIS — H0011 Chalazion right upper eyelid: Secondary | ICD-10-CM | POA: Diagnosis not present

## 2023-06-11 DIAGNOSIS — C07 Malignant neoplasm of parotid gland: Secondary | ICD-10-CM | POA: Diagnosis not present

## 2023-06-12 DIAGNOSIS — C07 Malignant neoplasm of parotid gland: Secondary | ICD-10-CM | POA: Diagnosis not present

## 2023-06-14 DIAGNOSIS — E1140 Type 2 diabetes mellitus with diabetic neuropathy, unspecified: Secondary | ICD-10-CM | POA: Diagnosis not present

## 2023-06-14 DIAGNOSIS — Z7984 Long term (current) use of oral hypoglycemic drugs: Secondary | ICD-10-CM | POA: Diagnosis not present

## 2023-06-14 DIAGNOSIS — C07 Malignant neoplasm of parotid gland: Secondary | ICD-10-CM | POA: Diagnosis not present

## 2023-06-14 DIAGNOSIS — Z510 Encounter for antineoplastic radiation therapy: Secondary | ICD-10-CM | POA: Diagnosis not present

## 2023-06-14 DIAGNOSIS — K5900 Constipation, unspecified: Secondary | ICD-10-CM | POA: Diagnosis not present

## 2023-06-14 DIAGNOSIS — I129 Hypertensive chronic kidney disease with stage 1 through stage 4 chronic kidney disease, or unspecified chronic kidney disease: Secondary | ICD-10-CM | POA: Diagnosis not present

## 2023-06-14 DIAGNOSIS — Z1159 Encounter for screening for other viral diseases: Secondary | ICD-10-CM | POA: Diagnosis not present

## 2023-06-14 DIAGNOSIS — Z792 Long term (current) use of antibiotics: Secondary | ICD-10-CM | POA: Diagnosis not present

## 2023-06-14 DIAGNOSIS — Z79899 Other long term (current) drug therapy: Secondary | ICD-10-CM | POA: Diagnosis not present

## 2023-06-14 DIAGNOSIS — N189 Chronic kidney disease, unspecified: Secondary | ICD-10-CM | POA: Diagnosis not present

## 2023-06-14 DIAGNOSIS — Z87891 Personal history of nicotine dependence: Secondary | ICD-10-CM | POA: Diagnosis not present

## 2023-06-14 DIAGNOSIS — I2510 Atherosclerotic heart disease of native coronary artery without angina pectoris: Secondary | ICD-10-CM | POA: Diagnosis not present

## 2023-06-14 DIAGNOSIS — E785 Hyperlipidemia, unspecified: Secondary | ICD-10-CM | POA: Diagnosis not present

## 2023-06-14 DIAGNOSIS — Z170 Estrogen receptor positive status [ER+]: Secondary | ICD-10-CM | POA: Diagnosis not present

## 2023-06-14 DIAGNOSIS — Z7982 Long term (current) use of aspirin: Secondary | ICD-10-CM | POA: Diagnosis not present

## 2023-06-14 DIAGNOSIS — E1122 Type 2 diabetes mellitus with diabetic chronic kidney disease: Secondary | ICD-10-CM | POA: Diagnosis not present

## 2023-06-14 DIAGNOSIS — Z5111 Encounter for antineoplastic chemotherapy: Secondary | ICD-10-CM | POA: Diagnosis not present

## 2023-06-14 DIAGNOSIS — C50922 Malignant neoplasm of unspecified site of left male breast: Secondary | ICD-10-CM | POA: Diagnosis not present

## 2023-06-15 DIAGNOSIS — C07 Malignant neoplasm of parotid gland: Secondary | ICD-10-CM | POA: Diagnosis not present

## 2023-06-19 DIAGNOSIS — C07 Malignant neoplasm of parotid gland: Secondary | ICD-10-CM | POA: Diagnosis not present

## 2023-06-20 DIAGNOSIS — C07 Malignant neoplasm of parotid gland: Secondary | ICD-10-CM | POA: Diagnosis not present

## 2023-06-21 ENCOUNTER — Other Ambulatory Visit: Payer: Self-pay | Admitting: Family Medicine

## 2023-06-21 DIAGNOSIS — C07 Malignant neoplasm of parotid gland: Secondary | ICD-10-CM | POA: Diagnosis not present

## 2023-06-22 DIAGNOSIS — C07 Malignant neoplasm of parotid gland: Secondary | ICD-10-CM | POA: Diagnosis not present

## 2023-06-25 DIAGNOSIS — C07 Malignant neoplasm of parotid gland: Secondary | ICD-10-CM | POA: Diagnosis not present

## 2023-06-26 DIAGNOSIS — C07 Malignant neoplasm of parotid gland: Secondary | ICD-10-CM | POA: Diagnosis not present

## 2023-06-27 DIAGNOSIS — C07 Malignant neoplasm of parotid gland: Secondary | ICD-10-CM | POA: Diagnosis not present

## 2023-06-28 DIAGNOSIS — C07 Malignant neoplasm of parotid gland: Secondary | ICD-10-CM | POA: Diagnosis not present

## 2023-06-29 DIAGNOSIS — C07 Malignant neoplasm of parotid gland: Secondary | ICD-10-CM | POA: Diagnosis not present

## 2023-07-03 DIAGNOSIS — C07 Malignant neoplasm of parotid gland: Secondary | ICD-10-CM | POA: Diagnosis not present

## 2023-07-04 DIAGNOSIS — C07 Malignant neoplasm of parotid gland: Secondary | ICD-10-CM | POA: Diagnosis not present

## 2023-07-05 DIAGNOSIS — C07 Malignant neoplasm of parotid gland: Secondary | ICD-10-CM | POA: Diagnosis not present

## 2023-07-06 DIAGNOSIS — C07 Malignant neoplasm of parotid gland: Secondary | ICD-10-CM | POA: Diagnosis not present

## 2023-07-09 DIAGNOSIS — C07 Malignant neoplasm of parotid gland: Secondary | ICD-10-CM | POA: Diagnosis not present

## 2023-07-10 DIAGNOSIS — C07 Malignant neoplasm of parotid gland: Secondary | ICD-10-CM | POA: Diagnosis not present

## 2023-07-11 DIAGNOSIS — C07 Malignant neoplasm of parotid gland: Secondary | ICD-10-CM | POA: Diagnosis not present

## 2023-07-12 DIAGNOSIS — C07 Malignant neoplasm of parotid gland: Secondary | ICD-10-CM | POA: Diagnosis not present

## 2023-07-13 DIAGNOSIS — C07 Malignant neoplasm of parotid gland: Secondary | ICD-10-CM | POA: Diagnosis not present

## 2023-07-16 DIAGNOSIS — Z713 Dietary counseling and surveillance: Secondary | ICD-10-CM | POA: Diagnosis not present

## 2023-07-16 DIAGNOSIS — C07 Malignant neoplasm of parotid gland: Secondary | ICD-10-CM | POA: Diagnosis not present

## 2023-07-16 DIAGNOSIS — E1122 Type 2 diabetes mellitus with diabetic chronic kidney disease: Secondary | ICD-10-CM | POA: Diagnosis not present

## 2023-07-16 DIAGNOSIS — N189 Chronic kidney disease, unspecified: Secondary | ICD-10-CM | POA: Diagnosis not present

## 2023-07-16 DIAGNOSIS — I129 Hypertensive chronic kidney disease with stage 1 through stage 4 chronic kidney disease, or unspecified chronic kidney disease: Secondary | ICD-10-CM | POA: Diagnosis not present

## 2023-07-18 DIAGNOSIS — C07 Malignant neoplasm of parotid gland: Secondary | ICD-10-CM | POA: Diagnosis not present

## 2023-07-19 ENCOUNTER — Telehealth: Payer: Self-pay | Admitting: Internal Medicine

## 2023-07-19 DIAGNOSIS — H40153 Residual stage of open-angle glaucoma, bilateral: Secondary | ICD-10-CM | POA: Diagnosis not present

## 2023-07-19 NOTE — Telephone Encounter (Signed)
 Patient called to cancel upcoming 2/13 appointments he said he is going to [facility] for treatments.  Appointments cancelled as requested

## 2023-07-26 ENCOUNTER — Ambulatory Visit: Payer: PPO

## 2023-07-26 ENCOUNTER — Other Ambulatory Visit: Payer: PPO

## 2023-07-26 ENCOUNTER — Ambulatory Visit: Payer: PPO | Admitting: Internal Medicine

## 2023-08-03 ENCOUNTER — Ambulatory Visit: Payer: PPO | Admitting: Internal Medicine

## 2023-08-08 DIAGNOSIS — C07 Malignant neoplasm of parotid gland: Secondary | ICD-10-CM | POA: Diagnosis not present

## 2023-08-09 DIAGNOSIS — C07 Malignant neoplasm of parotid gland: Secondary | ICD-10-CM | POA: Diagnosis not present

## 2023-08-09 DIAGNOSIS — I129 Hypertensive chronic kidney disease with stage 1 through stage 4 chronic kidney disease, or unspecified chronic kidney disease: Secondary | ICD-10-CM | POA: Diagnosis not present

## 2023-08-09 DIAGNOSIS — N189 Chronic kidney disease, unspecified: Secondary | ICD-10-CM | POA: Diagnosis not present

## 2023-08-09 DIAGNOSIS — Z713 Dietary counseling and surveillance: Secondary | ICD-10-CM | POA: Diagnosis not present

## 2023-08-09 DIAGNOSIS — E1122 Type 2 diabetes mellitus with diabetic chronic kidney disease: Secondary | ICD-10-CM | POA: Diagnosis not present

## 2023-08-15 ENCOUNTER — Ambulatory Visit: Payer: PPO

## 2023-08-31 DIAGNOSIS — D2272 Melanocytic nevi of left lower limb, including hip: Secondary | ICD-10-CM | POA: Diagnosis not present

## 2023-08-31 DIAGNOSIS — D2262 Melanocytic nevi of left upper limb, including shoulder: Secondary | ICD-10-CM | POA: Diagnosis not present

## 2023-08-31 DIAGNOSIS — D225 Melanocytic nevi of trunk: Secondary | ICD-10-CM | POA: Diagnosis not present

## 2023-08-31 DIAGNOSIS — D2261 Melanocytic nevi of right upper limb, including shoulder: Secondary | ICD-10-CM | POA: Diagnosis not present

## 2023-08-31 DIAGNOSIS — L988 Other specified disorders of the skin and subcutaneous tissue: Secondary | ICD-10-CM | POA: Diagnosis not present

## 2023-08-31 DIAGNOSIS — Z85828 Personal history of other malignant neoplasm of skin: Secondary | ICD-10-CM | POA: Diagnosis not present

## 2023-09-11 ENCOUNTER — Ambulatory Visit: Attending: Internal Medicine

## 2023-09-11 DIAGNOSIS — I6523 Occlusion and stenosis of bilateral carotid arteries: Secondary | ICD-10-CM

## 2023-09-17 ENCOUNTER — Encounter: Payer: Self-pay | Admitting: Family Medicine

## 2023-09-26 NOTE — Telephone Encounter (Signed)
 Sent message to let patient know will be addressed once you are back in office. Pended freestyle script if ok to send in.

## 2023-09-27 MED ORDER — FREESTYLE LIBRE 3 PLUS SENSOR MISC: 1 refills | Status: DC

## 2023-10-03 DIAGNOSIS — C089 Malignant neoplasm of major salivary gland, unspecified: Secondary | ICD-10-CM | POA: Diagnosis not present

## 2023-10-03 DIAGNOSIS — C07 Malignant neoplasm of parotid gland: Secondary | ICD-10-CM | POA: Diagnosis not present

## 2023-10-03 DIAGNOSIS — H0223B Paralytic lagophthalmos left eye, upper and lower eyelids: Secondary | ICD-10-CM | POA: Diagnosis not present

## 2023-10-08 ENCOUNTER — Encounter: Payer: Self-pay | Admitting: Internal Medicine

## 2023-10-10 ENCOUNTER — Encounter: Payer: Self-pay | Admitting: Nurse Practitioner

## 2023-10-10 ENCOUNTER — Ambulatory Visit: Payer: PPO | Admitting: Internal Medicine

## 2023-10-10 ENCOUNTER — Ambulatory Visit: Attending: Nurse Practitioner | Admitting: Nurse Practitioner

## 2023-10-10 VITALS — BP 122/72 | HR 66 | Ht 70.0 in | Wt 190.8 lb

## 2023-10-10 DIAGNOSIS — I2510 Atherosclerotic heart disease of native coronary artery without angina pectoris: Secondary | ICD-10-CM

## 2023-10-10 DIAGNOSIS — I350 Nonrheumatic aortic (valve) stenosis: Secondary | ICD-10-CM | POA: Diagnosis not present

## 2023-10-10 DIAGNOSIS — E785 Hyperlipidemia, unspecified: Secondary | ICD-10-CM

## 2023-10-10 DIAGNOSIS — I10 Essential (primary) hypertension: Secondary | ICD-10-CM | POA: Diagnosis not present

## 2023-10-10 DIAGNOSIS — E1149 Type 2 diabetes mellitus with other diabetic neurological complication: Secondary | ICD-10-CM

## 2023-10-10 MED ORDER — NITROGLYCERIN 0.4 MG SL SUBL: 0.4000 mg | SUBLINGUAL_TABLET | SUBLINGUAL | 99 refills | Status: AC | PRN

## 2023-10-10 NOTE — Patient Instructions (Signed)
 Medication Instructions:  No changes *If you need a refill on your cardiac medications before your next appointment, please call your pharmacy*  Lab Work: None ordered If you have labs (blood work) drawn today and your tests are completely normal, you will receive your results only by: MyChart Message (if you have MyChart) OR A paper copy in the mail If you have any lab test that is abnormal or we need to change your treatment, we will call you to review the results.  Testing/Procedures: None ordered  Follow-Up: At [facility], you and your health needs are our priority.  As part of our continuing mission to provide you with exceptional heart care, our providers are all part of one team.  This team includes your primary Cardiologist (physician) and Advanced Practice Providers or APPs (Physician Assistants and Nurse Practitioners) who all work together to provide you with the care you need, when you need it.  Your next appointment:   12 month(s)  Provider:   You may see Sammy Crisp, MD or one of the following Advanced Practice Providers on your designated Care Team:   Laneta Pintos, NP  We recommend signing up for the patient portal called "MyChart".  Sign up information is provided on this After Visit Summary.  MyChart is used to connect with patients for Virtual Visits (Telemedicine).  Patients are able to view lab/test results, encounter notes, upcoming appointments, etc.  Non-urgent messages can be sent to your provider as well.   To learn more about what you can do with MyChart, go to [email or web address].

## 2023-10-10 NOTE — Progress Notes (Signed)
 Office Visit    Patient Name: Miguel Bradley Date of Encounter: 10/10/2023  Primary Care Provider:  Judithann Novas, MD Primary Cardiologist:  Sammy Crisp, MD  Chief Complaint    83 y.o. 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  Past Medical History  Subjective   Past Medical History:  Diagnosis Date   Anemia    Aortic stenosis (mild)    a. 08/2021 Echo: Mild AS (mean grd 11.65mmHg). AVA 2.10m/s Vmax.   Basal cell carcinoma of face    CAD (coronary artery disease)    a. 02/2020 Cor CTA: RCA dzs noted; b. 03/2020 Cath: LM nl, LAD 81m, D2 80, RI min irregs, LCX nl, OM1/2/3 nl (small), RCA 85d-->Med rx.   Cancer of neck (HCC)    Cancer of parotid gland (HCC) 08/2020   s/p radical left parotidectomy and neck dissection and radiation   Carotid arterial disease (HCC)    a. 09/2023 Carotid U/S: RICA 1-39%, LICA 100 CTO.   Cataract    Diabetes mellitus without complication (HCC)    Diastolic dysfunction    a. 08/2021 Echo: EF 55-60%, no rwma, mod LVH, GrI DD, nl RV fxn, mild-mod dil LA, mild AS.   Erectile dysfunction 06/11/2014   Glaucoma    Hypercalcemia    Hyperlipidemia LDL goal <70    Hyperparathyroidism, unspecified (HCC)    Impotence of organic origin    Other testicular hypofunction    Pure hyperglyceridemia    Special screening for malignant neoplasm of prostate    Special screening for malignant neoplasms, colon    Unspecified essential hypertension    Past Surgical History:  Procedure Laterality Date   COLONOSCOPY  06/13/2018   Dr. Sallyann Crea   LEFT HEART CATH AND CORONARY ANGIOGRAPHY Left 03/16/2020   Procedure: LEFT HEART CATH AND CORONARY ANGIOGRAPHY;   Surgeon: Sammy Crisp, MD;  Location: [facility] INVASIVE CV LAB;  Service: Cardiovascular;  Laterality: Left;   PARATHYROIDECTOMY  2012   PAROTIDECTOMY W/ NECK DISSECTION TOTAL Left 08/2020   [facility]   skin cancer removal     Dr. Adan Holms   TRIGGER FINGER RELEASE Left 2008    Allergies  No Known Allergies    History of Present Illness      83 y.o. 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  He was previously evaluated in the fall 2021 in the setting of reduced exercise tolerance and left bundle branch block.  Echocardiogram showed normal LV function with grade 1 diastolic dysfunction and mild AS.  He subsequently underwent coronary CT angiogram, which suggested a total occlusion of the mid right coronary artery.  This was followed by diagnostic catheterization in 03/2020, which showed an 85% stenosis distal RCA, 80% stenosis in the second diagonal, otherwise mild to mild nonobstructive CAD.  He was managed medically.  Most recent echo in March 2023 showed an EF of 55 to 60% with grade 1 diastolic dysfunction, mildly to moderately dilated left atrium, and mild aortic stenosis.   Miguel Bradley was last seen in cardiology clinic in January 2024,  at which time he was doing well.  Recent carotid ultrasound in April 2025 continues to show chronic total occlusion of the left with 1 to 39% right internal carotid artery stenosis.  Over the past year, Miguel Bradley says that he is done well from a cardiac standpoint.  He did require repeat left neck surgery in October 2020 for followed by chemo and radiation.  Following his treatment, he was left feeling tired but he is slowly increasing his stamina and is now playing golf once every 1 to 2 weeks.   He is mostly riding the course but does do some walking.  He does not experience chest pain or dyspnea.  He also recently sold his home and is now living in a record time and community.  He is getting used to this big life change and learning to enjoy it.  He is not routinely exercising during the day.  He denies palpitations, PND, orthopnea, dizziness, syncope, edema, or early satiety. Objective  Home Medications    Current Outpatient Medications  Medication Sig Dispense Refill   aspirin  EC 81 MG tablet Take 81 mg by mouth daily.     dorzolamide-timolol (COSOPT) 22.3-6.8 MG/ML ophthalmic solution Place 1 drop into both eyes 2 (two) times daily.      fenofibrate  160 MG tablet TAKE 1 TABLET BY MOUTH EVERY DAY 90 tablet 3   hydrochlorothiazide  (HYDRODIURIL ) 25 MG tablet TAKE 1 TABLET BY MOUTH EVERY DAY 90 tablet 3   latanoprost (XALATAN) 0.005 % ophthalmic solution Place 1 drop into both eyes at bedtime.      metFORMIN  (GLUCOPHAGE ) 500 MG tablet TAKE ONE TABLET BY MOUTH TWICE DAILY WITH A MEAL 180 tablet 1   Multiple Vitamin (MULTIVITAMIN) tablet Take 1 tablet by mouth daily.     Omega-3 Fatty Acids (FISH OIL) 1000 MG CAPS Take 1 capsule by mouth daily.     rosuvastatin  (CRESTOR ) 20 MG tablet TAKE 1 TABLET BY MOUTH EVERY DAY 90 tablet 1   Continuous Glucose Sensor (FREESTYLE LIBRE 3 PLUS SENSOR) MISC Use to check blood sugar continuously. Change sensor every 15 days. 6 each 1   nitroGLYCERIN  (NITROSTAT ) 0.4 MG SL tablet Place 1 tablet (0.4 mg total) under the tongue every 5 (five) minutes as needed for chest pain. 25 tablet prn   No current facility-administered medications for this visit.     Physical Exam    VS:  BP 122/72   Pulse 66   Ht 5\' 10"  (1.778 m)   Wt 190 lb 12.8 oz (86.5 kg)   SpO2 98%   BMI 27.38 kg/m  , BMI Body mass index is 27.38 kg/m.     Vitals:   10/10/23 0901 10/10/23 0941  BP: (!) 142/62 122/72  Pulse: 66   SpO2: 98%       GEN: Well nourished, well developed,  in no acute distress. HEENT: Postsurgical facial changes on the left. Neck: Supple, no JVD, carotid bruits, or masses. Cardiac: RRR, 2/6 systolic murmur loudest at the upper sternal borders, heard throughout.  No rubs or gallops. No clubbing, cyanosis, edema.  Radials 2+/PT 2+ and equal bilaterally.  Respiratory:  Respirations regular and unlabored, clear to auscultation bilaterally. GI: Soft, nontender, nondistended, BS + x 4. MS: no deformity or atrophy. Skin: warm and dry, no rash. Neuro:  Strength and sensation are intact. Psych: Normal affect.  Accessory Clinical Findings    ECG personally reviewed by me today - EKG Interpretation Date/Time:  Wednesday October 10 2023 09:05:12  EDT Ventricular Rate:  66 PR Interval:  180 QRS Duration:  126 QT Interval:  456 QTC Calculation: 478 R Axis:   -50  Text Interpretation: Normal sinus rhythm Left axis deviation Non-specific intra-ventricular conduction block Confirmed by Laneta Pintos [phone]) on 10/10/2023 9:19:36 AM   - no acute changes.  Lab Results  Component Value Date   WBC 7.2 05/25/2023   HGB 13.2 05/25/2023   HCT 41.6 05/25/2023   MCV 87.4 05/25/2023   PLT 291 05/25/2023   Lab Results  Component Value Date   CREATININE 1.14 05/25/2023   BUN 15 05/25/2023   NA 137 05/25/2023   K 4.1 05/25/2023   CL 101 05/25/2023   CO2 27 05/25/2023   Lab Results  Component Value Date   ALT 16 05/25/2023   AST 27 05/25/2023   ALKPHOS 64 05/25/2023   BILITOT 0.6 05/25/2023   Lab Results  Component Value Date   CHOL 120 11/07/2022   HDL 40.70 11/07/2022   LDLCALC 45 11/07/2022   LDLDIRECT 70.0 12/31/2020   TRIG 171.0 (H) 11/07/2022   CHOLHDL 3 11/07/2022    Lab Results  Component Value Date   HGBA1C 6.1 04/06/2023   Lab Results  Component Value Date   TSH 0.83 11/14/2022    Labs dated July 12, 2023 from [facility]:  Sodium 141, potassium 3.7, chloride 100, CO2 26.9, BUN 18, creatinine 0.95, glucose  147 Calcium  9.7, albumin 3.2, total protein 7 Total bilirubin 0.4, alkaline phosphatase 87, AST 27, ALT 20 Hemoglobin 13.2, hematocrit 39.9, WBC 7.6, platelets 241    Assessment & Plan    1.  Coronary artery disease: Status post diagnostic catheterization in October 2021 showing 80% second diagonal and 85% distal RCA stenoses.  He has been medically managed and has done well without chest pain or dyspnea.  He is playing golf once every 1 to 2 weeks.  Though he rides the course, he tolerates this reasonably well.  He remains on aspirin , statin, fibrate, and fish oil.  2.  Mild aortic stenosis: Last echo in March 2023 showed mild aortic stenosis with a mean gradient of 11.5 mmHg.  Systolic murmur noted on exam.  Asymptomatic.  Likely plan to follow-up echocardiogram next year.  3.  Primary hypertension: Initial blood pressure recording was elevated though improved on repeat at 122/72.  He remains on HCTZ therapy.  Normal renal function and electrolytes by lab work in January of this year.  4.  Hyperlipidemia: LDL of 45 and triglycerides of 171 in May of last year with normal LFTs in January.  He remains on rosuvastatin , fibrate, and fish oil therapy.  5.  Carotid arterial disease:  Known CTO of LICA w/ 1-39% RICA stenosis - stable.  Asymptomatic.  Cont asa/lipid mgmt.  6.  DMII:  on metformin .  A1c 6.1 in 03/2023.  7.  Disposition:  f/u 1 year or sooner if necessary.  Laneta Pintos, NP 10/10/2023, 9:41 AM

## 2023-10-16 DIAGNOSIS — Z1732 Human epidermal growth factor receptor 2 negative status: Secondary | ICD-10-CM | POA: Diagnosis not present

## 2023-10-16 DIAGNOSIS — Z923 Personal history of irradiation: Secondary | ICD-10-CM | POA: Diagnosis not present

## 2023-10-16 DIAGNOSIS — I2510 Atherosclerotic heart disease of native coronary artery without angina pectoris: Secondary | ICD-10-CM | POA: Diagnosis not present

## 2023-10-16 DIAGNOSIS — K5900 Constipation, unspecified: Secondary | ICD-10-CM | POA: Diagnosis not present

## 2023-10-16 DIAGNOSIS — I129 Hypertensive chronic kidney disease with stage 1 through stage 4 chronic kidney disease, or unspecified chronic kidney disease: Secondary | ICD-10-CM | POA: Diagnosis not present

## 2023-10-16 DIAGNOSIS — N189 Chronic kidney disease, unspecified: Secondary | ICD-10-CM | POA: Diagnosis not present

## 2023-10-16 DIAGNOSIS — Z7982 Long term (current) use of aspirin: Secondary | ICD-10-CM | POA: Diagnosis not present

## 2023-10-16 DIAGNOSIS — E785 Hyperlipidemia, unspecified: Secondary | ICD-10-CM | POA: Diagnosis not present

## 2023-10-16 DIAGNOSIS — E1140 Type 2 diabetes mellitus with diabetic neuropathy, unspecified: Secondary | ICD-10-CM | POA: Diagnosis not present

## 2023-10-16 DIAGNOSIS — C07 Malignant neoplasm of parotid gland: Secondary | ICD-10-CM | POA: Diagnosis not present

## 2023-10-16 DIAGNOSIS — Z8589 Personal history of malignant neoplasm of other organs and systems: Secondary | ICD-10-CM | POA: Diagnosis not present

## 2023-10-16 DIAGNOSIS — Z08 Encounter for follow-up examination after completed treatment for malignant neoplasm: Secondary | ICD-10-CM | POA: Diagnosis not present

## 2023-10-16 DIAGNOSIS — E1122 Type 2 diabetes mellitus with diabetic chronic kidney disease: Secondary | ICD-10-CM | POA: Diagnosis not present

## 2023-10-16 DIAGNOSIS — Z7984 Long term (current) use of oral hypoglycemic drugs: Secondary | ICD-10-CM | POA: Diagnosis not present

## 2023-10-26 ENCOUNTER — Telehealth: Payer: Self-pay | Admitting: *Deleted

## 2023-10-26 DIAGNOSIS — E1149 Type 2 diabetes mellitus with other diabetic neurological complication: Secondary | ICD-10-CM

## 2023-10-26 DIAGNOSIS — E1169 Type 2 diabetes mellitus with other specified complication: Secondary | ICD-10-CM

## 2023-10-26 NOTE — Telephone Encounter (Signed)
-----   Message from Gerry Krone sent at 10/26/2023  3:50 PM EDT ----- Regarding: Lab orders for, Miguel Bradley, [birth year] Patient is scheduled for CPX labs, please order future labs, Thanks , Anselmo Kings

## 2023-10-27 ENCOUNTER — Other Ambulatory Visit: Payer: Self-pay | Admitting: Family Medicine

## 2023-11-08 ENCOUNTER — Ambulatory Visit: Payer: Self-pay | Admitting: Family Medicine

## 2023-11-08 ENCOUNTER — Other Ambulatory Visit: Payer: PPO

## 2023-11-08 DIAGNOSIS — E1169 Type 2 diabetes mellitus with other specified complication: Secondary | ICD-10-CM

## 2023-11-08 DIAGNOSIS — E785 Hyperlipidemia, unspecified: Secondary | ICD-10-CM | POA: Diagnosis not present

## 2023-11-08 DIAGNOSIS — E1149 Type 2 diabetes mellitus with other diabetic neurological complication: Secondary | ICD-10-CM

## 2023-11-08 LAB — VITAMIN B12: Vitamin B-12: 879 pg/mL (ref 211–911)

## 2023-11-08 LAB — LIPID PANEL
Cholesterol: 149 mg/dL (ref 0–200)
HDL: 40.8 mg/dL (ref >39.00)
LDL Cholesterol: 58 mg/dL (ref 0–99)
NonHDL: 108.4
Total CHOL/HDL Ratio: 4
Triglycerides: 253 mg/dL — ABNORMAL HIGH (ref 0.0–149.0)
VLDL: 50.6 mg/dL — ABNORMAL HIGH (ref 0.0–40.0)

## 2023-11-08 LAB — HEMOGLOBIN A1C: Hgb A1c MFr Bld: 6.5 % (ref 4.6–6.5)

## 2023-11-08 LAB — MICROALBUMIN / CREATININE URINE RATIO
Creatinine,U: 59.7 mg/dL
Microalb Creat Ratio: UNDETERMINED mg/g (ref 0.0–30.0)
Microalb, Ur: <0.7 mg/dL

## 2023-11-08 NOTE — Progress Notes (Signed)
 No critical labs need to be addressed urgently. We will discuss labs in detail at upcoming office visit.

## 2023-11-15 ENCOUNTER — Ambulatory Visit: Payer: PPO | Admitting: Family Medicine

## 2023-11-15 ENCOUNTER — Encounter: Payer: Self-pay | Admitting: Family Medicine

## 2023-11-15 VITALS — BP 120/60 | HR 50 | Temp 97.8°F | Ht 69.0 in | Wt 191.4 lb

## 2023-11-15 DIAGNOSIS — E1149 Type 2 diabetes mellitus with other diabetic neurological complication: Secondary | ICD-10-CM

## 2023-11-15 DIAGNOSIS — E1169 Type 2 diabetes mellitus with other specified complication: Secondary | ICD-10-CM

## 2023-11-15 DIAGNOSIS — E1142 Type 2 diabetes mellitus with diabetic polyneuropathy: Secondary | ICD-10-CM

## 2023-11-15 DIAGNOSIS — Z0000 Encounter for general adult medical examination without abnormal findings: Secondary | ICD-10-CM

## 2023-11-15 DIAGNOSIS — Z1211 Encounter for screening for malignant neoplasm of colon: Secondary | ICD-10-CM | POA: Diagnosis not present

## 2023-11-15 DIAGNOSIS — C07 Malignant neoplasm of parotid gland: Secondary | ICD-10-CM

## 2023-11-15 DIAGNOSIS — E785 Hyperlipidemia, unspecified: Secondary | ICD-10-CM | POA: Diagnosis not present

## 2023-11-15 DIAGNOSIS — Z7984 Long term (current) use of oral hypoglycemic drugs: Secondary | ICD-10-CM | POA: Diagnosis not present

## 2023-11-15 DIAGNOSIS — I10 Essential (primary) hypertension: Secondary | ICD-10-CM

## 2023-11-15 LAB — HM DIABETES FOOT EXAM

## 2023-11-15 MED ORDER — METFORMIN HCL ER 500 MG PO TB24
500.0000 mg | ORAL_TABLET | Freq: Every day | ORAL | 11 refills | Status: AC
Start: 2023-11-15 — End: ?

## 2023-11-15 NOTE — Patient Instructions (Addendum)
 Let me know if you want to switch metformin  to a  heart protective medication such as Jardiance or Farxiga.  Cut back on sweets.  Can change metformin  for now to be long acting once daily 500 mg .  Look for a call about colonoscopy.

## 2023-11-15 NOTE — Assessment & Plan Note (Signed)
 LEFT parotid gland carcinoma s/p resection/LND followed by adjuvant RT in 2022 (70Gy) now with LEFT neck recurrence in level IV s/p neck dissection  03/2023(2.3cm, invades jugular vein, positive soft tissue margin) .  Reviewed last OV 05/08/2023 from [facility] Dr. Ruthy Cox.. Rad ONC... adjuvant radiation x 5 weeks

## 2023-11-15 NOTE — Assessment & Plan Note (Signed)
Stable, chronic.  Continue current medication.  LDL at goal less than 70  Crestor 20 mg daily  fenofibrate 160 mg daily 

## 2023-11-15 NOTE — Assessment & Plan Note (Addendum)
 Stable, chronic.  Continue current medication.  Discussed possibly change metformin  to Jardiance given cardiovascular benefit.  He will discuss with his daughter and let me know.  Metformin  500 mg  (changed to XR) for compliance daily

## 2023-11-15 NOTE — Progress Notes (Signed)
 Patient ID: Miguel Bradley, male    DOB: [birth year], 83 y.o.   MRN: [id number]  This visit was conducted in person.  BP 120/60   Pulse (!) 50   Temp 97.8 F (36.6 C) (Temporal)   Ht 5\' 9"  (1.753 m)   Wt 191 lb 6 oz (86.8 kg)   SpO2 96%   BMI 28.26 kg/m    CC: Chief Complaint  Patient presents with   Medicare Wellness    Subjective:   HPI: Miguel Bradley is a 83 y.o. male presenting on 11/15/2023 for Medicare Wellness  The patient presents for annual medicare wellness, complete physical and review of chronic health problems. He/She also has the following acute concerns today: none  I have personally reviewed the Medicare Annual Wellness questionnaire and have noted 1. The patient's medical and social history 2. Their use of alcohol, tobacco or illicit drugs 3. Their current medications and supplements 4. The patient's functional ability including ADL's, fall risks, home safety risks and hearing or visual             impairment. 5. Diet and physical activities 6. Evidence for depression or mood disorders 7.         Updated provider list Cognitive evaluation was performed and recorded on pt medicare questionnaire form. The patients weight, height, BMI and visual acuity have been recorded in the chart   I have made referrals, counseling and provided education to the patient based review of the above and I have provided the pt with a written personalized care plan for preventive services.   Documentation of this information was scanned into the electronic record under the media tab.  No falls in last 12 months.  Cardiac CTA showed multivessel CAD, including FFR positive LAD disease and chronic total occlusion of the mid RCA.  He underwent cardiac catheterization in 03/2020 that showed 80 to 90% distal RCA stenosis and nonobstructive disease in the left coronary artery.  Given that he was totally asymptomatic, they opted to continue with medical therapy.  Followed by cardiology.    Cancer of parotid, S/P resection, current on eliguard  Reviewed last OV Oncology Dr.  Doyne Genin 10/16/2023  Unfortunately, PET 01/29/23 showed left neck level 4 site of recurrent disease. LN biopsy 04/05/23 showed metastatic high grade carcinoma, c/w known salivary gland carcinoma. Patient met with radiation oncology on 04/28/23 for evaluation ...reirradiation starting in 06/04/23.    RUL lung nodule- 0.5 cm [PET FEB 2024; [facility]]; not present on PET in April, 2023- awaiting on PET scan from 5/15.    Elevated Cholesterol:  LDL at goal < 70 on crestor  and fenofibrate  Lab Results  Component Value Date   CHOL 149 11/08/2023   HDL 40.80 11/08/2023   LDLCALC 58 11/08/2023   LDLDIRECT 70.0 12/31/2020   TRIG 253.0 (H) 11/08/2023   CHOLHDL 4 11/08/2023  Using medications without problems: Muscle aches:  Diet compliance: good Exercise: playing golf again. Other complaints:  Carotid stenosis, mild aortic stenosis: last doppler per cardiology Last OV 10/10/2023 reviewed Plan ECHO 2026    Hypertension:    At goal on HCTZ 25 mg daily BP Readings from Last 3 Encounters:  11/15/23 120/60  10/10/23 122/72  05/25/23 139/68  Using medication without problems or lightheadedness:  none Chest pain with exertion: none Edema:none Short of breath: none Average home BPs: Other issues:  Diabetes:   Improved with weight loss, on metformin  500 mg BID... frequently taking 2 a day. Lab Results  Component Value Date   HGBA1C 6.5 11/08/2023  Using medications without difficulties: Hypoglycemic episodes: Hyperglycemic episodes: Feet problems: none Blood Sugars averaging: not checking eye exam within last year: yes     Wt Readings from Last 3 Encounters:  11/15/23 191 lb 6 oz (86.8 kg)  10/10/23 190 lb 12.8 oz (86.5 kg)  05/25/23 199 lb 9.6 oz (90.5 kg)   Drinking more water and less soft drinks.  GFR decreased on 10/23/2022 visits.    MMSE 30/30 in office today.  Notes some  memory issues when  trying to recall names  of things.  Relevant past medical, surgical, family and social history reviewed and updated as indicated. Interim medical history since our last visit reviewed. Allergies and medications reviewed and updated. Outpatient Medications Prior to Visit  Medication Sig Dispense Refill   aspirin  EC 81 MG tablet Take 81 mg by mouth daily.     Continuous Glucose Sensor (FREESTYLE LIBRE 3 PLUS SENSOR) MISC Use to check blood sugar continuously. Change sensor every 15 days. 6 each 1   dorzolamide-timolol (COSOPT) 22.3-6.8 MG/ML ophthalmic solution Place 1 drop into both eyes 2 (two) times daily.      fenofibrate  160 MG tablet TAKE 1 TABLET BY MOUTH EVERY DAY 90 tablet 3   hydrochlorothiazide  (HYDRODIURIL ) 25 MG tablet TAKE 1 TABLET BY MOUTH EVERY DAY 90 tablet 3   latanoprost (XALATAN) 0.005 % ophthalmic solution Place 1 drop into both eyes at bedtime.      Multiple Vitamin (MULTIVITAMIN) tablet Take 1 tablet by mouth daily.     nitroGLYCERIN  (NITROSTAT ) 0.4 MG SL tablet Place 1 tablet (0.4 mg total) under the tongue every 5 (five) minutes as needed for chest pain. 25 tablet prn   Omega-3 Fatty Acids (FISH OIL) 1000 MG CAPS Take 1 capsule by mouth daily.     rosuvastatin  (CRESTOR ) 20 MG tablet TAKE 1 TABLET BY MOUTH EVERY DAY 90 tablet 1   metFORMIN  (GLUCOPHAGE ) 500 MG tablet TAKE ONE TABLET BY MOUTH TWICE DAILY WITH A MEAL 180 tablet 0   No facility-administered medications prior to visit.     Per HPI unless specifically indicated in ROS section below Review of Systems  Constitutional:  Negative for fatigue and fever.  HENT:  Negative for ear pain.   Eyes:  Negative for pain.  Respiratory:  Negative for cough and shortness of breath.   Cardiovascular:  Negative for chest pain, palpitations and leg swelling.  Gastrointestinal:  Negative for abdominal pain.  Genitourinary:  Negative for dysuria.  Musculoskeletal:  Negative for arthralgias.  Neurological:  Negative for  syncope, light-headedness and headaches.  Psychiatric/Behavioral:  Negative for dysphoric mood.    Objective:  BP 120/60   Pulse (!) 50   Temp 97.8 F (36.6 C) (Temporal)   Ht 5\' 9"  (1.753 m)   Wt 191 lb 6 oz (86.8 kg)   SpO2 96%   BMI 28.26 kg/m   Wt Readings from Last 3 Encounters:  11/15/23 191 lb 6 oz (86.8 kg)  10/10/23 190 lb 12.8 oz (86.5 kg)  05/25/23 199 lb 9.6 oz (90.5 kg)      Physical Exam Constitutional:      General: He is not in acute distress.    Appearance: Normal appearance. He is well-developed. He is not ill-appearing or toxic-appearing.  HENT:     Head: Normocephalic and atraumatic.     Comments: Post surgical changes to left side of face.. cannot fully close left eye, lip droop and some  trouble breathing though ;eft nostril    Right Ear: Hearing, tympanic membrane, ear canal and external ear normal.     Left Ear: Hearing, tympanic membrane, ear canal and external ear normal.     Nose: Nose normal.     Mouth/Throat:     Pharynx: Uvula midline.  Eyes:     General: Lids are normal. Lids are everted, no foreign bodies appreciated.     Conjunctiva/sclera: Conjunctivae normal.     Pupils: Pupils are equal, round, and reactive to light.  Neck:     Thyroid : No thyroid  mass or thyromegaly.     Vascular: No carotid bruit.     Trachea: Trachea and phonation normal.     Comments: Abnormal musculature of left neck from surgical changes. Cardiovascular:     Rate and Rhythm: Normal rate and regular rhythm.     Pulses: Normal pulses.     Heart sounds: S1 normal and S2 normal. Heart sounds not distant. No murmur heard.    No friction rub. No gallop.     Comments: No peripheral edema Pulmonary:     Effort: Pulmonary effort is normal. No respiratory distress.     Breath sounds: Normal breath sounds. No wheezing, rhonchi or rales.  Abdominal:     General: Bowel sounds are normal.     Palpations: Abdomen is soft.     Tenderness: There is no abdominal tenderness.  There is no guarding or rebound.     Hernia: No hernia is present.  Musculoskeletal:     Cervical back: Normal range of motion and neck supple.  Lymphadenopathy:     Cervical: No cervical adenopathy.  Skin:    General: Skin is warm and dry.     Findings: No rash.  Neurological:     Mental Status: He is alert.     Cranial Nerves: No cranial nerve deficit.     Sensory: No sensory deficit.     Gait: Gait normal.     Deep Tendon Reflexes: Reflexes are normal and symmetric.  Psychiatric:        Speech: Speech normal.        Behavior: Behavior normal.        Thought Content: Thought content normal.        Judgment: Judgment normal.    Diabetic foot exam: Normal inspection No skin breakdown No calluses  Normal DP pulses.. not diminished... no claudication symptoms. Normal sensation to light touch and monofilament Nails normal     Results for orders placed or performed in visit on 11/15/23  HM DIABETES FOOT EXAM   Collection Time: 11/15/23 12:00 AM  Result Value Ref Range   HM Diabetic Foot Exam done      COVID 19 screen:  No recent travel or known exposure to COVID19 The patient denies respiratory symptoms of COVID 19 at this time. The importance of social distancing was discussed today.   Assessment and Plan The patient's preventative maintenance and recommended screening tests for an annual wellness exam were reviewed in full today. Brought up to date unless services declined.  Counselled on the importance of diet, exercise, and its role in overall health and mortality. The patient's FH and SH was reviewed, including their home life, tobacco status, and drug and alcohol status.    Vaccines:  COVID, PNA, flu and Td uptodate, refuses shingles vaccine...says he has never had chicken pox   Former smoker.Quit 34 years ago.   Colon: 06/2018 Dr. Sandrea Cruel, tubular adenoma, repeat 5 years  if healthy. Prostate: not indicated after age 28.    Has had yearly eye exam.  Problem List  Items Addressed This Visit     Cancer of parotid gland (HCC) (Chronic)   LEFT parotid gland carcinoma s/p resection/LND followed by adjuvant RT in 2022 (70Gy) now with LEFT neck recurrence in level IV s/p neck dissection  03/2023(2.3cm, invades jugular vein, positive soft tissue margin) .  Reviewed last OV 05/08/2023 from [facility] Dr. Ruthy Cox.. Rad ONC... adjuvant radiation x 5 weeks      Diabetic neuropathy (HCC) (Chronic)   Chronic, stable control, minimally symptomatic  Secondary to diabetes      Relevant Medications   metFORMIN  (GLUCOPHAGE -XR) 500 MG 24 hr tablet   Essential hypertension (Chronic)   Stable, chronic.  Continue current medication.  HCTZ 25 mg daily      Hyperlipidemia associated with type 2 diabetes mellitus (HCC) (Chronic)   Stable, chronic.  Continue current medication.  LDL at goal less than 70  Crestor  20 mg daily  fenofibrate  160 mg daily      Relevant Medications   metFORMIN  (GLUCOPHAGE -XR) 500 MG 24 hr tablet   Type 2 diabetes mellitus with neurological complications (HCC) (Chronic)   Stable, chronic.  Continue current medication.  Discussed possibly change metformin  to Jardiance given cardiovascular benefit.  He will discuss with his daughter and let me know.  Metformin  500 mg  (changed to XR) for compliance daily      Relevant Medications   metFORMIN  (GLUCOPHAGE -XR) 500 MG 24 hr tablet   Other Visit Diagnoses       Medicare annual wellness visit, subsequent    -  Primary     Colon cancer screening       Relevant Orders   Ambulatory referral to Gastroenterology       Herby Lolling, MD

## 2023-11-15 NOTE — Assessment & Plan Note (Signed)
Chronic, stable control, minimally symptomatic  Secondary to diabetes

## 2023-11-15 NOTE — Assessment & Plan Note (Signed)
Stable, chronic.  Continue current medication.  HCTZ 25 mg daily 

## 2023-11-22 DIAGNOSIS — H40153 Residual stage of open-angle glaucoma, bilateral: Secondary | ICD-10-CM | POA: Diagnosis not present

## 2023-11-28 ENCOUNTER — Telehealth: Payer: Self-pay | Admitting: Family Medicine

## 2023-11-28 NOTE — Telephone Encounter (Signed)
 Spoke with pt. He states that he has been having issues with having a bowel movement. Reports that he hasn't had a good one in over 2-3 weeks ago. Has been taking Miralax every now and then but it has not helped. Pt is requesting recommendations.

## 2023-11-28 NOTE — Telephone Encounter (Signed)
 Copied from CRM [id number]. Topic: General - Other >> Nov 28, 2023  9:21 AM Miguel Bradley I wrote: Reason for CRM:  Patient has a medical question for dr. Annice Kim nurse and would like a call to discuss.

## 2023-11-30 NOTE — Telephone Encounter (Signed)
 Spoke with patient and recommendations given to patient. Patient verbalized understanding.

## 2023-12-03 ENCOUNTER — Encounter (INDEPENDENT_AMBULATORY_CARE_PROVIDER_SITE_OTHER): Payer: PPO | Admitting: Ophthalmology

## 2023-12-11 ENCOUNTER — Telehealth: Payer: Self-pay | Admitting: *Deleted

## 2023-12-11 NOTE — Telephone Encounter (Signed)
 Noted. Will see patient.

## 2023-12-11 NOTE — Telephone Encounter (Signed)
 I spoke with pt; pt said starting 2 wks ago on and off has burning sensation on lt side of chest under lt collarbone. Pt said no pattern to having pain. Pt said she can be sitting or moving around. Eating does not make a difference if has pain or not. Has burning sensation on and off everyday for past 2 wks but episode only last one minute or less with no radiation of pain. Pt said last episode of burning pain lt side of chest was earlier this morning. No pain now. Pt does not have any SOB,H/A or unusual dizziness. Pt has not cked recent BP but pt said last BP was OK (does not remember actual BP reading).Pt can do usual daily activities. Pt said in no distress and does not need to go to ED now. Offered pt appt this afternoon with provider at [facility] but pt wants to see Dr Avelina. Pt scheduled appt with Dr Avelina on 12/12/23 at 12 noon with UC & ED precautions and pt voiced understanding and pt appreciated call.sending note to Dr Avelina and [place] pool;.

## 2023-12-11 NOTE — Telephone Encounter (Signed)
 Copied from CRM [id number]. Topic: Clinical - Medical Advice >> Dec 11, 2023 10:27 AM Miguel Bradley wrote: Reason for CRM: Patient is calling in stating he would like Amy's nurse to give him a call back. Patient states he has a coming and going burning pain in his chest under his collarbone on the left side.

## 2023-12-12 ENCOUNTER — Encounter: Payer: Self-pay | Admitting: Family Medicine

## 2023-12-12 ENCOUNTER — Ambulatory Visit (INDEPENDENT_AMBULATORY_CARE_PROVIDER_SITE_OTHER): Admitting: Family Medicine

## 2023-12-12 VITALS — BP 124/62 | HR 62 | Temp 97.6°F | Ht 69.0 in | Wt 195.8 lb

## 2023-12-12 DIAGNOSIS — R0789 Other chest pain: Secondary | ICD-10-CM | POA: Diagnosis not present

## 2023-12-12 NOTE — Progress Notes (Signed)
 Patient ID: Miguel Bradley, male    DOB: August 05, 1940, 83 y.o.   MRN: [id number]  This visit was conducted in person.  BP 124/62   Pulse 62   Temp 97.6 F (36.4 C) (Oral)   Ht 5' 9 (1.753 m)   Wt 195 lb 12.8 oz (88.8 kg)   SpO2 96%   BMI 28.91 kg/m    CC:  Chief Complaint  Patient presents with   Burning in Chest    Ongoing for about two weeks. Pain when it burns. But if he pats the area it goes away. Located in upper left chest area. Under collarbone radiates under his arm. He had radiation in the past in that area    Subjective:   HPI: Miguel Bradley is a 83 y.o. male presenting on 12/12/2023 for Burning in Chest (Ongoing for about two weeks. Pain when it burns. But if he pats the area it goes away. Located in upper left chest area. Under collarbone radiates under his arm. He had radiation in the past in that area)   In the last 2 weeks he has noted intermittent left upper chest pain,  at rest.  No associated symptoms.  Pain is in left chest under collarbone and radiates to left axillae.  Slightly sore to touch.   Describes it as a burning in chest that is better with patting the area.  No skin rash  No SOB.   No falls.  No new medications.   Never had Chickenpox.   Decreased mobility in eft shoulder since neck surgery for  parotid CA in 01/2023   No neck pain, some stiffness in upper neck, no radiculopathy and, no numbness in left hand.  Relevant past medical, surgical, family and social history reviewed and updated as indicated. Interim medical history since our last visit reviewed. Allergies and medications reviewed and updated. Outpatient Medications Prior to Visit  Medication Sig Dispense Refill   aspirin  EC 81 MG tablet Take 81 mg by mouth daily.     Continuous Glucose Sensor (FREESTYLE LIBRE 3 PLUS SENSOR) MISC Use to check blood sugar continuously. Change sensor every 15 days. 6 each 1   diphenhydramine-acetaminophen  (TYLENOL  PM) 25-500 MG TABS tablet Take 2  tablets by mouth at bedtime.     dorzolamide-timolol (COSOPT) 22.3-6.8 MG/ML ophthalmic solution Place 1 drop into both eyes 2 (two) times daily.      fenofibrate  160 MG tablet TAKE 1 TABLET BY MOUTH EVERY DAY 90 tablet 3   hydrochlorothiazide  (HYDRODIURIL ) 25 MG tablet TAKE 1 TABLET BY MOUTH EVERY DAY 90 tablet 3   latanoprost (XALATAN) 0.005 % ophthalmic solution Place 1 drop into both eyes at bedtime.      metFORMIN  (GLUCOPHAGE -XR) 500 MG 24 hr tablet Take 1 tablet (500 mg total) by mouth daily with breakfast. 30 tablet 11   Multiple Vitamin (MULTIVITAMIN) tablet Take 1 tablet by mouth daily.     nitroGLYCERIN  (NITROSTAT ) 0.4 MG SL tablet Place 1 tablet (0.4 mg total) under the tongue every 5 (five) minutes as needed for chest pain. 25 tablet prn   Omega-3 Fatty Acids (FISH OIL) 1000 MG CAPS Take 1 capsule by mouth daily.     rosuvastatin  (CRESTOR ) 20 MG tablet TAKE 1 TABLET BY MOUTH EVERY DAY 90 tablet 1   No facility-administered medications prior to visit.     Per HPI unless specifically indicated in ROS section below Review of Systems  Constitutional:  Negative for fatigue and fever.  HENT:  Negative for ear pain.   Eyes:  Negative for pain.  Respiratory:  Negative for cough and shortness of breath.   Cardiovascular:  Negative for chest pain, palpitations and leg swelling.  Gastrointestinal:  Positive for constipation. Negative for abdominal pain.  Genitourinary:  Negative for dysuria.  Musculoskeletal:  Negative for arthralgias.  Neurological:  Negative for syncope, light-headedness and headaches.  Psychiatric/Behavioral:  Negative for dysphoric mood.    Objective:  BP 124/62   Pulse 62   Temp 97.6 F (36.4 C) (Oral)   Ht 5' 9 (1.753 m)   Wt 195 lb 12.8 oz (88.8 kg)   SpO2 96%   BMI 28.91 kg/m   Wt Readings from Last 3 Encounters:  12/12/23 195 lb 12.8 oz (88.8 kg)  11/15/23 191 lb 6 oz (86.8 kg)  10/10/23 190 lb 12.8 oz (86.5 kg)    EKG:  NSR, no ST changes, no  LVH, no change from 10/10/2023   Physical Exam Vitals reviewed.  Constitutional:      Appearance: He is well-developed.  HENT:     Head: Normocephalic.     Right Ear: Hearing normal.     Left Ear: Hearing normal.     Nose: Nose normal.  Neck:     Thyroid : No thyroid  mass or thyromegaly.     Vascular: No carotid bruit.     Trachea: Trachea normal.     Comments: Negative Spurling's bilaterally Cardiovascular:     Rate and Rhythm: Normal rate and regular rhythm.     Pulses: Normal pulses.     Heart sounds: Heart sounds not distant. Murmur heard.     No friction rub. No gallop.     Comments: No peripheral edema Pulmonary:     Effort: Pulmonary effort is normal. No respiratory distress.     Breath sounds: Normal breath sounds.  Musculoskeletal:     Left shoulder: No swelling or tenderness. Decreased range of motion. Normal strength. Normal pulse.     Cervical back: Full passive range of motion without pain.  Lymphadenopathy:     Cervical: No cervical adenopathy.     Right cervical: No superficial, deep or posterior cervical adenopathy.    Left cervical: No superficial, deep or posterior cervical adenopathy.  Skin:    General: Skin is warm and dry.     Findings: No signs of injury, lesion or rash.     Comments:  No current hypersensitivity of skin  Neurological:     Cranial Nerves: Cranial nerve deficit present.     Sensory: Sensation is intact.     Motor: Motor function is intact.     Gait: Gait is intact.     Comments:  No new deficit.SABRA old changes post op  Psychiatric:        Speech: Speech normal.        Behavior: Behavior normal.        Thought Content: Thought content normal.       Results for orders placed or performed in visit on 11/15/23  HM DIABETES FOOT EXAM   Collection Time: 11/15/23 12:00 AM  Result Value Ref Range   HM Diabetic Foot Exam done     Assessment and Plan  Atypical chest pain Assessment & Plan: Acute, atypical for cardiopulmonary  source. Symptoms correspond most with neuropathic source.  Possible atypical shingles versus irritation of nerves in thoracic spine. Pain has improved today. Discussed possible treatment options.  He can use Tylenol  and stretching of left shoulder and upper  back.  If pain returns or becomes more persistent we can look into imaging of the neck. He does have significant postsurgical changes in left neck that potentially may have caused nerve irritation although he is almost a year out from most recent surgery. No clear medication causing side effect.   EKG unremarkable and unchanged from April 2025.   Orders: -     EKG 12-Lead    No follow-ups on file.   Greig Ring, MD

## 2023-12-12 NOTE — Assessment & Plan Note (Signed)
 Acute, atypical for cardiopulmonary source. Symptoms correspond most with neuropathic source.  Possible atypical shingles versus irritation of nerves in thoracic spine. Pain has improved today. Discussed possible treatment options.  He can use Tylenol  and stretching of left shoulder and upper back.  If pain returns or becomes more persistent we can look into imaging of the neck. He does have significant postsurgical changes in left neck that potentially may have caused nerve irritation although he is almost a year out from most recent surgery. No clear medication causing side effect.   EKG unremarkable and unchanged from April 2025.

## 2023-12-13 ENCOUNTER — Encounter: Payer: Self-pay | Admitting: Family Medicine

## 2023-12-14 ENCOUNTER — Other Ambulatory Visit: Payer: Self-pay | Admitting: Family Medicine

## 2023-12-31 ENCOUNTER — Encounter (INDEPENDENT_AMBULATORY_CARE_PROVIDER_SITE_OTHER): Admitting: Ophthalmology

## 2024-01-02 DIAGNOSIS — C07 Malignant neoplasm of parotid gland: Secondary | ICD-10-CM | POA: Diagnosis not present

## 2024-01-03 ENCOUNTER — Encounter (INDEPENDENT_AMBULATORY_CARE_PROVIDER_SITE_OTHER): Admitting: Ophthalmology

## 2024-01-03 DIAGNOSIS — H35372 Puckering of macula, left eye: Secondary | ICD-10-CM

## 2024-01-03 DIAGNOSIS — H35033 Hypertensive retinopathy, bilateral: Secondary | ICD-10-CM | POA: Diagnosis not present

## 2024-01-03 DIAGNOSIS — H43813 Vitreous degeneration, bilateral: Secondary | ICD-10-CM

## 2024-01-03 DIAGNOSIS — I10 Essential (primary) hypertension: Secondary | ICD-10-CM

## 2024-01-07 ENCOUNTER — Encounter: Payer: Self-pay | Admitting: Family Medicine

## 2024-01-10 ENCOUNTER — Encounter: Payer: Self-pay | Admitting: Family Medicine

## 2024-01-10 ENCOUNTER — Ambulatory Visit (INDEPENDENT_AMBULATORY_CARE_PROVIDER_SITE_OTHER): Admitting: Family Medicine

## 2024-01-10 VITALS — BP 130/74 | HR 48 | Temp 98.3°F | Ht 69.0 in | Wt 195.0 lb

## 2024-01-10 DIAGNOSIS — R42 Dizziness and giddiness: Secondary | ICD-10-CM | POA: Diagnosis not present

## 2024-01-10 DIAGNOSIS — R413 Other amnesia: Secondary | ICD-10-CM

## 2024-01-10 MED ORDER — DONEPEZIL HCL 5 MG PO TBDP: 5.0000 mg | ORAL_TABLET | Freq: Every day | ORAL | 11 refills | Status: AC

## 2024-01-10 NOTE — Patient Instructions (Signed)
 Follow up if vertigo not improving with home exercsies.  Start Aricept .

## 2024-01-10 NOTE — Progress Notes (Unsigned)
 Patient ID: Miguel Bradley, male    DOB: 11-01-1940, 83 y.o.   MRN: [id number]  This visit was conducted in person.  BP 130/74   Pulse (!) 48   Temp 98.3 F (36.8 C) (Temporal)   Ht 5' 9 (1.753 m)   Wt 195 lb (88.5 kg)   SpO2 97%   BMI 28.80 kg/m    CC:  Chief Complaint  Patient presents with  . Memory Loss    Subjective:   HPI: Miguel Bradley is a 83 y.o. male presenting on 01/10/2024 for Memory Loss  Memory loss.. gradually worsening over the past 2 years    Presents with  his daughter Miguel Bradley.  Daughter noted he forgets seeing her, conversations etc.  Does not forget meds,  forgetting where he puts things.    He is wondering about taking Prevagen.  No new meds... he does take  meclizine for vertigo... not regularly.  Has noted room spinning trigger by moving head swiftly.    He denies depression, anxiety. Sleeping 6-8 hours a night.   Mother had dementia.     10/2023 B12, vit D, TSH nml on supplement  Relevant past medical, surgical, family and social history reviewed and updated as indicated. Interim medical history since our last visit reviewed. Allergies and medications reviewed and updated. Outpatient Medications Prior to Visit  Medication Sig Dispense Refill  . aspirin  EC 81 MG tablet Take 81 mg by mouth daily.    . Continuous Glucose Sensor (FREESTYLE LIBRE 3 PLUS SENSOR) MISC Use to check blood sugar continuously. Change sensor every 15 days. 6 each 1  . cyanocobalamin  (VITAMIN B12) 1000 MCG tablet Take 1,000 mcg by mouth daily.    . diphenhydramine-acetaminophen  (TYLENOL  PM) 25-500 MG TABS tablet Take 2 tablets by mouth at bedtime.    . dorzolamide-timolol (COSOPT) 22.3-6.8 MG/ML ophthalmic solution Place 1 drop into both eyes 2 (two) times daily.     . fenofibrate  160 MG tablet TAKE 1 TABLET BY MOUTH EVERY DAY 90 tablet 3  . hydrochlorothiazide  (HYDRODIURIL ) 25 MG tablet TAKE 1 TABLET BY MOUTH EVERY DAY 90 tablet 3  . latanoprost (XALATAN) 0.005 %  ophthalmic solution Place 1 drop into both eyes at bedtime.     . metFORMIN  (GLUCOPHAGE -XR) 500 MG 24 hr tablet Take 1 tablet (500 mg total) by mouth daily with breakfast. 30 tablet 11  . Multiple Vitamin (MULTIVITAMIN) tablet Take 1 tablet by mouth daily.    . nitroGLYCERIN  (NITROSTAT ) 0.4 MG SL tablet Place 1 tablet (0.4 mg total) under the tongue every 5 (five) minutes as needed for chest pain. 25 tablet prn  . Omega-3 Fatty Acids (FISH OIL) 1000 MG CAPS Take 1 capsule by mouth daily.    . rosuvastatin  (CRESTOR ) 20 MG tablet TAKE 1 TABLET BY MOUTH EVERY DAY 90 tablet 3   No facility-administered medications prior to visit.     Per HPI unless specifically indicated in ROS section below Review of Systems Objective:  BP 130/74   Pulse (!) 48   Temp 98.3 F (36.8 C) (Temporal)   Ht 5' 9 (1.753 m)   Wt 195 lb (88.5 kg)   SpO2 97%   BMI 28.80 kg/m   Wt Readings from Last 3 Encounters:  01/10/24 195 lb (88.5 kg)  12/12/23 195 lb 12.8 oz (88.8 kg)  11/15/23 191 lb 6 oz (86.8 kg)      Physical Exam   MMSE 27/30 , nml clock drawing  Animal recall  10/15 sec Results for orders placed or performed in visit on 11/15/23  HM DIABETES FOOT EXAM   Collection Time: 11/15/23 12:00 AM  Result Value Ref Range   HM Diabetic Foot Exam done     Assessment and Plan  There are no diagnoses linked to this encounter.  No follow-ups on file.   Greig Ring, MD

## 2024-01-11 DIAGNOSIS — R42 Dizziness and giddiness: Secondary | ICD-10-CM | POA: Insufficient documentation

## 2024-01-11 DIAGNOSIS — R413 Other amnesia: Secondary | ICD-10-CM | POA: Insufficient documentation

## 2024-01-11 NOTE — Assessment & Plan Note (Signed)
 Chronic, gradual worsening.  No clear sign of acute CVA. No clear medication cause. Past lab evaluation for secondary causes was unremarkable. Mother with dementia possibly Alzheimer's dementia.  Mini-Mental status exam today 27 out of 30, normal clock drawing, animal recall 10 in15 seconds. Recommended starting Aricept  5 mg daily.  Follow-up in 3 months for repeat Mini-Mental status exam. Also offered consideration of imaging versus neurology referral but given symptoms are mild at this point with normal neuroexam he is not interested in moving forward.

## 2024-01-11 NOTE — Assessment & Plan Note (Signed)
 Acute, intermittent Symptoms most consistent with BPPV. He can use meclizine but I encouraged him to utilize use it in a limited fashion given possible sedation/memory loss side effects. Given him home exercises to desensitize inner ear.  If symptoms not improving as expected we will consider referral to PT for balance retraining/vertigo treatment.

## 2024-01-16 DIAGNOSIS — E1140 Type 2 diabetes mellitus with diabetic neuropathy, unspecified: Secondary | ICD-10-CM | POA: Diagnosis not present

## 2024-01-16 DIAGNOSIS — C089 Malignant neoplasm of major salivary gland, unspecified: Secondary | ICD-10-CM | POA: Diagnosis not present

## 2024-01-16 DIAGNOSIS — Z79899 Other long term (current) drug therapy: Secondary | ICD-10-CM | POA: Diagnosis not present

## 2024-01-16 DIAGNOSIS — N189 Chronic kidney disease, unspecified: Secondary | ICD-10-CM | POA: Diagnosis not present

## 2024-01-16 DIAGNOSIS — E11319 Type 2 diabetes mellitus with unspecified diabetic retinopathy without macular edema: Secondary | ICD-10-CM | POA: Diagnosis not present

## 2024-01-16 DIAGNOSIS — K5900 Constipation, unspecified: Secondary | ICD-10-CM | POA: Diagnosis not present

## 2024-01-16 DIAGNOSIS — I2510 Atherosclerotic heart disease of native coronary artery without angina pectoris: Secondary | ICD-10-CM | POA: Diagnosis not present

## 2024-01-16 DIAGNOSIS — E1122 Type 2 diabetes mellitus with diabetic chronic kidney disease: Secondary | ICD-10-CM | POA: Diagnosis not present

## 2024-01-16 DIAGNOSIS — Z7984 Long term (current) use of oral hypoglycemic drugs: Secondary | ICD-10-CM | POA: Diagnosis not present

## 2024-01-16 DIAGNOSIS — C760 Malignant neoplasm of head, face and neck: Secondary | ICD-10-CM | POA: Diagnosis not present

## 2024-01-16 DIAGNOSIS — C07 Malignant neoplasm of parotid gland: Secondary | ICD-10-CM | POA: Diagnosis not present

## 2024-01-16 DIAGNOSIS — E785 Hyperlipidemia, unspecified: Secondary | ICD-10-CM | POA: Diagnosis not present

## 2024-01-16 DIAGNOSIS — M542 Cervicalgia: Secondary | ICD-10-CM | POA: Diagnosis not present

## 2024-01-16 DIAGNOSIS — I129 Hypertensive chronic kidney disease with stage 1 through stage 4 chronic kidney disease, or unspecified chronic kidney disease: Secondary | ICD-10-CM | POA: Diagnosis not present

## 2024-01-16 DIAGNOSIS — Z7982 Long term (current) use of aspirin: Secondary | ICD-10-CM | POA: Diagnosis not present

## 2024-01-16 DIAGNOSIS — R200 Anesthesia of skin: Secondary | ICD-10-CM | POA: Diagnosis not present

## 2024-01-16 DIAGNOSIS — Z923 Personal history of irradiation: Secondary | ICD-10-CM | POA: Diagnosis not present

## 2024-01-16 DIAGNOSIS — C50922 Malignant neoplasm of unspecified site of left male breast: Secondary | ICD-10-CM | POA: Diagnosis not present

## 2024-01-24 ENCOUNTER — Other Ambulatory Visit: Payer: Self-pay | Admitting: Family Medicine

## 2024-01-30 ENCOUNTER — Other Ambulatory Visit: Payer: Self-pay | Admitting: Family Medicine

## 2024-02-07 DIAGNOSIS — C07 Malignant neoplasm of parotid gland: Secondary | ICD-10-CM | POA: Diagnosis not present

## 2024-02-13 DIAGNOSIS — C44329 Squamous cell carcinoma of skin of other parts of face: Secondary | ICD-10-CM | POA: Diagnosis not present

## 2024-02-13 DIAGNOSIS — D485 Neoplasm of uncertain behavior of skin: Secondary | ICD-10-CM | POA: Diagnosis not present

## 2024-04-02 DIAGNOSIS — C07 Malignant neoplasm of parotid gland: Secondary | ICD-10-CM | POA: Diagnosis not present

## 2024-04-02 DIAGNOSIS — C089 Malignant neoplasm of major salivary gland, unspecified: Secondary | ICD-10-CM | POA: Diagnosis not present

## 2024-04-10 DIAGNOSIS — H40153 Residual stage of open-angle glaucoma, bilateral: Secondary | ICD-10-CM | POA: Diagnosis not present

## 2024-04-16 DIAGNOSIS — C44329 Squamous cell carcinoma of skin of other parts of face: Secondary | ICD-10-CM | POA: Diagnosis not present

## 2024-04-16 DIAGNOSIS — D0439 Carcinoma in situ of skin of other parts of face: Secondary | ICD-10-CM | POA: Diagnosis not present

## 2024-05-16 ENCOUNTER — Ambulatory Visit: Admitting: Family Medicine

## 2024-05-22 ENCOUNTER — Ambulatory Visit: Admitting: Family Medicine

## 2024-05-22 ENCOUNTER — Encounter: Payer: Self-pay | Admitting: Family Medicine

## 2024-05-22 VITALS — BP 116/84 | HR 64 | Temp 98.7°F | Ht 69.0 in | Wt 201.6 lb

## 2024-05-22 DIAGNOSIS — E1149 Type 2 diabetes mellitus with other diabetic neurological complication: Secondary | ICD-10-CM | POA: Diagnosis not present

## 2024-05-22 DIAGNOSIS — Z7984 Long term (current) use of oral hypoglycemic drugs: Secondary | ICD-10-CM | POA: Diagnosis not present

## 2024-05-22 DIAGNOSIS — E1142 Type 2 diabetes mellitus with diabetic polyneuropathy: Secondary | ICD-10-CM

## 2024-05-22 DIAGNOSIS — I10 Essential (primary) hypertension: Secondary | ICD-10-CM

## 2024-05-22 DIAGNOSIS — R413 Other amnesia: Secondary | ICD-10-CM

## 2024-05-22 LAB — POCT GLYCOSYLATED HEMOGLOBIN (HGB A1C): Hemoglobin A1C: 6.5 % — AB (ref 4.0–5.6)

## 2024-05-22 NOTE — Assessment & Plan Note (Signed)
Chronic, stable control, minimally symptomatic  Secondary to diabetes

## 2024-05-22 NOTE — Progress Notes (Signed)
 Patient ID: Miguel Bradley, male    DOB: 09-Jul-1940, 83 y.o.   MRN: [id number]  This visit was conducted in person.  BP 116/84 (BP Location: Right Arm, Patient Position: Sitting, Cuff Size: Large)   Pulse 64   Temp 98.7 F (37.1 C) (Temporal)   Ht 5' 9 (1.753 m)   Wt 201 lb 9.6 oz (91.4 kg)   SpO2 97%   BMI 29.77 kg/m    CC: Chief Complaint  Patient presents with   Diabetes    Subjective:   HPI: Miguel Bradley is a 83 y.o. 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  Reviewed last OV 05/08/2023 from [facility] Dr. Keane.. Rad ONC... adjuvant radiation recommended x 5 weeks   Now living at retirement home:   Hx of diabetic neuropathy  Having trouble sleeping at night.. legs feel heavy, numb. No burning.  Has been trying lotion.  Feels better with getting up an moving legs. No issues during the day  Wants to start magnesium gummies.   Memory loss, mother with Alzheimer's.  Now on Aricept  5 mg daily. He feels memory has been stable in last 3 month.  Hypertension:    At goal on HCTZ 25 mg daily BP Readings from Last 3 Encounters:  05/22/24 116/84  01/10/24 130/74  12/12/23 124/62  Using medication without problems or lightheadedness:  none Chest pain with exertion: none Edema:none Short of breath: some after surgery... but felt likely due to recent surgery... surgeon say likely to resolve. Average home BPs: Other issues:  Diabetes:    Good  control with  lifestyle change/ overall weight loss, on metformin  500 mg  XR daily Lab Results  Component Value Date   HGBA1C 6.5 (A) 05/22/2024  Using medications without difficulties: Hypoglycemic episodes: Hyperglycemic episodes: Feet problems: none Blood Sugars averaging:  CGM eye exam within last year:  yes     Wt Readings from Last 3 Encounters:  05/22/24 201 lb 9.6 oz (91.4 kg)  01/10/24 195 lb (88.5 kg)  12/12/23 195 lb 12.8 oz (88.8 kg)    Relevant past medical, surgical, family and social history reviewed and updated as indicated. Interim medical history since our last visit reviewed. Allergies and medications reviewed and updated. Outpatient Medications Prior to Visit  Medication Sig Dispense Refill   aspirin  EC 81 MG tablet Take 81 mg by mouth daily.     Continuous Glucose Sensor (FREESTYLE LIBRE 3 PLUS SENSOR) MISC USE TO CHECK BLOOD SUGAR CONTINUOUSLY. CHANGE SENSOR EVERY 15 DAYS. 6 each 3   cyanocobalamin  (VITAMIN B12) 1000 MCG tablet Take 1,000 mcg by mouth daily.     diphenhydramine-acetaminophen  (TYLENOL  PM) 25-500 MG TABS tablet Take 2 tablets by mouth at bedtime.     donepezil  (ARICEPT  ODT) 5 MG disintegrating tablet Take 1 tablet (5 mg total) by mouth at bedtime. 30 tablet 11   dorzolamide-timolol (COSOPT) 22.3-6.8 MG/ML ophthalmic solution Place 1 drop into both eyes 2 (two) times daily.      fenofibrate  160 MG tablet TAKE 1 TABLET BY MOUTH EVERY DAY 90 tablet 3   hydrochlorothiazide  (HYDRODIURIL ) 25 MG tablet TAKE 1 TABLET BY MOUTH EVERY DAY 90 tablet 3   latanoprost (XALATAN) 0.005 % ophthalmic solution Place 1 drop into both eyes at bedtime.  metFORMIN  (GLUCOPHAGE -XR) 500 MG 24 hr tablet Take 1 tablet (500 mg total) by mouth daily with breakfast. 30 tablet 11   Multiple Vitamin (MULTIVITAMIN) tablet Take 1 tablet by mouth daily.     nitroGLYCERIN  (NITROSTAT ) 0.4 MG SL tablet Place 1 tablet (0.4 mg total) under the tongue every 5 (five) minutes as needed for chest pain. 25 tablet prn   Omega-3 Fatty Acids (FISH OIL) 1000 MG CAPS Take 1 capsule by mouth daily.     rosuvastatin  (CRESTOR ) 20 MG tablet TAKE 1 TABLET BY MOUTH EVERY DAY 90 tablet 3   No facility-administered medications prior to visit.     Per HPI unless specifically indicated in ROS section  below Review of Systems  Constitutional:  Negative for fatigue and fever.  HENT:  Negative for ear pain.   Eyes:  Negative for pain.  Respiratory:  Negative for cough and shortness of breath.   Cardiovascular:  Negative for chest pain, palpitations and leg swelling.  Gastrointestinal:  Negative for abdominal pain.  Genitourinary:  Negative for dysuria.  Musculoskeletal:  Negative for arthralgias.  Neurological:  Negative for syncope, light-headedness and headaches.  Psychiatric/Behavioral:  Negative for dysphoric mood.    Objective:  BP 116/84 (BP Location: Right Arm, Patient Position: Sitting, Cuff Size: Large)   Pulse 64   Temp 98.7 F (37.1 C) (Temporal)   Ht 5' 9 (1.753 m)   Wt 201 lb 9.6 oz (91.4 kg)   SpO2 97%   BMI 29.77 kg/m   Wt Readings from Last 3 Encounters:  05/22/24 201 lb 9.6 oz (91.4 kg)  01/10/24 195 lb (88.5 kg)  12/12/23 195 lb 12.8 oz (88.8 kg)      Physical Exam Constitutional:      General: He is not in acute distress.    Appearance: Normal appearance. He is well-developed. He is not ill-appearing or toxic-appearing.  HENT:     Head: Normocephalic and atraumatic.     Comments: Post surgical changes to left side of face.. cannot fully close left eye, lip droop and some trouble breathing though ;eft nostril    Right Ear: Hearing, tympanic membrane, ear canal and external ear normal.     Left Ear: Hearing, tympanic membrane, ear canal and external ear normal.     Nose: Nose normal.     Mouth/Throat:     Pharynx: Uvula midline.  Eyes:     General: Lids are normal. Lids are everted, no foreign bodies appreciated.     Conjunctiva/sclera: Conjunctivae normal.     Pupils: Pupils are equal, round, and reactive to light.  Neck:     Thyroid : No thyroid  mass or thyromegaly.     Vascular: No carotid bruit.     Trachea: Trachea and phonation normal.     Comments: Abnormal musculature of left neck from surgical changes. Cardiovascular:     Rate and Rhythm:  Normal rate and regular rhythm.     Pulses: Normal pulses.     Heart sounds: S1 normal and S2 normal. Heart sounds not distant. No murmur heard.    No friction rub. No gallop.     Comments: No peripheral edema Pulmonary:     Effort: Pulmonary effort is normal. No respiratory distress.     Breath sounds: Normal breath sounds. No wheezing, rhonchi or rales.  Abdominal:     General: Bowel sounds are normal.     Palpations: Abdomen is soft.     Tenderness: There is no abdominal tenderness. There is no  guarding or rebound.     Hernia: No hernia is present.  Musculoskeletal:     Cervical back: Normal range of motion and neck supple.  Lymphadenopathy:     Cervical: No cervical adenopathy.  Skin:    General: Skin is warm and dry.     Findings: No rash.  Neurological:     Mental Status: He is alert.     Cranial Nerves: No cranial nerve deficit.     Sensory: No sensory deficit.     Gait: Gait normal.     Deep Tendon Reflexes: Reflexes are normal and symmetric.  Psychiatric:        Speech: Speech normal.        Behavior: Behavior normal.        Thought Content: Thought content normal.        Judgment: Judgment normal.        Results for orders placed or performed in visit on 05/22/24  POCT glycosylated hemoglobin (Hb A1C)   Collection Time: 05/22/24 11:29 AM  Result Value Ref Range   Hemoglobin A1C 6.5 (A) 4.0 - 5.6 %   HbA1c POC (<> result, manual entry)     HbA1c, POC (prediabetic range)     HbA1c, POC (controlled diabetic range)       COVID 19 screen:  No recent travel or known exposure to COVID19 The patient denies respiratory symptoms of COVID 19 at this time. The importance of social distancing was discussed today.   Assessment and Plan  Problem List Items Addressed This Visit     Diabetic neuropathy (HCC) (Chronic)   Chronic, stable control, minimally symptomatic  Secondary to diabetes      Essential hypertension (Chronic)   Stable, chronic.  Continue current  medication.  HCTZ 25 mg daily      Memory loss   Chronic,  stable in last 3 months after at last office visit started Aricept  5 mg daily.      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  Continue current medication.  Discussed possibly change metformin  to Jardiance given cardiovascular benefit.  He will discuss with his daughter and let me know.  Metformin  500 mg  (changed to XR) for compliance daily      Relevant Orders   POCT glycosylated hemoglobin (Hb A1C) (Completed)    Miguel Ring, MD

## 2024-05-22 NOTE — Assessment & Plan Note (Signed)
Stable, chronic.  Continue current medication.  HCTZ 25 mg daily 

## 2024-05-22 NOTE — Assessment & Plan Note (Signed)
 Stable, chronic.  Continue current medication.  Discussed possibly change metformin  to Jardiance given cardiovascular benefit.  He will discuss with his daughter and let me know.  Metformin  500 mg  (changed to XR) for compliance daily

## 2024-05-22 NOTE — Assessment & Plan Note (Addendum)
 Chronic,  stable in last 3 months after at last office visit started Aricept  5 mg daily.

## 2024-12-30 ENCOUNTER — Encounter (INDEPENDENT_AMBULATORY_CARE_PROVIDER_SITE_OTHER): Admitting: Ophthalmology
